# Patient Record
Sex: Male | Born: 1970 | Race: White | Hispanic: No | Marital: Married | Smoking: Former smoker
Health system: Southern US, Community
[De-identification: ages and names within clinical notes are randomized; demographics above are authoritative.]

## PROBLEM LIST (undated history)

## (undated) DIAGNOSIS — F1011 Alcohol abuse, in remission: Secondary | ICD-10-CM

## (undated) DIAGNOSIS — K219 Gastro-esophageal reflux disease without esophagitis: Secondary | ICD-10-CM

## (undated) DIAGNOSIS — Z765 Malingerer [conscious simulation]: Secondary | ICD-10-CM

## (undated) DIAGNOSIS — R569 Unspecified convulsions: Secondary | ICD-10-CM

## (undated) DIAGNOSIS — R7989 Other specified abnormal findings of blood chemistry: Secondary | ICD-10-CM

## (undated) DIAGNOSIS — G8929 Other chronic pain: Secondary | ICD-10-CM

## (undated) DIAGNOSIS — M549 Dorsalgia, unspecified: Secondary | ICD-10-CM

## (undated) DIAGNOSIS — F319 Bipolar disorder, unspecified: Secondary | ICD-10-CM

## (undated) DIAGNOSIS — F1110 Opioid abuse, uncomplicated: Secondary | ICD-10-CM

## (undated) DIAGNOSIS — F1120 Opioid dependence, uncomplicated: Secondary | ICD-10-CM

## (undated) DIAGNOSIS — F4310 Post-traumatic stress disorder, unspecified: Secondary | ICD-10-CM

## (undated) DIAGNOSIS — I509 Heart failure, unspecified: Secondary | ICD-10-CM

## (undated) DIAGNOSIS — I10 Essential (primary) hypertension: Secondary | ICD-10-CM

## (undated) DIAGNOSIS — T401X1A Poisoning by heroin, accidental (unintentional), initial encounter: Secondary | ICD-10-CM

## (undated) DIAGNOSIS — F419 Anxiety disorder, unspecified: Secondary | ICD-10-CM

## (undated) DIAGNOSIS — F329 Major depressive disorder, single episode, unspecified: Secondary | ICD-10-CM

## (undated) DIAGNOSIS — B1920 Unspecified viral hepatitis C without hepatic coma: Secondary | ICD-10-CM

## (undated) DIAGNOSIS — I219 Acute myocardial infarction, unspecified: Secondary | ICD-10-CM

## (undated) DIAGNOSIS — F32A Depression, unspecified: Secondary | ICD-10-CM

## (undated) HISTORY — PX: APPENDECTOMY: SHX54

## (undated) HISTORY — PX: BACK SURGERY: SHX140

## (undated) HISTORY — PX: ABDOMINAL SURGERY: SHX537

---

## 2005-03-09 ENCOUNTER — Emergency Department (HOSPITAL_COMMUNITY): Admission: EM | Admit: 2005-03-09 | Discharge: 2005-03-10 | Payer: Self-pay | Admitting: Emergency Medicine

## 2008-01-22 ENCOUNTER — Emergency Department (HOSPITAL_COMMUNITY): Admission: EM | Admit: 2008-01-22 | Discharge: 2008-01-22 | Payer: Self-pay | Admitting: Emergency Medicine

## 2008-01-26 ENCOUNTER — Emergency Department (HOSPITAL_COMMUNITY): Admission: EM | Admit: 2008-01-26 | Discharge: 2008-01-26 | Payer: Self-pay | Admitting: Emergency Medicine

## 2008-02-14 ENCOUNTER — Emergency Department (HOSPITAL_COMMUNITY): Admission: EM | Admit: 2008-02-14 | Discharge: 2008-02-14 | Payer: Self-pay | Admitting: Emergency Medicine

## 2008-03-19 ENCOUNTER — Emergency Department (HOSPITAL_COMMUNITY): Admission: EM | Admit: 2008-03-19 | Discharge: 2008-03-19 | Payer: Self-pay | Admitting: Emergency Medicine

## 2008-04-26 ENCOUNTER — Emergency Department (HOSPITAL_COMMUNITY): Admission: EM | Admit: 2008-04-26 | Discharge: 2008-04-27 | Payer: Self-pay | Admitting: Emergency Medicine

## 2008-08-28 ENCOUNTER — Emergency Department (HOSPITAL_COMMUNITY): Admission: EM | Admit: 2008-08-28 | Discharge: 2008-08-28 | Payer: Self-pay | Admitting: Emergency Medicine

## 2009-01-05 ENCOUNTER — Emergency Department (HOSPITAL_COMMUNITY): Admission: EM | Admit: 2009-01-05 | Discharge: 2009-01-05 | Payer: Self-pay | Admitting: Emergency Medicine

## 2009-11-04 ENCOUNTER — Emergency Department (HOSPITAL_COMMUNITY): Admission: EM | Admit: 2009-11-04 | Discharge: 2009-11-05 | Payer: Self-pay | Admitting: Emergency Medicine

## 2009-11-18 ENCOUNTER — Emergency Department (HOSPITAL_COMMUNITY): Admission: EM | Admit: 2009-11-18 | Discharge: 2009-11-19 | Payer: Self-pay | Admitting: Emergency Medicine

## 2009-12-24 ENCOUNTER — Other Ambulatory Visit: Payer: Self-pay | Admitting: Emergency Medicine

## 2009-12-24 ENCOUNTER — Ambulatory Visit: Payer: Self-pay | Admitting: Psychiatry

## 2009-12-25 ENCOUNTER — Inpatient Hospital Stay (HOSPITAL_COMMUNITY): Admission: AD | Admit: 2009-12-25 | Discharge: 2010-01-06 | Payer: Self-pay | Admitting: Psychiatry

## 2010-02-11 ENCOUNTER — Emergency Department (HOSPITAL_COMMUNITY): Admission: EM | Admit: 2010-02-11 | Discharge: 2010-02-11 | Payer: Self-pay | Admitting: Emergency Medicine

## 2010-02-12 ENCOUNTER — Ambulatory Visit: Payer: Self-pay | Admitting: Psychiatry

## 2010-02-12 ENCOUNTER — Inpatient Hospital Stay (HOSPITAL_COMMUNITY): Admission: RE | Admit: 2010-02-12 | Discharge: 2010-02-13 | Payer: Self-pay | Admitting: Psychiatry

## 2010-02-13 ENCOUNTER — Ambulatory Visit: Payer: Self-pay | Admitting: Psychiatry

## 2010-02-13 ENCOUNTER — Inpatient Hospital Stay (HOSPITAL_COMMUNITY): Admission: EM | Admit: 2010-02-13 | Discharge: 2010-02-13 | Payer: Self-pay | Admitting: Emergency Medicine

## 2010-02-23 ENCOUNTER — Emergency Department (HOSPITAL_COMMUNITY): Admission: EM | Admit: 2010-02-23 | Discharge: 2010-02-23 | Payer: Self-pay | Admitting: Emergency Medicine

## 2010-02-25 ENCOUNTER — Emergency Department (HOSPITAL_COMMUNITY): Admission: EM | Admit: 2010-02-25 | Discharge: 2010-02-25 | Payer: Self-pay | Admitting: Emergency Medicine

## 2010-03-28 ENCOUNTER — Emergency Department (HOSPITAL_COMMUNITY): Admission: EM | Admit: 2010-03-28 | Discharge: 2010-03-29 | Payer: Self-pay | Admitting: Emergency Medicine

## 2010-04-08 ENCOUNTER — Emergency Department (HOSPITAL_COMMUNITY): Admission: EM | Admit: 2010-04-08 | Discharge: 2010-04-08 | Payer: Self-pay | Admitting: Emergency Medicine

## 2010-04-26 ENCOUNTER — Emergency Department (HOSPITAL_COMMUNITY): Admission: EM | Admit: 2010-04-26 | Discharge: 2010-04-26 | Payer: Self-pay | Admitting: Emergency Medicine

## 2010-09-29 ENCOUNTER — Emergency Department (HOSPITAL_COMMUNITY)
Admission: EM | Admit: 2010-09-29 | Discharge: 2010-09-29 | Disposition: A | Discharge status: Home / Self Care | Payer: Medicare Other | Attending: Emergency Medicine | Admitting: Emergency Medicine

## 2010-09-29 ENCOUNTER — Emergency Department (HOSPITAL_COMMUNITY): Payer: Medicare Other

## 2010-09-29 DIAGNOSIS — Z9889 Other specified postprocedural states: Secondary | ICD-10-CM | POA: Insufficient documentation

## 2010-09-29 DIAGNOSIS — M545 Low back pain, unspecified: Secondary | ICD-10-CM | POA: Insufficient documentation

## 2010-09-29 DIAGNOSIS — S335XXA Sprain of ligaments of lumbar spine, initial encounter: Secondary | ICD-10-CM | POA: Insufficient documentation

## 2010-10-23 LAB — URINE MICROSCOPIC-ADD ON

## 2010-10-23 LAB — URINALYSIS, ROUTINE W REFLEX MICROSCOPIC
Bilirubin Urine: NEGATIVE
Glucose, UA: NEGATIVE mg/dL
Ketones, ur: NEGATIVE mg/dL
Leukocytes, UA: NEGATIVE
Nitrite: NEGATIVE
Protein, ur: 30 mg/dL — AB
Specific Gravity, Urine: 1.023 (ref 1.005–1.030)
Urobilinogen, UA: 0.2 mg/dL (ref 0.0–1.0)
pH: 5.5 (ref 5.0–8.0)

## 2010-10-25 LAB — DIFFERENTIAL
Basophils Absolute: 0 10*3/uL (ref 0.0–0.1)
Basophils Absolute: 0 10*3/uL (ref 0.0–0.1)
Basophils Absolute: 0 10*3/uL (ref 0.0–0.1)
Basophils Relative: 1 % (ref 0–1)
Basophils Relative: 1 % (ref 0–1)
Basophils Relative: 1 % (ref 0–1)
Eosinophils Absolute: 0.2 10*3/uL (ref 0.0–0.7)
Eosinophils Absolute: 0.2 10*3/uL (ref 0.0–0.7)
Eosinophils Absolute: 0.2 10*3/uL (ref 0.0–0.7)
Eosinophils Relative: 2 % (ref 0–5)
Eosinophils Relative: 2 % (ref 0–5)
Eosinophils Relative: 2 % (ref 0–5)
Lymphocytes Relative: 27 % (ref 12–46)
Lymphocytes Relative: 28 % (ref 12–46)
Lymphocytes Relative: 35 % (ref 12–46)
Lymphs Abs: 2 10*3/uL (ref 0.7–4.0)
Lymphs Abs: 2.2 10*3/uL (ref 0.7–4.0)
Lymphs Abs: 2.4 10*3/uL (ref 0.7–4.0)
Monocytes Absolute: 0.4 10*3/uL (ref 0.1–1.0)
Monocytes Absolute: 0.5 10*3/uL (ref 0.1–1.0)
Monocytes Absolute: 0.6 10*3/uL (ref 0.1–1.0)
Monocytes Relative: 6 % (ref 3–12)
Monocytes Relative: 7 % (ref 3–12)
Monocytes Relative: 7 % (ref 3–12)
Neutro Abs: 4 10*3/uL (ref 1.7–7.7)
Neutro Abs: 4.7 10*3/uL (ref 1.7–7.7)
Neutro Abs: 4.9 10*3/uL (ref 1.7–7.7)
Neutrophils Relative %: 57 % (ref 43–77)
Neutrophils Relative %: 62 % (ref 43–77)
Neutrophils Relative %: 64 % (ref 43–77)

## 2010-10-25 LAB — COMPREHENSIVE METABOLIC PANEL
ALT: 24 U/L (ref 0–53)
AST: 17 U/L (ref 0–37)
Albumin: 3.5 g/dL (ref 3.5–5.2)
Alkaline Phosphatase: 66 U/L (ref 39–117)
BUN: 15 mg/dL (ref 6–23)
CO2: 26 mEq/L (ref 19–32)
Calcium: 8.7 mg/dL (ref 8.4–10.5)
Chloride: 108 mEq/L (ref 96–112)
Creatinine, Ser: 0.94 mg/dL (ref 0.4–1.5)
GFR calc Af Amer: >60 mL/min (ref >60)
GFR calc non Af Amer: >60 mL/min (ref >60)
Glucose, Bld: 111 mg/dL — ABNORMAL HIGH (ref 70–99)
Potassium: 3.7 mEq/L (ref 3.5–5.1)
Sodium: 141 mEq/L (ref 135–145)
Total Bilirubin: 0.3 mg/dL (ref 0.3–1.2)
Total Protein: 6.3 g/dL (ref 6.0–8.3)

## 2010-10-25 LAB — BASIC METABOLIC PANEL
BUN: 15 mg/dL (ref 6–23)
CO2: 27 mEq/L (ref 19–32)
Calcium: 8.6 mg/dL (ref 8.4–10.5)
Chloride: 110 mEq/L (ref 96–112)
Creatinine, Ser: 0.97 mg/dL (ref 0.4–1.5)
GFR calc Af Amer: >60 mL/min (ref >60)
GFR calc non Af Amer: >60 mL/min (ref >60)
Glucose, Bld: 99 mg/dL (ref 70–99)
Potassium: 4 mEq/L (ref 3.5–5.1)
Sodium: 141 mEq/L (ref 135–145)

## 2010-10-25 LAB — URINALYSIS, ROUTINE W REFLEX MICROSCOPIC
Bilirubin Urine: NEGATIVE
Bilirubin Urine: NEGATIVE
Glucose, UA: NEGATIVE mg/dL
Glucose, UA: NEGATIVE mg/dL
Hgb urine dipstick: NEGATIVE
Hgb urine dipstick: NEGATIVE
Ketones, ur: NEGATIVE mg/dL
Ketones, ur: NEGATIVE mg/dL
Nitrite: NEGATIVE
Nitrite: NEGATIVE
Protein, ur: NEGATIVE mg/dL
Protein, ur: NEGATIVE mg/dL
Specific Gravity, Urine: 1.012 (ref 1.005–1.030)
Specific Gravity, Urine: 1.041 — ABNORMAL HIGH (ref 1.005–1.030)
Urobilinogen, UA: 0.2 mg/dL (ref 0.0–1.0)
Urobilinogen, UA: 0.2 mg/dL (ref 0.0–1.0)
pH: 5.5 (ref 5.0–8.0)
pH: 7 (ref 5.0–8.0)

## 2010-10-25 LAB — RAPID URINE DRUG SCREEN, HOSP PERFORMED
Amphetamines: NOT DETECTED
Amphetamines: NOT DETECTED
Barbiturates: POSITIVE — AB
Barbiturates: POSITIVE — AB
Benzodiazepines: POSITIVE — AB
Benzodiazepines: POSITIVE — AB
Cocaine: NOT DETECTED
Cocaine: NOT DETECTED
Opiates: POSITIVE — AB
Opiates: POSITIVE — AB
Tetrahydrocannabinol: NOT DETECTED
Tetrahydrocannabinol: NOT DETECTED

## 2010-10-25 LAB — CBC
HCT: 36.2 % — ABNORMAL LOW (ref 39.0–52.0)
HCT: 37.1 % — ABNORMAL LOW (ref 39.0–52.0)
HCT: 37.3 % — ABNORMAL LOW (ref 39.0–52.0)
Hemoglobin: 12.4 g/dL — ABNORMAL LOW (ref 13.0–17.0)
Hemoglobin: 12.9 g/dL — ABNORMAL LOW (ref 13.0–17.0)
Hemoglobin: 13 g/dL (ref 13.0–17.0)
MCH: 30.3 pg (ref 26.0–34.0)
MCH: 30.7 pg (ref 26.0–34.0)
MCH: 30.8 pg (ref 26.0–34.0)
MCHC: 34.1 g/dL (ref 30.0–36.0)
MCHC: 34.7 g/dL (ref 30.0–36.0)
MCHC: 34.8 g/dL (ref 30.0–36.0)
MCV: 88.4 fL (ref 78.0–100.0)
MCV: 88.4 fL (ref 78.0–100.0)
MCV: 88.8 fL (ref 78.0–100.0)
Platelets: 197 10*3/uL (ref 150–400)
Platelets: 204 10*3/uL (ref 150–400)
Platelets: 216 10*3/uL (ref 150–400)
RBC: 4.08 MIL/uL — ABNORMAL LOW (ref 4.22–5.81)
RBC: 4.19 MIL/uL — ABNORMAL LOW (ref 4.22–5.81)
RBC: 4.22 MIL/uL (ref 4.22–5.81)
RDW: 13 % (ref 11.5–15.5)
RDW: 13.3 % (ref 11.5–15.5)
RDW: 13.4 % (ref 11.5–15.5)
WBC: 7 10*3/uL (ref 4.0–10.5)
WBC: 7.4 10*3/uL (ref 4.0–10.5)
WBC: 7.9 10*3/uL (ref 4.0–10.5)

## 2010-10-25 LAB — TSH: TSH: 3.354 u[IU]/mL (ref 0.350–4.500)

## 2010-10-25 LAB — SALICYLATE LEVEL: Salicylate Lvl: <4 mg/dL (ref 2.8–20.0)

## 2010-10-25 LAB — POCT I-STAT, CHEM 8
BUN: 11 mg/dL (ref 6–23)
Calcium, Ion: 1.18 mmol/L (ref 1.12–1.32)
Chloride: 109 mEq/L (ref 96–112)
Creatinine, Ser: 1 mg/dL (ref 0.4–1.5)
Glucose, Bld: 103 mg/dL — ABNORMAL HIGH (ref 70–99)
HCT: 38 % — ABNORMAL LOW (ref 39.0–52.0)
Hemoglobin: 12.9 g/dL — ABNORMAL LOW (ref 13.0–17.0)
Potassium: 3.5 mEq/L (ref 3.5–5.1)
Sodium: 143 mEq/L (ref 135–145)
TCO2: 23 mmol/L (ref 0–100)

## 2010-10-25 LAB — PROTIME-INR
INR: 1.06 (ref 0.00–1.49)
Prothrombin Time: 13.7 seconds (ref 11.6–15.2)

## 2010-10-25 LAB — SAMPLE TO BLOOD BANK

## 2010-10-25 LAB — MAGNESIUM: Magnesium: 2 mg/dL (ref 1.5–2.5)

## 2010-10-25 LAB — PHOSPHORUS: Phosphorus: 4 mg/dL (ref 2.3–4.6)

## 2010-10-25 LAB — T3: T3, Total: 106.7 ng/dl (ref 80.0–204.0)

## 2010-10-25 LAB — T4, FREE: Free T4: 1.11 ng/dL (ref 0.80–1.80)

## 2010-10-25 LAB — APTT: aPTT: 33 seconds (ref 24–37)

## 2010-10-25 LAB — ETHANOL
Alcohol, Ethyl (B): <5 mg/dL (ref 0–10)
Alcohol, Ethyl (B): <5 mg/dL (ref 0–10)

## 2010-10-25 LAB — ACETAMINOPHEN LEVEL: Acetaminophen (Tylenol), Serum: <10 ug/mL — ABNORMAL LOW (ref 10–30)

## 2010-10-26 LAB — COMPREHENSIVE METABOLIC PANEL
ALT: 31 U/L (ref 0–53)
AST: 22 U/L (ref 0–37)
Albumin: 3.7 g/dL (ref 3.5–5.2)
Alkaline Phosphatase: 66 U/L (ref 39–117)
BUN: 12 mg/dL (ref 6–23)
CO2: 27 mEq/L (ref 19–32)
Calcium: 8.7 mg/dL (ref 8.4–10.5)
Chloride: 105 mEq/L (ref 96–112)
Creatinine, Ser: 0.78 mg/dL (ref 0.4–1.5)
GFR calc Af Amer: >60 mL/min (ref >60)
GFR calc non Af Amer: >60 mL/min (ref >60)
Glucose, Bld: 127 mg/dL — ABNORMAL HIGH (ref 70–99)
Potassium: 3.7 mEq/L (ref 3.5–5.1)
Sodium: 139 mEq/L (ref 135–145)
Total Bilirubin: 1.1 mg/dL (ref 0.3–1.2)
Total Protein: 6.4 g/dL (ref 6.0–8.3)

## 2010-10-26 LAB — DIFFERENTIAL
Basophils Absolute: 0 10*3/uL (ref 0.0–0.1)
Basophils Relative: 0 % (ref 0–1)
Eosinophils Absolute: 0.2 10*3/uL (ref 0.0–0.7)
Eosinophils Relative: 3 % (ref 0–5)
Lymphocytes Relative: 31 % (ref 12–46)
Lymphs Abs: 2.5 10*3/uL (ref 0.7–4.0)
Monocytes Absolute: 0.5 10*3/uL (ref 0.1–1.0)
Monocytes Relative: 6 % (ref 3–12)
Neutro Abs: 4.9 10*3/uL (ref 1.7–7.7)
Neutrophils Relative %: 60 % (ref 43–77)

## 2010-10-26 LAB — RAPID URINE DRUG SCREEN, HOSP PERFORMED
Amphetamines: NOT DETECTED
Barbiturates: NOT DETECTED
Benzodiazepines: NOT DETECTED
Cocaine: NOT DETECTED
Opiates: POSITIVE — AB
Tetrahydrocannabinol: POSITIVE — AB

## 2010-10-26 LAB — BASIC METABOLIC PANEL
BUN: 12 mg/dL (ref 6–23)
CO2: 29 mEq/L (ref 19–32)
Calcium: 8.7 mg/dL (ref 8.4–10.5)
Chloride: 103 mEq/L (ref 96–112)
Creatinine, Ser: 0.72 mg/dL (ref 0.4–1.5)
GFR calc Af Amer: >60 mL/min (ref >60)
GFR calc non Af Amer: >60 mL/min (ref >60)
Glucose, Bld: 198 mg/dL — ABNORMAL HIGH (ref 70–99)
Potassium: 4.1 mEq/L (ref 3.5–5.1)
Sodium: 139 mEq/L (ref 135–145)

## 2010-10-26 LAB — CBC
HCT: 40.8 % (ref 39.0–52.0)
Hemoglobin: 14.3 g/dL (ref 13.0–17.0)
MCHC: 35.2 g/dL (ref 30.0–36.0)
MCV: 89.7 fL (ref 78.0–100.0)
Platelets: 209 10*3/uL (ref 150–400)
RBC: 4.54 MIL/uL (ref 4.22–5.81)
RDW: 13.5 % (ref 11.5–15.5)
WBC: 8.1 10*3/uL (ref 4.0–10.5)

## 2010-10-26 LAB — ACETAMINOPHEN LEVEL: Acetaminophen (Tylenol), Serum: <10 ug/mL — ABNORMAL LOW (ref 10–30)

## 2010-10-26 LAB — VALPROIC ACID LEVEL: Valproic Acid Lvl: 67.8 ug/mL (ref 50.0–100.0)

## 2010-10-26 LAB — TSH: TSH: 1.779 u[IU]/mL (ref 0.350–4.500)

## 2010-10-26 LAB — ETHANOL: Alcohol, Ethyl (B): <5 mg/dL (ref 0–10)

## 2010-10-28 LAB — URINALYSIS, ROUTINE W REFLEX MICROSCOPIC
Bilirubin Urine: NEGATIVE
Glucose, UA: NEGATIVE mg/dL
Hgb urine dipstick: NEGATIVE
Ketones, ur: NEGATIVE mg/dL
Nitrite: NEGATIVE
Protein, ur: NEGATIVE mg/dL
Specific Gravity, Urine: 1.017 (ref 1.005–1.030)
Urobilinogen, UA: 0.2 mg/dL (ref 0.0–1.0)
pH: 7.5 (ref 5.0–8.0)

## 2010-10-28 LAB — POCT I-STAT, CHEM 8
BUN: 12 mg/dL (ref 6–23)
Calcium, Ion: 1.11 mmol/L — ABNORMAL LOW (ref 1.12–1.32)
Chloride: 105 mEq/L (ref 96–112)
Creatinine, Ser: 0.6 mg/dL (ref 0.4–1.5)
Glucose, Bld: 103 mg/dL — ABNORMAL HIGH (ref 70–99)
HCT: 44 % (ref 39.0–52.0)
Hemoglobin: 15 g/dL (ref 13.0–17.0)
Potassium: 3.6 mEq/L (ref 3.5–5.1)
Sodium: 140 mEq/L (ref 135–145)
TCO2: 26 mmol/L (ref 0–100)

## 2010-11-01 LAB — COMPREHENSIVE METABOLIC PANEL
ALT: 22 U/L (ref 0–53)
AST: 18 U/L (ref 0–37)
Albumin: 3.4 g/dL — ABNORMAL LOW (ref 3.5–5.2)
Alkaline Phosphatase: 62 U/L (ref 39–117)
BUN: 7 mg/dL (ref 6–23)
CO2: 27 mEq/L (ref 19–32)
Calcium: 8.4 mg/dL (ref 8.4–10.5)
Chloride: 100 mEq/L (ref 96–112)
Creatinine, Ser: 0.73 mg/dL (ref 0.4–1.5)
GFR calc Af Amer: >60 mL/min (ref >60)
GFR calc non Af Amer: >60 mL/min (ref >60)
Glucose, Bld: 134 mg/dL — ABNORMAL HIGH (ref 70–99)
Potassium: 3 mEq/L — ABNORMAL LOW (ref 3.5–5.1)
Sodium: 134 mEq/L — ABNORMAL LOW (ref 135–145)
Total Bilirubin: 2.1 mg/dL — ABNORMAL HIGH (ref 0.3–1.2)
Total Protein: 6.6 g/dL (ref 6.0–8.3)

## 2010-11-01 LAB — URINALYSIS, ROUTINE W REFLEX MICROSCOPIC
Bilirubin Urine: NEGATIVE
Glucose, UA: NEGATIVE mg/dL
Hgb urine dipstick: NEGATIVE
Ketones, ur: NEGATIVE mg/dL
Leukocytes, UA: NEGATIVE
Nitrite: NEGATIVE
Protein, ur: 30 mg/dL — AB
Specific Gravity, Urine: 1.02 (ref 1.005–1.030)
Urobilinogen, UA: 0.2 mg/dL (ref 0.0–1.0)
pH: 6 (ref 5.0–8.0)

## 2010-11-01 LAB — DIFFERENTIAL
Basophils Absolute: 0 10*3/uL (ref 0.0–0.1)
Basophils Relative: 0 % (ref 0–1)
Eosinophils Absolute: 0.1 10*3/uL (ref 0.0–0.7)
Eosinophils Relative: 1 % (ref 0–5)
Lymphocytes Relative: 16 % (ref 12–46)
Lymphs Abs: 2 10*3/uL (ref 0.7–4.0)
Monocytes Absolute: 0.7 10*3/uL (ref 0.1–1.0)
Monocytes Relative: 6 % (ref 3–12)
Neutro Abs: 9.7 10*3/uL — ABNORMAL HIGH (ref 1.7–7.7)
Neutrophils Relative %: 78 % — ABNORMAL HIGH (ref 43–77)

## 2010-11-01 LAB — URINE MICROSCOPIC-ADD ON

## 2010-11-01 LAB — CBC
HCT: 40.6 % (ref 39.0–52.0)
Hemoglobin: 14.1 g/dL (ref 13.0–17.0)
MCHC: 34.7 g/dL (ref 30.0–36.0)
MCV: 89.2 fL (ref 78.0–100.0)
Platelets: 161 10*3/uL (ref 150–400)
RBC: 4.55 MIL/uL (ref 4.22–5.81)
RDW: 13.4 % (ref 11.5–15.5)
WBC: 12.5 10*3/uL — ABNORMAL HIGH (ref 4.0–10.5)

## 2010-12-15 ENCOUNTER — Emergency Department (HOSPITAL_COMMUNITY)
Admission: EM | Admit: 2010-12-15 | Discharge: 2010-12-15 | Discharge status: Against Medical Advice | Payer: Medicare Other | Attending: Emergency Medicine | Admitting: Emergency Medicine

## 2010-12-15 DIAGNOSIS — R509 Fever, unspecified: Secondary | ICD-10-CM | POA: Insufficient documentation

## 2010-12-15 DIAGNOSIS — K089 Disorder of teeth and supporting structures, unspecified: Secondary | ICD-10-CM | POA: Insufficient documentation

## 2011-03-24 ENCOUNTER — Emergency Department (HOSPITAL_COMMUNITY): Payer: Medicare Other

## 2011-03-24 ENCOUNTER — Emergency Department (HOSPITAL_COMMUNITY)
Admission: EM | Admit: 2011-03-24 | Discharge: 2011-03-24 | Disposition: A | Discharge status: Home / Self Care | Payer: Medicare Other | Attending: Emergency Medicine | Admitting: Emergency Medicine

## 2011-03-24 DIAGNOSIS — M545 Low back pain, unspecified: Secondary | ICD-10-CM | POA: Insufficient documentation

## 2011-03-24 DIAGNOSIS — IMO0002 Reserved for concepts with insufficient information to code with codable children: Secondary | ICD-10-CM | POA: Insufficient documentation

## 2011-03-24 DIAGNOSIS — M546 Pain in thoracic spine: Secondary | ICD-10-CM | POA: Insufficient documentation

## 2011-03-24 DIAGNOSIS — M5380 Other specified dorsopathies, site unspecified: Secondary | ICD-10-CM | POA: Insufficient documentation

## 2011-03-24 DIAGNOSIS — M5126 Other intervertebral disc displacement, lumbar region: Secondary | ICD-10-CM | POA: Insufficient documentation

## 2011-03-24 DIAGNOSIS — Z79899 Other long term (current) drug therapy: Secondary | ICD-10-CM | POA: Insufficient documentation

## 2011-03-24 DIAGNOSIS — S335XXA Sprain of ligaments of lumbar spine, initial encounter: Secondary | ICD-10-CM | POA: Insufficient documentation

## 2011-03-24 DIAGNOSIS — Y92410 Unspecified street and highway as the place of occurrence of the external cause: Secondary | ICD-10-CM | POA: Insufficient documentation

## 2011-03-24 DIAGNOSIS — R209 Unspecified disturbances of skin sensation: Secondary | ICD-10-CM | POA: Insufficient documentation

## 2011-03-24 DIAGNOSIS — M542 Cervicalgia: Secondary | ICD-10-CM | POA: Insufficient documentation

## 2011-03-24 DIAGNOSIS — Z9889 Other specified postprocedural states: Secondary | ICD-10-CM | POA: Insufficient documentation

## 2011-03-24 DIAGNOSIS — X500XXA Overexertion from strenuous movement or load, initial encounter: Secondary | ICD-10-CM | POA: Insufficient documentation

## 2011-05-06 LAB — CBC
HCT: 44.3
Hemoglobin: 15.4
MCHC: 34.8
MCV: 90.3
Platelets: 258
RBC: 4.91
RDW: 13.5
WBC: 7.9

## 2011-05-06 LAB — BASIC METABOLIC PANEL
BUN: 11
CO2: 25
Calcium: 9.3
Chloride: 107
Creatinine, Ser: 0.83
GFR calc Af Amer: >60
GFR calc non Af Amer: >60
Glucose, Bld: 104 — ABNORMAL HIGH
Potassium: 4.1
Sodium: 140

## 2011-05-06 LAB — SAMPLE TO BLOOD BANK

## 2011-05-06 LAB — URINALYSIS, ROUTINE W REFLEX MICROSCOPIC
Bilirubin Urine: NEGATIVE
Glucose, UA: NEGATIVE
Hgb urine dipstick: NEGATIVE
Ketones, ur: NEGATIVE
Nitrite: NEGATIVE
Protein, ur: NEGATIVE
Specific Gravity, Urine: 1.019
Urobilinogen, UA: 0.2
pH: 7.5

## 2011-05-06 LAB — DIFFERENTIAL
Basophils Absolute: 0
Basophils Relative: 0
Eosinophils Absolute: 0.1
Eosinophils Relative: 2
Lymphocytes Relative: 23
Lymphs Abs: 1.8
Monocytes Absolute: 0.6
Monocytes Relative: 7
Neutro Abs: 5.4
Neutrophils Relative %: 69

## 2011-05-07 LAB — DIFFERENTIAL
Basophils Absolute: 0
Basophils Relative: 1
Eosinophils Absolute: 0.2
Eosinophils Relative: 2
Lymphocytes Relative: 33
Lymphs Abs: 2.4
Monocytes Absolute: 0.6
Monocytes Relative: 8
Neutro Abs: 4.1
Neutrophils Relative %: 56

## 2011-05-07 LAB — CBC
HCT: 43.5
Hemoglobin: 15.4
MCHC: 35.5
MCV: 90.6
Platelets: 218
RBC: 4.8
RDW: 13.8
WBC: 7.3

## 2011-05-07 LAB — POCT I-STAT, CHEM 8
BUN: 15
Calcium, Ion: 1.11 — ABNORMAL LOW
Chloride: 107
Creatinine, Ser: 0.9
Glucose, Bld: 118 — ABNORMAL HIGH
HCT: 44
Hemoglobin: 15
Potassium: 3.5
Sodium: 140
TCO2: 22

## 2011-05-07 LAB — URINE CULTURE
Colony Count: NO GROWTH
Culture: NO GROWTH

## 2011-05-07 LAB — URINALYSIS, ROUTINE W REFLEX MICROSCOPIC
Bilirubin Urine: NEGATIVE
Glucose, UA: NEGATIVE
Ketones, ur: NEGATIVE
Leukocytes, UA: NEGATIVE
Nitrite: NEGATIVE
Protein, ur: NEGATIVE
Specific Gravity, Urine: 1.027
Urobilinogen, UA: 0.2
pH: 5.5

## 2011-05-07 LAB — URINE MICROSCOPIC-ADD ON

## 2011-05-07 LAB — PROTIME-INR
INR: 1
Prothrombin Time: 13

## 2011-05-07 LAB — APTT: aPTT: 31

## 2011-05-10 LAB — URINE MICROSCOPIC-ADD ON

## 2011-05-10 LAB — URINALYSIS, ROUTINE W REFLEX MICROSCOPIC
Bilirubin Urine: NEGATIVE
Glucose, UA: NEGATIVE
Hgb urine dipstick: NEGATIVE
Ketones, ur: NEGATIVE
Leukocytes, UA: NEGATIVE
Nitrite: NEGATIVE
Protein, ur: 30 — AB
Specific Gravity, Urine: 1.025
Urobilinogen, UA: 1
pH: 8.5 — ABNORMAL HIGH

## 2012-04-14 ENCOUNTER — Emergency Department (HOSPITAL_BASED_OUTPATIENT_CLINIC_OR_DEPARTMENT_OTHER)
Admission: EM | Admit: 2012-04-14 | Discharge: 2012-04-14 | Disposition: A | Discharge status: Home / Self Care | Payer: Medicare Other | Attending: Emergency Medicine | Admitting: Emergency Medicine

## 2012-04-14 ENCOUNTER — Encounter (HOSPITAL_BASED_OUTPATIENT_CLINIC_OR_DEPARTMENT_OTHER): Payer: Self-pay | Admitting: *Deleted

## 2012-04-14 DIAGNOSIS — K044 Acute apical periodontitis of pulpal origin: Secondary | ICD-10-CM | POA: Insufficient documentation

## 2012-04-14 DIAGNOSIS — M549 Dorsalgia, unspecified: Secondary | ICD-10-CM | POA: Insufficient documentation

## 2012-04-14 DIAGNOSIS — K0889 Other specified disorders of teeth and supporting structures: Secondary | ICD-10-CM

## 2012-04-14 DIAGNOSIS — F17200 Nicotine dependence, unspecified, uncomplicated: Secondary | ICD-10-CM | POA: Insufficient documentation

## 2012-04-14 DIAGNOSIS — K047 Periapical abscess without sinus: Secondary | ICD-10-CM

## 2012-04-14 DIAGNOSIS — G8929 Other chronic pain: Secondary | ICD-10-CM

## 2012-04-14 HISTORY — DX: Unspecified convulsions: R56.9

## 2012-04-14 HISTORY — DX: Dorsalgia, unspecified: M54.9

## 2012-04-14 MED ORDER — PENICILLIN V POTASSIUM 500 MG PO TABS: 500.0000 mg | ORAL_TABLET | Freq: Four times a day (QID) | ORAL | Status: AC

## 2012-04-14 MED ORDER — TRAMADOL HCL 50 MG PO TABS: 100.0000 mg | ORAL_TABLET | Freq: Four times a day (QID) | ORAL | Status: AC | PRN

## 2012-04-14 NOTE — ED Notes (Signed)
Pt c/o tooth ache x 4 days and also c/o chronic back pain , pt is resident at [facility] rehab

## 2012-04-14 NOTE — ED Provider Notes (Signed)
History     CSN: [id number]  Arrival date & time 04/14/12  1334   First MD Initiated Contact with Patient 04/14/12 1424      Chief Complaint  Patient presents with  . Dental Pain  . Back Pain    (Consider location/radiation/quality/duration/timing/severity/associated sxs/prior treatment) Patient is a 41 y.o. male presenting with tooth pain and back pain. The history is provided by the patient and medical records.  Dental PainPrimary symptoms do not include headaches, fever or shortness of breath.  Additional symptoms do not include: ear pain and fatigue.   Back Pain  Pertinent negatives include no chest pain, no fever, no numbness, no headaches, no abdominal pain, no dysuria and no weakness.   Deryl Giroux is a 41 y.o. male presents to the emergency department complaining of dental pain and back pain.  The onset of the symptoms was  gradual starting 3 months ago.  The patient has associated numbness and tingling.  The symptoms have been  persistent, gradually worsened.  Nothing makes the symptoms worse and nothing makes symptoms better.  The patient denies fever, chills, headache, chest pain, shortness of breath.  Pt states broken teeth from grinding his teeth and an open and draining ulcer in his mouth.  Pt states chronic pain in his back previously treated with neurontin and tramadol.  Pt with hx of narcotic abuse.  States symptoms of his back are stable and are no different today.  Hx of laminectomy in the L-spine.  Pt denies difficulty ambulating (though he sometimes shuffles from the pain), or weakness in his legs.  Also denies loss of bowel/bladder function, saddle anesthesia.  Surface paresthesias in the R leg are constant and chronic.     Past Medical History  Diagnosis Date  . Back pain   . Seizures     Past Surgical History  Procedure Date  . Back surgery   . Appendectomy   . Abdominal surgery     History reviewed. No pertinent family history.  History  Substance  Use Topics  . Smoking status: Current Everyday Smoker -- 0.5 packs/day  . Smokeless tobacco: Not on file  . Alcohol Use: No      Review of Systems  Constitutional: Negative for fever and fatigue.  HENT: Positive for dental problem. Negative for ear pain, congestion, neck pain, neck stiffness and sinus pressure.   Respiratory: Negative for chest tightness and shortness of breath.   Cardiovascular: Negative for chest pain.  Gastrointestinal: Negative for nausea, vomiting, abdominal pain and diarrhea.  Genitourinary: Negative for dysuria, urgency, frequency and hematuria.  Musculoskeletal: Positive for back pain and gait problem (shuffling 2/2 pain). Negative for joint swelling.  Skin: Negative for rash.  Neurological: Negative for weakness, light-headedness, numbness and headaches.    Allergies  Benadryl; Prednisone; and Nsaids  Home Medications   Current Outpatient Rx  Name Route Sig Dispense Refill  . ASENAPINE MALEATE 5 MG SL SUBL Sublingual Place 5 mg under the tongue 2 (two) times daily.    Marland Kitchen CITALOPRAM HYDROBROMIDE 20 MG PO TABS Oral Take 20 mg by mouth daily.    Marland Kitchen PHENYTOIN SODIUM EXTENDED 100 MG PO CAPS Oral Take 100 mg by mouth 3 (three) times daily.     . TRAZODONE HCL 100 MG PO TABS Oral Take 100 mg by mouth at bedtime.    Marland Kitchen PENICILLIN V POTASSIUM 500 MG PO TABS Oral Take 1 tablet (500 mg total) by mouth 4 (four) times daily. 40 tablet 0  .  TRAMADOL HCL 50 MG PO TABS Oral Take 2 tablets (100 mg total) by mouth every 6 (six) hours as needed for pain. 15 tablet 0    BP 135/79  Pulse 83  Temp 99 F (37.2 C) (Oral)  Resp 18  Ht 6' (1.829 m)  Wt 265 lb (120.203 kg)  BMI 35.94 kg/m2  SpO2 96%  Physical Exam  Nursing note and vitals reviewed. Constitutional: He appears well-developed and well-nourished. No distress.  HENT:  Head: Normocephalic and atraumatic.  Right Ear: Tympanic membrane, external ear and ear canal normal.  Left Ear: Tympanic membrane, external  ear and ear canal normal.  Nose: Nose normal.  Mouth/Throat: Uvula is midline, oropharynx is clear and moist and mucous membranes are normal. No oropharyngeal exudate.         Many broken and missing teeth with caries in the upper and lower jaw.  Eyes: Conjunctivae are normal. No scleral icterus.  Neck: Normal range of motion. Neck supple.  Cardiovascular: Normal rate, regular rhythm, normal heart sounds and intact distal pulses.  Exam reveals no gallop and no friction rub.   No murmur heard. Pulmonary/Chest: Effort normal and breath sounds normal. No respiratory distress. He has no wheezes.  Abdominal: Soft. Bowel sounds are normal. He exhibits no mass. There is no tenderness. There is no rebound and no guarding.  Musculoskeletal: He exhibits no edema.       Lumbar back: He exhibits decreased range of motion, tenderness and pain. He exhibits no bony tenderness, no swelling, no edema, no deformity, no laceration and no spasm.       Decreased ROM when bending secondary to pain Full ROM in all other joints  Neurological: He is alert. He has normal strength and normal reflexes. No cranial nerve deficit or sensory deficit. He exhibits normal muscle tone. GCS eye subscore is 4. GCS verbal subscore is 5. GCS motor subscore is 6.  Reflex Scores:      Patellar reflexes are 2+ on the right side and 2+ on the left side.      Achilles reflexes are 2+ on the right side and 2+ on the left side.      Speech is clear and goal oriented, follows commands Normal strength in upper and lower extremities bilaterally, strong and equal grip strength Sensation normal to light and sharp touch Moves extremities without ataxia, coordination intact Normal balance Normal gait DTR's normal   Skin: Skin is warm and dry. No rash noted. He is not diaphoretic.  Psychiatric: He has a normal mood and affect.    ED Course  Procedures (including critical care time)  Labs Reviewed - No data to display No results  found.   1. Tooth pain   2. Tooth infection   3. Back pain, chronic       MDM  [name] presents with chronic back pain and teeth pain x4 days.  Concern for infection of right upper molar.  Will treat with penicillin.  No neurological deficits and normal neuro exam.  Patient can walk but states is painful.  No loss of bowel or bladder control.  No concern for cauda equina.  No fever, night sweats, weight loss, h/o cancer, IVDU.  No change in the pain recently.  RICE protocol and pain medicine indicated and discussed with patient. Patient states tramadol works for back pain in the past. Will give one week prescription. I discussed with him the importance of finding a dentist to repair teeth. Also discussed the need  for primary care physician as the ER is going to be unable to continue to write pain medication for him. I have also discussed reasons to return immediately to the ER.  Patient expresses understanding and agrees with plan.  1. Medications: Penicillin VK, tramadol 2. Treatment: Rest heat, swish with warm salt water, stretch back 3. Follow Up: With primary care physician and dentist.           Dierdre Forth, PA-C 04/14/12 1527

## 2012-04-14 NOTE — ED Notes (Signed)
Pt in narcotic rehab program at present time.

## 2012-04-14 NOTE — ED Provider Notes (Signed)
Medical screening examination/treatment/procedure(s) were performed by non-physician practitioner and as supervising physician I was immediately available for consultation/collaboration.  Jones Skene, M.D.     Jones Skene, MD 04/14/12 2153

## 2012-04-29 ENCOUNTER — Emergency Department (HOSPITAL_BASED_OUTPATIENT_CLINIC_OR_DEPARTMENT_OTHER)
Admission: EM | Admit: 2012-04-29 | Discharge: 2012-04-29 | Disposition: A | Discharge status: Home / Self Care | Payer: Medicare Other | Attending: Emergency Medicine | Admitting: Emergency Medicine

## 2012-04-29 ENCOUNTER — Encounter (HOSPITAL_BASED_OUTPATIENT_CLINIC_OR_DEPARTMENT_OTHER): Payer: Self-pay | Admitting: *Deleted

## 2012-04-29 DIAGNOSIS — M545 Low back pain, unspecified: Secondary | ICD-10-CM | POA: Insufficient documentation

## 2012-04-29 DIAGNOSIS — F17200 Nicotine dependence, unspecified, uncomplicated: Secondary | ICD-10-CM | POA: Insufficient documentation

## 2012-04-29 DIAGNOSIS — G8929 Other chronic pain: Secondary | ICD-10-CM | POA: Insufficient documentation

## 2012-04-29 MED ORDER — KETOROLAC TROMETHAMINE 60 MG/2ML IM SOLN
60.0000 mg | Freq: Once | INTRAMUSCULAR | Status: AC
  Administered 2012-04-29: 60 mg via INTRAMUSCULAR
  Filled 2012-04-29: qty 2

## 2012-04-29 MED ORDER — TRAMADOL HCL 50 MG PO TABS: ORAL_TABLET | ORAL | Status: DC

## 2012-04-29 NOTE — ED Provider Notes (Signed)
History     CSN: [id number]  Arrival date & time 04/29/12  1315   First MD Initiated Contact with Patient 04/29/12 1459      Chief Complaint  Patient presents with  . Headache    (Consider location/radiation/quality/duration/timing/severity/associated sxs/prior treatment) HPI Comments: Pt has chronic low back pain.  Is currently in rehab for narcotic abuse.  Taking tramadol.  Does not want narcotics but wants something more for pain.  Patient is a 41 y.o. male presenting with back pain. The history is provided by the patient. No language interpreter was used.  Back Pain  This is a chronic problem. The problem occurs constantly. The pain is present in the lumbar spine. The quality of the pain is described as aching. The pain does not radiate. The pain is severe. Pertinent negatives include no fever, no numbness and no weakness. Treatments tried: tramadol.    Past Medical History  Diagnosis Date  . Back pain   . Seizures     Past Surgical History  Procedure Date  . Back surgery   . Appendectomy   . Abdominal surgery     History reviewed. No pertinent family history.  History  Substance Use Topics  . Smoking status: Current Every Day Smoker -- 0.5 packs/day  . Smokeless tobacco: Not on file  . Alcohol Use: No      Review of Systems  Constitutional: Negative for fever and chills.  Musculoskeletal: Positive for back pain.  Neurological: Negative for weakness and numbness.  All other systems reviewed and are negative.    Allergies  Benadryl; Prednisone; and Nsaids  Home Medications   Current Outpatient Rx  Name Route Sig Dispense Refill  . METHOCARBAMOL 750 MG PO TABS Oral Take 750 mg by mouth 3 (three) times daily.    . ASENAPINE MALEATE 5 MG SL SUBL Sublingual Place 5 mg under the tongue 2 (two) times daily.    Marland Kitchen CITALOPRAM HYDROBROMIDE 20 MG PO TABS Oral Take 20 mg by mouth daily.    Marland Kitchen PHENYTOIN SODIUM EXTENDED 100 MG PO CAPS Oral Take 100 mg by mouth 3  (three) times daily.     . TRAMADOL HCL 50 MG PO TABS  1-2 tabs po q 4-6 hrs prn pain (max 400 mg/24 hrs) 20 tablet 0  . TRAZODONE HCL 100 MG PO TABS Oral Take 100 mg by mouth at bedtime.      BP 143/77  Pulse 80  Temp 98 F (36.7 C) (Oral)  Resp 20  Ht 6' (1.829 m)  Wt 256 lb (116.121 kg)  BMI 34.72 kg/m2  SpO2 96%  Physical Exam  Nursing note and vitals reviewed. Constitutional: He is oriented to person, place, and time. He appears well-developed and well-nourished.  HENT:  Head: Normocephalic and atraumatic.  Eyes: EOM are normal.  Neck: Normal range of motion.  Cardiovascular: Normal rate, regular rhythm, normal heart sounds and intact distal pulses.   Pulmonary/Chest: Effort normal and breath sounds normal. No respiratory distress.  Abdominal: Soft. He exhibits no distension. There is no tenderness.  Musculoskeletal: He exhibits tenderness.       Lumbar back: He exhibits decreased range of motion and tenderness.       Back:  Neurological: He is alert and oriented to person, place, and time.  Skin: Skin is warm and dry.  Psychiatric: He has a normal mood and affect. Judgment normal.    ED Course  Procedures (including critical care time)  Labs Reviewed - No data to  display No results found.   1. Acute exacerbation of chronic low back pain       MDM  rx-tramadol , 20 F/u with PCP        Evalina Field, PA 04/29/12 1612  Evalina Field, PA 04/29/12 [phone]

## 2012-04-29 NOTE — ED Notes (Signed)
Pt states he has been having problems with his BP for about 5 years. Does not take meds because they make him feel bad. Presents today from [facility] with concerns that his BP is up. C/O H/A and dizziness at times.

## 2012-04-30 NOTE — ED Provider Notes (Signed)
Medical screening examination/treatment/procedure(s) were performed by non-physician practitioner and as supervising physician I was immediately available for consultation/collaboration.   David H Yao, MD 04/30/12 2328 

## 2012-05-07 ENCOUNTER — Emergency Department (HOSPITAL_BASED_OUTPATIENT_CLINIC_OR_DEPARTMENT_OTHER)
Admission: EM | Admit: 2012-05-07 | Discharge: 2012-05-07 | Disposition: A | Discharge status: Home / Self Care | Payer: Medicare Other | Attending: Emergency Medicine | Admitting: Emergency Medicine

## 2012-05-07 ENCOUNTER — Encounter (HOSPITAL_BASED_OUTPATIENT_CLINIC_OR_DEPARTMENT_OTHER): Payer: Self-pay | Admitting: *Deleted

## 2012-05-07 DIAGNOSIS — Z87891 Personal history of nicotine dependence: Secondary | ICD-10-CM | POA: Insufficient documentation

## 2012-05-07 DIAGNOSIS — R569 Unspecified convulsions: Secondary | ICD-10-CM | POA: Insufficient documentation

## 2012-05-07 DIAGNOSIS — Z9089 Acquired absence of other organs: Secondary | ICD-10-CM | POA: Insufficient documentation

## 2012-05-07 DIAGNOSIS — M949 Disorder of cartilage, unspecified: Secondary | ICD-10-CM | POA: Insufficient documentation

## 2012-05-07 DIAGNOSIS — M549 Dorsalgia, unspecified: Secondary | ICD-10-CM

## 2012-05-07 DIAGNOSIS — M899 Disorder of bone, unspecified: Secondary | ICD-10-CM | POA: Insufficient documentation

## 2012-05-07 MED ORDER — HYDROMORPHONE HCL PF 2 MG/ML IJ SOLN
2.0000 mg | Freq: Once | INTRAMUSCULAR | Status: AC
  Administered 2012-05-07: 2 mg via INTRAMUSCULAR
  Filled 2012-05-07: qty 1

## 2012-05-07 NOTE — ED Notes (Signed)
Patient states that when he got up this morning he felt a shift in his lower back resulting in sharp pain shooting down R leg, hurt to stand afterwards, numbness&tingling down R leg

## 2012-05-07 NOTE — ED Notes (Signed)
Prior to giving pain medication, patient asked if it would cause any issues with his contract or treatment at [facility]. Patient stated that he could take the medication while here.

## 2012-05-07 NOTE — ED Provider Notes (Signed)
History     CSN: [id number]  Arrival date & time 05/07/12  1059   First MD Initiated Contact with Patient 05/07/12 1125      Chief Complaint  Patient presents with  . Back Pain    (Consider location/radiation/quality/duration/timing/severity/associated sxs/prior treatment) HPI Comments: 41 year old male with a history of lower back pain status post laminectomy twice in the past who presents with recurrent lower back pain and pain radiating down his right leg. He states that baseline for him is having some numbness and tingling and a burning sensation on the bottom of his right foot, this morning at breakfast he turned in an odd way and felt a pain in his lower back radiating down the right leg. This is similar in location but more intense than usual, not associated with difficulty urinating but has difficulty ambulating because of the pain. It is persistent, he is unable to take prednisone secondary to significant side effects. He does not have a followup with his spinal surgeon scheduled at this time  Patient is a 41 y.o. male presenting with back pain. The history is provided by the patient and medical records.  Back Pain  Associated symptoms include numbness and weakness.    Past Medical History  Diagnosis Date  . Back pain   . Seizures     Past Surgical History  Procedure Date  . Back surgery   . Appendectomy   . Abdominal surgery     No family history on file.  History  Substance Use Topics  . Smoking status: Former Smoker -- 0.5 packs/day  . Smokeless tobacco: Not on file  . Alcohol Use: No      Review of Systems  Musculoskeletal: Positive for back pain.  Skin: Negative for rash.  Neurological: Positive for weakness and numbness.       Paresthesias    Allergies  Benadryl; Prednisone; and Nsaids  Home Medications   Current Outpatient Rx  Name Route Sig Dispense Refill  . ACETAMINOPHEN 325 MG PO TABS Oral Take 650 mg by mouth every 4 (four) hours as  needed.    . ASENAPINE MALEATE 5 MG SL SUBL Sublingual Place 5 mg under the tongue 2 (two) times daily.    Marland Kitchen CITALOPRAM HYDROBROMIDE 20 MG PO TABS Oral Take 20 mg by mouth daily.    Marland Kitchen METHOCARBAMOL 750 MG PO TABS Oral Take 750 mg by mouth 3 (three) times daily.    Marland Kitchen PHENYTOIN SODIUM EXTENDED 100 MG PO CAPS Oral Take 100 mg by mouth 3 (three) times daily.     . TRAMADOL HCL 50 MG PO TABS  1-2 tabs po q 4-6 hrs prn pain (max 400 mg/24 hrs) 20 tablet 0  . TRAZODONE HCL 100 MG PO TABS Oral Take 100 mg by mouth at bedtime.      BP 133/74  Pulse 66  Temp 98.6 F (37 C)  Resp 18  SpO2 98%  Physical Exam  Nursing note and vitals reviewed. Constitutional: He appears well-developed and well-nourished. No distress.  HENT:  Head: Normocephalic and atraumatic.  Eyes: Conjunctivae normal are normal. No scleral icterus.  Cardiovascular: Normal rate, regular rhythm and intact distal pulses.   Pulmonary/Chest: Effort normal and breath sounds normal.  Abdominal: Soft.       No pulsating masses, no guarding, no tenderness  Musculoskeletal: He exhibits tenderness ( Local tenderness to the lumbar spine and right paraspinal muscles, no pain in the right buttock).       No spinal  tenderness of the cervical, thoracic or lumbar spines  Neurological: He is alert.       Pain limits range of motion however strength is normal 5 out of 5 in both bilateral extremities, sensation decreased to the right lower extremity the patient states that numbness is in similar location to prior numbness.  Skin: Skin is warm and dry. No erythema.    ED Course  Procedures (including critical care time)  Labs Reviewed - No data to display No results found.   1. 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 He is unable to take  anti-inflammatories or prednisone, we'll treat with intramuscular hydromorphone, reevaluate. Patient states he will call his spinal surgeon in the morning   Significant improvement after medications, patient is currently undergoing some kind of drug treatment program, he was not forthcoming with this  information prior to treatment, no prescription is given, patient aware of indications for return.     Vida Roller, MD 05/07/12 1247

## 2012-05-14 ENCOUNTER — Emergency Department (HOSPITAL_BASED_OUTPATIENT_CLINIC_OR_DEPARTMENT_OTHER)
Admission: EM | Admit: 2012-05-14 | Discharge: 2012-05-14 | Disposition: A | Discharge status: Home / Self Care | Payer: Medicare Other | Attending: Emergency Medicine | Admitting: Emergency Medicine

## 2012-05-14 ENCOUNTER — Encounter (HOSPITAL_BASED_OUTPATIENT_CLINIC_OR_DEPARTMENT_OTHER): Payer: Self-pay | Admitting: *Deleted

## 2012-05-14 DIAGNOSIS — M549 Dorsalgia, unspecified: Secondary | ICD-10-CM | POA: Insufficient documentation

## 2012-05-14 DIAGNOSIS — Z87891 Personal history of nicotine dependence: Secondary | ICD-10-CM | POA: Insufficient documentation

## 2012-05-14 MED ORDER — TRAMADOL HCL 50 MG PO TABS: 50.0000 mg | ORAL_TABLET | Freq: Four times a day (QID) | ORAL | Status: DC | PRN

## 2012-05-14 MED ORDER — HYDROMORPHONE HCL PF 2 MG/ML IJ SOLN
2.0000 mg | Freq: Once | INTRAMUSCULAR | Status: AC
  Administered 2012-05-14: 2 mg via INTRAMUSCULAR
  Filled 2012-05-14: qty 1

## 2012-05-14 MED ORDER — HYDROCODONE-ACETAMINOPHEN 5-325 MG PO TABS: 2.0000 | ORAL_TABLET | ORAL | Status: AC | PRN

## 2012-05-14 MED ORDER — ONDANSETRON HCL 4 MG/2ML IJ SOLN
4.0000 mg | Freq: Once | INTRAMUSCULAR | Status: AC
  Administered 2012-05-14: 4 mg via INTRAMUSCULAR
  Filled 2012-05-14: qty 2

## 2012-05-14 NOTE — ED Provider Notes (Deleted)
History     CSN: [id number]  Arrival date & time 05/14/12  1455   First MD Initiated Contact with Patient 05/14/12 1729      Chief Complaint  Patient presents with  . Back Pain    (Consider location/radiation/quality/duration/timing/severity/associated sxs/prior treatment) Patient is a 41 y.o. male presenting with back pain. The history is provided by the patient. No language interpreter was used.  Back Pain  This is a recurrent problem. The current episode started more than 1 week ago. The problem has not changed since onset.The pain is present in the lumbar spine. The pain is moderate. The symptoms are aggravated by twisting. The pain is the same all the time. Stiffness is present all day. He has tried nothing for the symptoms. The treatment provided mild relief.    Past Medical History  Diagnosis Date  . Back pain   . Seizures     Past Surgical History  Procedure Date  . Back surgery   . Appendectomy   . Abdominal surgery     History reviewed. No pertinent family history.  History  Substance Use Topics  . Smoking status: Former Smoker -- 0.5 packs/day  . Smokeless tobacco: Not on file  . Alcohol Use: No      Review of Systems  Musculoskeletal: Positive for back pain.  All other systems reviewed and are negative.    Allergies  Benadryl; Prednisone; and Nsaids  Home Medications   Current Outpatient Rx  Name Route Sig Dispense Refill  . ACETAMINOPHEN 325 MG PO TABS Oral Take 650 mg by mouth every 4 (four) hours as needed.    . ASENAPINE MALEATE 5 MG SL SUBL Sublingual Place 5 mg under the tongue 2 (two) times daily.    Marland Kitchen CITALOPRAM HYDROBROMIDE 20 MG PO TABS Oral Take 20 mg by mouth daily.    Marland Kitchen METHOCARBAMOL 750 MG PO TABS Oral Take 750 mg by mouth 3 (three) times daily.    Marland Kitchen PHENYTOIN SODIUM EXTENDED 100 MG PO CAPS Oral Take 100 mg by mouth 3 (three) times daily.     . TRAMADOL HCL 50 MG PO TABS  1-2 tabs po q 4-6 hrs prn pain (max 400 mg/24 hrs) 20  tablet 0  . TRAMADOL HCL 50 MG PO TABS Oral Take 1 tablet (50 mg total) by mouth every 6 (six) hours as needed for pain. 20 tablet 0  . TRAZODONE HCL 100 MG PO TABS Oral Take 100 mg by mouth at bedtime.      BP 159/96  Pulse 80  Temp 98.3 F (36.8 C) (Oral)  Resp 20  Ht 6' (1.829 m)  Wt 265 lb (120.203 kg)  BMI 35.94 kg/m2  SpO2 96%  Physical Exam  Constitutional: He is oriented to person, place, and time. He appears well-developed and well-nourished.  HENT:  Head: Normocephalic.  Eyes: EOM are normal.  Neck: Normal range of motion.  Pulmonary/Chest: Effort normal.  Abdominal: He exhibits no distension.  Musculoskeletal:       Decreased range of motion back  Neurological: He is alert and oriented to person, place, and time.  Psychiatric: He has a normal mood and affect.    ED Course  Procedures (including critical care time)  Labs Reviewed - No data to display No results found.   1. Back pain       MDM  Pt given pain shot here,   Rx for tramadol        Lonia Skinner [place], [place] 05/14/12  [address]  Lonia Skinner [place], [place] 06/15/12 1236  Lonia Skinner [place], [place] 06/15/12 [phone]

## 2012-05-14 NOTE — ED Notes (Signed)
Pt states he has a hx of back pain and was seen here for same. Referred to his surgeon who stated that he could not see him unless he had a referral. No relief with OTC meds.

## 2012-05-14 NOTE — ED Provider Notes (Signed)
Medical screening examination/treatment/procedure(s) were performed by non-physician practitioner and as supervising physician I was immediately available for consultation/collaboration.   Jericha Bryden, MD 05/14/12 2329 

## 2012-06-15 NOTE — ED Provider Notes (Signed)
History     CSN: [id number]  Arrival date & time 05/14/12  1455   First MD Initiated Contact with Patient 05/14/12 1729      Chief Complaint  Patient presents with  . Back Pain    (Consider location/radiation/quality/duration/timing/severity/associated sxs/prior treatment) Patient is a 41 y.o. male presenting with back pain. The history is provided by the patient. No language interpreter was used.  Back Pain  The current episode started more than 1 week ago. The problem occurs constantly. The problem has been gradually worsening. The pain is present in the lumbar spine. The pain is severe. He has tried analgesics for the symptoms. The treatment provided mild relief.    Past Medical History  Diagnosis Date  . Back pain   . Seizures     Past Surgical History  Procedure Date  . Back surgery   . Appendectomy   . Abdominal surgery     History reviewed. No pertinent family history.  History  Substance Use Topics  . Smoking status: Former Smoker -- 0.5 packs/day  . Smokeless tobacco: Not on file  . Alcohol Use: No      Review of Systems  Musculoskeletal: Positive for back pain.  All other systems reviewed and are negative.    Allergies  Benadryl; Prednisone; and Nsaids  Home Medications   Current Outpatient Rx  Name  Route  Sig  Dispense  Refill  . ACETAMINOPHEN 325 MG PO TABS   Oral   Take 650 mg by mouth every 4 (four) hours as needed.         . ASENAPINE MALEATE 5 MG SL SUBL   Sublingual   Place 5 mg under the tongue 2 (two) times daily.         Marland Kitchen CITALOPRAM HYDROBROMIDE 20 MG PO TABS   Oral   Take 20 mg by mouth daily.         Marland Kitchen METHOCARBAMOL 750 MG PO TABS   Oral   Take 750 mg by mouth 3 (three) times daily.         Marland Kitchen PHENYTOIN SODIUM EXTENDED 100 MG PO CAPS   Oral   Take 100 mg by mouth 3 (three) times daily.          . TRAMADOL HCL 50 MG PO TABS      1-2 tabs po q 4-6 hrs prn pain (max 400 mg/24 hrs)   20 tablet   0   .  TRAZODONE HCL 100 MG PO TABS   Oral   Take 100 mg by mouth at bedtime.           BP 159/96  Pulse 80  Temp 98.3 F (36.8 C) (Oral)  Resp 20  Ht 6' (1.829 m)  Wt 265 lb (120.203 kg)  BMI 35.94 kg/m2  SpO2 96%  Physical Exam  Nursing note and vitals reviewed. Constitutional: He is oriented to person, place, and time. He appears well-developed and well-nourished.  HENT:  Head: Normocephalic.  Eyes: EOM are normal. Pupils are equal, round, and reactive to light.  Neck: Normal range of motion.  Cardiovascular: Normal rate and regular rhythm.   Pulmonary/Chest: Effort normal.  Abdominal: Soft. He exhibits no distension.  Musculoskeletal: Normal range of motion. He exhibits tenderness.       Decreased range of motion ls spine  Neurological: He is alert and oriented to person, place, and time.  Skin: Skin is warm.  Psychiatric: He has a normal mood and affect.  ED Course  Procedures (including critical care time)  Labs Reviewed - No data to display No results found.   1. Back pain       MDM  Pt given injection for pain,  Rx for tramadol        Lonia Skinner [place], [place] 06/15/12 1254

## 2012-10-17 ENCOUNTER — Emergency Department (HOSPITAL_BASED_OUTPATIENT_CLINIC_OR_DEPARTMENT_OTHER)
Admission: EM | Admit: 2012-10-17 | Discharge: 2012-10-17 | Disposition: A | Discharge status: Home / Self Care | Payer: Medicare Other | Attending: Emergency Medicine | Admitting: Emergency Medicine

## 2012-10-17 ENCOUNTER — Encounter (HOSPITAL_BASED_OUTPATIENT_CLINIC_OR_DEPARTMENT_OTHER): Payer: Self-pay

## 2012-10-17 DIAGNOSIS — F17200 Nicotine dependence, unspecified, uncomplicated: Secondary | ICD-10-CM | POA: Insufficient documentation

## 2012-10-17 DIAGNOSIS — R109 Unspecified abdominal pain: Secondary | ICD-10-CM | POA: Insufficient documentation

## 2012-10-17 DIAGNOSIS — G40909 Epilepsy, unspecified, not intractable, without status epilepticus: Secondary | ICD-10-CM | POA: Insufficient documentation

## 2012-10-17 DIAGNOSIS — R300 Dysuria: Secondary | ICD-10-CM | POA: Insufficient documentation

## 2012-10-17 DIAGNOSIS — M545 Low back pain, unspecified: Secondary | ICD-10-CM | POA: Insufficient documentation

## 2012-10-17 DIAGNOSIS — Z8739 Personal history of other diseases of the musculoskeletal system and connective tissue: Secondary | ICD-10-CM | POA: Insufficient documentation

## 2012-10-17 LAB — URINE MICROSCOPIC-ADD ON

## 2012-10-17 LAB — URINALYSIS, ROUTINE W REFLEX MICROSCOPIC
Bilirubin Urine: NEGATIVE
Glucose, UA: NEGATIVE mg/dL
Ketones, ur: NEGATIVE mg/dL
Leukocytes, UA: NEGATIVE
Nitrite: NEGATIVE
Protein, ur: NEGATIVE mg/dL
Specific Gravity, Urine: 1.016 (ref 1.005–1.030)
Urobilinogen, UA: 0.2 mg/dL (ref 0.0–1.0)
pH: 7 (ref 5.0–8.0)

## 2012-10-17 MED ORDER — HYDROMORPHONE HCL PF 1 MG/ML IJ SOLN
1.0000 mg | Freq: Once | INTRAMUSCULAR | Status: AC
  Administered 2012-10-17: 1 mg via INTRAMUSCULAR
  Filled 2012-10-17: qty 1

## 2012-10-17 NOTE — ED Provider Notes (Signed)
History     CSN: [id number]  Arrival date & time 10/17/12  1726   First MD Initiated Contact with Patient 10/17/12 1741      Chief Complaint  Patient presents with  . Back Pain    (Consider location/radiation/quality/duration/timing/severity/associated sxs/prior treatment) Patient is a 42 y.o. male presenting with back pain. The history is provided by the patient. No language interpreter was used.  Back Pain Location:  Sacro-iliac joint (wraps around to left groin and abdomen.) Quality:  Aching, shooting and cramping Radiates to:  L thigh Pain severity:  Severe Pain is:  Same all the time Onset quality:  Gradual Timing:  Constant Progression:  Unchanged Chronicity:  Chronic (current episode is exacerbation of chronic LBP) Context: physical stress   Context comment:  Irritated back from helping move furniture for a friend. Relieved by:  Being still Worsened by:  Movement Ineffective treatments:  Ibuprofen and narcotics Associated symptoms: abdominal pain and dysuria   Associated symptoms: no abdominal swelling, no bladder incontinence, no bowel incontinence, no fever, no leg pain, no numbness, no paresthesias, no perianal numbness and no weakness     Past Medical History  Diagnosis Date  . Back pain   . Seizures     Past Surgical History  Procedure Laterality Date  . Back surgery    . Appendectomy    . Abdominal surgery      No family history on file.  History  Substance Use Topics  . Smoking status: Current Some Day Smoker -- 0.50 packs/day  . Smokeless tobacco: Not on file  . Alcohol Use: No      Review of Systems  Constitutional: Negative for fever.  Gastrointestinal: Positive for abdominal pain. Negative for bowel incontinence.  Genitourinary: Positive for dysuria. Negative for bladder incontinence.  Musculoskeletal: Positive for back pain.  Neurological: Negative for weakness, numbness and paresthesias.  All other systems reviewed and are  negative.    Allergies  Benadryl; Prednisone; and Nsaids  Home Medications   Current Outpatient Rx  Name  Route  Sig  Dispense  Refill  . acetaminophen (TYLENOL) 325 MG tablet   Oral   Take 650 mg by mouth every 4 (four) hours as needed.         Marland Kitchen asenapine (SAPHRIS) 5 MG SUBL   Sublingual   Place 5 mg under the tongue 2 (two) times daily.         . citalopram (CELEXA) 20 MG tablet   Oral   Take 20 mg by mouth daily.         . methocarbamol (ROBAXIN) 750 MG tablet   Oral   Take 750 mg by mouth 3 (three) times daily.         . phenytoin (DILANTIN) 100 MG ER capsule   Oral   Take 100 mg by mouth 3 (three) times daily.          . traMADol (ULTRAM) 50 MG tablet      1-2 tabs po q 4-6 hrs prn pain (max 400 mg/24 hrs)   20 tablet   0   . traZODone (DESYREL) 100 MG tablet   Oral   Take 100 mg by mouth at bedtime.           BP 148/76  Pulse 71  Temp(Src) 98.2 F (36.8 C) (Oral)  Resp 16  Ht 5\' 11"  (1.803 m)  Wt 284 lb (128.822 kg)  BMI 39.63 kg/m2  SpO2 96%  Physical Exam  Nursing note and  vitals reviewed. Constitutional: He is oriented to person, place, and time. He appears well-developed and well-nourished.  Eyes: EOM are normal. Pupils are equal, round, and reactive to light.  Neck: Normal range of motion. Neck supple.  Cardiovascular: Normal rate, regular rhythm and normal heart sounds.   Pulmonary/Chest: Effort normal and breath sounds normal.  Abdominal: Soft. Bowel sounds are normal. He exhibits no distension and no mass. There is tenderness. There is no rebound and no guarding.  Mild TTP in LLQ and testicular pain  Genitourinary: Penis normal.  Left testicular pain. No herniation appreciated.  Musculoskeletal: He exhibits tenderness.  Gait slow due to pain but able to stand erect.  Neg: straight leg raise    Neurological: He is alert and oriented to person, place, and time.  Sensation intact in left leg and foot.  Limited in right foot  (baseline for pt).  Skin: Skin is warm and dry. No rash noted. No erythema.  Psychiatric: He has a normal mood and affect. His behavior is normal. Judgment and thought content normal.    ED Course  Procedures (including critical care time)  Labs Reviewed  URINALYSIS, ROUTINE W REFLEX MICROSCOPIC - Abnormal; Notable for the following:    Hgb urine dipstick LARGE (*)    All other components within normal limits  URINE MICROSCOPIC-ADD ON   No results found.   1. LBP (low back pain)       MDM  Pleasant male presented to ED for left LBP and left groin pain.  States it started after helping a friend move furniture even though he knows he should not due to various back surgeries and herniated discs.  Do not believe imaging will be beneficial at this time.  No loss of bowel or bladder, no saddle paresthesia, able to ambulate without assistance.  UA: pos for RBC, compared to previous labs, this is normal for pt.  Consulted with Dr. Angela Burke.  Believes no imaging is necessary at this time.  F/u with PCP next week as scheduled.    Gave patient 1 dose of Dilaudid in ED.  Minimal relief.  Due to pt pain management hx, did not prescribe pain medication.  Advised to f/u with PC or return to ED if develop loss of bowel or bladder, unable to void, or develops a fever.  Pt understood and agreed with tx plan.  Discharged in stable condition.  Vitals: unremarkable.        Junius Finner, PA-C 10/17/12 [phone]

## 2012-10-17 NOTE — ED Provider Notes (Signed)
Medical screening examination/treatment/procedure(s) were performed by non-physician practitioner and as supervising physician I was immediately available for consultation/collaboration.   Charles B. Sheldon, MD 10/17/12 2026 

## 2012-10-17 NOTE — ED Notes (Signed)
C/o pain to left side of back, left leg, left abd, groin-pain started approx 1 hour after lifting furniture

## 2013-01-04 ENCOUNTER — Encounter (HOSPITAL_BASED_OUTPATIENT_CLINIC_OR_DEPARTMENT_OTHER): Payer: Self-pay | Admitting: Family Medicine

## 2013-01-04 ENCOUNTER — Emergency Department (HOSPITAL_BASED_OUTPATIENT_CLINIC_OR_DEPARTMENT_OTHER)
Admission: EM | Admit: 2013-01-04 | Discharge: 2013-01-04 | Disposition: A | Discharge status: Home / Self Care | Payer: Medicare Other | Attending: Emergency Medicine | Admitting: Emergency Medicine

## 2013-01-04 DIAGNOSIS — Z9889 Other specified postprocedural states: Secondary | ICD-10-CM | POA: Insufficient documentation

## 2013-01-04 DIAGNOSIS — F319 Bipolar disorder, unspecified: Secondary | ICD-10-CM | POA: Insufficient documentation

## 2013-01-04 DIAGNOSIS — G40909 Epilepsy, unspecified, not intractable, without status epilepticus: Secondary | ICD-10-CM | POA: Insufficient documentation

## 2013-01-04 DIAGNOSIS — Z79899 Other long term (current) drug therapy: Secondary | ICD-10-CM | POA: Insufficient documentation

## 2013-01-04 DIAGNOSIS — F17200 Nicotine dependence, unspecified, uncomplicated: Secondary | ICD-10-CM | POA: Insufficient documentation

## 2013-01-04 DIAGNOSIS — M545 Low back pain, unspecified: Secondary | ICD-10-CM | POA: Insufficient documentation

## 2013-01-04 DIAGNOSIS — G8929 Other chronic pain: Secondary | ICD-10-CM | POA: Insufficient documentation

## 2013-01-04 DIAGNOSIS — M549 Dorsalgia, unspecified: Secondary | ICD-10-CM

## 2013-01-04 HISTORY — DX: Bipolar disorder, unspecified: F31.9

## 2013-01-04 HISTORY — DX: Alcohol abuse, in remission: F10.11

## 2013-01-04 MED ORDER — HYDROMORPHONE HCL PF 1 MG/ML IJ SOLN
1.0000 mg | Freq: Once | INTRAMUSCULAR | Status: AC
  Administered 2013-01-04: 1 mg via INTRAMUSCULAR
  Filled 2013-01-04: qty 1

## 2013-01-04 MED ORDER — DIAZEPAM 5 MG PO TABS
5.0000 mg | ORAL_TABLET | Freq: Once | ORAL | Status: AC
  Administered 2013-01-04: 5 mg via ORAL
  Filled 2013-01-04: qty 1

## 2013-01-04 NOTE — ED Notes (Signed)
Pt c/o low back pain, chronic x 10 years but worse past 3 days. Pt takes tramadol and hydrocodone for same but sts no relief. Pt denies bowel or bladder incontinence. Pt ambulatory to room.

## 2013-01-10 NOTE — ED Provider Notes (Signed)
History    42 year old male with lower back pain. Chronic in nature. Patient having back pain for the past 10 years. He states that is acutely worse the past 2 days. Denies any acute injury. No bladder or bowel incontinence or retention. No acute numbness, tingling or loss of strength. Denies use of blood thinning medication. Denies history of IV drug use. Has been previously evaluated by neurosurgery for the same. CSN: [id number]  Arrival date & time 01/04/13  1022   First MD Initiated Contact with Patient 01/04/13 1108      Chief Complaint  Patient presents with  . Back Pain    (Consider location/radiation/quality/duration/timing/severity/associated sxs/prior treatment) HPI  Past Medical History  Diagnosis Date  . Back pain   . Seizures   . History of ETOH abuse   . Bipolar 1 disorder     Past Surgical History  Procedure Laterality Date  . Back surgery    . Appendectomy    . Abdominal surgery      No family history on file.  History  Substance Use Topics  . Smoking status: Current Some Day Smoker -- 0.50 packs/day  . Smokeless tobacco: Not on file  . Alcohol Use: No     Comment: completed [employer] program      Review of Systems  All systems reviewed and negative, other than as noted in HPI.   Allergies  Benadryl; Prednisone; and Nsaids  Home Medications   Current Outpatient Rx  Name  Route  Sig  Dispense  Refill  . HYDROcodone-acetaminophen (NORCO/VICODIN) 5-325 MG per tablet   Oral   Take 1 tablet by mouth every 6 (six) hours as needed for pain.         Marland Kitchen acetaminophen (TYLENOL) 325 MG tablet   Oral   Take 650 mg by mouth every 4 (four) hours as needed.         Marland Kitchen asenapine (SAPHRIS) 5 MG SUBL   Sublingual   Place 5 mg under the tongue 2 (two) times daily.         . citalopram (CELEXA) 20 MG tablet   Oral   Take 20 mg by mouth daily.         . methocarbamol (ROBAXIN) 750 MG tablet   Oral   Take 750 mg by mouth 3 (three) times daily.         . phenytoin (DILANTIN) 100 MG ER capsule   Oral   Take 100 mg by mouth 3 (three) times daily.          . traMADol (ULTRAM) 50 MG tablet      1-2 tabs po q 4-6 hrs prn pain (max 400 mg/24 hrs)   20 tablet   0   . traZODone (DESYREL) 100 MG tablet   Oral   Take 100 mg by mouth at bedtime.           BP 149/86  Pulse 73  Temp(Src) 98.4 F (36.9 C) (Oral)  Resp 16  Ht 5\' 11"  (1.803 m)  Wt 290 lb (131.543 kg)  BMI 40.46 kg/m2  SpO2 99%  Physical Exam  Nursing note and vitals reviewed. Constitutional: He appears well-developed and well-nourished. No distress.  HENT:  Head: Normocephalic and atraumatic.  Eyes: Conjunctivae are normal. Right eye exhibits no discharge. Left eye exhibits no discharge.  Neck: Neck supple.  Cardiovascular: Normal rate, regular rhythm and normal heart sounds.  Exam reveals no gallop and no friction rub.   No murmur heard. Pulmonary/Chest:  Effort normal and breath sounds normal. No respiratory distress.  Abdominal: Soft. He exhibits no distension. There is no tenderness.  Musculoskeletal: He exhibits no edema and no tenderness.       Arms: Neurological: He is alert.  Skin: Skin is warm and dry.  Psychiatric: He has a normal mood and affect. His behavior is normal. Thought content normal.    ED Course  Procedures (including critical care time)  Labs Reviewed - No data to display No results found.   1. Back pain       MDM  41yM with back pain.  Atraumatic. Non focal neuro exam. No blood thinning medications. No bladder/bowel incontinence or retention. Denies hx of IV drug use. Doubt cauda equina, spinal epidural abscess, spinal epidural hematoma, fracture, vertebral osteomyelitis/discitis or other potential emergent etiology. No indication for emergent imaging.  Plan symptomatic tx, but discussed with pt that would not be prescribing narcotics for chronic pain. Return precautions discussed. Outpt FU  otherwise.         Raeford Razor, MD 01/10/13 2231

## 2013-09-04 ENCOUNTER — Emergency Department (HOSPITAL_BASED_OUTPATIENT_CLINIC_OR_DEPARTMENT_OTHER)
Admission: EM | Admit: 2013-09-04 | Discharge: 2013-09-04 | Disposition: A | Discharge status: Home / Self Care | Payer: Medicare Other | Attending: Emergency Medicine | Admitting: Emergency Medicine

## 2013-09-04 ENCOUNTER — Encounter (HOSPITAL_BASED_OUTPATIENT_CLINIC_OR_DEPARTMENT_OTHER): Payer: Self-pay | Admitting: Emergency Medicine

## 2013-09-04 DIAGNOSIS — Z87891 Personal history of nicotine dependence: Secondary | ICD-10-CM | POA: Insufficient documentation

## 2013-09-04 DIAGNOSIS — M545 Low back pain, unspecified: Secondary | ICD-10-CM | POA: Insufficient documentation

## 2013-09-04 DIAGNOSIS — F319 Bipolar disorder, unspecified: Secondary | ICD-10-CM | POA: Insufficient documentation

## 2013-09-04 DIAGNOSIS — F1021 Alcohol dependence, in remission: Secondary | ICD-10-CM | POA: Insufficient documentation

## 2013-09-04 DIAGNOSIS — G40909 Epilepsy, unspecified, not intractable, without status epilepticus: Secondary | ICD-10-CM | POA: Insufficient documentation

## 2013-09-04 DIAGNOSIS — Z79899 Other long term (current) drug therapy: Secondary | ICD-10-CM | POA: Insufficient documentation

## 2013-09-04 DIAGNOSIS — M79609 Pain in unspecified limb: Secondary | ICD-10-CM | POA: Insufficient documentation

## 2013-09-04 DIAGNOSIS — M549 Dorsalgia, unspecified: Secondary | ICD-10-CM

## 2013-09-04 MED ORDER — HYDROCODONE-ACETAMINOPHEN 5-325 MG PO TABS: 1.0000 | ORAL_TABLET | Freq: Four times a day (QID) | ORAL | Status: DC | PRN

## 2013-09-04 MED ORDER — METHOCARBAMOL 750 MG PO TABS: 750.0000 mg | ORAL_TABLET | Freq: Three times a day (TID) | ORAL | Status: DC
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
## 2013-09-04 NOTE — ED Provider Notes (Signed)
History/physical exam/procedure(s) were performed by non-physician practitioner and as supervising physician I was immediately available for consultation/collaboration. I have reviewed all notes and am in agreement with care and plan.   Harden Bramer S Taisley Mordan, MD 09/04/13 1547 

## 2013-09-04 NOTE — Discharge Instructions (Signed)
Back Pain, Adult Low back pain is very common. About 1 in 5 people have back pain.The cause of low back pain is rarely dangerous. The pain often gets better over time.About half of people with a sudden onset of back pain feel better in just 2 weeks. About 8 in 10 people feel better by 6 weeks.  CAUSES Some common causes of back pain include:  Strain of the muscles or ligaments supporting the spine.  Wear and tear (degeneration) of the spinal discs.  Arthritis.  Direct injury to the back. DIAGNOSIS Most of the time, the direct cause of low back pain is not known.However, back pain can be treated effectively even when the exact cause of the pain is unknown.Answering your caregiver's questions about your overall health and symptoms is one of the most accurate ways to make sure the cause of your pain is not dangerous. If your caregiver needs more information, he or she may order lab work or imaging tests (X-rays or MRIs).However, even if imaging tests show changes in your back, this usually does not require surgery. HOME CARE INSTRUCTIONS For many people, back pain returns.Since low back pain is rarely dangerous, it is often a condition that people can learn to manageon their own.   Remain active. It is stressful on the back to sit or stand in one place. Do not sit, drive, or stand in one place for more than 30 minutes at a time. Take short walks on level surfaces as soon as pain allows.Try to increase the length of time you walk each day.  Do not stay in bed.Resting more than 1 or 2 days can delay your recovery.  Do not avoid exercise or work.Your body is made to move.It is not dangerous to be active, even though your back may hurt.Your back will likely heal faster if you return to being active before your pain is gone.  Pay attention to your body when you bend and lift. Many people have less discomfortwhen lifting if they bend their knees, keep the load close to their bodies,and  avoid twisting. Often, the most comfortable positions are those that put less stress on your recovering back.  Find a comfortable position to sleep. Use a firm mattress and lie on your side with your knees slightly bent. If you lie on your back, put a pillow under your knees.  Only take over-the-counter or prescription medicines as directed by your caregiver. Over-the-counter medicines to reduce pain and inflammation are often the most helpful.Your caregiver may prescribe muscle relaxant drugs.These medicines help dull your pain so you can more quickly return to your normal activities and healthy exercise.  Put ice on the injured area.  Put ice in a plastic bag.  Place a towel between your skin and the bag.  Leave the ice on for 15-20 minutes, 03-04 times a day for the first 2 to 3 days. After that, ice and heat may be alternated to reduce pain and spasms.  Ask your caregiver about trying back exercises and gentle massage. This may be of some benefit.  Avoid feeling anxious or stressed.Stress increases muscle tension and can worsen back pain.It is important to recognize when you are anxious or stressed and learn ways to manage it.Exercise is a great option. SEEK MEDICAL CARE IF:  You have pain that is not relieved with rest or medicine.  You have pain that does not improve in 1 week.  You have new symptoms.  You are generally not feeling well. SEEK   IMMEDIATE MEDICAL CARE IF:   You have pain that radiates from your back into your legs.  You develop new bowel or bladder control problems.  You have unusual weakness or numbness in your arms or legs.  You develop nausea or vomiting.  You develop abdominal pain.  You feel faint. Document Released: 07/26/2005 Document Revised: 01/25/2012 Document Reviewed: 12/14/2010 [employer] Patient Information 2014 [employer].  

## 2013-09-04 NOTE — ED Provider Notes (Signed)
CSN: [id number]     Arrival date & time 09/04/13  1248 History   First MD Initiated Contact with Patient 09/04/13 1443     Chief Complaint  Patient presents with  . Back Pain  . Leg Pain   (Consider location/radiation/quality/duration/timing/severity/associated sxs/prior Treatment) Patient is a 43 y.o. male presenting with back pain and leg pain. The history is provided by the patient. No language interpreter was used.  Back Pain Location:  Lumbar spine Quality:  Aching Radiates to:  R posterior upper leg Pain severity:  Severe Pain is:  Same all the time Onset quality:  Gradual Timing:  Constant Progression:  Worsening Chronicity:  Chronic Relieved by:  Nothing Worsened by:  Nothing tried Ineffective treatments:  None tried Associated symptoms: leg pain   Leg Pain Associated symptoms: back pain     Past Medical History  Diagnosis Date  . Back pain   . Seizures   . History of ETOH abuse   . Bipolar 1 disorder    Past Surgical History  Procedure Laterality Date  . Back surgery    . Appendectomy    . Abdominal surgery     No family history on file. History  Substance Use Topics  . Smoking status: Former Smoker -- 0.50 packs/day    Types: Cigarettes  . Smokeless tobacco: Not on file  . Alcohol Use: No     Comment: completed [employer] program    Review of Systems  Musculoskeletal: Positive for back pain and myalgias.  All other systems reviewed and are negative.    Allergies  Benadryl; Prednisone; and Nsaids  Home Medications   Current Outpatient Rx  Name  Route  Sig  Dispense  Refill  . gabapentin (NEURONTIN) 300 MG capsule   Oral   Take 300 mg by mouth 3 (three) times daily.         . OXcarbazepine (TRILEPTAL PO)   Oral   Take by mouth.         Marland Kitchen. acetaminophen (TYLENOL) 325 MG tablet   Oral   Take 650 mg by mouth every 4 (four) hours as needed.         Marland Kitchen. asenapine (SAPHRIS) 5 MG SUBL   Sublingual   Place 5 mg under the tongue 2 (two)  times daily.         . citalopram (CELEXA) 20 MG tablet   Oral   Take 20 mg by mouth daily.         Marland Kitchen. HYDROcodone-acetaminophen (NORCO/VICODIN) 5-325 MG per tablet   Oral   Take 1 tablet by mouth every 6 (six) hours as needed for pain.         . methocarbamol (ROBAXIN) 750 MG tablet   Oral   Take 750 mg by mouth 3 (three) times daily.         . phenytoin (DILANTIN) 100 MG ER capsule   Oral   Take 100 mg by mouth 3 (three) times daily.          . traMADol (ULTRAM) 50 MG tablet      1-2 tabs po q 4-6 hrs prn pain (max 400 mg/24 hrs)   20 tablet   0   . traZODone (DESYREL) 100 MG tablet   Oral   Take 100 mg by mouth at bedtime.          BP 126/73  Pulse 60  Temp(Src) 98.4 F (36.9 C) (Oral)  Resp 18  Ht 6' (1.829 m)  Wt 265  lb (120.203 kg)  BMI 35.93 kg/m2  SpO2 97% Physical Exam  Nursing note and vitals reviewed. Constitutional: He appears well-developed and well-nourished.  HENT:  Head: Normocephalic.  Right Ear: External ear normal.  Left Ear: External ear normal.  Nose: Nose normal.  Mouth/Throat: Oropharynx is clear and moist.  Eyes: Conjunctivae are normal. Pupils are equal, round, and reactive to light.  Neck: Normal range of motion. Neck supple.  Cardiovascular: Normal rate and normal heart sounds.   Pulmonary/Chest: Effort normal and breath sounds normal.  Abdominal: Soft. Bowel sounds are normal.  Musculoskeletal: Normal range of motion.  Neurological: He is alert.  Skin: Skin is warm.  Psychiatric: He has a normal mood and affect.    ED Course  Procedures (including critical care time) Labs Review Labs Reviewed - No data to display Imaging Review No results found.  EKG Interpretation   None       MDM   1. Back pain    Rx for robaxin and Hydrocodone.   Pt advised to get his MRI from [facility].   Pt wants neurosurgeon in [place].   Pt given number for [facility] who is on call today.  Pt also given primary care referral   Community wellness   Lonia Skinner [place], [place] 09/04/13 1514

## 2013-10-21 ENCOUNTER — Inpatient Hospital Stay (HOSPITAL_BASED_OUTPATIENT_CLINIC_OR_DEPARTMENT_OTHER)
Admission: EM | Admit: 2013-10-21 | Discharge: 2013-10-23 | DRG: 313 | Disposition: A | Discharge status: Home / Self Care | Payer: Medicare Other | Attending: Internal Medicine | Admitting: Internal Medicine

## 2013-10-21 ENCOUNTER — Emergency Department (HOSPITAL_BASED_OUTPATIENT_CLINIC_OR_DEPARTMENT_OTHER): Payer: Medicare Other

## 2013-10-21 ENCOUNTER — Encounter (HOSPITAL_COMMUNITY): Payer: Self-pay | Admitting: *Deleted

## 2013-10-21 DIAGNOSIS — R0789 Other chest pain: Principal | ICD-10-CM | POA: Diagnosis present

## 2013-10-21 DIAGNOSIS — G8929 Other chronic pain: Secondary | ICD-10-CM | POA: Diagnosis present

## 2013-10-21 DIAGNOSIS — F319 Bipolar disorder, unspecified: Secondary | ICD-10-CM | POA: Diagnosis present

## 2013-10-21 DIAGNOSIS — R609 Edema, unspecified: Secondary | ICD-10-CM | POA: Diagnosis present

## 2013-10-21 DIAGNOSIS — F19239 Other psychoactive substance dependence with withdrawal, unspecified: Secondary | ICD-10-CM | POA: Diagnosis present

## 2013-10-21 DIAGNOSIS — Z79899 Other long term (current) drug therapy: Secondary | ICD-10-CM

## 2013-10-21 DIAGNOSIS — F1021 Alcohol dependence, in remission: Secondary | ICD-10-CM

## 2013-10-21 DIAGNOSIS — Z87891 Personal history of nicotine dependence: Secondary | ICD-10-CM

## 2013-10-21 DIAGNOSIS — F19939 Other psychoactive substance use, unspecified with withdrawal, unspecified: Secondary | ICD-10-CM | POA: Diagnosis present

## 2013-10-21 DIAGNOSIS — R079 Chest pain, unspecified: Secondary | ICD-10-CM

## 2013-10-21 DIAGNOSIS — G894 Chronic pain syndrome: Secondary | ICD-10-CM

## 2013-10-21 DIAGNOSIS — I10 Essential (primary) hypertension: Secondary | ICD-10-CM

## 2013-10-21 DIAGNOSIS — I252 Old myocardial infarction: Secondary | ICD-10-CM

## 2013-10-21 DIAGNOSIS — Z8249 Family history of ischemic heart disease and other diseases of the circulatory system: Secondary | ICD-10-CM

## 2013-10-21 DIAGNOSIS — G40909 Epilepsy, unspecified, not intractable, without status epilepticus: Secondary | ICD-10-CM | POA: Diagnosis present

## 2013-10-21 DIAGNOSIS — M549 Dorsalgia, unspecified: Secondary | ICD-10-CM | POA: Diagnosis present

## 2013-10-21 DIAGNOSIS — M7989 Other specified soft tissue disorders: Secondary | ICD-10-CM

## 2013-10-21 DIAGNOSIS — F411 Generalized anxiety disorder: Secondary | ICD-10-CM | POA: Diagnosis present

## 2013-10-21 HISTORY — DX: Acute myocardial infarction, unspecified: I21.9

## 2013-10-21 HISTORY — DX: Major depressive disorder, single episode, unspecified: F32.9

## 2013-10-21 HISTORY — DX: Gastro-esophageal reflux disease without esophagitis: K21.9

## 2013-10-21 HISTORY — DX: Depression, unspecified: F32.A

## 2013-10-21 HISTORY — DX: Anxiety disorder, unspecified: F41.9

## 2013-10-21 LAB — PRO B NATRIURETIC PEPTIDE: Pro B Natriuretic peptide (BNP): 12.4 pg/mL (ref 0–125)

## 2013-10-21 LAB — CBC
HCT: 40.6 % (ref 39.0–52.0)
Hemoglobin: 14.1 g/dL (ref 13.0–17.0)
MCH: 30.9 pg (ref 26.0–34.0)
MCHC: 34.7 g/dL (ref 30.0–36.0)
MCV: 88.8 fL (ref 78.0–100.0)
Platelets: 237 10*3/uL (ref 150–400)
RBC: 4.57 MIL/uL (ref 4.22–5.81)
RDW: 13.5 % (ref 11.5–15.5)
WBC: 9.2 10*3/uL (ref 4.0–10.5)

## 2013-10-21 LAB — TROPONIN I: Troponin I: <0.3 ng/mL (ref <0.30)

## 2013-10-21 LAB — BASIC METABOLIC PANEL
BUN: 14 mg/dL (ref 6–23)
CO2: 25 mEq/L (ref 19–32)
Calcium: 9.1 mg/dL (ref 8.4–10.5)
Chloride: 103 mEq/L (ref 96–112)
Creatinine, Ser: 0.8 mg/dL (ref 0.50–1.35)
GFR calc Af Amer: >90 mL/min (ref >90)
GFR calc non Af Amer: >90 mL/min (ref >90)
Glucose, Bld: 126 mg/dL — ABNORMAL HIGH (ref 70–99)
Potassium: 3.8 mEq/L (ref 3.7–5.3)
Sodium: 142 mEq/L (ref 137–147)

## 2013-10-21 MED ORDER — NITROGLYCERIN 2 % TD OINT
1.0000 [in_us] | TOPICAL_OINTMENT | Freq: Four times a day (QID) | TRANSDERMAL | Status: DC
  Administered 2013-10-22: 1 [in_us] via TOPICAL
  Filled 2013-10-21: qty 30

## 2013-10-21 MED ORDER — MORPHINE SULFATE 2 MG/ML IJ SOLN
2.0000 mg | INTRAMUSCULAR | Status: DC | PRN
  Administered 2013-10-21: 4 mg via INTRAVENOUS
  Filled 2013-10-21: qty 2

## 2013-10-21 MED ORDER — NITROGLYCERIN 0.4 MG SL SUBL
0.4000 mg | SUBLINGUAL_TABLET | SUBLINGUAL | Status: AC | PRN
  Administered 2013-10-21 (×3): 0.4 mg via SUBLINGUAL
  Filled 2013-10-21: qty 25
  Filled 2013-10-21: qty 1

## 2013-10-21 MED ORDER — ASPIRIN 325 MG PO TABS
325.0000 mg | ORAL_TABLET | Freq: Once | ORAL | Status: AC
  Administered 2013-10-21: 325 mg via ORAL
  Filled 2013-10-21: qty 1

## 2013-10-21 MED ORDER — NITROGLYCERIN 2 % TD OINT
1.0000 [in_us] | TOPICAL_OINTMENT | Freq: Once | TRANSDERMAL | Status: AC
  Administered 2013-10-21: 1 [in_us] via TOPICAL
  Filled 2013-10-21: qty 1

## 2013-10-21 MED ORDER — MORPHINE SULFATE 4 MG/ML IJ SOLN
4.0000 mg | Freq: Once | INTRAMUSCULAR | Status: AC
  Administered 2013-10-21: 4 mg via INTRAVENOUS
  Filled 2013-10-21: qty 1

## 2013-10-21 NOTE — ED Notes (Signed)
Patient states that the nitro helped with his chest pain, but his legs are still an 8/10

## 2013-10-21 NOTE — Progress Notes (Signed)
Pt with chest pain. Needs to come in for ACS rule out. Telemetry bed requested.  Scott PrestoMAGICK-Leinaala Catanese, MD  [facility] Pager [phone]  If 7PM-7AM, please contact night-coverage [email or web address] Password [email or web address]

## 2013-10-21 NOTE — ED Provider Notes (Signed)
CSN: [id number]     Arrival date & time 10/21/13  1926 History   First MD Initiated Contact with Patient 10/21/13 1938     This chart was scribed for Dagmar Hait, MD by Arlan Organ, ED Scribe. This patient was seen in room MH12/MH12 and the patient's care was started 7:48 PM.   Chief Complaint  Patient presents with  . Chest Pain  . Leg Swelling   Patient is a 43 y.o. male presenting with chest pain. The history is provided by the patient. No language interpreter was used.  Chest Pain Pain location:  Substernal area Pain quality: aching and throbbing   Pain radiates to:  Does not radiate Pain radiates to the back: no   Pain severity:  Moderate Duration:  5 hours Timing:  Constant Progression:  Unchanged Relieved by:  Nothing Worsened by:  Nothing tried Associated symptoms: shortness of breath   Associated symptoms: no cough, no fever, no nausea and not vomiting     HPI Comments: Ari Bernabei is a 43 y.o. male who presents to the Emergency Department complaining of bilateral leg swelling onset this morning. He reports associated throbbing pain rated 8/10 to his lower extremities bilaterally. Pt has also noted constant central CP at rest onset 5 hours. He describes this pain as aching and throbbing. He has tried Tylenol with no improvement. Denies trying any Nitro or Aspirin for his CP. He reports a PMHx of an MI described as "an elephant sitting on my chest". States this current pain is different from the pain he experienced with his MI. He denies having a heart cath or stents placed after past MI. Pt was prescribed medications which he has stopped taking. At this time he denies any nausea or vomiting. Denies a history of CHF, but reports a strong family history heart problems. Denies a person history of blood clots. Pt has a PMHx of seizures, Bipolar 1 disorder, herniated discs, and history of ETOH abuse.  Past Medical History  Diagnosis Date  . Back pain   . Seizures   .  History of ETOH abuse   . Bipolar 1 disorder    Past Surgical History  Procedure Laterality Date  . Back surgery    . Appendectomy    . Abdominal surgery     No family history on file. History  Substance Use Topics  . Smoking status: Former Smoker -- 0.50 packs/day    Types: Cigarettes  . Smokeless tobacco: Not on file  . Alcohol Use: No     Comment: completed [facility]    Review of Systems  Constitutional: Negative for fever and chills.  HENT: Negative for congestion.   Eyes: Negative for redness.  Respiratory: Positive for shortness of breath. Negative for cough.   Cardiovascular: Positive for chest pain and leg swelling.  Gastrointestinal: Negative for nausea and vomiting.  All other systems reviewed and are negative.      Allergies  Benadryl; Prednisone; and Nsaids  Home Medications   Current Outpatient Rx  Name  Route  Sig  Dispense  Refill  . acetaminophen (TYLENOL) 325 MG tablet   Oral   Take 650 mg by mouth every 4 (four) hours as needed.         Marland Kitchen asenapine (SAPHRIS) 5 MG SUBL   Sublingual   Place 5 mg under the tongue 2 (two) times daily.         . citalopram (CELEXA) 20 MG tablet   Oral   Take  20 mg by mouth daily.         Marland Kitchen. gabapentin (NEURONTIN) 300 MG capsule   Oral   Take 300 mg by mouth 3 (three) times daily.         Marland Kitchen. HYDROcodone-acetaminophen (NORCO/VICODIN) 5-325 MG per tablet   Oral   Take 1 tablet by mouth every 6 (six) hours as needed.   20 tablet   0   . methocarbamol (ROBAXIN) 750 MG tablet   Oral   Take 1 tablet (750 mg total) by mouth 3 (three) times daily.   21 tablet   0   . OXcarbazepine (TRILEPTAL PO)   Oral   Take by mouth.         . phenytoin (DILANTIN) 100 MG ER capsule   Oral   Take 100 mg by mouth 3 (three) times daily.          . traMADol (ULTRAM) 50 MG tablet      1-2 tabs po q 4-6 hrs prn pain (max 400 mg/24 hrs)   20 tablet   0   . traZODone (DESYREL) 100 MG tablet   Oral   Take  100 mg by mouth at bedtime.          Triage Vitals: BP 150/81  Pulse 91  Temp(Src) 97.8 F (36.6 C) (Oral)  Resp 20  Ht 6' (1.829 m)  Wt 275 lb (124.739 kg)  BMI 37.29 kg/m2  SpO2 98%   Physical Exam  Nursing note and vitals reviewed. Constitutional: He is oriented to person, place, and time. He appears well-developed and well-nourished.  HENT:  Head: Normocephalic and atraumatic.  Eyes: EOM are normal.  Neck: Normal range of motion.  Cardiovascular: Normal rate, regular rhythm, normal heart sounds and intact distal pulses.   Pulmonary/Chest: Effort normal and breath sounds normal. No respiratory distress. He has no wheezes. He has no rales.  Abdominal: Soft. He exhibits no distension. There is no tenderness.  Musculoskeletal: Normal range of motion.  Neurological: He is alert and oriented to person, place, and time.  Skin: Skin is warm and dry.  Psychiatric: He has a normal mood and affect. Judgment normal.    ED Course  Procedures (including critical care time)  DIAGNOSTIC STUDIES: Oxygen Saturation is 98% on RA, Normal by my interpretation.    COORDINATION OF CARE: 8:00 PM-Discussed treatment plan with pt at bedside and pt agreed to plan.     Labs Review Labs Reviewed  BASIC METABOLIC PANEL - Abnormal; Notable for the following:    Glucose, Bld 126 (*)    All other components within normal limits  CBC  TROPONIN I  PRO B NATRIURETIC PEPTIDE   Imaging Review Dg Chest Port 1 View  10/21/2013   CLINICAL DATA:  Chest pain.  Leg swelling.  EXAM: PORTABLE CHEST - 1 VIEW  COMPARISON:  09/08/2013  FINDINGS: The heart size and mediastinal contours are within normal limits. Both lungs are clear. The visualized skeletal structures are unremarkable.  IMPRESSION: Normal exam.   Electronically Signed   By: Geanie CooleyJim  Maxwell M.D.   On: 10/21/2013 20:16     EKG Interpretation   Date/Time:  Sunday October 21 2013 19:32:33 EDT Ventricular Rate:  72 PR Interval:  140 QRS Duration:  106 QT Interval:  414 QTC Calculation: 453 R Axis:   0 Text Interpretation:  Normal sinus rhythm Low voltage QRS Cannot rule out  Anterior infarct , age undetermined T wave inversion, T wave flattening in  V2/V3,  changed from prior Confirmed by [facility]  MD, Sheana Bir [id number]) on  10/21/2013 7:39:53 PM      MDM   Final diagnoses:  Chest pain    32M with hx of MI in [place], multiple Psych conditions presents with CP. Began at rest, described as throbbing, radiating to neck. No alleviating/exacerbating factors. Strong family hx of cardiac disease. Here vitals stable. Nitroglycerin helping with pain, lowered down to 3/10, given morphine and nitroglycerin paste applied. NTG induced severe headache.  EKG with V2 T wave inversion and nonspecific flattening of V3. Normal troponin. Hx of degenerative spinal disease and back pain, doubt leg pain with his CP is c/w aortic dissection. Dr. Donnie Aho with Cards states this patient is appropriate for medicine. Medicine, Dr. Izola Price, admitting.  I personally performed the services described in this documentation, which was scribed in my presence. The recorded information has been reviewed and is accurate.     Dagmar Hait, MD 10/22/13 0005

## 2013-10-21 NOTE — ED Notes (Signed)
Patient states his chest pain has subsided, but not his legs.

## 2013-10-21 NOTE — ED Notes (Signed)
Pt reports 3 hour history of "pounding" chest pain and bilateral leg swelling- [facility]- past hx of heart attack- states this pain is different

## 2013-10-22 ENCOUNTER — Encounter (HOSPITAL_COMMUNITY): Payer: Self-pay | Admitting: Internal Medicine

## 2013-10-22 ENCOUNTER — Inpatient Hospital Stay (HOSPITAL_COMMUNITY): Payer: Medicare Other

## 2013-10-22 DIAGNOSIS — F19939 Other psychoactive substance use, unspecified with withdrawal, unspecified: Secondary | ICD-10-CM

## 2013-10-22 DIAGNOSIS — I517 Cardiomegaly: Secondary | ICD-10-CM

## 2013-10-22 DIAGNOSIS — F1021 Alcohol dependence, in remission: Secondary | ICD-10-CM | POA: Diagnosis present

## 2013-10-22 DIAGNOSIS — I10 Essential (primary) hypertension: Secondary | ICD-10-CM

## 2013-10-22 DIAGNOSIS — G894 Chronic pain syndrome: Secondary | ICD-10-CM | POA: Diagnosis present

## 2013-10-22 DIAGNOSIS — G40909 Epilepsy, unspecified, not intractable, without status epilepticus: Secondary | ICD-10-CM | POA: Diagnosis present

## 2013-10-22 DIAGNOSIS — R079 Chest pain, unspecified: Secondary | ICD-10-CM

## 2013-10-22 DIAGNOSIS — F19239 Other psychoactive substance dependence with withdrawal, unspecified: Secondary | ICD-10-CM | POA: Diagnosis present

## 2013-10-22 DIAGNOSIS — M7989 Other specified soft tissue disorders: Secondary | ICD-10-CM

## 2013-10-22 DIAGNOSIS — F319 Bipolar disorder, unspecified: Secondary | ICD-10-CM | POA: Diagnosis present

## 2013-10-22 LAB — URINALYSIS, ROUTINE W REFLEX MICROSCOPIC
Bilirubin Urine: NEGATIVE
Glucose, UA: NEGATIVE mg/dL
Hgb urine dipstick: NEGATIVE
Ketones, ur: NEGATIVE mg/dL
Leukocytes, UA: NEGATIVE
Nitrite: NEGATIVE
Protein, ur: NEGATIVE mg/dL
Specific Gravity, Urine: 1.031 — ABNORMAL HIGH (ref 1.005–1.030)
Urobilinogen, UA: 0.2 mg/dL (ref 0.0–1.0)
pH: 5 (ref 5.0–8.0)

## 2013-10-22 LAB — COMPREHENSIVE METABOLIC PANEL
ALT: 28 U/L (ref 0–53)
AST: 17 U/L (ref 0–37)
Albumin: 3.4 g/dL — ABNORMAL LOW (ref 3.5–5.2)
Alkaline Phosphatase: 67 U/L (ref 39–117)
BUN: 13 mg/dL (ref 6–23)
CO2: 23 mEq/L (ref 19–32)
Calcium: 8.6 mg/dL (ref 8.4–10.5)
Chloride: 103 mEq/L (ref 96–112)
Creatinine, Ser: 0.79 mg/dL (ref 0.50–1.35)
GFR calc Af Amer: >90 mL/min (ref >90)
GFR calc non Af Amer: >90 mL/min (ref >90)
Glucose, Bld: 85 mg/dL (ref 70–99)
Potassium: 4.3 mEq/L (ref 3.7–5.3)
Sodium: 139 mEq/L (ref 137–147)
Total Bilirubin: 0.7 mg/dL (ref 0.3–1.2)
Total Protein: 6.4 g/dL (ref 6.0–8.3)

## 2013-10-22 LAB — CBC WITH DIFFERENTIAL/PLATELET
Basophils Absolute: 0 10*3/uL (ref 0.0–0.1)
Basophils Relative: 0 % (ref 0–1)
Eosinophils Absolute: 0.2 10*3/uL (ref 0.0–0.7)
Eosinophils Relative: 2 % (ref 0–5)
HCT: 40.1 % (ref 39.0–52.0)
Hemoglobin: 13.9 g/dL (ref 13.0–17.0)
Lymphocytes Relative: 40 % (ref 12–46)
Lymphs Abs: 2.8 10*3/uL (ref 0.7–4.0)
MCH: 30.8 pg (ref 26.0–34.0)
MCHC: 34.7 g/dL (ref 30.0–36.0)
MCV: 88.7 fL (ref 78.0–100.0)
Monocytes Absolute: 0.6 10*3/uL (ref 0.1–1.0)
Monocytes Relative: 8 % (ref 3–12)
Neutro Abs: 3.4 10*3/uL (ref 1.7–7.7)
Neutrophils Relative %: 49 % (ref 43–77)
Platelets: 230 10*3/uL (ref 150–400)
RBC: 4.52 MIL/uL (ref 4.22–5.81)
RDW: 13.5 % (ref 11.5–15.5)
WBC: 7 10*3/uL (ref 4.0–10.5)

## 2013-10-22 LAB — TROPONIN I
Troponin I: <0.3 ng/mL (ref <0.30)
Troponin I: <0.3 ng/mL (ref <0.30)
Troponin I: <0.3 ng/mL (ref <0.30)

## 2013-10-22 LAB — TSH: TSH: 1.193 u[IU]/mL (ref 0.350–4.500)

## 2013-10-22 MED ORDER — REGADENOSON 0.4 MG/5ML IV SOLN
0.4000 mg | Freq: Once | INTRAVENOUS | Status: AC
  Filled 2013-10-22: qty 5

## 2013-10-22 MED ORDER — SODIUM CHLORIDE 0.9 % IV SOLN: 250.0000 mL | INTRAVENOUS | Status: DC | PRN

## 2013-10-22 MED ORDER — FUROSEMIDE 40 MG PO TABS
40.0000 mg | ORAL_TABLET | Freq: Two times a day (BID) | ORAL | Status: DC
  Administered 2013-10-22 – 2013-10-23 (×3): 40 mg via ORAL
  Filled 2013-10-22 (×5): qty 1

## 2013-10-22 MED ORDER — SODIUM CHLORIDE 0.9 % IJ SOLN
3.0000 mL | Freq: Two times a day (BID) | INTRAMUSCULAR | Status: DC
  Administered 2013-10-22 – 2013-10-23 (×2): 3 mL via INTRAVENOUS

## 2013-10-22 MED ORDER — OXCARBAZEPINE 150 MG PO TABS
150.0000 mg | ORAL_TABLET | Freq: Two times a day (BID) | ORAL | Status: DC
  Administered 2013-10-22 – 2013-10-23 (×4): 150 mg via ORAL
  Filled 2013-10-22 (×5): qty 1

## 2013-10-22 MED ORDER — PANTOPRAZOLE SODIUM 40 MG PO TBEC
40.0000 mg | DELAYED_RELEASE_TABLET | Freq: Every day | ORAL | Status: DC
  Administered 2013-10-22 – 2013-10-23 (×2): 40 mg via ORAL
  Filled 2013-10-22 (×2): qty 1

## 2013-10-22 MED ORDER — REGADENOSON 0.4 MG/5ML IV SOLN
0.4000 mg | Freq: Once | INTRAVENOUS | Status: AC
  Administered 2013-10-22: 0.4 mg via INTRAVENOUS

## 2013-10-22 MED ORDER — VITAMIN B-1 100 MG PO TABS
100.0000 mg | ORAL_TABLET | Freq: Every day | ORAL | Status: DC
  Administered 2013-10-22 – 2013-10-23 (×2): 100 mg via ORAL
  Filled 2013-10-22 (×2): qty 1

## 2013-10-22 MED ORDER — SODIUM CHLORIDE 0.9 % IJ SOLN: 3.0000 mL | INTRAMUSCULAR | Status: DC | PRN

## 2013-10-22 MED ORDER — IOHEXOL 350 MG/ML SOLN
100.0000 mL | Freq: Once | INTRAVENOUS | Status: AC | PRN
  Administered 2013-10-22: 100 mL via INTRAVENOUS

## 2013-10-22 MED ORDER — GABAPENTIN 400 MG PO CAPS
800.0000 mg | ORAL_CAPSULE | Freq: Three times a day (TID) | ORAL | Status: DC
  Administered 2013-10-22 – 2013-10-23 (×5): 800 mg via ORAL
  Filled 2013-10-22 (×7): qty 2

## 2013-10-22 MED ORDER — LISINOPRIL 2.5 MG PO TABS
2.5000 mg | ORAL_TABLET | Freq: Every day | ORAL | Status: DC
  Administered 2013-10-22 – 2013-10-23 (×2): 2.5 mg via ORAL
  Filled 2013-10-22 (×2): qty 1

## 2013-10-22 MED ORDER — REGADENOSON 0.4 MG/5ML IV SOLN
INTRAVENOUS | Status: AC
  Filled 2013-10-22: qty 5

## 2013-10-22 MED ORDER — OXYCODONE HCL 5 MG PO TABS
20.0000 mg | ORAL_TABLET | Freq: Four times a day (QID) | ORAL | Status: DC | PRN
  Administered 2013-10-22 – 2013-10-23 (×7): 20 mg via ORAL
  Filled 2013-10-22 (×7): qty 4

## 2013-10-22 MED ORDER — CITALOPRAM HYDROBROMIDE 20 MG PO TABS
20.0000 mg | ORAL_TABLET | Freq: Every day | ORAL | Status: DC
  Administered 2013-10-22 – 2013-10-23 (×2): 20 mg via ORAL
  Filled 2013-10-22 (×2): qty 1

## 2013-10-22 MED ORDER — ASPIRIN EC 81 MG PO TBEC
81.0000 mg | DELAYED_RELEASE_TABLET | Freq: Every day | ORAL | Status: DC
  Administered 2013-10-22 – 2013-10-23 (×2): 81 mg via ORAL
  Filled 2013-10-22 (×2): qty 1

## 2013-10-22 MED ORDER — TECHNETIUM TC 99M SESTAMIBI GENERIC - CARDIOLITE
30.0000 | Freq: Once | INTRAVENOUS | Status: AC | PRN
  Administered 2013-10-22: 30 via INTRAVENOUS

## 2013-10-22 MED ORDER — BUSPIRONE HCL 15 MG PO TABS
15.0000 mg | ORAL_TABLET | Freq: Three times a day (TID) | ORAL | Status: DC
  Administered 2013-10-22 – 2013-10-23 (×5): 15 mg via ORAL
  Filled 2013-10-22 (×7): qty 1

## 2013-10-22 MED ORDER — TRAZODONE HCL 100 MG PO TABS
200.0000 mg | ORAL_TABLET | Freq: Every day | ORAL | Status: DC
  Filled 2013-10-22 (×3): qty 2

## 2013-10-22 NOTE — Progress Notes (Signed)
Patient lying in bed asleep.  NAD noted.  Scott [name], Scott [name]

## 2013-10-22 NOTE — Consult Note (Signed)
CONSULT NOTE  Date: 10/22/2013               Patient Name:  Scott [name] MRN: [id number]  DOB: Dec 22, 1970 Age / Sex: 43 y.o., male        PCP: No primary provider on file. Primary Cardiologist: New/Nahser            Referring Physician: Hongalgi              Reason for Consult: CP           History of Present Illness: Patient is a 43 y.o. male with a PMHx of history of alcohol abuse, bipolar 1 disease, history of myocardial infarction, anxiety, depression, gastroesophageal reflux disease, seizures, who was admitted to [facility] on 10/21/2013 for evaluation of chest discomfort.  . The patient has been in a rehabilitation facility for the past 4 days because of alcohol abuse. He has a history of chronic back pain and has been on chronic narcotics. The facility has not allowed him to take his narcotics.  The patient the patient was brought to the [facility] [place] with sharp, aching, throbbing chest pain. The pain was not pleuritic. It was worse with sitting up.   Medications: Outpatient medications: Prescriptions prior to admission  Medication Sig Dispense Refill  . acetaminophen (TYLENOL) 325 MG tablet Take 650 mg by mouth every 4 (four) hours as needed.      . busPIRone (BUSPAR) 15 MG tablet Take 15 mg by mouth 3 (three) times daily.      . citalopram (CELEXA) 20 MG tablet Take 20 mg by mouth daily.      Marland Kitchen gabapentin (NEURONTIN) 300 MG capsule Take 800 mg by mouth 3 (three) times daily.       Marland Kitchen HYDROcodone-acetaminophen (NORCO/VICODIN) 5-325 MG per tablet Take 1 tablet by mouth every 6 (six) hours as needed.  20 tablet  0  . methocarbamol (ROBAXIN) 750 MG tablet Take 1 tablet (750 mg total) by mouth 3 (three) times daily.  21 tablet  0  . OXcarbazepine (TRILEPTAL PO) Take 150 mg by mouth 2 (two) times daily.       . traZODone (DESYREL) 100 MG tablet Take 200 mg by mouth at bedtime.         Current medications: Current Facility-Administered Medications  Medication Dose  Route Frequency Provider Last Rate Last Dose  . 0.9 %  sodium chloride infusion  250 mL Intravenous PRN Therisa Doyne, MD      . aspirin EC tablet 81 mg  81 mg Oral Daily Therisa Doyne, MD      . busPIRone (BUSPAR) tablet 15 mg  15 mg Oral TID Therisa Doyne, MD   15 mg at 10/22/13 0131  . citalopram (CELEXA) tablet 20 mg  20 mg Oral Daily Therisa Doyne, MD      . furosemide (LASIX) tablet 40 mg  40 mg Oral BID Therisa Doyne, MD      . gabapentin (NEURONTIN) capsule 800 mg  800 mg Oral TID Therisa Doyne, MD   800 mg at 10/22/13 0132  . lisinopril (PRINIVIL,ZESTRIL) tablet 2.5 mg  2.5 mg Oral Daily Therisa Doyne, MD      . nitroGLYCERIN (NITROGLYN) 2 % ointment 1 inch  1 inch Topical 4 times per day Therisa Doyne, MD   1 inch at 10/22/13 0618  . OXcarbazepine (TRILEPTAL) tablet 150 mg  150 mg Oral BID Therisa Doyne, MD   150 mg at 10/22/13 0132  .  oxyCODONE (Oxy IR/ROXICODONE) immediate release tablet 20 mg  20 mg Oral Q6H PRN Therisa Doyne, MD   20 mg at 10/22/13 0618  . pantoprazole (PROTONIX) EC tablet 40 mg  40 mg Oral Q1200 Anastassia Doutova, MD      . sodium chloride 0.9 % injection 3 mL  3 mL Intravenous Q12H Anastassia Doutova, MD      . sodium chloride 0.9 % injection 3 mL  3 mL Intravenous PRN Therisa Doyne, MD      . traZODone (DESYREL) tablet 200 mg  200 mg Oral QHS Therisa Doyne, MD         Allergies  Allergen Reactions  . Benadryl [Diphenhydramine Hcl] Anaphylaxis and Other (See Comments)    Muscles lock up.  . Prednisone Other (See Comments)    Severe Anger  . Nsaids Rash     Past Medical History  Diagnosis Date  . Back pain   . Seizures   . History of ETOH abuse   . Bipolar 1 disorder   . Myocardial infarction   . Anxiety   . Depression   . GERD (gastroesophageal reflux disease)     Past Surgical History  Procedure Laterality Date  . Back surgery    . Appendectomy    . Abdominal surgery      Family  History  Problem Relation Age of Onset  . Hypertension Mother   . CAD Father   . COPD Father   . Stroke Father   . Testicular cancer Brother     Social History:  reports that he has quit smoking. His smoking use included Cigarettes. He smoked 0.50 packs per day. He does not have any smokeless tobacco history on file. He reports that he drinks alcohol. He reports that he does not use illicit drugs.   Review of Systems: Constitutional:  denies fever, chills, diaphoresis, appetite change and fatigue.  HEENT: denies photophobia, eye pain, redness, hearing loss, ear pain, congestion, sore throat, rhinorrhea, sneezing, neck pain, neck stiffness and tinnitus.  Respiratory: admits to SOB,   Cardiovascular: admits to chest pain,   Gastrointestinal: admits to nausea, vomiting, abdominal pain, diarrhea,  Genitourinary: denies dysuria, urgency, frequency, hematuria, flank pain and difficulty urinating.  Musculoskeletal: admits to  myalgias, back pain, joint swelling, arthralgias and gait problem.   Skin: denies pallor, rash and wound.  Neurological: denies dizziness, seizures, syncope, weakness, light-headedness, numbness and headaches.   Hematological: denies adenopathy, easy bruising, personal or family bleeding history.  Psychiatric/ Behavioral: denies suicidal ideation, mood changes, confusion, nervousness, sleep disturbance and agitation.    Physical Exam: BP 114/68  Pulse 60  Temp(Src) 97.7 F (36.5 C) (Oral)  Resp 16  Ht 6' (1.829 m)  Wt 287 lb 4.8 oz (130.318 kg)  BMI 38.96 kg/m2  SpO2 94%  Wt Readings from Last 3 Encounters:  10/21/13 287 lb 4.8 oz (130.318 kg)  09/04/13 265 lb (120.203 kg)  01/04/13 290 lb (131.543 kg)    General: Vital signs reviewed and noted. Well-developed, well-nourished, in no acute distress; alert,   Head: Normocephalic, atraumatic, sclera anicteric,   Neck: Supple. Negative for carotid bruits. No JVD   Lungs:  Clear bilaterally, no  wheezes,  rales, or rhonchi. Breathing is normal   Heart: RRR with S1 S2. No murmurs, rubs, or gallops   Abdomen:  Soft, non-tender, non-distended with normoactive bowel sounds. No hepatomegaly. No rebound/guarding. No obvious abdominal masses   MSK: Strength and the appear normal for age.   Extremities: No  clubbing or cyanosis. No edema.  Distal pedal pulses are 2+ and equal   Neurologic: Alert and oriented X 3. Moves all extremities spontaneously.  Psych: Responds to questions appropriately with a normal affect.     Lab results: Basic Metabolic Panel:  Recent Labs Lab 10/21/13 1950 10/22/13 0500  NA 142 139  K 3.8 4.3  CL 103 103  CO2 25 23  GLUCOSE 126* 85  BUN 14 13  CREATININE 0.80 0.79  CALCIUM 9.1 8.6    Liver Function Tests:  Recent Labs Lab 10/22/13 0500  AST 17  ALT 28  ALKPHOS 67  BILITOT 0.7  PROT 6.4  ALBUMIN 3.4*   No results found for this basename: LIPASE, AMYLASE,  in the last 168 hours No results found for this basename: AMMONIA,  in the last 168 hours  CBC:  Recent Labs Lab 10/21/13 1950 10/22/13 0500  WBC 9.2 7.0  NEUTROABS  --  3.4  HGB 14.1 13.9  HCT 40.6 40.1  MCV 88.8 88.7  PLT 237 230    Cardiac Enzymes:  Recent Labs Lab 10/21/13 0133 10/21/13 1950 10/22/13 0500  TROPONINI <0.30 <0.30 <0.30    BNP: No components found with this basename: POCBNP,   CBG: No results found for this basename: GLUCAP,  in the last 168 hours  Coagulation Studies: No results found for this basename: LABPROT, INR,  in the last 72 hours   Other results: EKG:  NSR, no ST or T wave changes.  Imaging: Dg Chest Port 1 View  10/21/2013   CLINICAL DATA:  Chest pain.  Leg swelling.  EXAM: PORTABLE CHEST - 1 VIEW  COMPARISON:  09/08/2013  FINDINGS: The heart size and mediastinal contours are within normal limits. Both lungs are clear. The visualized skeletal structures are unremarkable.  IMPRESSION: Normal exam.   Electronically Signed   By: Geanie Cooley  M.D.   On: 10/21/2013 20:16   Ct Angio Chest Aortic Dissect W &/or W/o  10/22/2013   CLINICAL DATA:  Pulsating chest pain.  EXAM: CT ANGIOGRAPHY CHEST WITH CONTRAST  TECHNIQUE: Multidetector CT imaging of the chest was performed using the standard protocol during bolus administration of intravenous contrast. Multiplanar CT image reconstructions and MIPs were obtained to evaluate the vascular anatomy.  CONTRAST:  100 mL OMNIPAQUE IOHEXOL 350 MG/ML SOLN  COMPARISON:  CT chest 02/13/2010.  FINDINGS: There is no aortic dissection or aneurysm. No pleural or pericardial effusion. No pulmonary embolus is identified. Enlarged, heterogeneous left lobe of the thyroid is unchanged. There is no axillary, hilar or mediastinal lymphadenopathy. The lungs demonstrate only mild dependent atelectasis. Visualized upper abdomen shows fatty infiltration of the liver. No focal bony abnormality is identified.  Review of the MIP images confirms the above findings.  IMPRESSION: Negative for aortic dissection or aneurysm.  No acute finding.  Fatty infiltration of the liver.   Electronically Signed   By: Drusilla Kanner M.D.   On: 10/22/2013 01:26      Assessment & Plan:  1. Chest pain:  The patient presents with rather atypical episodes of chest discomfort. His cardiac enzymes are negative. His EKG is unremarkable.  He stated that he's been having some exertional chest pain when he walks. He is unable to exercise vigorously because of chronic back pain but he has noticed some chest tightness for the past year. He made an appointment to see a cardiologist with [facility] cardiology in the next week or so.   CT angio was negative.  While his pains are somewhat atypical, he's been having exertional chest pain off and on for the past year. At this point, our best option is to proceed with a Lexiscan myoview study.  It  will need to be a 2 day study because of his size.    Vesta MixerPhilip J. Nahser, Montez HagemanJr., MD, [facility] ServicesFACC 10/22/2013, 8:20  AM Office - [phone] Pager [phone]- [phone]

## 2013-10-22 NOTE — H&P (Signed)
PCP: [facility]   Chief Complaint:  Chest pain  HPI: Scott [name] is a 43 y.o. male   has a past medical history of Back pain; Seizures; History of ETOH abuse; Bipolar 1 disorder; Myocardial infarction; Anxiety; Depression; and GERD (gastroesophageal reflux disease).   Presented with  patient states he had a slight heart attack 3 years ago while at [place] for the past 2 years he have had episodes of shortness of breath and bilateral leg swelling which is episodic. Patient has intentionally lost 60 lbs and when his weight comes back the swelling increases. He feels more short of breath when he is laying down flat. He has occasional symptoms of getting up in the middle of the night short of breath. He has occasional sensation of abdominal swelling and distention. He has hx of chronic back pain and has hard time exercising.   Patient reports having episodes of binge drinking try to put himself to sleep. He felt that this was a problem and got himself admitted to inpatient rehabilitation facility where he has been for past 4 days. Of note patient has history of chronic back pain and is on chronic narcotics which equivalent of 30 oxycodone twice daily. While at that facility he was not allowed to take his pain medications. While at the facility she started to notice diarrhea generalized malaise aches, cough, hot and cold flashes, palpitations.  This AM patient noted generalized fatigue and his legs throbbing bilaterally with pitting edema. He noted that his chest was hurting at that time as well. By noon his pain got to be really bad. Describes sharp aching throbbing/pulsating chest pain not worse with breathing but worse with sitting up. Patient was [facility] to [facility].  His symptoms have improved after receiving nitroglycerin and morphine. [facility]. Cardiac enzymes have been unremarkable EKG showing T wave  inversions in lead V2 and V3 Review of Systems:    Pertinent positives include:  Constitutional:  No weight loss, night sweats, Fevers, chills, fatigue, weight loss  HEENT:  No headaches, Difficulty swallowing,Tooth/dental problems,Sore throat,  No sneezing, itching, ear ache, nasal congestion, post nasal drip,  Cardio-vascular:  No chest pain, Orthopnea, PND, anasarca, dizziness, palpitations.no Bilateral lower extremity swelling  GI:  No heartburn, indigestion, abdominal pain, nausea, vomiting, diarrhea, change in bowel habits, loss of appetite, melena, blood in stool, hematemesis Resp:  no shortness of breath at rest. No dyspnea on exertion, No excess mucus, no productive cough, No non-productive cough, No coughing up of blood.No change in color of mucus.No wheezing. Skin:  no rash or lesions. No jaundice GU:  no dysuria, change in color of urine, no urgency or frequency. No straining to urinate.  No flank pain.  Musculoskeletal:  No joint pain or no joint swelling. No decreased range of motion. No back pain.  Psych:  No change in mood or affect. No depression or anxiety. No memory loss.  Neuro: no localizing neurological complaints, no tingling, no weakness, no double vision, no gait abnormality, no slurred speech, no confusion  Otherwise ROS are negative except for above, 10 systems were reviewed  Past Medical History: Past Medical History  Diagnosis Date  . Back pain   . Seizures   . History of ETOH abuse   . Bipolar 1 disorder   . Myocardial infarction   . Anxiety   . Depression   . GERD (gastroesophageal reflux disease)    Past  Surgical History  Procedure Laterality Date  . Back surgery    . Appendectomy    . Abdominal surgery       Medications: Prior to Admission medications   Medication Sig Start Date End Date Taking? Authorizing Provider  busPIRone (BUSPAR) 15 MG tablet Take 15 mg by mouth 3 (three) times daily.   Yes Historical Provider, MD   acetaminophen (TYLENOL) 325 MG tablet Take 650 mg by mouth every 4 (four) hours as needed.    Historical Provider, MD  citalopram (CELEXA) 20 MG tablet Take 20 mg by mouth daily.    Historical Provider, MD  gabapentin (NEURONTIN) 300 MG capsule Take 800 mg by mouth 3 (three) times daily.     Historical Provider, MD  HYDROcodone-acetaminophen (NORCO/VICODIN) 5-325 MG per tablet Take 1 tablet by mouth every 6 (six) hours as needed. 09/04/13   Elson Areas, PA-C  methocarbamol (ROBAXIN) 750 MG tablet Take 1 tablet (750 mg total) by mouth 3 (three) times daily. 09/04/13   Elson Areas, PA-C  OXcarbazepine (TRILEPTAL PO) Take 150 mg by mouth 2 (two) times daily.     Historical Provider, MD  traZODone (DESYREL) 100 MG tablet Take 200 mg by mouth at bedtime.     Historical Provider, MD    Allergies:   Allergies  Allergen Reactions  . Benadryl [Diphenhydramine Hcl] Anaphylaxis and Other (See Comments)    Muscles lock up.  . Prednisone Other (See Comments)    Severe Anger  . Nsaids Rash    Social History:  Ambulatory   independently   Lives at  home   reports that he has quit smoking. His smoking use included Cigarettes. He smoked 0.50 packs per day. He does not have any smokeless tobacco history on file. He reports that he drinks alcohol. He reports that he does not use illicit drugs.   Family History: family history includes CAD in his father; COPD in his father; Hypertension in his mother; Stroke in his father; Testicular cancer in his brother.    Physical Exam: Patient Vitals for the past 24 hrs:  BP Temp Temp src Pulse Resp SpO2 Height Weight  10/21/13 2239 133/74 mmHg 98.1 F (36.7 C) Oral 62 18 99 % 6' (1.829 m) 130.318 kg (287 lb 4.8 oz)  10/21/13 2029 135/68 mmHg - - 77 18 96 % - -  10/21/13 2017 135/68 mmHg - - 75 20 97 % - -  10/21/13 2012 142/71 mmHg - - 71 - 97 % - -  10/21/13 1945 - - - - - - 6' (1.829 m) 124.739 kg (275 lb)  10/21/13 1943 150/81 mmHg 97.8 F (36.6  C) Oral 91 20 98 % - -    1. General:  in No Acute distress 2. Psychological: Alert and  Oriented 3. Head/ENT:   Moist  Mucous Membranes                          Head Non traumatic, neck supple                          Normal   Dentition 4. SKIN:  Normal Skin turgor,  Skin clean Dry and intact no rash 5. Heart: Regular rate and rhythm no Murmur, Rub or gallop 6. Lungs: Clear to auscultation bilaterally, no wheezes or crackles   7. Abdomen: Soft, non-tender, Non distended obese 8. Lower extremities: no clubbing, cyanosis, trace edema 9.  Neurologically Grossly intact, moving all 4 extremities equally 10. MSK: Normal range of motion  body mass index is 38.96 kg/(m^2).   Labs on Admission:   Recent Labs  10/21/13 1950  NA 142  K 3.8  CL 103  CO2 25  GLUCOSE 126*  BUN 14  CREATININE 0.80  CALCIUM 9.1   No results found for this basename: AST, ALT, ALKPHOS, BILITOT, PROT, ALBUMIN,  in the last 72 hours No results found for this basename: LIPASE, AMYLASE,  in the last 72 hours  Recent Labs  10/21/13 1950  WBC 9.2  HGB 14.1  HCT 40.6  MCV 88.8  PLT 237    Recent Labs  10/21/13 1950  TROPONINI <0.30   No results found for this basename: TSH, T4TOTAL, FREET3, T3FREE, THYROIDAB,  in the last 72 hours No results found for this basename: VITAMINB12, FOLATE, FERRITIN, TIBC, IRON, RETICCTPCT,  in the last 72 hours No results found for this basename: HGBA1C    Estimated Creatinine Clearance: 169.6 ml/min (by C-G formula based on Cr of 0.8). ABG    Component Value Date/Time   TCO2 23 02/11/2010 1512     No results found for this basename: DDIMER     Other results:  I have pearsonaly reviewed this: ECG REPORT  Rate: 72   Rhythm: Low-voltage but normal sinus rhythm ST&T Change: T wave inversions in lead V3 and V2  BNP 12.4  Cultures:    Component Value Date/Time   SDES URINE, RANDOM 03/19/2008 1930   SPECREQUEST NONE 03/19/2008 1930   CULT NO GROWTH  03/19/2008 1930   REPTSTATUS 03/21/2008 FINAL 03/19/2008 1930       Radiological Exams on Admission: Dg Chest Port 1 View  10/21/2013   CLINICAL DATA:  Chest pain.  Leg swelling.  EXAM: PORTABLE CHEST - 1 VIEW  COMPARISON:  09/08/2013  FINDINGS: The heart size and mediastinal contours are within normal limits. Both lungs are clear. The visualized skeletal structures are unremarkable.  IMPRESSION: Normal exam.   Electronically Signed   By: Geanie Cooley M.D.   On: 10/21/2013 20:16    Chart has been reviewed  Assessment/Plan  43 year old male with the history of self-report remote MI, with symptoms worrisome for heart failure presents with chest pain in the setting of acute narcotic withdrawal.   Present on Admission:  . Chest pain - just been persistent given pulsatile in nature will obtain CT angiogram to evaluate for dissection cycle cardiac enzymes obtain serial EKG, echo gram, patient will likely benefit from further cardiac workup given his symptoms and questionable MI in the past with no history of cardiac catheterization. Make n.p.o. in case of need to procedure  . Leg swelling - will obtain echogram of the heart as well as obtain albumin given history of alcohol use  . Recovering alcoholic in remission - patient states last alcoholic drink was month and half ago. He has been at the facility for the past 4 days.  . Chronic pain syndrome - avoid IV pain medication if possible patient is tolerating by mouth well  . Seizure disorder - continue Trileptal  . Bipolar disorder, unspecified - continue home medications  . Withdrawal symptoms, drug or narcotic - restart oxycodone. The patient states that at home he takes equivalent of 30 mg of oxycodone twice daily. The does not have any prescription bottles with him. This needs to be confirmed by pharmacy  Prophylaxis:  SCD for now, Protonix  CODE STATUS: FULL CODE  Other  plan as per orders.  I have spent a total of 55 min on this  admission  Shaqueena Mauceri 10/22/2013, 12:09 AM

## 2013-10-22 NOTE — Progress Notes (Signed)
Echocardiogram 2D Echocardiogram has been performed.  Scott [name] 10/22/2013, 4:48 PM

## 2013-10-22 NOTE — Progress Notes (Signed)
UR Completed Elvert Cumpton Graves-Bigelow, RN,BSN [phone]  

## 2013-10-22 NOTE — Care Management Note (Unsigned)
    Page 1 of 1   10/22/2013     11:45:14 AM   CARE MANAGEMENT NOTE 10/22/2013  Patient:  Scott [name],Scott [name]   Account Number:  [id number]  Date Initiated:  10/22/2013  Documentation initiated by:  GRAVES-BIGELOW,Lillyonna Armstead  Subjective/Objective Assessment:   Pt admitted for chest pain. Plan for 2 day stress test. Pt was at a Substance abuse treatment center prior to admission.     Action/Plan:   CM will continue to monitor for disposition needs.   Anticipated DC Date:  10/23/2013   Anticipated DC Plan:  [facility]      DC Planning Services  CM consult      Choice offered to / List presented to:             Status of service:  In process, will continue to follow Medicare Important Message given?   (If response is "NO", the following Medicare IM given date fields will be blank) Date Medicare IM given:   Date Additional Medicare IM given:    Discharge Disposition:    Per UR Regulation:  Reviewed for med. necessity/level of care/duration of stay  If discussed at Long Length of Stay Meetings, dates discussed:    Comments:

## 2013-10-22 NOTE — Progress Notes (Addendum)
PROGRESS NOTE    Scott [name] WJX:[id number] DOB: [birth year] DOA: 10/21/2013 PCP: No primary provider on file.  HPI/Brief narrative 43 year old male with history of chronic back pain on chronic narcotics, alcohol abuse/binge drinking, bipolar disorder, GERD, seizures, MI 3 years ago while in [place] was in a rehabilitation facility for the past 4 days for alcohol abuse, was admitted on 10/21/13 with complaints of chest pain.   Assessment/Plan:  1. Chest pain, atypical: Cardiac enzymes negative. EKG without acute findings. Patient has an appointment to see a cardiologist on Tuesday of next week. CTA chest negative for aortic dissection, aneurysm or pulmonary embolism. Cardiology consulted and [facility]- which will be a 2 day study. Continue aspirin. 2. Leg edema: Albumin minimally low at 3.4. Follow 2-D echo results. On Lasix here. Monitor. 3. History of alcohol abuse: No features of withdrawal at this time. Check CIWA. Thiamine. 4. Chronic back pain: Controlled on current regimen in the hospital. Patient apparently had a withdrawal from not receiving his opioids at the rehabilitation facility PTA-restarted on oxycodone. No withdrawal features at this time. 5. History of seizure disorder: Continue Trileptal 6. History of bipolar disorder: Continue home medications.   Code Status: Full Family Communication: None at bedside Disposition Plan: Home when medically stable   Consultants:  Cardiology  Procedures:  None  Antibiotics:  None   Subjective: Denied chest pain this morning.  Objective: Filed Vitals:   10/22/13 1248 10/22/13 1249 10/22/13 1251 10/22/13 1252  BP: 117/72 119/68 128/74 125/65  Pulse: 60 75 76 74  Temp:      TempSrc:      Resp:      Height:      Weight:      SpO2:       No intake or output data in the 24 hours ending 10/22/13 1721 Filed Weights   10/21/13 1945 10/21/13 2239  Weight: 124.739 kg (275 lb) 130.318 kg  (287 lb 4.8 oz)   respiratory rate: 20, oxygen saturation 99% and temperature 98.8F  Exam:  General exam: Pleasant elderly male lying comfortably in bed Respiratory system: Clear. No increased work of breathing. Cardiovascular system: S1 & S2 heard, RRR. No JVD, murmurs, gallops, clicks. 1+pedal edema. Telemetry: Sinus bradycardia in the 50s-sinus rhythm Gastrointestinal system: Abdomen is nondistended, soft and nontender. Normal bowel sounds heard. Central nervous system: Alert and oriented. No focal neurological deficits. Extremities: Symmetric 5 x 5 power.   Data Reviewed: Basic Metabolic Panel:  Recent Labs Lab 10/21/13 1950 10/22/13 0500  NA 142 139  K 3.8 4.3  CL 103 103  CO2 25 23  GLUCOSE 126* 85  BUN 14 13  CREATININE 0.80 0.79  CALCIUM 9.1 8.6   Liver Function Tests:  Recent Labs Lab 10/22/13 0500  AST 17  ALT 28  ALKPHOS 67  BILITOT 0.7  PROT 6.4  ALBUMIN 3.4*   No results found for this basename: LIPASE, AMYLASE,  in the last 168 hours No results found for this basename: AMMONIA,  in the last 168 hours CBC:  Recent Labs Lab 10/21/13 1950 10/22/13 0500  WBC 9.2 7.0  NEUTROABS  --  3.4  HGB 14.1 13.9  HCT 40.6 40.1  MCV 88.8 88.7  PLT 237 230   Cardiac Enzymes:  Recent Labs Lab 10/21/13 0133 10/21/13 1950 10/22/13 0500 10/22/13 1150  TROPONINI <0.30 <0.30 <0.30 <0.30   BNP (last 3 results)  Recent Labs  10/21/13 1950  PROBNP 12.4   CBG:  No results found for this basename: GLUCAP,  in the last 168 hours  No results found for this or any previous visit (from the past 240 hour(s)).    Studies: Dg Chest Port 1 View  10/21/2013   CLINICAL DATA:  Chest pain.  Leg swelling.  EXAM: PORTABLE CHEST - 1 VIEW  COMPARISON:  09/08/2013  FINDINGS: The heart size and mediastinal contours are within normal limits. Both lungs are clear. The visualized skeletal structures are unremarkable.  IMPRESSION: Normal exam.   Electronically Signed   By:  Geanie Cooley M.D.   On: 10/21/2013 20:16   Ct Angio Chest Aortic Dissect W &/or W/o  10/22/2013   CLINICAL DATA:  Pulsating chest pain.  EXAM: CT ANGIOGRAPHY CHEST WITH CONTRAST  TECHNIQUE: Multidetector CT imaging of the chest was performed using the standard protocol during bolus administration of intravenous contrast. Multiplanar CT image reconstructions and MIPs were obtained to evaluate the vascular anatomy.  CONTRAST:  100 mL OMNIPAQUE IOHEXOL 350 MG/ML SOLN  COMPARISON:  CT chest 02/13/2010.  FINDINGS: There is no aortic dissection or aneurysm. No pleural or pericardial effusion. No pulmonary embolus is identified. Enlarged, heterogeneous left lobe of the thyroid is unchanged. There is no axillary, hilar or mediastinal lymphadenopathy. The lungs demonstrate only mild dependent atelectasis. Visualized upper abdomen shows fatty infiltration of the liver. No focal bony abnormality is identified.  Review of the MIP images confirms the above findings.  IMPRESSION: Negative for aortic dissection or aneurysm.  No acute finding.  Fatty infiltration of the liver.   Electronically Signed   By: Drusilla Kanner M.D.   On: 10/22/2013 01:26        Scheduled Meds: . aspirin EC  81 mg Oral Daily  . busPIRone  15 mg Oral TID  . citalopram  20 mg Oral Daily  . furosemide  40 mg Oral BID  . gabapentin  800 mg Oral TID  . lisinopril  2.5 mg Oral Daily  . nitroGLYCERIN  1 inch Topical 4 times per day  . OXcarbazepine  150 mg Oral BID  . pantoprazole  40 mg Oral Q1200  . sodium chloride  3 mL Intravenous Q12H  . traZODone  200 mg Oral QHS   Continuous Infusions:   Active Problems:   Chest pain   Leg swelling   Chest pain at rest   Recovering alcoholic in remission   Chronic pain syndrome   Seizure disorder   Bipolar disorder, unspecified   Withdrawal symptoms, drug or narcotic    Time spent: 30 minutes    HONGALGI,ANAND, MD, FACP, FHM. [facility] Pager [phone]  If 7PM-7AM,  please contact night-coverage [email or web address] Password [email or web address] 10/22/2013, 5:21 PM    LOS: 1 day

## 2013-10-22 NOTE — Plan of Care (Signed)
Problem: Phase II Progression Outcomes Goal: Stress Test if indicated Outcome: Progressing Completing day 1 of 2-day stress test

## 2013-10-22 NOTE — Progress Notes (Signed)
Patient at nurses station c/o mild left shoulder pain radiating to the left side of his neck and down his left arm.  States the pain started while he was ambulating in the hall and was associated with becoming "hot and sweaty."  Patient does not appear diaphoretic at this time.  Since ambulating in hall ~10 minutes ago, patient has been in his room talking on the phone.  Denies SOB or dizziness during episode of pain.  States the pain worsens with deep breaths and improves with rest and is unlike the chest pain he experienced upon admission to the hospital.  Patient advised to stay in bed and call RN if pain recurs.  EKG shows NSR with no changes from previous EKG.  Upon completing EKG and assessing patient, he states the pain has resolved.  Encouraged patient to notify RN if pain recurs.  Will continue to monitor.  Alonza Bogusuvall, Emilly Lavey Gray

## 2013-10-23 ENCOUNTER — Encounter (HOSPITAL_COMMUNITY): Payer: Medicare Other

## 2013-10-23 DIAGNOSIS — G894 Chronic pain syndrome: Secondary | ICD-10-CM

## 2013-10-23 DIAGNOSIS — G40909 Epilepsy, unspecified, not intractable, without status epilepticus: Secondary | ICD-10-CM

## 2013-10-23 LAB — BASIC METABOLIC PANEL
BUN: 16 mg/dL (ref 6–23)
CO2: 27 mEq/L (ref 19–32)
Calcium: 8.3 mg/dL — ABNORMAL LOW (ref 8.4–10.5)
Chloride: 100 mEq/L (ref 96–112)
Creatinine, Ser: 0.86 mg/dL (ref 0.50–1.35)
GFR calc Af Amer: >90 mL/min (ref >90)
GFR calc non Af Amer: >90 mL/min (ref >90)
Glucose, Bld: 94 mg/dL (ref 70–99)
Potassium: 4.2 mEq/L (ref 3.7–5.3)
Sodium: 141 mEq/L (ref 137–147)

## 2013-10-23 LAB — DRUGS OF ABUSE SCREEN W/O ALC, ROUTINE URINE
Amphetamine Screen, Ur: NEGATIVE
Barbiturate Quant, Ur: POSITIVE — AB
Benzodiazepines.: NEGATIVE
Cocaine Metabolites: NEGATIVE
Creatinine,U: 98.1 mg/dL
Marijuana Metabolite: NEGATIVE
Methadone: NEGATIVE
Opiate Screen, Urine: POSITIVE — AB
Phencyclidine (PCP): NEGATIVE
Propoxyphene: NEGATIVE

## 2013-10-23 MED ORDER — FUROSEMIDE 40 MG PO TABS: 40.0000 mg | ORAL_TABLET | Freq: Two times a day (BID) | ORAL | Status: DC

## 2013-10-23 MED ORDER — LISINOPRIL 2.5 MG PO TABS: 2.5000 mg | ORAL_TABLET | Freq: Every day | ORAL | Status: DC

## 2013-10-23 MED ORDER — TECHNETIUM TC 99M SESTAMIBI GENERIC - CARDIOLITE
30.0000 | Freq: Once | INTRAVENOUS | Status: AC | PRN
  Administered 2013-10-23: 30 via INTRAVENOUS

## 2013-10-23 NOTE — Progress Notes (Signed)
Reviewed discharge instructions with patient and he stated his understanding.  Discharged home with wife.  Colman Caterarpley, Elley Harp Danielle

## 2013-10-23 NOTE — Progress Notes (Signed)
Clinical Social Work Department BRIEF PSYCHOSOCIAL ASSESSMENT 10/23/2013  Patient:  Scott [name],Scott [name]     Account Number:  [id number]     Admit date:  10/21/2013  Clinical Social Worker:  Harless NakayamaAMBELAL,Linsey Hirota, LCSWA  Date/Time:  10/23/2013 11:00 AM  Referred by:  Physician  Date Referred:  10/23/2013 Referred for  Psychosocial assessment   Other Referral:   Interview type:  Patient Other interview type:    PSYCHOSOCIAL DATA Living Status:  WIFE Admitted from facility:   Level of care:   Primary support name:  Scott [name] [phone] Primary support relationship to patient:  SPOUSE Degree of support available:   Pt reports having a very good support system    CURRENT CONCERNS Current Concerns  Other - See comment   Other Concerns:   [facility]    SOCIAL WORK ASSESSMENT / PLAN CSW made aware that [facility] and facility is calling to obtain information so pt can dc back. CSW spoke with pt in room and was informed that [facility] but does not want to return. Pt voluntarily admitted himself for alcohol abuse. Pt reports he was having trouble not turning to alcohol when he was feeling depressed. Pt stated that he was "drinking himself to sleep to forget". Pt informed CSW that he does not feel as though [facility] was a good fit for him and was not allowing him to remain positive and receive the support necessary to recover. Pt has an extensive list of [place] programs and is reviewing them to decide where to go next. CSW asked what pt plan was in the mean time. Pt stated he has an excellent support system with family and friends and he has already spoken with his wife about returning home at dc while he looks for a new treatmet program. Pt intends to continue to utilize outpatient resouces during this time.    CSW updated pt nurse and informed that no [facility] be [facility] to [facility] as [facility]. If facility calls, they should be asked  to please speak with pt directly.   Assessment/plan status:  No Further Intervention Required Other assessment/ plan:   Information/referral to community resources:   None needed (pt already in possession of substance abuse treatment list)    PATIENT'S/FAMILY'S RESPONSE TO PLAN OF CARE: Pt is agreeable and positive about dc plan to return home       Scott [name], LCSWA [phone]

## 2013-10-23 NOTE — Discharge Instructions (Signed)
Finding Treatment for Alcohol and Drug Addiction It can be hard to find the right place to get professional treatment. Here are some important things to consider:  There are different types of treatment to choose from.  Some programs are live-in (residential) while others are not (outpatient). Sometimes a combination is offered.  No single type of program is right for everyone.  Most treatment programs involve a combination of education, counseling, and a 12-step, spiritually-based approach.  There are non-spiritually based programs (not 12-step).  Some treatment programs are government sponsored. They are geared for patients without private insurance.  Treatment programs can vary in many respects such as:  Cost and types of insurance accepted.  Types of on-site medical services offered.  Length of stay, setting, and size.  Overall philosophy of treatment. A person may need specialized treatment or have needs not addressed by all programs. For example, adolescents need treatment appropriate for their age. Other people have secondary disorders that must be managed as well. Secondary conditions can include mental illness, such as depression or diabetes. Often, a period of detoxification from alcohol or drugs is needed. This requires medical supervision and not all programs offer this. THINGS TO CONSIDER WHEN SELECTING A TREATMENT PROGRAM   Is the program certified by the appropriate government agency? Even private programs must be certified and employ certified professionals.  Does the program accept your insurance? If not, can a payment plan be set up?  Is the facility clean, organized, and well run? Do they allow you to speak with graduates who can share their treatment experience with you? Can you tour the facility? Can you meet with staff?  Does the program meet the full range of individual needs?  Does the treatment program address sexual orientation and physical disabilities?  Do they provide age, gender, and culturally appropriate treatment services?  Is treatment available in languages other than English?  Is long-term aftercare support or guidance encouraged and provided?  Is assessment of an individual's treatment plan ongoing to ensure it meets changing needs?  Does the program use strategies to encourage reluctant patients to remain in treatment long enough to increase the likelihood of success?  Does the program offer counseling (individual or group) and other behavioral therapies?  Does the program offer medicine as part of the treatment regimen, if needed?  Is there ongoing monitoring of possible relapse? Is there a defined relapse prevention program? Are services or referrals offered to family members to ensure they understand addiction and the recovery process? This would help them support the recovering individual.  Are 12-step meetings held at the center or is transport available for patients to attend outside meetings? In countries outside of the [place]. and [place], Magazine features editor for contact information for services in your area. Document Released: 06/24/2005 Document Revised: 10/18/2011 Document Reviewed: 01/04/2008 [facility] Patient Information 2014 [place], [place].  Chest Wall Pain Chest wall pain is pain felt in or around the chest bones and muscles. It may take up to 6 weeks to get better. It may take longer if you are active. Chest wall pain can happen on its own. Other times, things like germs, injury, coughing, or exercise can cause the pain. HOME CARE   Avoid activities that make you tired or cause pain. Try not to use your chest, belly (abdominal), or side muscles. Do not use heavy weights.  Put ice on the sore area.  Put ice in a plastic bag.  Place a towel between your skin and the  bag.  Leave the ice on for 15-20 minutes for the first 2 days.  Only take medicine as told by your doctor. GET HELP RIGHT AWAY IF:   You have  more pain or are very uncomfortable.  You have a fever.  Your chest pain gets worse.  You have new problems.  You feel sick to your stomach (nauseous) or throw up (vomit).  You start to sweat or feel lightheaded.  You have a cough with mucus (phlegm).  You cough up blood. MAKE SURE YOU:   Understand these instructions.  Will watch your condition.  Will get help right away if you are not doing well or get worse. Document Released: 01/12/2008 Document Revised: 10/18/2011 Document Reviewed: 03/22/2011 [facility] Patient Information 2014 [place]  Chronic Back Pain  When back pain lasts longer than 3 months, it is called chronic back pain.People with chronic back pain often go through certain periods that are more intense (flare-ups).  CAUSES Chronic back pain can be caused by wear and tear (degeneration) on different structures in your back. These structures include:  The bones of your spine (vertebrae) and the joints surrounding your spinal cord and nerve roots (facets).  The strong, fibrous tissues that connect your vertebrae (ligaments). Degeneration of these structures may result in pressure on your nerves. This can lead to constant pain. HOME CARE INSTRUCTIONS  Avoid bending, heavy lifting, prolonged sitting, and activities which make the problem worse.  Take brief periods of rest throughout the day to reduce your pain. Lying down or standing usually is better than sitting while you are resting.  Take over-the-counter or prescription medicines only as directed by your caregiver. SEEK IMMEDIATE MEDICAL CARE IF:   You have weakness or numbness in one of your legs or feet.  You have trouble controlling your bladder or bowels.  You have nausea, vomiting, abdominal pain, shortness of breath, or fainting. Document Released: 09/02/2004 Document Revised: 10/18/2011 Document Reviewed: 07/10/2011 [facility] Patient Information 2014 [place]

## 2013-10-23 NOTE — Discharge Summary (Signed)
Physician Discharge Summary  Scott [name] UJW:[id number] DOB: 26-Jan-1971 DOA: 10/21/2013  PCP: No primary provider on file.  Admit date: 10/21/2013 Discharge date: 10/23/2013  Time spent: 35 minutes  Recommendations for Outpatient Follow-up:  1. Follow up with PCP in 1 week. titrate Antihypertensive as tolerate it. 2. Follow up on lower extremity edema  Discharge Diagnoses:  Active Problems:   Chest pain   Leg swelling   Chest pain at rest   Recovering alcoholic in remission   Chronic pain syndrome   Seizure disorder   Bipolar disorder, unspecified   Withdrawal symptoms, drug or narcotic   Discharge Condition: stable  Diet recommendation: heart healthy  Filed Weights   10/21/13 1945 10/21/13 2239 10/23/13 0735  Weight: 124.739 kg (275 lb) 130.318 kg (287 lb 4.8 oz) 126.554 kg (279 lb)    History of present illness:  43 y/o patient states he had a slight heart attack 3 years ago while at [place] for the past 2 years he have had episodes of shortness of breath and bilateral leg swelling which is episodic. Patient has intentionally lost 60 lbs and when his weight comes back the swelling increases. He feels more short of breath when he is laying down flat. He has occasional symptoms of getting up in the middle of the night short of breath. He has occasional sensation of abdominal swelling and distention.  He has hx of chronic back pain and has hard time exercising.  Patient reports having episodes of binge drinking try to put himself to sleep. He felt that this was a problem and got himself admitted to inpatient rehabilitation facility where he has been for past 4 days. Of note patient has history of chronic back pain and is on chronic narcotics which equivalent of 30 oxycodone twice daily. While at that facility he was not allowed to take his pain medications. While at the facility she started to notice diarrhea generalized malaise aches, cough, hot and cold flashes, palpitations.   This AM patient noted generalized fatigue and his legs throbbing bilaterally with pitting edema. He noted that his chest was hurting at that time as well. By noon his pain got to be really bad. Describes sharp aching throbbing/pulsating chest pain not worse with breathing but worse with sitting up. Patient was brought to [facility]. His symptoms have improved after receiving nitroglycerin and morphine.   Hospital Course:  Chest pain, atypical:  - Cardiac enzymes negative. EKG without acute findings. P - CTA chest negative for aortic dissection, aneurysm or pulmonary embolism.  - Cardiology consulted and have planned for Lexiscan Myoview study- which was negative.  Leg edema:  - Albumin minimally low at 3.4. 2-D echo EF 50% - treated with Lasix and develop contraction alkalosis.   History of alcohol abuse:  - No features of withdrawal at this time.   Chronic back pain: - Controlled on current regimen in the hospital. Patient apparently had a withdrawal from not receiving his opioids at the rehabilitation facility PTA-restarted on oxycodone.  - No withdrawal features at this time.   History of seizure disorder:  - Continue Trileptal.   History of bipolar disorder:  - Continue home medications   Procedures:  myoview negative  Consultations:  cardiology  Discharge Exam: Filed Vitals:   10/23/13 0626  BP: 124/80  Pulse: 63  Temp: 98.2 F (36.8 C)  Resp: 18    General: A&O x3 Cardiovascular: RRR Respiratory: good air movement CTA B/L  Discharge Instructions  Discharge Orders   Future Appointments Provider Department Dept Phone   10/23/2013 1:40 PM [facility] [phone]   Future Orders Complete By Expires   Diet - low sodium heart healthy  As directed    Increase activity slowly  As directed        Medication List         acetaminophen 325 MG tablet  Commonly known as:  TYLENOL  Take  650 mg by mouth every 4 (four) hours as needed for mild pain or headache.     busPIRone 15 MG tablet  Commonly known as:  BUSPAR  Take 15 mg by mouth 3 (three) times daily.     citalopram 20 MG tablet  Commonly known as:  CELEXA  Take 20 mg by mouth daily.     CLEAR EYES FOR DRY EYES PLUS 0.8-0.25-0.012 % Soln  Generic drug:  Hypromell-Glycerin-Naphazoline  Apply 1 drop to eye 4 (four) times daily as needed.     gabapentin 800 MG tablet  Commonly known as:  NEURONTIN  Take 800 mg by mouth 3 (three) times daily.     HYDROcodone-acetaminophen 5-325 MG per tablet  Commonly known as:  NORCO/VICODIN  Take 1 tablet by mouth every 6 (six) hours as needed for moderate pain.     lisinopril 2.5 MG tablet  Commonly known as:  PRINIVIL,ZESTRIL  Take 1 tablet (2.5 mg total) by mouth daily.     methocarbamol 750 MG tablet  Commonly known as:  ROBAXIN  Take 750 mg by mouth 3 (three) times daily.     OXcarbazepine 150 MG tablet  Commonly known as:  TRILEPTAL  Take 150 mg by mouth 2 (two) times daily.     oxycodone 30 MG immediate release tablet  Commonly known as:  ROXICODONE  Take 30 mg by mouth 3 (three) times daily.     traZODone 100 MG tablet  Commonly known as:  DESYREL  Take 100 mg by mouth at bedtime as needed for sleep.       Allergies  Allergen Reactions  . Benadryl [Diphenhydramine Hcl] Anaphylaxis and Other (See Comments)    Muscles lock up.  . Prednisone Other (See Comments)    Severe Anger  . Nsaids Rash    Nose bleeds      The results of significant diagnostics from this hospitalization (including imaging, microbiology, ancillary and laboratory) are listed below for reference.    Significant Diagnostic Studies: Nm Myocar Multi W/spect W/wall Motion / Ef  10/23/2013   CLINICAL DATA:  43 year old with history of chest pain.  EXAM: MYOCARDIAL IMAGING WITH SPECT (REST AND PHARMACOLOGIC-STRESS - 2 DAY PROTOCOL)  GATED LEFT VENTRICULAR WALL MOTION STUDY  LEFT  VENTRICULAR EJECTION FRACTION  TECHNIQUE: Standard myocardial SPECT imaging was performed after resting intravenous injection of 30 mCi Tc-6378m sestamibi. Subsequently, on a second day, intravenous infusion of Lexiscan was performed under the supervision of the Cardiology staff. At peak effect of the drug, 30 mCi Tc-3878m sestamibi was injected intravenously and standard myocardial SPECT imaging was performed. Quantitative gated imaging was also performed to evaluate left ventricular wall motion, and estimate left ventricular ejection fraction.  COMPARISON:  Chest CT 10/22/2013  FINDINGS: The myocardial perfusion is normal on the stress images. There is no evidence for a fixed defect or reversibility.  There is normal wall motion. The end-diastolic volume is 123 ml and end systolic volume is 47 ml. The calculated ejection fraction is 62%.  IMPRESSION:  Normal examination. No evidence for pharmacological induced ischemia.  Normal wall motion with calculated ejection fraction of 62%.   Electronically Signed   By: Richarda Overlie M.D.   On: 10/23/2013 09:14   Dg Chest Port 1 View  10/21/2013   CLINICAL DATA:  Chest pain.  Leg swelling.  EXAM: PORTABLE CHEST - 1 VIEW  COMPARISON:  09/08/2013  FINDINGS: The heart size and mediastinal contours are within normal limits. Both lungs are clear. The visualized skeletal structures are unremarkable.  IMPRESSION: Normal exam.   Electronically Signed   By: Geanie Cooley M.D.   On: 10/21/2013 20:16   Ct Angio Chest Aortic Dissect W &/or W/o  10/22/2013   CLINICAL DATA:  Pulsating chest pain.  EXAM: CT ANGIOGRAPHY CHEST WITH CONTRAST  TECHNIQUE: Multidetector CT imaging of the chest was performed using the standard protocol during bolus administration of intravenous contrast. Multiplanar CT image reconstructions and MIPs were obtained to evaluate the vascular anatomy.  CONTRAST:  100 mL OMNIPAQUE IOHEXOL 350 MG/ML SOLN  COMPARISON:  CT chest 02/13/2010.  FINDINGS: There is no aortic  dissection or aneurysm. No pleural or pericardial effusion. No pulmonary embolus is identified. Enlarged, heterogeneous left lobe of the thyroid is unchanged. There is no axillary, hilar or mediastinal lymphadenopathy. The lungs demonstrate only mild dependent atelectasis. Visualized upper abdomen shows fatty infiltration of the liver. No focal bony abnormality is identified.  Review of the MIP images confirms the above findings.  IMPRESSION: Negative for aortic dissection or aneurysm.  No acute finding.  Fatty infiltration of the liver.   Electronically Signed   By: Drusilla Kanner M.D.   On: 10/22/2013 01:26    Microbiology: No results found for this or any previous visit (from the past 240 hour(s)).   Labs: Basic Metabolic Panel:  Recent Labs Lab 10/21/13 1950 10/22/13 0500 10/23/13 0440  NA 142 139 141  K 3.8 4.3 4.2  CL 103 103 100  CO2 25 23 27   GLUCOSE 126* 85 94  BUN 14 13 16   CREATININE 0.80 0.79 0.86  CALCIUM 9.1 8.6 8.3*   Liver Function Tests:  Recent Labs Lab 10/22/13 0500  AST 17  ALT 28  ALKPHOS 67  BILITOT 0.7  PROT 6.4  ALBUMIN 3.4*   No results found for this basename: LIPASE, AMYLASE,  in the last 168 hours No results found for this basename: AMMONIA,  in the last 168 hours CBC:  Recent Labs Lab 10/21/13 1950 10/22/13 0500  WBC 9.2 7.0  NEUTROABS  --  3.4  HGB 14.1 13.9  HCT 40.6 40.1  MCV 88.8 88.7  PLT 237 230   Cardiac Enzymes:  Recent Labs Lab 10/21/13 0133 10/21/13 1950 10/22/13 0500 10/22/13 1150  TROPONINI <0.30 <0.30 <0.30 <0.30   BNP: BNP (last 3 results)  Recent Labs  10/21/13 1950  PROBNP 12.4   CBG: No results found for this basename: GLUCAP,  in the last 168 hours     Signed:  Marinda Elk  [facility] 10/23/2013, 1:27 PM

## 2013-10-23 NOTE — Progress Notes (Signed)
Patient Name: Scott [name] Date of Encounter: 10/23/2013     Active Problems:   Chest pain   Leg swelling   Chest pain at rest   Recovering alcoholic in remission   Chronic pain syndrome   Seizure disorder   Bipolar disorder, unspecified   Withdrawal symptoms, drug or narcotic    SUBJECTIVE No further chest pain.  Rhythm regular  CURRENT MEDS . aspirin EC  81 mg Oral Daily  . busPIRone  15 mg Oral TID  . citalopram  20 mg Oral Daily  . furosemide  40 mg Oral BID  . gabapentin  800 mg Oral TID  . lisinopril  2.5 mg Oral Daily  . OXcarbazepine  150 mg Oral BID  . pantoprazole  40 mg Oral Q1200  . sodium chloride  3 mL Intravenous Q12H  . thiamine  100 mg Oral Daily  . traZODone  200 mg Oral QHS    OBJECTIVE  Filed Vitals:   10/22/13 1252 10/22/13 2130 10/23/13 0626 10/23/13 0735  BP: 125/65 166/73 124/80   Pulse: 74 80 63   Temp:  97.7 F (36.5 C) 98.2 F (36.8 C)   TempSrc:  Oral Oral   Resp:  18 18   Height:      Weight:    279 lb (126.554 kg)  SpO2:  100% 93%     Intake/Output Summary (Last 24 hours) at 10/23/13 0954 Last data filed at 10/23/13 0700  Gross per 24 hour  Intake      0 ml  Output   1700 ml  Net  -1700 ml   Filed Weights   10/21/13 1945 10/21/13 2239 10/23/13 0735  Weight: 275 lb (124.739 kg) 287 lb 4.8 oz (130.318 kg) 279 lb (126.554 kg)    PHYSICAL EXAM  General: Pleasant, NAD. Neuro: Alert and oriented X 3. Moves all extremities spontaneously. Psych: Normal affect. HEENT:  Normal  Neck: Supple without bruits or JVD. Lungs:  Resp regular and unlabored, CTA. Heart: RRR no s3, s4, or murmurs. Abdomen: Soft, non-tender, non-distended, BS + x 4.  Extremities: No clubbing, cyanosis or edema. DP/PT/Radials 2+ and equal bilaterally.  Accessory Clinical Findings  CBC  Recent Labs  10/21/13 1950 10/22/13 0500  WBC 9.2 7.0  NEUTROABS  --  3.4  HGB 14.1 13.9  HCT 40.6 40.1  MCV 88.8 88.7  PLT 237 230   Basic Metabolic  Panel  Recent Labs  96/04/54 0500 10/23/13 0440  NA 139 141  K 4.3 4.2  CL 103 100  CO2 23 27  GLUCOSE 85 94  BUN 13 16  CREATININE 0.79 0.86  CALCIUM 8.6 8.3*   Liver Function Tests  Recent Labs  10/22/13 0500  AST 17  ALT 28  ALKPHOS 67  BILITOT 0.7  PROT 6.4  ALBUMIN 3.4*   No results found for this basename: LIPASE, AMYLASE,  in the last 72 hours Cardiac Enzymes  Recent Labs  10/21/13 1950 10/22/13 0500 10/22/13 1150  TROPONINI <0.30 <0.30 <0.30   BNP No components found with this basename: POCBNP,  D-Dimer No results found for this basename: DDIMER,  in the last 72 hours Hemoglobin A1C No results found for this basename: HGBA1C,  in the last 72 hours Fasting Lipid Panel No results found for this basename: CHOL, HDL, LDLCALC, TRIG, CHOLHDL, LDLDIRECT,  in the last 72 hours Thyroid Function Tests  Recent Labs  10/22/13 0500  TSH 1.193    TELE  Normal sinus rhythm  ECG  Radiology/Studies  Nm Myocar Multi W/spect W/wall Motion / Ef  10/23/2013   CLINICAL DATA:  43 year old with history of chest pain.  EXAM: MYOCARDIAL IMAGING WITH SPECT (REST AND PHARMACOLOGIC-STRESS - 2 DAY PROTOCOL)  GATED LEFT VENTRICULAR WALL MOTION STUDY  LEFT VENTRICULAR EJECTION FRACTION  TECHNIQUE: Standard myocardial SPECT imaging was performed after resting intravenous injection of 30 mCi Tc-6220m sestamibi. Subsequently, on a second day, intravenous infusion of Lexiscan was performed under the supervision of the Cardiology staff. At peak effect of the drug, 30 mCi Tc-5420m sestamibi was injected intravenously and standard myocardial SPECT imaging was performed. Quantitative gated imaging was also performed to evaluate left ventricular wall motion, and estimate left ventricular ejection fraction.  COMPARISON:  Chest CT 10/22/2013  FINDINGS: The myocardial perfusion is normal on the stress images. There is no evidence for a fixed defect or reversibility.  There is normal wall  motion. The end-diastolic volume is 123 ml and end systolic volume is 47 ml. The calculated ejection fraction is 62%.  IMPRESSION: Normal examination. No evidence for pharmacological induced ischemia.  Normal wall motion with calculated ejection fraction of 62%.   Electronically Signed   By: Richarda OverlieAdam  Henn M.D.   On: 10/23/2013 09:14   Dg Chest Port 1 View  10/21/2013   CLINICAL DATA:  Chest pain.  Leg swelling.  EXAM: PORTABLE CHEST - 1 VIEW  COMPARISON:  09/08/2013  FINDINGS: The heart size and mediastinal contours are within normal limits. Both lungs are clear. The visualized skeletal structures are unremarkable.  IMPRESSION: Normal exam.   Electronically Signed   By: Geanie CooleyJim  Maxwell M.D.   On: 10/21/2013 20:16   Ct Angio Chest Aortic Dissect W &/or W/o  10/22/2013   CLINICAL DATA:  Pulsating chest pain.  EXAM: CT ANGIOGRAPHY CHEST WITH CONTRAST  TECHNIQUE: Multidetector CT imaging of the chest was performed using the standard protocol during bolus administration of intravenous contrast. Multiplanar CT image reconstructions and MIPs were obtained to evaluate the vascular anatomy.  CONTRAST:  100 mL OMNIPAQUE IOHEXOL 350 MG/ML SOLN  COMPARISON:  CT chest 02/13/2010.  FINDINGS: There is no aortic dissection or aneurysm. No pleural or pericardial effusion. No pulmonary embolus is identified. Enlarged, heterogeneous left lobe of the thyroid is unchanged. There is no axillary, hilar or mediastinal lymphadenopathy. The lungs demonstrate only mild dependent atelectasis. Visualized upper abdomen shows fatty infiltration of the liver. No focal bony abnormality is identified.  Review of the MIP images confirms the above findings.  IMPRESSION: Negative for aortic dissection or aneurysm.  No acute finding.  Fatty infiltration of the liver.   Electronically Signed   By: Drusilla Kannerhomas  Dalessio M.D.   On: 10/22/2013 01:26    ASSESSMENT AND PLAN  Nuclear stress test is normal.  Ejection fraction is normal. Okay for discharge  today from cardiac standpoint.  Signed, Cassell Clementhomas Toddrick Sanna MD

## 2013-10-24 LAB — BARBITURATE, URINE, CONFIRMATION
Amobarbital UR Quant: NEGATIVE ng/mL
Butalbital UR Quant: NEGATIVE ng/mL
Pentobarbital GC/MS Conf: NEGATIVE ng/mL
Phenobarbital GC/MS Conf: 1339 ng/mL
Secobarbital GC/MS Conf: NEGATIVE ng/mL

## 2013-10-25 LAB — OPIATE, QUANTITATIVE, URINE
6 Monoacetylmorphine, Ur-Confirm: NEGATIVE ng/mL
Codeine Urine: NEGATIVE ng/mL
Hydrocodone: NEGATIVE ng/mL
Hydromorphone GC/MS Conf: NEGATIVE ng/mL
Morphine, Confirm: 790 ng/mL
Norhydrocodone, Ur: NEGATIVE ng/mL
Noroxycodone, Ur: 3317 ng/mL
Oxycodone, ur: 6917 ng/mL
Oxymorphone: 1666 ng/mL

## 2013-11-13 ENCOUNTER — Encounter (HOSPITAL_COMMUNITY): Payer: Self-pay | Admitting: Emergency Medicine

## 2013-11-13 ENCOUNTER — Emergency Department (HOSPITAL_COMMUNITY)
Admission: EM | Admit: 2013-11-13 | Discharge: 2013-11-13 | Discharge status: Against Medical Advice | Payer: Medicare Other | Attending: Emergency Medicine | Admitting: Emergency Medicine

## 2013-11-13 DIAGNOSIS — M549 Dorsalgia, unspecified: Secondary | ICD-10-CM | POA: Insufficient documentation

## 2013-11-13 DIAGNOSIS — Z87891 Personal history of nicotine dependence: Secondary | ICD-10-CM | POA: Insufficient documentation

## 2013-11-13 NOTE — ED Notes (Signed)
Per pt sts he has been having back pain. sts he has herniated disc. sts he has been incontinent of urine and his legs want give out on him. sts numbness in private area.

## 2013-11-13 NOTE — ED Notes (Signed)
Pt up to nurses station, states he wants to leave the pain is too bad, pt encouraged to stay, pt [facility]

## 2013-11-15 ENCOUNTER — Emergency Department (HOSPITAL_COMMUNITY)
Admission: EM | Admit: 2013-11-15 | Discharge: 2013-11-15 | Discharge status: Against Medical Advice | Payer: Medicare Other | Source: Home / Self Care

## 2013-11-15 ENCOUNTER — Encounter (HOSPITAL_COMMUNITY): Payer: Self-pay | Admitting: Emergency Medicine

## 2013-11-15 ENCOUNTER — Emergency Department (HOSPITAL_COMMUNITY): Payer: Medicare Other

## 2013-11-15 ENCOUNTER — Encounter (HOSPITAL_BASED_OUTPATIENT_CLINIC_OR_DEPARTMENT_OTHER): Payer: Self-pay | Admitting: Emergency Medicine

## 2013-11-15 ENCOUNTER — Emergency Department (HOSPITAL_BASED_OUTPATIENT_CLINIC_OR_DEPARTMENT_OTHER)
Admission: EM | Admit: 2013-11-15 | Discharge: 2013-11-15 | Disposition: A | Discharge status: Home / Self Care | Payer: Medicare Other | Attending: Emergency Medicine | Admitting: Emergency Medicine

## 2013-11-15 DIAGNOSIS — G40909 Epilepsy, unspecified, not intractable, without status epilepticus: Secondary | ICD-10-CM | POA: Insufficient documentation

## 2013-11-15 DIAGNOSIS — I252 Old myocardial infarction: Secondary | ICD-10-CM | POA: Insufficient documentation

## 2013-11-15 DIAGNOSIS — Z79899 Other long term (current) drug therapy: Secondary | ICD-10-CM | POA: Insufficient documentation

## 2013-11-15 DIAGNOSIS — G8929 Other chronic pain: Secondary | ICD-10-CM | POA: Insufficient documentation

## 2013-11-15 DIAGNOSIS — M549 Dorsalgia, unspecified: Secondary | ICD-10-CM

## 2013-11-15 DIAGNOSIS — M545 Low back pain, unspecified: Secondary | ICD-10-CM | POA: Insufficient documentation

## 2013-11-15 DIAGNOSIS — F411 Generalized anxiety disorder: Secondary | ICD-10-CM | POA: Insufficient documentation

## 2013-11-15 DIAGNOSIS — F3130 Bipolar disorder, current episode depressed, mild or moderate severity, unspecified: Secondary | ICD-10-CM | POA: Insufficient documentation

## 2013-11-15 DIAGNOSIS — Z87891 Personal history of nicotine dependence: Secondary | ICD-10-CM | POA: Insufficient documentation

## 2013-11-15 DIAGNOSIS — R209 Unspecified disturbances of skin sensation: Secondary | ICD-10-CM | POA: Insufficient documentation

## 2013-11-15 MED ORDER — HYDROMORPHONE HCL PF 1 MG/ML IJ SOLN
1.0000 mg | Freq: Once | INTRAMUSCULAR | Status: AC
  Administered 2013-11-15: 1 mg via INTRAVENOUS
  Filled 2013-11-15: qty 1

## 2013-11-15 MED ORDER — METHYLPREDNISOLONE 4 MG PO KIT: PACK | ORAL | Status: DC

## 2013-11-15 MED ORDER — ONDANSETRON HCL 4 MG/2ML IJ SOLN
4.0000 mg | Freq: Once | INTRAMUSCULAR | Status: AC
  Administered 2013-11-15: 4 mg via INTRAVENOUS
  Filled 2013-11-15: qty 2

## 2013-11-15 MED ORDER — HYDROMORPHONE HCL PF 2 MG/ML IJ SOLN
2.0000 mg | Freq: Once | INTRAMUSCULAR | Status: AC
  Administered 2013-11-15: 2 mg via INTRAVENOUS
  Filled 2013-11-15: qty 1

## 2013-11-15 MED ORDER — HYDROMORPHONE HCL PF 2 MG/ML IJ SOLN: 2.0000 mg | Freq: Once | INTRAMUSCULAR | Status: DC

## 2013-11-15 MED ORDER — OXYCODONE HCL 5 MG PO TABS: 5.0000 mg | ORAL_TABLET | ORAL | Status: DC | PRN

## 2013-11-15 NOTE — ED Provider Notes (Signed)
CSN: [id number]     Arrival date & time 11/15/13  0210 History   First MD Initiated Contact with Patient 11/15/13 0315     Chief Complaint  Patient presents with  . Back Pain     (Consider location/radiation/quality/duration/timing/severity/associated sxs/prior Treatment) HPI This is a 43 year old male with a history of chronic back pain. He has been on oxycodone 5 mg twice daily. He is experienced an exacerbation over the past 3 days. He was seen at another ED 2 days ago and was given IM Dilaudid and Phenergan. This provided transient relief but his primary care physician has been reticent to increase his scheduled narcotics. He is here with severe low back pain radiating down the right leg. Over the past 24 hours he has developed new numbness down the lateral aspect of the right leg and in his genitalia. He was unable to get an erection which has never been a problem for him in the past. He has also had one episode of urinary incontinence yesterday evening while sitting. He is still able to move his lower extremities.  Past Medical History  Diagnosis Date  . Back pain   . Seizures   . History of ETOH abuse   . Bipolar 1 disorder   . Myocardial infarction   . Anxiety   . Depression   . GERD (gastroesophageal reflux disease)    Past Surgical History  Procedure Laterality Date  . Back surgery    . Appendectomy    . Abdominal surgery     Family History  Problem Relation Age of Onset  . Hypertension Mother   . CAD Father   . COPD Father   . Stroke Father   . Testicular cancer Brother    History  Substance Use Topics  . Smoking status: Former Smoker -- 0.50 packs/day    Types: Cigarettes  . Smokeless tobacco: Never Used  . Alcohol Use: Yes     Comment: last drink 1.5 months ago    Review of Systems  All other systems reviewed and are negative.  Allergies  Benadryl; Celecoxib; Prednisone; Rofecoxib; and Nsaids  Home Medications   Current Outpatient Rx  Name  Route   Sig  Dispense  Refill  . busPIRone (BUSPAR) 15 MG tablet   Oral   Take 15 mg by mouth 3 (three) times daily.         . citalopram (CELEXA) 20 MG tablet   Oral   Take 20 mg by mouth daily.         Marland Kitchen gabapentin (NEURONTIN) 800 MG tablet   Oral   Take 800 mg by mouth 3 (three) times daily.         . OXcarbazepine (TRILEPTAL) 150 MG tablet   Oral   Take 150 mg by mouth 2 (two) times daily.         Marland Kitchen oxyCODONE (ROXICODONE) 5 MG immediate release tablet   Oral   Take 5 mg by mouth every 4 (four) hours as needed for moderate pain or severe pain.         . traZODone (DESYREL) 100 MG tablet   Oral   Take 100 mg by mouth at bedtime as needed for sleep.           BP 145/88  Pulse 67  Temp(Src) 98.2 F (36.8 C) (Oral)  Resp 18  Ht 6' (1.829 m)  Wt 280 lb (127.007 kg)  BMI 37.97 kg/m2  SpO2 96%  Physical Exam General: Well-developed, well-nourished  male in no acute distress; appearance consistent with age of record HENT: normocephalic; atraumatic Eyes: pupils equal, round and reactive to light; extraocular muscles intact Neck: supple Heart: regular rate and rhythm Lungs: clear to auscultation bilaterally Abdomen: soft; nondistended Back: Pain on movement of lower back; pain on straight leg raise bilaterally at about 15 Extremities: No deformity; decreased range of motion in hips due to pain; pulses normal Neurologic: Awake, alert and oriented; motor function intact in all extremities and symmetric lower extremity exam limited due to pain in lower back; decreased sensation lateral right lower extremity and genitalia; no facial droop Skin: Warm and dry Psychiatric: Normal mood and affect    ED Course  Procedures (including critical care time)   MDM  [facility] MRI to evaluate for cauda equina syndrome.    Hanley SeamenJohn L Tina Gruner, MD 11/15/13 [phone]

## 2013-11-15 NOTE — ED Notes (Signed)
Pt informed of wait time and pt stated that [facility] and walked out.

## 2013-11-15 NOTE — ED Notes (Signed)
Dr. Anitra LauthPlunkett to bedside with ultrasound. Reports urine in patient's bladder. States he hasn't urinated since last night at 6 pm. Pt standing attempting to use urinal.

## 2013-11-15 NOTE — ED Notes (Signed)
Pt c/o back pain, this is a chronic issue. Has been seen multiple times at multiple locations for same.

## 2013-11-15 NOTE — ED Notes (Signed)
Pt ambulatory to discharge independently. Pt's wife waiting on him in the lobby.
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
## 2013-11-15 NOTE — ED Notes (Signed)
Pt to tx to [facility] for further evaluation, emtala completed, charge RN aware of patients pending arrival

## 2013-11-15 NOTE — ED Provider Notes (Signed)
Patient transferred from [facility] [place] or MRI to rule out cauda equina do to 3 day exacerbation of worsening back pain with several episodes of urinary incontinence due to having trouble sensing that he needs to go and having erections that he was unaware of intermittently for the last 3 days.  MRI pending.  8:32 AM On outside ultrasound patient has a large distended bladder but states he does not feel like he needs to urinate and last had incontinence about 6:30 PM yesterday. Patient attempt to urinate and recheck ultrasound to see residual.  He has decreased sensation over his penis and scrotum as well as decreased sensation on the right lateral thigh. A normal rectal exam and tone and perianal sensation.   MRI showing L5/S1 2 millimeter retrolisthesis and broad-based central protrusion  With possible left greater than right nerve root impingement in the canal right greater than left foraminal narrowing which could be affecting exiting nerve roots  9:19 AM After urinating 300mL recheck and bladder is decompressed.  Will discuss with [facility].  10:11 AM Discused with Dr. Gerlene FeeKritzer and no surgical issues at this time and no cauda equina.  Will start pt on steroids and he take robaxin and increased pain meds.  Scott SproutWhitney Zahra Peffley, MD 11/15/13 1011

## 2013-11-15 NOTE — ED Notes (Signed)
Pt's wife is coming to get him. 

## 2013-11-15 NOTE — ED Notes (Signed)
Pt requesting more pain medication. ED MD reporting not at this time.

## 2013-11-15 NOTE — ED Notes (Signed)
Pt states he was just at [facility] where he was told it would be a 6 hour wait and that he should go to [facility] to be seen quicker. Pt walks with a steady gait, NAD noted.

## 2014-01-08 ENCOUNTER — Emergency Department (HOSPITAL_BASED_OUTPATIENT_CLINIC_OR_DEPARTMENT_OTHER)
Admission: EM | Admit: 2014-01-08 | Discharge: 2014-01-08 | Disposition: A | Discharge status: Home / Self Care | Payer: Medicare Other | Attending: Emergency Medicine | Admitting: Emergency Medicine

## 2014-01-08 ENCOUNTER — Emergency Department (HOSPITAL_BASED_OUTPATIENT_CLINIC_OR_DEPARTMENT_OTHER): Payer: Medicare Other

## 2014-01-08 ENCOUNTER — Encounter (HOSPITAL_BASED_OUTPATIENT_CLINIC_OR_DEPARTMENT_OTHER): Payer: Self-pay | Admitting: Emergency Medicine

## 2014-01-08 DIAGNOSIS — I252 Old myocardial infarction: Secondary | ICD-10-CM | POA: Insufficient documentation

## 2014-01-08 DIAGNOSIS — K047 Periapical abscess without sinus: Secondary | ICD-10-CM

## 2014-01-08 DIAGNOSIS — Z87891 Personal history of nicotine dependence: Secondary | ICD-10-CM | POA: Insufficient documentation

## 2014-01-08 DIAGNOSIS — Z8719 Personal history of other diseases of the digestive system: Secondary | ICD-10-CM | POA: Insufficient documentation

## 2014-01-08 DIAGNOSIS — Z79899 Other long term (current) drug therapy: Secondary | ICD-10-CM | POA: Insufficient documentation

## 2014-01-08 DIAGNOSIS — IMO0002 Reserved for concepts with insufficient information to code with codable children: Secondary | ICD-10-CM | POA: Insufficient documentation

## 2014-01-08 DIAGNOSIS — F411 Generalized anxiety disorder: Secondary | ICD-10-CM | POA: Insufficient documentation

## 2014-01-08 DIAGNOSIS — F319 Bipolar disorder, unspecified: Secondary | ICD-10-CM | POA: Insufficient documentation

## 2014-01-08 LAB — CBC WITH DIFFERENTIAL/PLATELET
Basophils Absolute: 0 10*3/uL (ref 0.0–0.1)
Basophils Relative: 0 % (ref 0–1)
Eosinophils Absolute: 0.1 10*3/uL (ref 0.0–0.7)
Eosinophils Relative: 2 % (ref 0–5)
HCT: 44.1 % (ref 39.0–52.0)
Hemoglobin: 15.6 g/dL (ref 13.0–17.0)
Lymphocytes Relative: 32 % (ref 12–46)
Lymphs Abs: 2.2 10*3/uL (ref 0.7–4.0)
MCH: 31.1 pg (ref 26.0–34.0)
MCHC: 35.4 g/dL (ref 30.0–36.0)
MCV: 87.8 fL (ref 78.0–100.0)
Monocytes Absolute: 0.8 10*3/uL (ref 0.1–1.0)
Monocytes Relative: 12 % (ref 3–12)
Neutro Abs: 3.8 10*3/uL (ref 1.7–7.7)
Neutrophils Relative %: 54 % (ref 43–77)
Platelets: 174 10*3/uL (ref 150–400)
RBC: 5.02 MIL/uL (ref 4.22–5.81)
RDW: 13.7 % (ref 11.5–15.5)
WBC: 7 10*3/uL (ref 4.0–10.5)

## 2014-01-08 LAB — COMPREHENSIVE METABOLIC PANEL
ALT: 26 U/L (ref 0–53)
AST: 18 U/L (ref 0–37)
Albumin: 4.2 g/dL (ref 3.5–5.2)
Alkaline Phosphatase: 78 U/L (ref 39–117)
BUN: 15 mg/dL (ref 6–23)
CO2: 27 mEq/L (ref 19–32)
Calcium: 9.7 mg/dL (ref 8.4–10.5)
Chloride: 102 mEq/L (ref 96–112)
Creatinine, Ser: 0.7 mg/dL (ref 0.50–1.35)
GFR calc Af Amer: >90 mL/min (ref >90)
GFR calc non Af Amer: >90 mL/min (ref >90)
Glucose, Bld: 113 mg/dL — ABNORMAL HIGH (ref 70–99)
Potassium: 4 mEq/L (ref 3.7–5.3)
Sodium: 142 mEq/L (ref 137–147)
Total Bilirubin: 0.6 mg/dL (ref 0.3–1.2)
Total Protein: 7.3 g/dL (ref 6.0–8.3)

## 2014-01-08 MED ORDER — IOHEXOL 300 MG/ML  SOLN
75.0000 mL | Freq: Once | INTRAMUSCULAR | Status: AC | PRN
  Administered 2014-01-08: 75 mL via INTRAVENOUS

## 2014-01-08 MED ORDER — HYDROMORPHONE HCL PF 1 MG/ML IJ SOLN
1.0000 mg | Freq: Once | INTRAMUSCULAR | Status: AC
  Administered 2014-01-08: 1 mg via INTRAVENOUS
  Filled 2014-01-08: qty 1

## 2014-01-08 MED ORDER — CLINDAMYCIN PHOSPHATE 600 MG/50ML IV SOLN
600.0000 mg | Freq: Once | INTRAVENOUS | Status: AC
  Administered 2014-01-08: 600 mg via INTRAVENOUS
  Filled 2014-01-08: qty 50

## 2014-01-08 MED ORDER — SODIUM CHLORIDE 0.9 % IV SOLN
Freq: Once | INTRAVENOUS | Status: AC
  Administered 2014-01-08: 13:00:00 via INTRAVENOUS

## 2014-01-08 MED ORDER — ONDANSETRON HCL 4 MG/2ML IJ SOLN
4.0000 mg | Freq: Once | INTRAMUSCULAR | Status: AC
  Administered 2014-01-08: 4 mg via INTRAMUSCULAR
  Filled 2014-01-08: qty 2

## 2014-01-08 MED ORDER — CLINDAMYCIN HCL 300 MG PO CAPS: ORAL_CAPSULE | ORAL | Status: DC

## 2014-01-08 MED ORDER — HYDROCODONE-ACETAMINOPHEN 5-325 MG PO TABS: 2.0000 | ORAL_TABLET | ORAL | Status: DC | PRN

## 2014-01-08 NOTE — ED Provider Notes (Signed)
CSN: [id number]     Arrival date & time 01/08/14  1153 History   First MD Initiated Contact with Patient 01/08/14 1218     Chief Complaint  Patient presents with  . Jaw Pain     (Consider location/radiation/quality/duration/timing/severity/associated sxs/prior Treatment) Patient is a 43 y.o. male presenting with tooth pain. The history is provided by the patient. No language interpreter was used.  Dental Pain Location:  Lower Lower teeth location:  21/LL 1st bicuspid Quality:  Aching Severity:  Moderate Onset quality:  Gradual Timing:  Constant Progression:  Worsening Chronicity:  New Context: abscess   Previous work-up:  Dental exam Relieved by:  Nothing Worsened by:  Nothing tried Ineffective treatments:  None tried Associated symptoms: no congestion   Risk factors: no alcohol problem   Pt complains of swelling to lower chin and face.   Pt reports no dental pain.   Pt thinks he has an abscess to his face.   Pt reports feels like swelling goes to his neck  Past Medical History  Diagnosis Date  . Back pain   . Seizures   . History of ETOH abuse   . Bipolar 1 disorder   . Myocardial infarction   . Anxiety   . Depression   . GERD (gastroesophageal reflux disease)    Past Surgical History  Procedure Laterality Date  . Back surgery    . Appendectomy    . Abdominal surgery     Family History  Problem Relation Age of Onset  . Hypertension Mother   . CAD Father   . COPD Father   . Stroke Father   . Testicular cancer Brother    History  Substance Use Topics  . Smoking status: Former Smoker -- 0.50 packs/day    Types: Cigarettes  . Smokeless tobacco: Never Used  . Alcohol Use: No     Comment: last drink over 1 year ago    Review of Systems  HENT: Negative for congestion.   All other systems reviewed and are negative.     Allergies  Benadryl; Celecoxib; Prednisone; Rofecoxib; and Nsaids  Home Medications   Prior to Admission medications   Medication  Sig Start Date End Date Taking? Authorizing Provider  busPIRone (BUSPAR) 15 MG tablet Take 15 mg by mouth 3 (three) times daily.    Historical Provider, MD  citalopram (CELEXA) 20 MG tablet Take 20 mg by mouth daily.    Historical Provider, MD  gabapentin (NEURONTIN) 800 MG tablet Take 800 mg by mouth 3 (three) times daily.    Historical Provider, MD  methylPREDNISolone (MEDROL DOSEPAK) 4 MG tablet Take according to package 11/15/13   Gwyneth SproutWhitney Plunkett, MD  OXcarbazepine (TRILEPTAL) 150 MG tablet Take 150 mg by mouth 2 (two) times daily.    Historical Provider, MD  oxyCODONE (ROXICODONE) 5 MG immediate release tablet Take 5 mg by mouth every 4 (four) hours as needed for moderate pain or severe pain.    Historical Provider, MD  oxyCODONE (ROXICODONE) 5 MG immediate release tablet Take 1-2 tablets (5-10 mg total) by mouth every 4 (four) hours as needed for severe pain. 11/15/13   Gwyneth SproutWhitney Plunkett, MD  traZODone (DESYREL) 100 MG tablet Take 100 mg by mouth at bedtime as needed for sleep.     Historical Provider, MD   BP 166/95  Pulse 64  Temp(Src) 98.4 F (36.9 C) (Oral)  Resp 16  Ht 6' (1.829 m)  Wt 272 lb (123.378 kg)  BMI 36.88 kg/m2  SpO2 97%  Physical Exam  Nursing note and vitals reviewed. Constitutional: He is oriented to person, place, and time. He appears well-developed and well-nourished.  HENT:  Head: Normocephalic and atraumatic.  Eyes: EOM are normal. Pupils are equal, round, and reactive to light.  Neck: Normal range of motion.  Cardiovascular: Normal rate.   Pulmonary/Chest: Effort normal.  Abdominal: Soft. He exhibits no distension.  Musculoskeletal: Normal range of motion.  Neurological: He is alert and oriented to person, place, and time.  Skin: Skin is warm.  Psychiatric: He has a normal mood and affect.    ED Course  Procedures (including critical care time) Labs Review Labs Reviewed  COMPREHENSIVE METABOLIC PANEL - Abnormal; Notable for the following:    Glucose,  Bld 113 (*)    All other components within normal limits  CBC WITH DIFFERENTIAL    Imaging Review Ct Maxillofacial W/cm  01/08/2014   CLINICAL DATA:  Chin swelling.  Jaw pain.  Evaluate for abscess.  EXAM: CT MAXILLOFACIAL WITH CONTRAST  TECHNIQUE: Multidetector CT imaging of the maxillofacial structures was performed with intravenous contrast. Multiplanar CT image reconstructions were also generated. A small metallic BB was placed on the right temple in order to reliably differentiate right from left.  CONTRAST:  35mL OMNIPAQUE IOHEXOL 300 MG/ML  SOLN  COMPARISON:  None.  FINDINGS: There is slight lucency surrounding the root of the remaining left lower bicuspid. See image 27 of series 2. The second bicuspid and molars have been removed. Caries involving several teeth are noted and are marked on sagittal reconstruction imaging. No associated abscess. Specifically, there is no abscess involving the soft tissues over the left side of the mandible. No evidence of edema in the floor of the mouth or in the base of the tongue. Airway is patent. No evidence of abnormal adenopathy. Tiny sub cm hypodensity in the right thyroid gland is noted. Only a portion of the thyroid gland was imaged. Carotids and jugular veins are grossly patent. Larynx is unremarkable.  IMPRESSION: There is lucency surrounding a left lower bicuspid worrisome for periodontal abscess. No evidence of soft tissue abscess. Several caries are associated.  Sub cm hypodensity in the partially imaged right thyroid gland. Ultrasound is recommended.   Electronically Signed   By: Maryclare Bean M.D.   On: 01/08/2014 13:52     EKG Interpretation None      MDM   Final diagnoses:  Dental abscess     Ct shows abscess at left lower bicuspid.   Pt given Iv clindamycin.  Pt referred to Dr.Farless Dentist on call. Pt given rx for clindamycin and hydrocodone.   Pt advised to see Md for thyroid evaluation  Elson Areas, PA-C 01/08/14 1508

## 2014-01-08 NOTE — ED Provider Notes (Signed)
History/physical exam/procedure(s) were performed by non-physician practitioner and as supervising physician I was immediately available for consultation/collaboration. I have reviewed all notes and am in agreement with care and plan.   Hilario Quarry, MD 01/08/14 [phone]

## 2014-01-08 NOTE — ED Notes (Signed)
Pt sts pain in left lower jaw with tenderness over swollen area. Pain radiates up to left side of face and head. No teeth present on left lower side, pt reports they were all pulled 1 year ago. Denis any injury to area. Small area of discoloration inside mouth between gum and cheek on left side.

## 2014-01-08 NOTE — Discharge Instructions (Signed)
Abscessed Tooth An abscessed tooth is an infection around your tooth. It may be caused by holes or damage to the tooth (cavity) or a dental disease. An abscessed tooth causes mild to very bad pain in and around the tooth. See your dentist right away if you have tooth or gum pain. HOME CARE  Take your medicine as told. Finish it even if you start to feel better.  Do not drive after taking pain medicine.  Rinse your mouth (gargle) often with salt water ( teaspoon salt in 8 ounces of warm water).  Do not apply heat to the outside of your face. GET HELP RIGHT AWAY IF:   You have a temperature by mouth above 102 F (38.9 C), not controlled by medicine.  You have chills and a very bad headache.  You have problems breathing or swallowing.  Your mouth will not open.  You develop puffiness (swelling) on the neck or around the eye.  Your pain is not helped by medicine.  Your pain is getting worse instead of better. MAKE SURE YOU:   Understand these instructions.  Will watch your condition.  Will get help right away if you are not doing well or get worse. Document Released: 01/12/2008 Document Revised: 10/18/2011 Document Reviewed: 11/03/2010 [facility] Patient Information 2014 [place], [place].  Emergency Department Resource Guide 1) Find a Doctor and Pay Out of Pocket Although you won't have to find out who is covered by your insurance plan, it is a good idea to ask around and get recommendations. You will then need to call the office and see if the doctor you have chosen will accept you as a new patient and what types of options they offer for patients who are self-pay. Some doctors offer discounts or will set up payment plans for their patients who do not have insurance, but you will need to ask so you aren't surprised when you get to your appointment.  2) Contact Your Local Health Department Not all health departments have doctors that can see patients for sick visits, but many do,  so it is worth a call to see if yours does. If you don't know where your local health department is, you can check in your phone book. The CDC also has a tool to help you locate your state's health department, and many state websites also have listings of all of their local health departments.  3) Find a Walk-in Clinic If your illness is not likely to be very severe or complicated, you may want to try a walk in clinic. These are popping up all over the country in pharmacies, drugstores, and shopping centers. They're usually staffed by nurse practitioners or physician assistants that have been trained to treat common illnesses and complaints. They're usually fairly quick and inexpensive. However, if you have serious medical issues or chronic medical problems, these are probably not your best option.  No Primary Care Doctor: - Call Health Connect at  [phone] - they can help you locate a primary care doctor that  accepts your insurance, provides certain services, etc. - Physician Referral Service- [phone]  Chronic Pain Problems: Organization         Address  Phone   Notes  [facility]  [phone] Patients need to be referred by their primary care doctor.   Medication Assistance: Organization         Address  Phone   Notes  [facility] Medication Assistance Program [phone]  [place], [place] [address] [phone] --Must be a resident of [facility] -- Must have NO insurance coverage whatsoever (no Medicaid/ Medicare, etc.) -- The pt. MUST have a primary care doctor that directs their care regularly and follows them in the community   [facility]ist  [phone]   [employer]  [phone]    Agencies that provide inexpensive medical care: Organization         Address  Phone   Notes  [place]  [phone]   [facility]    [phone]   [facility] [place], [place] [address] [phone]   [place] [address] [place] [phone]   Planned Parenthood    [phone]   [facility]    [phone]   [place] [address], [place] Phone:  [phone], Fax:  [phone] Hours of Operation:  9 am - 6 pm, M-F.  Also accepts Medicaid/Medicare and self-pay.  [facility] for [place] [address], [address], [place] Phone: [phone], Fax: [phone]. Hours of Operation:  8:30 am - 5:30 pm, M-F.  Also accepts Medicaid and self-pay.  [facility] [place] [address], [place] Phone: [phone]   [place], [place], [place] [phone], Ext. [phone] Mondays & Thursdays: 7-9 AM.  First 15 patients are seen on a first come, first serve basis.    [place] Providers:  Organization         Address  Phone   Notes  [facility] [address], [place] [phone] Also accepts self-pay patients.  [facility] [address], [place]  [phone]   [place], [address], [place] [phone]   [facility] [address] [place] [phone]   Lucianne Lei [address], [place]   [phone] Only accepts [place] Access [place] patients after they have their name applied to their card.   Self-Pay (no insurance) in [facility]:  Organization         Address  Phone   Notes  Sickle Cell Patients, [facility] [place] [phone]   [facility] [place] [phone]   [facility] [place]  [place], [place], [place] [phone]   [facility]/Dr. Osei-Bonsu  [address] [place] or  [address], [place] [phone] Phone number for both [place] and [place] locations is the same.  [facility] [address], [place] [phone]   [facility] [address] [place] or [address] [phone]   [place] [address] [place] [phone], phone; [phone], fax Sees patients 1st and 3rd Saturday of every month.  Must not qualify for public or private insurance (i.e. Medicaid, Medicare, [place] Health Choice, Veterans' Benefits)  Household income should be no more than 200% of the poverty level The clinic cannot treat you if you are pregnant or think you are pregnant  Sexually transmitted diseases are not treated at the clinic.    Dental Care: Organization  Address  Phone  Notes  [facility] [place], [place] [phone] Accepts children up to age 43 who are enrolled in [place] or [place]; pregnant women with a Medicaid card; and children who have applied for Medicaid or [place] Health Choice, but were declined, whose parents can pay a reduced fee at time of service.  [facility] Department of [facility]  [address], [place] [phone] Accepts children up to age 26 who are enrolled in [place] or [place]; pregnant women with a Medicaid card; and children who have applied for Medicaid or [place] Health Choice, but were declined, whose parents can pay a reduced fee at time of service.  [place] Adult Dental Access PROGRAM  [place] [phone] Patients are seen by appointment only. Walk-ins are not accepted. [place] will see patients 78 years of age and older. Monday - Tuesday (8am-5pm) Most Wednesdays (8:30-5pm) $30 per visit, cash only  [facility] Adult Dental Access PROGRAM  [address], [facility] [phone] Patients are seen by appointment only. Walk-ins are not accepted. [place] will see patients 2 years of age and older. One Wednesday Evening (Monthly: Volunteer Based).  $30 per visit, cash only  [place]  [phone] for adults; Children under age 62, call Graduate Pediatric Dentistry at [phone]. Children aged 14-14, please call [phone] to request a pediatric application.  Dental services are provided in all areas of dental care including fillings, crowns and [place], complete and partial dentures, implants, gum treatment, root canals, and extractions. Preventive care is also provided. Treatment is provided to both adults and children. Patients are selected via a lottery and there is often a waiting list.   [facility] [address] [place]  [phone] [email or web address]   [facility] [address] [place], [place] [phone], Ext. [phone] Second and Fourth Thursday of each month, opens at 6:30 AM; Clinic ends at 9 AM.  Patients are seen on a first-come first-served basis, and a limited number are seen during each clinic.   [facility]  [address] Hillard Danker [place], [place] [phone]   Eligibility Requirements You must have lived in [place], [place], or  counties for at least the last three months.   You cannot be eligible for state or federal sponsored [employer], including [employer], [place], or [employer].   You generally cannot be eligible for healthcare insurance through your employer.    How to apply: Eligibility screenings are held every Tuesday and Wednesday afternoon from 1:00 pm until 4:00 pm. You do not need an appointment for the interview!  [facility] [address], [place], [place]   [place]  [place] Department  [place]  [phone]    Behavioral Health Resources in the Community: Intensive Outpatient Programs Organization         Address  Phone  Notes  [place]. [address], [place], [place] [phone]   [facility] Outpatient [address] [place], [place]   ADS: Alcohol & Drug Svcs [address], [place], [place]   [place] [address],  [place], [place] or [phone]   Substance Abuse Resources  Organization         Address  Phone  Notes  [facility] Drug Services  [phone]   [facility]  [phone]   [facility]  [phone]   [facility]  [phone]   Residential & Outpatient Substance Abuse Program  [phone]   Psychological Services Organization         Address  Phone  Notes  [facility]  [phone]- [phone]   [facility]  [phone]- [phone]   [facility] [address], [place] [phone] or [phone]    Mobile Crisis Teams Organization         Address  Phone  Notes  [facility], Mobile Crisis Care Unit  [phone]   [facility]  [address] [place], [place] [phone]   Doristine LocksSharon DeEsch [address] [place] [phone]    Self-Help/Support Groups Organization         Address  Phone             Notes  [facility] [place] - variety of support groups  336- [phone] Call for more information  Narcotics Anonymous (NA), [facility] [address], [place]  2 meetings at this location   Statisticianesidential Treatment Programs Organization         Address  Phone  Notes  [facility] [address],    [place]  [phone]   [facility]  [address], [place] [phone], [place], [place] [phone]   [facility] [address], [place] [phone]  Admissions: 8am-3pm M-F  [facility] [address]    [place], [place] [phone]   [facility] [address], [place], [place] [phone]   [facility] [address]  [place], [place] [phone]   [facility] - Intensive Outpatient [address], [place], [place] [phone]   [facility] ([facility] [address]  [place], [place] [phone] or [phone]   [facility] (RTS) [address] [place], [place] [phone] Accepts Medicaid  [facility] [address]  [place] [phone] Substance Abuse/Addiction Treatment   [facility]al Health Resources Organization         Address  Phone  Notes  [facility]  [phone]) [phone]   Angie FavaJulie Brannon, PhD [address], [address] [place], [place]   [phone] or [phone]   [facility]   [address] [place], [place] [phone]   [facility] [address], [place], [place] [phone](336) [phone] Insurance/Medicaid/sponsorship through [facility]  [facility] Families [address]                                    [place], [place] [phone](336) [phone] Therapy/tele-psych/case  [facility] [address]   [place], [place] [phone]) [phone]    Dr. Lolly MustacheArfeen  [phone]   [facility] [place]  [employer] [facility] 1) [address], [place] 2) [address], [place] 3)  [address], [place] [phone]) [phone](336) [phone]  [phone]   [facility] Child Abuse Hotline [phone] or [phone] (After Hours)     See dentist for evaluation.   See your primary care Md for thyroid evaluation

## 2014-01-26 ENCOUNTER — Encounter (HOSPITAL_BASED_OUTPATIENT_CLINIC_OR_DEPARTMENT_OTHER): Payer: Self-pay | Admitting: Emergency Medicine

## 2014-01-26 ENCOUNTER — Emergency Department (HOSPITAL_BASED_OUTPATIENT_CLINIC_OR_DEPARTMENT_OTHER)
Admission: EM | Admit: 2014-01-26 | Discharge: 2014-01-26 | Disposition: A | Discharge status: Home / Self Care | Payer: Medicare Other | Attending: Emergency Medicine | Admitting: Emergency Medicine

## 2014-01-26 ENCOUNTER — Emergency Department (HOSPITAL_BASED_OUTPATIENT_CLINIC_OR_DEPARTMENT_OTHER): Payer: Medicare Other

## 2014-01-26 DIAGNOSIS — I252 Old myocardial infarction: Secondary | ICD-10-CM | POA: Insufficient documentation

## 2014-01-26 DIAGNOSIS — G4485 Primary stabbing headache: Secondary | ICD-10-CM

## 2014-01-26 DIAGNOSIS — Z8719 Personal history of other diseases of the digestive system: Secondary | ICD-10-CM | POA: Insufficient documentation

## 2014-01-26 DIAGNOSIS — F411 Generalized anxiety disorder: Secondary | ICD-10-CM | POA: Insufficient documentation

## 2014-01-26 DIAGNOSIS — M549 Dorsalgia, unspecified: Secondary | ICD-10-CM | POA: Insufficient documentation

## 2014-01-26 DIAGNOSIS — Z87891 Personal history of nicotine dependence: Secondary | ICD-10-CM | POA: Insufficient documentation

## 2014-01-26 DIAGNOSIS — Z79899 Other long term (current) drug therapy: Secondary | ICD-10-CM | POA: Insufficient documentation

## 2014-01-26 DIAGNOSIS — G40909 Epilepsy, unspecified, not intractable, without status epilepticus: Secondary | ICD-10-CM | POA: Insufficient documentation

## 2014-01-26 DIAGNOSIS — Z792 Long term (current) use of antibiotics: Secondary | ICD-10-CM | POA: Insufficient documentation

## 2014-01-26 DIAGNOSIS — F319 Bipolar disorder, unspecified: Secondary | ICD-10-CM | POA: Insufficient documentation

## 2014-01-26 DIAGNOSIS — R51 Headache: Secondary | ICD-10-CM | POA: Insufficient documentation

## 2014-01-26 DIAGNOSIS — M542 Cervicalgia: Secondary | ICD-10-CM | POA: Insufficient documentation

## 2014-01-26 MED ORDER — HYDROMORPHONE HCL PF 1 MG/ML IJ SOLN
1.0000 mg | Freq: Once | INTRAMUSCULAR | Status: AC
  Administered 2014-01-26: 1 mg via INTRAVENOUS
  Filled 2014-01-26: qty 1

## 2014-01-26 MED ORDER — SUMATRIPTAN SUCCINATE 100 MG PO TABS: 100.0000 mg | ORAL_TABLET | ORAL | Status: DC | PRN

## 2014-01-26 MED ORDER — HYDROCODONE-ACETAMINOPHEN 5-325 MG PO TABS: 2.0000 | ORAL_TABLET | ORAL | Status: DC | PRN

## 2014-01-26 MED ORDER — ONDANSETRON HCL 4 MG/2ML IJ SOLN
4.0000 mg | Freq: Once | INTRAMUSCULAR | Status: AC
  Administered 2014-01-26: 4 mg via INTRAMUSCULAR
  Filled 2014-01-26: qty 2

## 2014-01-26 MED ORDER — SODIUM CHLORIDE 0.9 % IV SOLN
Freq: Once | INTRAVENOUS | Status: AC
  Administered 2014-01-26: 19:00:00 via INTRAVENOUS

## 2014-01-26 NOTE — Discharge Instructions (Signed)

## 2014-01-26 NOTE — ED Provider Notes (Signed)
History/physical exam/procedure(s) were performed by non-physician practitioner and as supervising physician I was immediately available for consultation/collaboration. I have reviewed all notes and am in agreement with care and plan.   Hilario Quarryanielle S Hawa Henly, MD 01/26/14 2308

## 2014-01-26 NOTE — ED Notes (Signed)
Pt with previous facial pain reports neck and head pain for one week that "I honestly don't know what it is, but I wanted to get it checked out." Pt reports he still has Vicodin from PCP, and is being set up with a pain clinic for his lower back.
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
## 2014-09-02 ENCOUNTER — Emergency Department (HOSPITAL_BASED_OUTPATIENT_CLINIC_OR_DEPARTMENT_OTHER): Payer: Medicare Other

## 2014-09-02 ENCOUNTER — Encounter (HOSPITAL_BASED_OUTPATIENT_CLINIC_OR_DEPARTMENT_OTHER): Payer: Self-pay | Admitting: *Deleted

## 2014-09-02 ENCOUNTER — Emergency Department (HOSPITAL_BASED_OUTPATIENT_CLINIC_OR_DEPARTMENT_OTHER)
Admission: EM | Admit: 2014-09-02 | Discharge: 2014-09-02 | Disposition: A | Discharge status: Home / Self Care | Payer: Medicare Other | Attending: Emergency Medicine | Admitting: Emergency Medicine

## 2014-09-02 DIAGNOSIS — Y9389 Activity, other specified: Secondary | ICD-10-CM | POA: Insufficient documentation

## 2014-09-02 DIAGNOSIS — G8929 Other chronic pain: Secondary | ICD-10-CM | POA: Diagnosis not present

## 2014-09-02 DIAGNOSIS — Z8719 Personal history of other diseases of the digestive system: Secondary | ICD-10-CM | POA: Insufficient documentation

## 2014-09-02 DIAGNOSIS — Y998 Other external cause status: Secondary | ICD-10-CM | POA: Diagnosis not present

## 2014-09-02 DIAGNOSIS — F419 Anxiety disorder, unspecified: Secondary | ICD-10-CM | POA: Insufficient documentation

## 2014-09-02 DIAGNOSIS — I252 Old myocardial infarction: Secondary | ICD-10-CM | POA: Insufficient documentation

## 2014-09-02 DIAGNOSIS — Z79899 Other long term (current) drug therapy: Secondary | ICD-10-CM | POA: Insufficient documentation

## 2014-09-02 DIAGNOSIS — S3992XA Unspecified injury of lower back, initial encounter: Secondary | ICD-10-CM | POA: Insufficient documentation

## 2014-09-02 DIAGNOSIS — W1839XA Other fall on same level, initial encounter: Secondary | ICD-10-CM | POA: Diagnosis not present

## 2014-09-02 DIAGNOSIS — Z87891 Personal history of nicotine dependence: Secondary | ICD-10-CM | POA: Diagnosis not present

## 2014-09-02 DIAGNOSIS — F319 Bipolar disorder, unspecified: Secondary | ICD-10-CM | POA: Diagnosis not present

## 2014-09-02 DIAGNOSIS — Y9289 Other specified places as the place of occurrence of the external cause: Secondary | ICD-10-CM | POA: Insufficient documentation

## 2014-09-02 DIAGNOSIS — M545 Low back pain: Secondary | ICD-10-CM

## 2014-09-02 DIAGNOSIS — W19XXXA Unspecified fall, initial encounter: Secondary | ICD-10-CM

## 2014-09-02 HISTORY — DX: Dorsalgia, unspecified: M54.9

## 2014-09-02 HISTORY — DX: Other chronic pain: G89.29

## 2014-09-02 MED ORDER — HYDROCODONE-ACETAMINOPHEN 5-325 MG PO TABS
2.0000 | ORAL_TABLET | Freq: Once | ORAL | Status: AC
  Administered 2014-09-02: 2 via ORAL
  Filled 2014-09-02: qty 2

## 2014-09-02 MED ORDER — HYDROCODONE-ACETAMINOPHEN 5-325 MG PO TABS: 1.0000 | ORAL_TABLET | ORAL | Status: DC | PRN

## 2014-09-02 MED ORDER — KETOROLAC TROMETHAMINE 60 MG/2ML IM SOLN
60.0000 mg | Freq: Once | INTRAMUSCULAR | Status: AC
  Administered 2014-09-02: 60 mg via INTRAMUSCULAR
  Filled 2014-09-02: qty 2

## 2014-09-02 NOTE — Discharge Instructions (Signed)
Return to the emergency room with worsening of symptoms, new symptoms or with symptoms that are concerning , especially fevers, loss of control of bladder or bowels, numbness or tingling around genital region or anus, weakness. RICE: Rest, Ice (three cycles of 20 mins on, 48mns off at least twice a day), compression/brace, elevation. Heating pad works well for back pain. Ibuprofen 4070m(2 tablets 20068mevery 5-6 hours for 3-5 days. Norco for severe Pain. Do not operate machinery, drive or drink alcohol while taking narcotics or muscle relaxers. Follow up with PCP/orthopedist if symptoms worsen or are persistent.   Back Injury Prevention Back injuries can be extremely painful and difficult to heal. After having one back injury, you are much more likely to experience another later on. It is important to learn how to avoid injuring or re-injuring your back. The following tips can help you to prevent a back injury. PHYSICAL FITNESS  Exercise regularly and try to develop good tone in your abdominal muscles. Your abdominal muscles provide a lot of the support needed by your back.  Do aerobic exercises (walking, jogging, biking, swimming) regularly.  Do exercises that increase balance and strength (tai chi, yoga) regularly. This can decrease your risk of falling and injuring your back.  Stretch before and after exercising.  Maintain a healthy weight. The more you weigh, the more stress is placed on your back. For every pound of weight, 10 times that amount of pressure is placed on the back. DIET  Talk to your caregiver about how much calcium and vitamin D you need per day. These nutrients help to prevent weakening of the bones (osteoporosis). Osteoporosis can cause broken (fractured) bones that lead to back pain.  Include good sources of calcium in your diet, such as dairy products, green, leafy vegetables, and products with calcium added (fortified).  Include good sources of vitamin D in your  diet, such as milk and foods that are fortified with vitamin D.  Consider taking a nutritional supplement or a multivitamin if needed.  Stop smoking if you smoke. POSTURE  Sit and stand up straight. Avoid leaning forward when you sit or hunching over when you stand.  Choose chairs with good low back (lumbar) support.  If you work at a desk, sit close to your work so you do not need to lean over. Keep your chin tucked in. Keep your neck drawn back and elbows bent at a right angle. Your arms should look like the letter "L."  Sit high and close to the steering wheel when you drive. Add a lumbar support to your car seat if needed.  Avoid sitting or standing in one position for too long. Take breaks to get up, stretch, and walk around at least once every hour. Take breaks if you are driving for long periods of time.  Sleep on your side with your knees slightly bent, or sleep on your back with a pillow under your knees. Do not sleep on your stomach. LIFTING, TWISTING, AND REACHING  Avoid heavy lifting, especially repetitive lifting. If you must do heavy lifting:  Stretch before lifting.  Work slowly.  Rest between lifts.  Use carts and dollies to move objects when possible.  Make several small trips instead of carrying 1 heavy load.  Ask for help when you need it.  Ask for help when moving big, awkward objects.  Follow these steps when lifting:  Stand with your feet shoulder-width apart.  Get as close to the object as you can. Do  not try to pick up heavy objects that are far from your body.  Use handles or lifting straps if they are available.  Bend at your knees. Squat down, but keep your heels off the floor.  Keep your shoulders pulled back, your chin tucked in, and your back straight.  Lift the object slowly, tightening the muscles in your legs, abdomen, and buttocks. Keep the object as close to the center of your body as possible.  When you put a load down, use these  same guidelines in reverse.  Do not:  Lift the object above your waist.  Twist at the waist while lifting or carrying a load. Move your feet if you need to turn, not your waist.  Bend over without bending at your knees.  Avoid reaching over your head, across a table, or for an object on a high surface. OTHER TIPS  Avoid wet floors and keep sidewalks clear of ice to prevent falls.  Do not sleep on a mattress that is too soft or too hard.  Keep items that are used frequently within easy reach.  Put heavier objects on shelves at waist level and lighter objects on lower or higher shelves.  Find ways to decrease your stress, such as exercise, massage, or relaxation techniques. Stress can build up in your muscles. Tense muscles are more vulnerable to injury.  Seek treatment for depression or anxiety if needed. These conditions can increase your risk of developing back pain. SEEK MEDICAL CARE IF:  You injure your back.  You have questions about diet, exercise, or other ways to prevent back injuries. MAKE SURE YOU:  Understand these instructions.  Will watch your condition.  Will get help right away if you are not doing well or get worse. Document Released: 09/02/2004 Document Revised: 10/18/2011 Document Reviewed: 09/06/2011 [employer] Patient Information 2015 [place], [place]. This information is not intended to replace advice given to you by your health care provider. Make sure you discuss any questions you have with your health care provider.

## 2014-09-02 NOTE — ED Provider Notes (Signed)
CSN: [id number]     Arrival date & time 09/02/14  1831 History   First MD Initiated Contact with Patient 09/02/14 1845     Chief Complaint  Patient presents with  . Back Pain     (Consider location/radiation/quality/duration/timing/severity/associated sxs/prior Treatment) HPI  Scott [name] is a 44 y.o. male with PMH of back pain status post 2 surgeries per patient presenting after fall on ice one day ago with complaint of lower back pain worse on right. Patient denies head injury or loss of consciousness. Patient states the pain is like a by this and has gradually gotten worse. Patient denies new numbness, tingling, weakness. Patient states the pain does radiate down both legs. He has not taken anything for his symptoms. No fevers, chills, night sweats, weight loss. No loss of control of bladder or bowel. No numbness/tingling, weakness or saddle anesthesia. No abdominal pain.   Past Medical History  Diagnosis Date  . Back pain   . Seizures   . History of ETOH abuse   . Bipolar 1 disorder   . Myocardial infarction   . Anxiety   . Depression   . GERD (gastroesophageal reflux disease)   . Chronic back pain    Past Surgical History  Procedure Laterality Date  . Back surgery    . Appendectomy    . Abdominal surgery     Family History  Problem Relation Age of Onset  . Hypertension Mother   . CAD Father   . COPD Father   . Stroke Father   . Testicular cancer Brother    History  Substance Use Topics  . Smoking status: Former Smoker -- 0.50 packs/day    Types: Cigarettes  . Smokeless tobacco: Never Used  . Alcohol Use: No     Comment: last drink over 1 year ago    Review of Systems  Constitutional: Negative for fever and chills.  Respiratory: Negative for cough and shortness of breath.   Cardiovascular: Negative for chest pain and palpitations.  Gastrointestinal: Negative for nausea and vomiting.  Musculoskeletal: Positive for back pain. Negative for gait problem.  Skin:  Negative for wound.  Neurological: Negative for weakness and headaches.      Allergies  Benadryl; Celecoxib; Prednisone; Rofecoxib; and Nsaids  Home Medications   Prior to Admission medications   Medication Sig Start Date End Date Taking? Authorizing Provider  busPIRone (BUSPAR) 15 MG tablet Take 15 mg by mouth 3 (three) times daily.    Historical Provider, MD  citalopram (CELEXA) 20 MG tablet Take 20 mg by mouth daily.    Historical Provider, MD  HYDROcodone-acetaminophen (NORCO/VICODIN) 5-325 MG per tablet Take 1-2 tablets by mouth every 4 (four) hours as needed for moderate pain or severe pain. 09/02/14   Louann Sjogren, PA-C  OXcarbazepine (TRILEPTAL) 150 MG tablet Take 150 mg by mouth 2 (two) times daily.    Historical Provider, MD  oxyCODONE (ROXICODONE) 5 MG immediate release tablet Take 5 mg by mouth every 4 (four) hours as needed for moderate pain or severe pain.    Historical Provider, MD  SUMAtriptan (IMITREX) 100 MG tablet Take 1 tablet (100 mg total) by mouth every 2 (two) hours as needed for migraine or headache. May repeat in 2 hours if headache persists or recurs. 01/26/14   Elson Areas, PA-C  traZODone (DESYREL) 100 MG tablet Take 100 mg by mouth at bedtime as needed for sleep.     Historical Provider, MD   BP 146/79 mmHg  Pulse 63  Temp(Src) 97.7 F (36.5 C) (Oral)  Resp 18  Ht 6' (1.829 m)  Wt 318 lb (144.244 kg)  BMI 43.12 kg/m2  SpO2 95% Physical Exam  Constitutional: He appears well-developed and well-nourished. No distress.  HENT:  Head: Normocephalic and atraumatic.  Eyes: Conjunctivae are normal. Right eye exhibits no discharge. Left eye exhibits no discharge.  Cardiovascular: Normal rate, regular rhythm and normal heart sounds.   Pulmonary/Chest: Effort normal and breath sounds normal. No respiratory distress. He has no wheezes.  Abdominal: Soft. Bowel sounds are normal. He exhibits no distension. There is no tenderness.  Musculoskeletal:  No  midline back tenderness, step off or crepitus. Right sided lower back tenderness. No CVA tenderness.   Neurological: He is alert. Coordination normal.  Equal muscle tone. 5/5 strength in lower extremities. DTR equal and intact. Positive R and L straight leg test. Antalgic gait.   Skin: Skin is warm and dry. He is not diaphoretic.  Nursing note and vitals reviewed.   ED Course  Procedures (including critical care time) Labs Review Labs Reviewed - No data to display  Imaging Review Dg Lumbar Spine Complete  09/02/2014   CLINICAL DATA:  Back pain after a fall.  EXAM: LUMBAR SPINE - COMPLETE 4+ VIEW  COMPARISON:  MRI of 11/15/2013  FINDINGS: Transitional anatomy as was detailed on the 11/15/2013 MRI. Sacroiliac joints are symmetric. Maintenance of vertebral body height. Trace L5-S1 retrolisthesis is similar to on the prior exam. Degenerative disc disease at this level is moderate. Other intervertebral disc height is maintained.  IMPRESSION: Spondylosis and trace L5-S1 retrolisthesis, as before. No acute superimposed process.   Electronically Signed   By: Jeronimo GreavesKyle  Talbot M.D.   On: 09/02/2014 20:24     EKG Interpretation None      MDM   Final diagnoses:  Fall, initial encounter  Right low back pain, with sciatica presence unspecified   Patient with back pain. No loss of bowel or bladder control. No saddle anesthesia. No fever, night sweats, weight loss, h/o cancer, IVDU. VSS. No neurological deficits and normal neuro exam. Patient can walk but states is painful. No concern for cauda equina.  RICE protocol and pain medicine indicated and discussed with patient. Driving and sedation precautions provided. Patient is afebrile, nontoxic, and in no acute distress. Patient is appropriate for outpatient management and is stable for discharge. Patient is a follow-up with PCP/orthopedist for persistent symptoms.  Discussed return precautions with patient. Discussed all results and patient verbalizes  understanding and agrees with plan.      Louann SjogrenVictoria L Ellagrace Yoshida, PA-C 09/02/14 2125  Richardean Canalavid H Yao, MD 09/02/14 [id number]

## 2014-09-02 NOTE — ED Notes (Signed)
Pt c/o fall x 1 day ago c/o lower back pain

## 2014-10-28 ENCOUNTER — Emergency Department (HOSPITAL_BASED_OUTPATIENT_CLINIC_OR_DEPARTMENT_OTHER): Payer: Medicare Other

## 2014-10-28 ENCOUNTER — Emergency Department (HOSPITAL_BASED_OUTPATIENT_CLINIC_OR_DEPARTMENT_OTHER)
Admission: EM | Admit: 2014-10-28 | Discharge: 2014-10-28 | Disposition: A | Discharge status: Home / Self Care | Payer: Medicare Other | Attending: Emergency Medicine | Admitting: Emergency Medicine

## 2014-10-28 ENCOUNTER — Encounter (HOSPITAL_BASED_OUTPATIENT_CLINIC_OR_DEPARTMENT_OTHER): Payer: Self-pay | Admitting: Emergency Medicine

## 2014-10-28 DIAGNOSIS — G8929 Other chronic pain: Secondary | ICD-10-CM | POA: Diagnosis not present

## 2014-10-28 DIAGNOSIS — Y93E5 Activity, floor mopping and cleaning: Secondary | ICD-10-CM | POA: Diagnosis not present

## 2014-10-28 DIAGNOSIS — F419 Anxiety disorder, unspecified: Secondary | ICD-10-CM | POA: Insufficient documentation

## 2014-10-28 DIAGNOSIS — I252 Old myocardial infarction: Secondary | ICD-10-CM | POA: Insufficient documentation

## 2014-10-28 DIAGNOSIS — F319 Bipolar disorder, unspecified: Secondary | ICD-10-CM | POA: Insufficient documentation

## 2014-10-28 DIAGNOSIS — Y92009 Unspecified place in unspecified non-institutional (private) residence as the place of occurrence of the external cause: Secondary | ICD-10-CM | POA: Diagnosis not present

## 2014-10-28 DIAGNOSIS — Y998 Other external cause status: Secondary | ICD-10-CM | POA: Diagnosis not present

## 2014-10-28 DIAGNOSIS — S0191XA Laceration without foreign body of unspecified part of head, initial encounter: Secondary | ICD-10-CM

## 2014-10-28 DIAGNOSIS — Z87891 Personal history of nicotine dependence: Secondary | ICD-10-CM | POA: Diagnosis not present

## 2014-10-28 DIAGNOSIS — S0101XA Laceration without foreign body of scalp, initial encounter: Secondary | ICD-10-CM | POA: Diagnosis not present

## 2014-10-28 DIAGNOSIS — Z8719 Personal history of other diseases of the digestive system: Secondary | ICD-10-CM | POA: Diagnosis not present

## 2014-10-28 DIAGNOSIS — W19XXXA Unspecified fall, initial encounter: Secondary | ICD-10-CM

## 2014-10-28 DIAGNOSIS — W1789XA Other fall from one level to another, initial encounter: Secondary | ICD-10-CM | POA: Diagnosis not present

## 2014-10-28 DIAGNOSIS — Z79899 Other long term (current) drug therapy: Secondary | ICD-10-CM | POA: Diagnosis not present

## 2014-10-28 DIAGNOSIS — G40909 Epilepsy, unspecified, not intractable, without status epilepticus: Secondary | ICD-10-CM | POA: Diagnosis not present

## 2014-10-28 DIAGNOSIS — S0990XA Unspecified injury of head, initial encounter: Secondary | ICD-10-CM | POA: Diagnosis present

## 2014-10-28 MED ORDER — OXYCODONE-ACETAMINOPHEN 5-325 MG PO TABS: 1.0000 | ORAL_TABLET | ORAL | Status: DC | PRN

## 2014-10-28 MED ORDER — HYDROMORPHONE HCL 1 MG/ML IJ SOLN
1.0000 mg | Freq: Once | INTRAMUSCULAR | Status: AC
  Administered 2014-10-28: 1 mg via INTRAMUSCULAR
  Filled 2014-10-28: qty 1

## 2014-10-28 MED ORDER — LIDOCAINE-EPINEPHRINE 2 %-1:100000 IJ SOLN
20.0000 mL | Freq: Once | INTRAMUSCULAR | Status: AC
  Administered 2014-10-28: 20 mL
  Filled 2014-10-28: qty 1

## 2014-10-28 MED ORDER — TETANUS-DIPHTH-ACELL PERTUSSIS 5-2.5-18.5 LF-MCG/0.5 IM SUSP: 0.5000 mL | Freq: Once | INTRAMUSCULAR | Status: DC

## 2014-10-28 NOTE — ED Provider Notes (Signed)
CSN: [id number]     Arrival date & time 10/28/14  1931 History   This chart was scribed for Mirian Mo, MD by Abel Presto, ED Scribe. This patient was seen in room MH02/MH02 and the patient's care was started at 7:40 PM.    Chief Complaint  Patient presents with  . Head Injury    Patient is a 44 y.o. male presenting with head injury. The history is provided by the patient. No language interpreter was used.  Head Injury Associated symptoms: no numbness    HPI Comments: Scott [name] is a 44 y.o. male with PMHx of chronic back pain who presents to the Emergency Department complaining of head injury PTA. Pt was on top of house cleaning his gutters. Pt states he fell from roof 10-15 feet onto sidewalk. Pt has laceration to left scalp with active bleeding and abrasion to left forearm with associated pain and back pain. Pt denies abdominal pain, LOC, and numbness, tingling or weakness different than baseline.   Past Medical History  Diagnosis Date  . Back pain   . Seizures   . History of ETOH abuse   . Bipolar 1 disorder   . Myocardial infarction   . Anxiety   . Depression   . GERD (gastroesophageal reflux disease)   . Chronic back pain    Past Surgical History  Procedure Laterality Date  . Back surgery    . Appendectomy    . Abdominal surgery     Family History  Problem Relation Age of Onset  . Hypertension Mother   . CAD Father   . COPD Father   . Stroke Father   . Testicular cancer Brother    History  Substance Use Topics  . Smoking status: Former Smoker -- 0.50 packs/day    Types: Cigarettes  . Smokeless tobacco: Never Used  . Alcohol Use: No     Comment: last drink over 1 year ago    Review of Systems  Gastrointestinal: Negative for abdominal pain.  Musculoskeletal: Positive for myalgias and back pain.  Skin: Positive for wound.  Neurological: Negative for syncope, weakness and numbness.  All other systems reviewed and are negative.     Allergies   Benadryl; Celecoxib; Prednisone; Rofecoxib; and Nsaids  Home Medications   Prior to Admission medications   Medication Sig Start Date End Date Taking? Authorizing Provider  busPIRone (BUSPAR) 15 MG tablet Take 15 mg by mouth 3 (three) times daily.    Historical Provider, MD  citalopram (CELEXA) 20 MG tablet Take 20 mg by mouth daily.    Historical Provider, MD  HYDROcodone-acetaminophen (NORCO/VICODIN) 5-325 MG per tablet Take 1-2 tablets by mouth every 4 (four) hours as needed for moderate pain or severe pain. 09/02/14   Oswaldo Conroy, PA-C  OXcarbazepine (TRILEPTAL) 150 MG tablet Take 150 mg by mouth 2 (two) times daily.    Historical Provider, MD  oxyCODONE (ROXICODONE) 5 MG immediate release tablet Take 5 mg by mouth every 4 (four) hours as needed for moderate pain or severe pain.    Historical Provider, MD  oxyCODONE-acetaminophen (PERCOCET/ROXICET) 5-325 MG per tablet Take 1-2 tablets by mouth every 4 (four) hours as needed. 10/28/14   Mirian Mo, MD  SUMAtriptan (IMITREX) 100 MG tablet Take 1 tablet (100 mg total) by mouth every 2 (two) hours as needed for migraine or headache. May repeat in 2 hours if headache persists or recurs. 01/26/14   Elson Areas, PA-C  traZODone (DESYREL) 100 MG tablet Take 100  mg by mouth at bedtime as needed for sleep.     Historical Provider, MD   BP 138/64 mmHg  Pulse 64  Temp(Src) 98.1 F (36.7 C) (Oral)  Resp 20  Ht 6' (1.829 m)  Wt 308 lb (139.708 kg)  BMI 41.76 kg/m2  SpO2 98% Physical Exam  Constitutional: He is oriented to person, place, and time. He appears well-developed and well-nourished.  HENT:  Head: Normocephalic.  7 cm laceration to L parietal scalp, 1 cm superficial lac to occiput.  Large hematoma over both locations  Eyes: Conjunctivae and EOM are normal.  Neck: Normal range of motion. Neck supple.  Cardiovascular: Normal rate, regular rhythm and normal heart sounds.   Pulmonary/Chest: Effort normal and breath sounds normal.  No respiratory distress.  Abdominal: He exhibits no distension. There is no tenderness. There is no rebound and no guarding.  Musculoskeletal: Normal range of motion.       Cervical back: He exhibits normal range of motion, no tenderness and no bony tenderness.       Thoracic back: Normal.       Lumbar back: He exhibits tenderness. He exhibits no bony tenderness.  Neurological: He is alert and oriented to person, place, and time.  Skin: Skin is warm and dry.  Vitals reviewed.   ED Course  LACERATION REPAIR Date/Time: 10/28/2014 10:05 PM Performed by: Mirian Mo Authorized by: Mirian Mo Consent: Verbal consent obtained. Body area: head/neck Location details: scalp Laceration length: 7 cm Vascular damage: no Anesthesia: local infiltration Local anesthetic: lidocaine 1% with epinephrine Preparation: Patient was prepped and draped in the usual sterile fashion. Irrigation solution: saline Irrigation method: syringe Amount of cleaning: standard Debridement: none Degree of undermining: none Skin closure: staples Number of sutures: 9 Approximation: close Approximation difficulty: simple Dressing: gauze roll Patient tolerance: Patient tolerated the procedure well with no immediate complications   (including critical care time) DIAGNOSTIC STUDIES: Oxygen Saturation is 95% on room air, normal by my interpretation.    COORDINATION OF CARE: 7:43 PM Discussed treatment plan with patient at beside, the patient agrees with the plan and has no further questions at this time.   Labs Review Labs Reviewed - No data to display  Imaging Review Dg Thoracic Spine 2 View  10/28/2014   CLINICAL DATA:  Status post fall off the roof of the his house tonight. Complains of back pain.  EXAM: THORACIC SPINE - 2 VIEW  COMPARISON:  None.  FINDINGS: There is no evidence of thoracic spine fracture. Alignment is normal. No other significant bone abnormalities are identified. There is scoliosis  of spine.  IMPRESSION: No acute fracture or dislocation.   Electronically Signed   By: Sherian Rein M.D.   On: 10/28/2014 20:53   Dg Lumbar Spine Complete  10/28/2014   CLINICAL DATA:  Status post fall off the group of his house tonight. Complains of back pain.  EXAM: LUMBAR SPINE - COMPLETE 4+ VIEW  COMPARISON:  September 02, 2014  FINDINGS: There is no evidence of lumbar spine fracture. Alignment is normal. There are degenerative joint changes of the mid to lower lumbar spine with narrowed joint space and osteophyte formation.  IMPRESSION: No acute fracture or dislocation. Degenerative joint changes of lumbar spine.   Electronically Signed   By: Sherian Rein M.D.   On: 10/28/2014 20:54   Dg Forearm Left  10/28/2014   CLINICAL DATA:  Status post fall off a roof with left arm pain  EXAM: LEFT FOREARM - 2 VIEW  COMPARISON:  None.  FINDINGS: There is no evidence of fracture or dislocation. Soft tissues are unremarkable.  IMPRESSION: No acute fracture or dislocation.   Electronically Signed   By: Sherian Rein M.D.   On: 10/28/2014 21:01   Ct Head Wo Contrast  10/28/2014   CLINICAL DATA:  Status post fall off of roof tonight. Hit the back of the head.  EXAM: CT HEAD WITHOUT CONTRAST  CT CERVICAL SPINE WITHOUT CONTRAST  TECHNIQUE: Multidetector CT imaging of the head and cervical spine was performed following the standard protocol without intravenous contrast. Multiplanar CT image reconstructions of the cervical spine were also generated.  COMPARISON:  January 26, 2014  FINDINGS: CT HEAD FINDINGS  There is no midline shift, hydrocephalus, or mass. No acute hemorrhage or acute transcortical infarct is identified. The bony calvarium is intact. The visualized sinuses are clear. There is left posterior frontal/ anterior parietal scalp hematoma.  CT CERVICAL SPINE FINDINGS  There is no acute fracture or dislocation. The prevertebral soft tissues are normal. There is straightening of the cervical spine either due to  positioning or muscle spasm. There is a mass in the left thyroid gland unchanged compared to prior CT.  IMPRESSION: No focal acute intracranial abnormality identified.  Left posterior frontal/anterior parietal scalp hematoma.  No acute fracture or dislocation of cervical spine.   Electronically Signed   By: Sherian Rein M.D.   On: 10/28/2014 21:23   Ct Cervical Spine Wo Contrast  10/28/2014   CLINICAL DATA:  Status post fall off of roof tonight. Hit the back of the head.  EXAM: CT HEAD WITHOUT CONTRAST  CT CERVICAL SPINE WITHOUT CONTRAST  TECHNIQUE: Multidetector CT imaging of the head and cervical spine was performed following the standard protocol without intravenous contrast. Multiplanar CT image reconstructions of the cervical spine were also generated.  COMPARISON:  January 26, 2014  FINDINGS: CT HEAD FINDINGS  There is no midline shift, hydrocephalus, or mass. No acute hemorrhage or acute transcortical infarct is identified. The bony calvarium is intact. The visualized sinuses are clear. There is left posterior frontal/ anterior parietal scalp hematoma.  CT CERVICAL SPINE FINDINGS  There is no acute fracture or dislocation. The prevertebral soft tissues are normal. There is straightening of the cervical spine either due to positioning or muscle spasm. There is a mass in the left thyroid gland unchanged compared to prior CT.  IMPRESSION: No focal acute intracranial abnormality identified.  Left posterior frontal/anterior parietal scalp hematoma.  No acute fracture or dislocation of cervical spine.   Electronically Signed   By: Sherian Rein M.D.   On: 10/28/2014 21:23   Dg Hand Complete Left  10/28/2014   CLINICAL DATA:  Status post fall off the roof tonight. Complains of fifth digit pain.  EXAM: LEFT HAND - COMPLETE 3+ VIEW  COMPARISON:  None.  FINDINGS: There is no evidence of fracture or dislocation. There is no evidence of arthropathy or other focal bone abnormality. Soft tissues are unremarkable.   IMPRESSION: No acute fracture or dislocation.   Electronically Signed   By: Sherian Rein M.D.   On: 10/28/2014 20:57     EKG Interpretation None      MDM   Final diagnoses:  Fall  Laceration of head, initial encounter     44 y.o. male with pertinent PMH of chronic back pain with R sided weakness presents with laceration to scalp and L arm pain after fall from roof (~13 feet).  No new neuro complaints.  On arrival the patient had vital signs and physical exam as above. No focal neurodeficits.Tdap updated.  Workup unremarkable for acute pathology necessitating consultation or further therapy. Laceration repaired as above. Discharged home in stable condition with standard return precautions for closed head injury laceration care..    I have reviewed all laboratory and imaging studies if ordered as above  1. Laceration of head, initial encounter   2. Nicholaus BloomFall           Matthew Gentry, MD 10/28/14 2208

## 2014-10-28 NOTE — ED Notes (Signed)
Dressings applied wound on arm cleaned

## 2014-10-28 NOTE — Discharge Instructions (Signed)
Staple Care and Removal °Your caregiver has used staples today to repair your wound. Staples are used to help a wound heal faster by holding the edges of the wound together. The staples can be removed when the wound has healed well enough to stay together after the staples are removed. A dressing (wound covering), depending on the location of the wound, may have been applied. This may be changed once per day or as instructed. If the dressing sticks, it may be soaked off with soapy water or hydrogen peroxide. °Only take over-the-counter or prescription medicines for pain, discomfort, or fever as directed by your caregiver.  °If you did not receive a tetanus shot today because you did not recall when your last one was given, check with your caregiver when you have your staples removed to determine if one is needed. °Return to your caregiver's office in 1 week or as suggested to have your staples removed. °SEEK IMMEDIATE MEDICAL CARE IF:  °· You have redness, swelling, or increasing pain in the wound. °· You have pus coming from the wound. °· You have a fever. °· You notice a bad smell coming from the wound or dressing. °· Your wound edges break open after staples have been removed. °Document Released: 04/20/2001 Document Revised: 10/18/2011 Document Reviewed: 05/05/2005 °ExitCare® Patient Information ©2015 [employer]. This information is not intended to replace advice given to you by your health care provider. Make sure you discuss any questions you have with your health care provider. °Head Injury °You have received a head injury. It does not appear serious at this time. Headaches and vomiting are common following head injury. It should be easy to awaken from sleeping. Sometimes it is necessary for you to stay in the emergency department for a while for observation. Sometimes admission to the hospital may be needed. After injuries such as yours, most problems occur within the first 24 hours, but side effects may  occur up to 7-10 days after the injury. It is important for you to carefully monitor your condition and contact your health care provider or seek immediate medical care if there is a change in your condition. °WHAT ARE THE TYPES OF HEAD INJURIES? °Head injuries can be as minor as a bump. Some head injuries can be more severe. More severe head injuries include: °· A jarring injury to the brain (concussion). °· A bruise of the brain (contusion). This mean there is bleeding in the brain that can cause swelling. °· A cracked skull (skull fracture). °· Bleeding in the brain that collects, clots, and forms a bump (hematoma). °WHAT CAUSES A HEAD INJURY? °A serious head injury is most likely to happen to someone who is in a car wreck and is not wearing a seat belt. Other causes of major head injuries include bicycle or motorcycle accidents, sports injuries, and falls. °HOW ARE HEAD INJURIES DIAGNOSED? °A complete history of the event leading to the injury and your current symptoms will be helpful in diagnosing head injuries. Many times, pictures of the brain, such as CT or MRI are needed to see the extent of the injury. Often, an overnight hospital stay is necessary for observation.  °WHEN SHOULD I SEEK IMMEDIATE MEDICAL CARE?  °You should get help right away if: °· You have confusion or drowsiness. °· You feel sick to your stomach (nauseous) or have continued, forceful vomiting. °· You have dizziness or unsteadiness that is getting worse. °· You have severe, continued headaches not relieved by medicine. Only take over-the-counter   or prescription medicines for pain, fever, or discomfort as directed by your health care provider. °· You do not have normal function of the arms or legs or are unable to walk. °· You notice changes in the black spots in the center of the colored part of your eye (pupil). °· You have a clear or bloody fluid coming from your nose or ears. °· You have a loss of vision. °During the next 24 hours  after the injury, you must stay with someone who can watch you for the warning signs. This person should contact local emergency services (911 in the [place] if you have seizures, you become unconscious, or you are unable to wake up. °HOW CAN I PREVENT A HEAD INJURY IN THE FUTURE? °The most important factor for preventing major head injuries is avoiding motor vehicle accidents.  To minimize the potential for damage to your head, it is crucial to wear seat belts while riding in motor vehicles. Wearing helmets while bike riding and playing collision sports (like football) is also helpful. Also, avoiding dangerous activities around the house will further help reduce your risk of head injury.  °WHEN CAN I RETURN TO NORMAL ACTIVITIES AND ATHLETICS? °You should be reevaluated by your health care provider before returning to these activities. If you have any of the following symptoms, you should not return to activities or contact sports until 1 week after the symptoms have stopped: °· Persistent headache. °· Dizziness or vertigo. °· Poor attention and concentration. °· Confusion. °· Memory problems. °· Nausea or vomiting. °· Fatigue or tire easily. °· Irritability. °· Intolerant of bright lights or loud noises. °· Anxiety or depression. °· Disturbed sleep. °MAKE SURE YOU:  °· Understand these instructions. °· Will watch your condition. °· Will get help right away if you are not doing well or get worse. °Document Released: 07/26/2005 Document Revised: 07/31/2013 Document Reviewed: 04/02/2013 °ExitCare® Patient Information ©2015 [employer]. This information is not intended to replace advice given to you by your health care provider. Make sure you discuss any questions you have with your health care provider. ° °

## 2014-10-28 NOTE — ED Notes (Signed)
Pt states he fell off the house 10-15 feet hitting the sidewalk, denies any LOC

## 2014-11-09 ENCOUNTER — Encounter (HOSPITAL_BASED_OUTPATIENT_CLINIC_OR_DEPARTMENT_OTHER): Payer: Self-pay

## 2014-11-09 ENCOUNTER — Emergency Department (HOSPITAL_BASED_OUTPATIENT_CLINIC_OR_DEPARTMENT_OTHER)
Admission: EM | Admit: 2014-11-09 | Discharge: 2014-11-09 | Disposition: A | Discharge status: Home / Self Care | Payer: Medicare Other | Attending: Emergency Medicine | Admitting: Emergency Medicine

## 2014-11-09 DIAGNOSIS — F319 Bipolar disorder, unspecified: Secondary | ICD-10-CM | POA: Diagnosis not present

## 2014-11-09 DIAGNOSIS — Z79899 Other long term (current) drug therapy: Secondary | ICD-10-CM | POA: Insufficient documentation

## 2014-11-09 DIAGNOSIS — Z4802 Encounter for removal of sutures: Secondary | ICD-10-CM | POA: Insufficient documentation

## 2014-11-09 DIAGNOSIS — F419 Anxiety disorder, unspecified: Secondary | ICD-10-CM | POA: Diagnosis not present

## 2014-11-09 DIAGNOSIS — Z8719 Personal history of other diseases of the digestive system: Secondary | ICD-10-CM | POA: Insufficient documentation

## 2014-11-09 DIAGNOSIS — G8929 Other chronic pain: Secondary | ICD-10-CM | POA: Insufficient documentation

## 2014-11-09 DIAGNOSIS — I252 Old myocardial infarction: Secondary | ICD-10-CM | POA: Insufficient documentation

## 2014-11-09 DIAGNOSIS — Z87891 Personal history of nicotine dependence: Secondary | ICD-10-CM | POA: Diagnosis not present

## 2014-11-09 DIAGNOSIS — G40909 Epilepsy, unspecified, not intractable, without status epilepticus: Secondary | ICD-10-CM | POA: Diagnosis not present

## 2014-11-09 MED ORDER — OXYCODONE-ACETAMINOPHEN 5-325 MG PO TABS: 1.0000 | ORAL_TABLET | Freq: Four times a day (QID) | ORAL | Status: DC | PRN

## 2014-11-09 NOTE — Discharge Instructions (Signed)

## 2014-11-09 NOTE — ED Provider Notes (Signed)
CSN: [id number]     Arrival date & time 11/09/14  [id number] History   First MD Initiated Contact with Patient 11/09/14 [phone]     Chief Complaint  Patient presents with  . Suture / Staple Removal     (Consider location/radiation/quality/duration/timing/severity/associated sxs/prior Treatment) Patient is a 44 y.o. male presenting with suture removal. The history is provided by the patient.  Suture / Staple Removal This is a new problem. The current episode started more than 1 week ago. The problem occurs constantly. The problem has not changed since onset.Pertinent negatives include no chest pain, no abdominal pain and no shortness of breath. Nothing aggravates the symptoms. Nothing relieves the symptoms. He has tried nothing for the symptoms.    Past Medical History  Diagnosis Date  . Back pain   . Seizures   . History of ETOH abuse   . Bipolar 1 disorder   . Myocardial infarction   . Anxiety   . Depression   . GERD (gastroesophageal reflux disease)   . Chronic back pain    Past Surgical History  Procedure Laterality Date  . Back surgery    . Appendectomy    . Abdominal surgery     Family History  Problem Relation Age of Onset  . Hypertension Mother   . CAD Father   . COPD Father   . Stroke Father   . Testicular cancer Brother    History  Substance Use Topics  . Smoking status: Former Smoker -- 0.50 packs/day    Types: Cigarettes  . Smokeless tobacco: Never Used  . Alcohol Use: No     Comment: last drink over 1 year ago    Review of Systems  Constitutional: Negative for fever.  Respiratory: Negative for cough and shortness of breath.   Cardiovascular: Negative for chest pain.  Gastrointestinal: Negative for abdominal pain.  All other systems reviewed and are negative.     Allergies  Benadryl; Celecoxib; Prednisone; Rofecoxib; and Nsaids  Home Medications   Prior to Admission medications   Medication Sig Start Date End Date Taking? Authorizing Provider   busPIRone (BUSPAR) 15 MG tablet Take 15 mg by mouth 3 (three) times daily.    Historical Provider, MD  citalopram (CELEXA) 20 MG tablet Take 20 mg by mouth daily.    Historical Provider, MD  HYDROcodone-acetaminophen (NORCO/VICODIN) 5-325 MG per tablet Take 1-2 tablets by mouth every 4 (four) hours as needed for moderate pain or severe pain. 09/02/14   Oswaldo ConroyVictoria Creech, PA-C  OXcarbazepine (TRILEPTAL) 150 MG tablet Take 150 mg by mouth 2 (two) times daily.    Historical Provider, MD  oxyCODONE (ROXICODONE) 5 MG immediate release tablet Take 5 mg by mouth every 4 (four) hours as needed for moderate pain or severe pain.    Historical Provider, MD  oxyCODONE-acetaminophen (PERCOCET/ROXICET) 5-325 MG per tablet Take 1 tablet by mouth every 6 (six) hours as needed for moderate pain. 11/09/14   Elwin MochaBlair Jakera Beaupre, MD  SUMAtriptan (IMITREX) 100 MG tablet Take 1 tablet (100 mg total) by mouth every 2 (two) hours as needed for migraine or headache. May repeat in 2 hours if headache persists or recurs. 01/26/14   Elson AreasLeslie K Sofia, PA-C  traZODone (DESYREL) 100 MG tablet Take 100 mg by mouth at bedtime as needed for sleep.     Historical Provider, MD   BP 150/89 mmHg  Pulse 72  Temp(Src) 98.2 F (36.8 C) (Oral)  Resp 16  Wt 310 lb (140.615 kg)  SpO2 100% Physical  Exam  Constitutional: He is oriented to person, place, and time. He appears well-developed and well-nourished. No distress.  HENT:  Head: Normocephalic.  Mouth/Throat: No oropharyngeal exudate.  Well healing L parietal wound. 9 staples present in stellate formation.  Eyes: EOM are normal. Pupils are equal, round, and reactive to light.  Neck: Normal range of motion. Neck supple.  Cardiovascular: Normal rate and regular rhythm.  Exam reveals no friction rub.   No murmur heard. Pulmonary/Chest: Effort normal and breath sounds normal. No respiratory distress. He has no wheezes. He has no rales.  Abdominal: He exhibits no distension. There is no  tenderness. There is no rebound.  Musculoskeletal: Normal range of motion. He exhibits no edema.  Neurological: He is alert and oriented to person, place, and time.  Skin: He is not diaphoretic.  Nursing note and vitals reviewed.   ED Course  SUTURE REMOVAL Date/Time: 11/09/2014 9:34 AM Performed by: Elwin Mocha Authorized by: Elwin Mocha Consent: Verbal consent obtained. Body area: head/neck Location details: scalp Wound Appearance: clean Staples Removed: 9 Facility: sutures placed in this facility Patient tolerance: Patient tolerated the procedure well with no immediate complications   (including critical care time) Labs Review Labs Reviewed - No data to display  Imaging Review No results found.   EKG Interpretation None      MDM   Final diagnoses:  Encounter for staple removal    79M here for staple removal. Well healed wound, no signs of infection. Still having headaches, occasional nausea/vomiting. A few more pain meds given. Stable for discharge.    Elwin Mocha, MD 11/09/14 [phone]

## 2014-11-09 NOTE — ED Notes (Signed)
Patient here for staple removal from scalp. Complains of ongoing headaches since injury. Had negative CT when incident happened. Wound clean and dry

## 2014-11-09 NOTE — ED Notes (Signed)
Surgical staples removed by MD, site wnl

## 2014-12-12 ENCOUNTER — Encounter (HOSPITAL_COMMUNITY): Payer: Self-pay | Admitting: Emergency Medicine

## 2014-12-12 ENCOUNTER — Emergency Department (HOSPITAL_COMMUNITY)
Admission: EM | Admit: 2014-12-12 | Discharge: 2014-12-13 | Disposition: A | Discharge status: Home / Self Care | Payer: Medicare Other | Attending: Emergency Medicine | Admitting: Emergency Medicine

## 2014-12-12 DIAGNOSIS — I252 Old myocardial infarction: Secondary | ICD-10-CM | POA: Diagnosis not present

## 2014-12-12 DIAGNOSIS — Z008 Encounter for other general examination: Secondary | ICD-10-CM | POA: Diagnosis present

## 2014-12-12 DIAGNOSIS — G8929 Other chronic pain: Secondary | ICD-10-CM | POA: Diagnosis not present

## 2014-12-12 DIAGNOSIS — Z79899 Other long term (current) drug therapy: Secondary | ICD-10-CM | POA: Insufficient documentation

## 2014-12-12 DIAGNOSIS — Z8719 Personal history of other diseases of the digestive system: Secondary | ICD-10-CM | POA: Diagnosis not present

## 2014-12-12 DIAGNOSIS — R45851 Suicidal ideations: Secondary | ICD-10-CM | POA: Insufficient documentation

## 2014-12-12 DIAGNOSIS — Z87891 Personal history of nicotine dependence: Secondary | ICD-10-CM | POA: Insufficient documentation

## 2014-12-12 DIAGNOSIS — F1410 Cocaine abuse, uncomplicated: Secondary | ICD-10-CM

## 2014-12-12 LAB — CBC
HCT: 39.9 % (ref 39.0–52.0)
Hemoglobin: 13.4 g/dL (ref 13.0–17.0)
MCH: 29.5 pg (ref 26.0–34.0)
MCHC: 33.6 g/dL (ref 30.0–36.0)
MCV: 87.7 fL (ref 78.0–100.0)
Platelets: 198 10*3/uL (ref 150–400)
RBC: 4.55 MIL/uL (ref 4.22–5.81)
RDW: 14.1 % (ref 11.5–15.5)
WBC: 8.9 10*3/uL (ref 4.0–10.5)

## 2014-12-12 LAB — COMPREHENSIVE METABOLIC PANEL
ALT: 65 U/L — ABNORMAL HIGH (ref 17–63)
AST: 47 U/L — ABNORMAL HIGH (ref 15–41)
Albumin: 3.4 g/dL — ABNORMAL LOW (ref 3.5–5.0)
Alkaline Phosphatase: 71 U/L (ref 38–126)
Anion gap: 12 (ref 5–15)
BUN: 11 mg/dL (ref 6–20)
CO2: 27 mmol/L (ref 22–32)
Calcium: 8.9 mg/dL (ref 8.9–10.3)
Chloride: 102 mmol/L (ref 101–111)
Creatinine, Ser: 0.84 mg/dL (ref 0.61–1.24)
GFR calc Af Amer: >60 mL/min (ref >60)
GFR calc non Af Amer: >60 mL/min (ref >60)
Glucose, Bld: 154 mg/dL — ABNORMAL HIGH (ref 70–99)
Potassium: 3.6 mmol/L (ref 3.5–5.1)
Sodium: 141 mmol/L (ref 135–145)
Total Bilirubin: 0.7 mg/dL (ref 0.3–1.2)
Total Protein: 6.9 g/dL (ref 6.5–8.1)

## 2014-12-12 LAB — ACETAMINOPHEN LEVEL: Acetaminophen (Tylenol), Serum: <10 ug/mL — ABNORMAL LOW (ref 10–30)

## 2014-12-12 LAB — ETHANOL: Alcohol, Ethyl (B): <5 mg/dL (ref <5)

## 2014-12-12 LAB — SALICYLATE LEVEL: Salicylate Lvl: <4 mg/dL (ref 2.8–30.0)

## 2014-12-12 NOTE — ED Notes (Signed)
Pt st's he was sent [facility] for SI and HI    Pt denies being SI or HI now st's it was this am when he was talking to his counselor.

## 2014-12-12 NOTE — ED Notes (Signed)
This nurse went into room to assess pt, pt not in room.  Tis nurse not notified when pt roomed.  Charge RN notified, EDP made aware.

## 2014-12-13 LAB — RAPID URINE DRUG SCREEN, HOSP PERFORMED
Amphetamines: NOT DETECTED
Barbiturates: NOT DETECTED
Benzodiazepines: POSITIVE — AB
Cocaine: POSITIVE — AB
Opiates: NOT DETECTED
Tetrahydrocannabinol: NOT DETECTED

## 2014-12-13 MED ORDER — POTASSIUM CHLORIDE 20 MEQ/15ML (10%) PO SOLN: 2.5000 meq | Freq: Every day | ORAL | Status: DC

## 2014-12-13 MED ORDER — CITALOPRAM HYDROBROMIDE 10 MG PO TABS: 20.0000 mg | ORAL_TABLET | Freq: Every day | ORAL | Status: DC

## 2014-12-13 MED ORDER — BUSPIRONE HCL 10 MG PO TABS: 15.0000 mg | ORAL_TABLET | Freq: Three times a day (TID) | ORAL | Status: DC

## 2014-12-13 MED ORDER — OXCARBAZEPINE 150 MG PO TABS
150.0000 mg | ORAL_TABLET | Freq: Three times a day (TID) | ORAL | Status: DC
  Filled 2014-12-13: qty 1

## 2014-12-13 MED ORDER — DOCUSATE SODIUM 100 MG PO CAPS: 100.0000 mg | ORAL_CAPSULE | Freq: Two times a day (BID) | ORAL | Status: DC

## 2014-12-13 MED ORDER — RISPERIDONE 0.5 MG PO TABS
2.0000 mg | ORAL_TABLET | Freq: Every day | ORAL | Status: DC
  Administered 2014-12-13: 2 mg via ORAL
  Filled 2014-12-13: qty 4

## 2014-12-13 MED ORDER — GABAPENTIN 400 MG PO CAPS: 800.0000 mg | ORAL_CAPSULE | Freq: Three times a day (TID) | ORAL | Status: DC

## 2014-12-13 NOTE — ED Notes (Signed)
Spoke with TTS on the phone.  Asked to notify pt of spending the night to be further evaluated tomorrow.  Pt asked to speak with Dr Effie ShyWentz about decision.  Dr Effie ShyWentz went in to talk with pt.  Pt then moved to POD C by EMT.

## 2014-12-13 NOTE — ED Provider Notes (Signed)
CSN: [id number]     Arrival date & time 12/12/14  2215 History   First MD Initiated Contact with Patient 12/12/14 2301     Chief Complaint  Patient presents with  . Medical Clearance     (Consider location/radiation/quality/duration/timing/severity/associated sxs/prior Treatment) HPI Comments: He presents to the emergency room after seeing his counselor earlier today. 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 He underwent a new therapy with his psychologist and feels it made him unstable. This was 2 months ago. He was seen by his counselor today and she felt he was exhibiting signs of suicidal and homicidal ideation. The patient denies SI or HI but is voluntarily here "to do whatever is necessary to get better".   The history is provided by the patient and the spouse. No language interpreter was used.    Past Medical History  Diagnosis Date  . Back pain   . Seizures   . History of ETOH abuse   . Bipolar 1 disorder   . Myocardial infarction   . Anxiety   . Depression   . GERD (gastroesophageal reflux disease)   . Chronic back pain    Past Surgical History  Procedure Laterality Date  . Back surgery    . Appendectomy    . Abdominal surgery     Family History  Problem Relation Age of Onset  . Hypertension Mother   . CAD Father   . COPD Father   . Stroke Father   . Testicular cancer Brother    History  Substance Use Topics  . Smoking status: Former Smoker -- 0.50 packs/day    Types: Cigarettes  . Smokeless tobacco: Never Used  . Alcohol Use: No     Comment: last drink over 1 year ago    Review of Systems  Constitutional: Negative for fever and chills.  HENT: Negative.   Respiratory: Negative.   Cardiovascular: Negative.   Gastrointestinal: Negative.   Musculoskeletal: Positive for back pain.       Complains of chronic back pain.  Skin: Negative.   Neurological: Negative.   Psychiatric/Behavioral: Negative.   Negative for suicidal ideas.      Allergies  Benadryl; Celecoxib; Prednisone; Rofecoxib; and Nsaids  Home Medications   Prior to Admission medications   Medication Sig Start Date End Date Taking? Authorizing Provider  busPIRone (BUSPAR) 15 MG tablet Take 15 mg by mouth 3 (three) times daily.   Yes Historical Provider, MD  citalopram (CELEXA) 20 MG tablet Take 20 mg by mouth daily.   Yes Historical Provider, MD  docusate sodium (COLACE) 100 MG capsule Take 100 mg by mouth 2 (two) times daily.   Yes Historical Provider, MD  gabapentin (NEURONTIN) 400 MG capsule Take 800 mg by mouth 3 (three) times daily.   Yes Historical Provider, MD  methadone (DOLOPHINE) 10 MG/ML solution Take 99 mg by mouth daily.   Yes Historical Provider, MD  OXcarbazepine (TRILEPTAL) 150 MG tablet Take 150 mg by mouth 3 (three) times daily.    Yes Historical Provider, MD  Potassium Gluconate 595 MG CAPS Take 1 capsule by mouth daily.   Yes Historical Provider, MD  risperiDONE (RISPERDAL) 2 MG tablet Take 2 mg by mouth at bedtime.   Yes Historical Provider, MD  HYDROcodone-acetaminophen (NORCO/VICODIN) 5-325 MG per tablet Take 1-2 tablets by mouth every 4 (four) hours as needed for moderate pain or severe pain. Patient not taking: Reported on 12/12/2014 09/02/14  Oswaldo ConroyVictoria Creech, PA-C  oxyCODONE-acetaminophen (PERCOCET/ROXICET) 5-325 MG per tablet Take 1 tablet by mouth every 6 (six) hours as needed for moderate pain. Patient not taking: Reported on 12/12/2014 11/09/14   Elwin MochaBlair Walden, MD  SUMAtriptan (IMITREX) 100 MG tablet Take 1 tablet (100 mg total) by mouth every 2 (two) hours as needed for migraine or headache. May repeat in 2 hours if headache persists or recurs. Patient not taking: Reported on 12/12/2014 01/26/14   Elson AreasLeslie K Sofia, PA-C   BP 123/69 mmHg  Pulse 76  Temp(Src) 98.3 F (36.8 C) (Oral)  Resp 18  Ht 6' (1.829 m)  Wt 314 lb (142.429 kg)  BMI 42.58 kg/m2  SpO2 91% Physical Exam  Constitutional: He is  oriented to person, place, and time. He appears well-developed and well-nourished.  Cooperative, calm.   HENT:  Head: Normocephalic.  Neck: Normal range of motion. Neck supple.  Cardiovascular: Normal rate and regular rhythm.   Pulmonary/Chest: Effort normal and breath sounds normal.  Abdominal: Soft. Bowel sounds are normal. There is no tenderness. There is no rebound and no guarding.  Musculoskeletal: Normal range of motion.  Neurological: He is alert and oriented to person, place, and time.  Skin: Skin is warm and dry. No rash noted.  Psychiatric: He has a normal mood and affect.    ED Course  Procedures (including critical care time) Labs Review Labs Reviewed  ACETAMINOPHEN LEVEL - Abnormal; Notable for the following:    Acetaminophen (Tylenol), Serum <10 (*)    All other components within normal limits  COMPREHENSIVE METABOLIC PANEL - Abnormal; Notable for the following:    Glucose, Bld 154 (*)    Albumin 3.4 (*)    AST 47 (*)    ALT 65 (*)    All other components within normal limits  ETHANOL  SALICYLATE LEVEL  CBC  URINE RAPID DRUG SCREEN (HOSP PERFORMED)    Imaging Review No results found.   EKG Interpretation None      MDM   Final diagnoses:  None    1. History of SI  He currently denies any thoughts of suicide or of hurting anyone else. Because of complicated psychiatric history, will have TTS consultation to determine disposition.     Elpidio AnisShari Audree Schrecengost, PA-C 12/13/14 0045  Mancel BaleElliott Wentz, MD 12/13/14 [phone]

## 2014-12-13 NOTE — ED Notes (Signed)
Pt. Left with all belongings and refused wheelchair 

## 2014-12-13 NOTE — Discharge Instructions (Signed)
Avoid using illegal drugs. Follow-up with your counselor at [facility] as usual. Return here, if needed, for problems.    No-harm Safety Contract  A no-harm safety contract is a written or verbal agreement between you and a mental health professional to promote safety. It contains specific actions and promises you agree to. The agreement also includes instructions from the therapist or doctor. The instructions will help prevent you from harming yourself or harming others. Harm can be as mild as pinching yourself, but can increase in intensity to actions like burning or cutting yourself. The extreme level of self-harm would be committing suicide. No-harm safety contracts are also sometimes referred to as a Charity fundraiserno-suicide contract, suicide Financial controllerprevention contract, no-harm agreements or decisions, or a Engineer, manufacturing systemssafety contract.  REASONS FOR NO-HARM SAFETY CONTRACTS Safety contracts are just one part of an overall treatment plan to help keep you safe and free of harm. A safety contract may help to relieve anxiety, restore a sense of control, state clearly the alternatives to harm or suicide, and give you and your therapist or doctor a gauge for how you are doing in between visits. Many factors impact the decision to use a no-harm safety contract and its effectiveness. A proper overall treatment plan and evaluation and good patient understanding are the keys to good outcomes. CONTRACT ELEMENTS  A contract can range from simple to complex. They include all or some of the following:  Action statements. These are statements you agree to do or not do. Example: If I feel my life is becoming too difficult, I agree to do the following so there is no harm to myself or others:  Talk with family or friends.  Rid myself of all things that I could use to harm myself.  Do an activity I enjoy or have enjoyed in the recent past. Coping strategies. These are ways to think and feel that decrease stress, such as:  Use of affirmations or  positive statements about self.  Good self-care, including improved grooming, and healthy eating, and healthy sleeping patterns.  Increase physical exercise.  Increase social involvement.  Focus on positive aspects of life. Crisis management. This would include what to do if there was trouble following the contract or an urge to harm. This might include notifying family or your therapist of suicidal thoughts. Be open and honest about suicidal urges. To prevent a crisis, do the following:  List reasons to reach out for support.  Keep contact numbers and available hours handy. Treatment goals. These are goals would include no suicidal thoughts, improved mood, and feelings of hopefulness. Listed responsibilities of different people involved in care. This could include family members. A family member may agree to remove firearms or other lethal weapons/substances from your ease of access. A timeline. A timeline can be in place from one therapy session to the next session. HOME CARE INSTRUCTIONS   Follow your no-harm safety contract.  Contact your therapist and/or doctor if you have any questions or concerns. MAKE SURE YOU:   Understand these instructions.  Will watch your condition. Noticing any mood changes or suicidal urges.  Will get help right away if you are not doing well or get worse. Document Released: 01/13/2010 Document Revised: 10/18/2011 Document Reviewed: 01/13/2010 [facility] Patient Information 2015 [place], [place]. This information is not intended to replace advice given to you by your health care provider. Make sure you discuss any questions you have with your health care provider.  Suicidal Feelings, How to Help Yourself Everyone feels sad  or unhappy at times, but depressing thoughts and feelings of hopelessness can lead to thoughts of suicide. It can seem as if life is too tough to handle. If you feel as though you have reached the point where suicide is the only answer,  it is time to let someone know immediately.  HOW TO COPE AND PREVENT SUICIDE  Let family, friends, teachers, or counselors know. Get help. Try not to isolate yourself from those who care about you. Even though you may not feel sociable, talk with someone every day. It is best if it is face-to-face. Remember, they will want to help you.  Eat a regularly spaced and well-balanced diet.  Get plenty of rest.  Avoid alcohol and drugs because they will only make you feel worse and may also lower your inhibitions. Remove them from the home. If you are thinking of taking an overdose of your prescribed medicines, give your medicines to someone who can give them to you one day at a time. If you are on antidepressants, let your caregiver know of your feelings so he or she can provide a safer medicine, if that is a concern.  Remove weapons or poisons from your home.  Try to stick to routines. Follow a schedule and remind yourself that you have to keep that schedule every day.  Set some realistic goals and achieve them. Make a list and cross things off as you go. Accomplishments give a sense of worth. Wait until you are feeling better before doing things you find difficult or unpleasant to do.  If you are able, try to start exercising. Even half-hour periods of exercise each day will make you feel better. Getting out in the sun or into nature helps you recover from depression faster. If you have a favorite place to walk, take advantage of that.  Increase safe activities that have always given you pleasure. This may include playing your favorite music, reading a good book, painting a picture, or playing your favorite instrument. Do whatever takes your mind off your depression.  Keep your living space well-lighted. GET HELP Contact a suicide hotline, crisis center, or local suicide prevention center for help right away. Local centers may include a hospital, clinic, community service organization, social  service provider, or health department.  Call your local emergency services (911 in the [place]).  Call a suicide hotline:  1-800-273-TALK ((401)861-19101-(838)301-3987) in the [place].  1-800-SUICIDE 305 128 8918(1-(319) 125-5799) in the [place].  612-679-44701-561-462-8306 in the [place] for Spanish-speaking counselors.  2-725-366-4QIH1-800-799-4TTY (438)173-8360(1-410-867-0863) in the [place] for TTY users.  Visit the following websites for information and help:  National Suicide Prevention Lifeline: www.suicidepreventionlifeline.org  Hopeline: www.hopeline.com  [employer] Foundation for Suicide Prevention: https://www.ayers.com/www.afsp.org  For lesbian, gay, bisexual, transgender, or questioning youth, contact The [employer]or Project:  5-643-3-I-RJJOAC1-866-4-U-TREVOR 403-026-9192(1-9194199501) in the [place].  www.thetrevorproject.org  In [place], treatment resources are listed in each province with listings available under [employer] for [employer]Health Services or similar titles. Another source for Crisis Centres by [place] is located at http://www.suicideprevention.ca/in-crisis-now/find-a-crisis-centre-now/crisis-centres Document Released: 01/30/2003 Document Revised: 10/18/2011 Document Reviewed: 11/20/2013 [facility] Patient Information 2015 [place], [place]. This information is not intended to replace advice given to you by your health care provider. Make sure you discuss any questions you have with your health care provider.

## 2014-12-13 NOTE — BH Assessment (Addendum)
Per Hulan FessIjeoma Nwaeze, NP she would like pt to have an AM psychiatric evaluation for final disposition due to the complicated nature of his case, his counselor's concerns regarding SI and HI and recent relapse with substances.   Spoke with Dr. Effie ShyWentz regarding recommendations and he is in agreement.   Spoke with RN who will inform pt.  Dr. Jerelyn CharlesWenz called stating pt is denying SI, HI and feels identity "crisis" is under control. Per Hulan FessIjeoma Nwaeze, NP she would prefer pt remain and be seen by psychiatry but does not feel he meets criteria for IVC. If pt refuses to remain in ED he can be discharged once he signs no harm contract.   Clista BernhardtNancy Borden Thune, [facility] Triage Specialist 12/13/2014 1:08 AM

## 2014-12-13 NOTE — BH Assessment (Signed)
Reviewed ED notes prior to initiating assessment. Pt was sent from methadone clinic due to SI and HI. Pt reports this was earlier in the day and denies in ED. Per ED hx [facility] for Major Depressive Disorder.   Requested cart be placed with pt for assessment.    Assessment to commence shortly.   Clista BernhardtNancy Derrius Furtick, [facility] Triage Specialist 12/13/2014 12:05 AM
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
## 2014-12-25 ENCOUNTER — Emergency Department (HOSPITAL_COMMUNITY)
Admission: EM | Admit: 2014-12-25 | Discharge: 2014-12-26 | Disposition: A | Discharge status: Other Acute Inpatient Hospital | Payer: Medicare Other | Attending: Emergency Medicine | Admitting: Emergency Medicine

## 2014-12-25 ENCOUNTER — Encounter (HOSPITAL_COMMUNITY): Payer: Self-pay | Admitting: Emergency Medicine

## 2014-12-25 DIAGNOSIS — F319 Bipolar disorder, unspecified: Secondary | ICD-10-CM | POA: Diagnosis not present

## 2014-12-25 DIAGNOSIS — Z8739 Personal history of other diseases of the musculoskeletal system and connective tissue: Secondary | ICD-10-CM | POA: Insufficient documentation

## 2014-12-25 DIAGNOSIS — F419 Anxiety disorder, unspecified: Secondary | ICD-10-CM | POA: Diagnosis not present

## 2014-12-25 DIAGNOSIS — F1410 Cocaine abuse, uncomplicated: Secondary | ICD-10-CM | POA: Insufficient documentation

## 2014-12-25 DIAGNOSIS — G8929 Other chronic pain: Secondary | ICD-10-CM | POA: Insufficient documentation

## 2014-12-25 DIAGNOSIS — T1491XA Suicide attempt, initial encounter: Secondary | ICD-10-CM

## 2014-12-25 DIAGNOSIS — R45851 Suicidal ideations: Secondary | ICD-10-CM | POA: Diagnosis present

## 2014-12-25 DIAGNOSIS — I252 Old myocardial infarction: Secondary | ICD-10-CM | POA: Insufficient documentation

## 2014-12-25 DIAGNOSIS — Z8719 Personal history of other diseases of the digestive system: Secondary | ICD-10-CM | POA: Insufficient documentation

## 2014-12-25 DIAGNOSIS — Z79899 Other long term (current) drug therapy: Secondary | ICD-10-CM | POA: Insufficient documentation

## 2014-12-25 DIAGNOSIS — F1310 Sedative, hypnotic or anxiolytic abuse, uncomplicated: Secondary | ICD-10-CM | POA: Diagnosis not present

## 2014-12-25 DIAGNOSIS — Z87891 Personal history of nicotine dependence: Secondary | ICD-10-CM | POA: Diagnosis not present

## 2014-12-25 DIAGNOSIS — G40909 Epilepsy, unspecified, not intractable, without status epilepticus: Secondary | ICD-10-CM | POA: Diagnosis not present

## 2014-12-25 LAB — CBC WITH DIFFERENTIAL/PLATELET
Basophils Absolute: 0 10*3/uL (ref 0.0–0.1)
Basophils Relative: 0 % (ref 0–1)
Eosinophils Absolute: 0.1 10*3/uL (ref 0.0–0.7)
Eosinophils Relative: 1 % (ref 0–5)
HCT: 42.3 % (ref 39.0–52.0)
Hemoglobin: 14.3 g/dL (ref 13.0–17.0)
Lymphocytes Relative: 25 % (ref 12–46)
Lymphs Abs: 1.9 10*3/uL (ref 0.7–4.0)
MCH: 29.5 pg (ref 26.0–34.0)
MCHC: 33.8 g/dL (ref 30.0–36.0)
MCV: 87.2 fL (ref 78.0–100.0)
Monocytes Absolute: 0.6 10*3/uL (ref 0.1–1.0)
Monocytes Relative: 8 % (ref 3–12)
Neutro Abs: 4.8 10*3/uL (ref 1.7–7.7)
Neutrophils Relative %: 66 % (ref 43–77)
Platelets: 254 10*3/uL (ref 150–400)
RBC: 4.85 MIL/uL (ref 4.22–5.81)
RDW: 14.2 % (ref 11.5–15.5)
WBC: 7.4 10*3/uL (ref 4.0–10.5)

## 2014-12-25 NOTE — ED Provider Notes (Signed)
CSN: [id number]     Arrival date & time 12/25/14  2314 History   First MD Initiated Contact with Patient 12/25/14 2330     Chief Complaint  Patient presents with  . Suicidal     (Consider location/radiation/quality/duration/timing/severity/associated sxs/prior Treatment) The history is provided by the patient. No language interpreter was used.  Mr. Scott [name] is a 44 y.o male with a history of alcohol abuse, bipolar disorder, MI, anxiety, depression, GERD, and seizures who presents for attempted suicide earlier today when he was sitting in a running vehicle in the garage with a pipe from the exhaust into the car.  He says his wife caught him when he was about to pass out. 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  He is currently on methadone and sees a psychologist. He recently relapsed after 8 months of no drug use.  He states he has been battling suicidal thoughts for a long time now and came to the hospital 1 week ago but did not want to be admitted at that time.  His only complaint is chronic back pain which he states is what started his addiction. He denies any fever, chills, headache, chest pain, shortness of breath, abdominal pain, nausea, vomiting, or urinary symptoms.  Past Medical History  Diagnosis Date  . Back pain   . Seizures   . History of ETOH abuse   . Bipolar 1 disorder   . Myocardial infarction   . Anxiety   . Depression   . GERD (gastroesophageal reflux disease)   . Chronic back pain    Past Surgical History  Procedure Laterality Date  . Back surgery    . Appendectomy    . Abdominal surgery     Family History  Problem Relation Age of Onset  . Hypertension Mother   . CAD Father   . COPD Father   . Stroke Father   . Testicular cancer Brother    History  Substance Use Topics  . Smoking status: Former Smoker -- 0.50  packs/day    Types: Cigarettes  . Smokeless tobacco: Never Used  . Alcohol Use: No     Comment: last drink over 1 year ago    Review of Systems  All other systems reviewed and are negative.     Allergies  Benadryl; Celecoxib; Prednisone; Risperidone and related; Rofecoxib; and Nsaids  Home Medications   Prior to Admission medications   Medication Sig Start Date End Date Taking? Authorizing Provider  busPIRone (BUSPAR) 15 MG tablet Take 15 mg by mouth 3 (three) times daily.   Yes Historical Provider, MD  citalopram (CELEXA) 20 MG tablet Take 20 mg by mouth daily.   Yes Historical Provider, MD  gabapentin (NEURONTIN) 400 MG capsule Take 800 mg by mouth 3 (three) times daily.   Yes Historical Provider, MD  methadone (DOLOPHINE) 10 MG/ML solution Take 99 mg by mouth daily.   Yes Historical Provider, MD  OXcarbazepine (TRILEPTAL) 150 MG tablet Take 150 mg by mouth 3 (three) times daily.    Yes Historical Provider, MD  Potassium Gluconate 595 MG CAPS Take 1 capsule by mouth daily.   Yes Historical Provider, MD  docusate sodium (COLACE) 100 MG capsule Take 100 mg by mouth 2 (two) times daily as needed for mild constipation.     Historical Provider, MD  HYDROcodone-acetaminophen (NORCO/VICODIN) 5-325 MG  per tablet Take 1-2 tablets by mouth every 4 (four) hours as needed for moderate pain or severe pain. Patient not taking: Reported on 12/12/2014 09/02/14   Oswaldo ConroyVictoria Creech, PA-C  oxyCODONE-acetaminophen (PERCOCET/ROXICET) 5-325 MG per tablet Take 1 tablet by mouth every 6 (six) hours as needed for moderate pain. Patient not taking: Reported on 12/12/2014 11/09/14   Elwin MochaBlair Walden, MD  risperiDONE (RISPERDAL) 2 MG tablet Take 2 mg by mouth at bedtime.    Historical Provider, MD  SUMAtriptan (IMITREX) 100 MG tablet Take 1 tablet (100 mg total) by mouth every 2 (two) hours as needed for migraine or headache. May repeat in 2 hours if headache persists or recurs. Patient not taking: Reported on 12/12/2014  01/26/14   Elson AreasLeslie K Sofia, PA-C   BP 140/83 mmHg  Pulse 70  Temp(Src) 98.4 F (36.9 C) (Oral)  Resp 18  Ht 6' (1.829 m)  Wt 299 lb (135.626 kg)  BMI 40.54 kg/m2  SpO2 96% Physical Exam  Constitutional: He is oriented to person, place, and time. He appears well-developed and well-nourished.  HENT:  Head: Normocephalic and atraumatic.  Eyes: Conjunctivae are normal.  Neck: Normal range of motion. Neck supple.  Cardiovascular: Normal rate, regular rhythm and normal heart sounds.   Pulmonary/Chest: Effort normal and breath sounds normal. No respiratory distress. He has no wheezes. He has no rales.  Abdominal: Soft. There is no tenderness.  Musculoskeletal: Normal range of motion. He exhibits no edema.  Neurological: He is alert and oriented to person, place, and time.  Skin: Skin is warm and dry.  Psychiatric: He has a normal mood and affect. His speech is normal.  Nursing note and vitals reviewed.   ED Course  Procedures (including critical care time) Labs Review Labs Reviewed  COMPREHENSIVE METABOLIC PANEL - Abnormal; Notable for the following:    Glucose, Bld 116 (*)    All other components within normal limits  ETHANOL  CBC WITH DIFFERENTIAL/PLATELET  URINE RAPID DRUG SCREEN (HOSP PERFORMED)  CARBOXYHEMOGLOBIN    Imaging Review No results found.   EKG Interpretation None      MDM   Final diagnoses:  Suicide attempt  Patient presents for suicide attempt by carbon monoxide poisoning earlier today.  He does not appear hypoxic or short of breath.  His vitals are stable and he is mentating appropriately. 96% on room air.   Shift ended. Care signed out to ED physician over night.  The plan is that he will need admission with psych consult. He has a Comptrollersitter who is at bedside.   His CBC, CMP, and ethanol are normal.  His carboxyhemoglobin is still pending.  He has no complains except his chronic back pain.       Catha GosselinHanna Patel-Mills, PA-C 12/26/14 [id number]  Eber HongBrian  Miller, MD 12/26/14 [id number]

## 2014-12-25 NOTE — ED Notes (Signed)
Pt. reports feeling depressed with suicidal ideation - plans to inhale auto fumes inside a garage , denies hallucinations or delusions .

## 2014-12-25 NOTE — ED Notes (Signed)
Staffing office notified for sitter , purple scrubs given to pt. , security notified to wand pt.

## 2014-12-26 ENCOUNTER — Inpatient Hospital Stay (HOSPITAL_COMMUNITY)
Admission: AD | Admit: 2014-12-26 | Discharge: 2015-01-02 | DRG: 882 | Disposition: A | Discharge status: Home / Self Care | Payer: Medicare Other | Source: Intra-hospital | Attending: Psychiatry | Admitting: Psychiatry

## 2014-12-26 ENCOUNTER — Encounter (HOSPITAL_COMMUNITY): Payer: Self-pay | Admitting: *Deleted

## 2014-12-26 DIAGNOSIS — I252 Old myocardial infarction: Secondary | ICD-10-CM

## 2014-12-26 DIAGNOSIS — F1410 Cocaine abuse, uncomplicated: Secondary | ICD-10-CM | POA: Diagnosis not present

## 2014-12-26 DIAGNOSIS — F4310 Post-traumatic stress disorder, unspecified: Secondary | ICD-10-CM | POA: Diagnosis present

## 2014-12-26 DIAGNOSIS — F329 Major depressive disorder, single episode, unspecified: Secondary | ICD-10-CM | POA: Diagnosis not present

## 2014-12-26 DIAGNOSIS — F1414 Cocaine abuse with cocaine-induced mood disorder: Secondary | ICD-10-CM | POA: Diagnosis present

## 2014-12-26 DIAGNOSIS — F1120 Opioid dependence, uncomplicated: Secondary | ICD-10-CM | POA: Diagnosis present

## 2014-12-26 DIAGNOSIS — F17210 Nicotine dependence, cigarettes, uncomplicated: Secondary | ICD-10-CM | POA: Diagnosis present

## 2014-12-26 DIAGNOSIS — R45851 Suicidal ideations: Secondary | ICD-10-CM | POA: Diagnosis present

## 2014-12-26 DIAGNOSIS — K5900 Constipation, unspecified: Secondary | ICD-10-CM | POA: Diagnosis present

## 2014-12-26 DIAGNOSIS — M7989 Other specified soft tissue disorders: Secondary | ICD-10-CM | POA: Diagnosis present

## 2014-12-26 DIAGNOSIS — Z91411 Personal history of adult psychological abuse: Secondary | ICD-10-CM | POA: Diagnosis not present

## 2014-12-26 DIAGNOSIS — F322 Major depressive disorder, single episode, severe without psychotic features: Secondary | ICD-10-CM | POA: Diagnosis present

## 2014-12-26 DIAGNOSIS — Z91410 Personal history of adult physical and sexual abuse: Secondary | ICD-10-CM | POA: Diagnosis not present

## 2014-12-26 DIAGNOSIS — F331 Major depressive disorder, recurrent, moderate: Secondary | ICD-10-CM | POA: Diagnosis not present

## 2014-12-26 DIAGNOSIS — F319 Bipolar disorder, unspecified: Secondary | ICD-10-CM | POA: Diagnosis present

## 2014-12-26 LAB — COMPREHENSIVE METABOLIC PANEL
ALT: 51 U/L (ref 17–63)
AST: 35 U/L (ref 15–41)
Albumin: 3.8 g/dL (ref 3.5–5.0)
Alkaline Phosphatase: 67 U/L (ref 38–126)
Anion gap: 10 (ref 5–15)
BUN: 12 mg/dL (ref 6–20)
CO2: 27 mmol/L (ref 22–32)
Calcium: 9.2 mg/dL (ref 8.9–10.3)
Chloride: 103 mmol/L (ref 101–111)
Creatinine, Ser: 0.97 mg/dL (ref 0.61–1.24)
GFR calc Af Amer: >60 mL/min (ref >60)
GFR calc non Af Amer: >60 mL/min (ref >60)
Glucose, Bld: 116 mg/dL — ABNORMAL HIGH (ref 65–99)
Potassium: 3.5 mmol/L (ref 3.5–5.1)
Sodium: 140 mmol/L (ref 135–145)
Total Bilirubin: 0.5 mg/dL (ref 0.3–1.2)
Total Protein: 7 g/dL (ref 6.5–8.1)

## 2014-12-26 LAB — CARBOXYHEMOGLOBIN
Carboxyhemoglobin: 5 % (ref 0.5–1.5)
Carboxyhemoglobin: 2.6 % — ABNORMAL HIGH (ref 0.5–1.5)
Methemoglobin: 1.1 % (ref 0.0–1.5)
Methemoglobin: 0.7 % (ref 0.0–1.5)
O2 Saturation: 98.2 %
O2 Saturation: 99.4 %
Total hemoglobin: 13.8 g/dL (ref 13.5–18.0)
Total hemoglobin: 14 g/dL (ref 13.5–18.0)

## 2014-12-26 LAB — RAPID URINE DRUG SCREEN, HOSP PERFORMED
Amphetamines: NOT DETECTED
Barbiturates: NOT DETECTED
Benzodiazepines: POSITIVE — AB
Cocaine: POSITIVE — AB
Opiates: NOT DETECTED
Tetrahydrocannabinol: NOT DETECTED

## 2014-12-26 LAB — ACETAMINOPHEN LEVEL: Acetaminophen (Tylenol), Serum: <10 ug/mL — ABNORMAL LOW (ref 10–30)

## 2014-12-26 LAB — SALICYLATE LEVEL: Salicylate Lvl: <4 mg/dL (ref 2.8–30.0)

## 2014-12-26 LAB — ETHANOL: Alcohol, Ethyl (B): <5 mg/dL (ref <5)

## 2014-12-26 MED ORDER — CITALOPRAM HYDROBROMIDE 20 MG PO TABS
20.0000 mg | ORAL_TABLET | Freq: Every day | ORAL | Status: DC
  Filled 2014-12-26 (×2): qty 1

## 2014-12-26 MED ORDER — OXCARBAZEPINE 150 MG PO TABS
150.0000 mg | ORAL_TABLET | Freq: Three times a day (TID) | ORAL | Status: DC
  Administered 2014-12-26 – 2015-01-02 (×20): 150 mg via ORAL
  Filled 2014-12-26 (×25): qty 1

## 2014-12-26 MED ORDER — TRAZODONE HCL 100 MG PO TABS
100.0000 mg | ORAL_TABLET | Freq: Every day | ORAL | Status: DC
  Administered 2014-12-26 – 2014-12-29 (×4): 100 mg via ORAL
  Filled 2014-12-26 (×7): qty 1

## 2014-12-26 MED ORDER — ALUM & MAG HYDROXIDE-SIMETH 200-200-20 MG/5ML PO SUSP
30.0000 mL | ORAL | Status: DC | PRN
  Administered 2014-12-29 – 2014-12-31 (×3): 30 mL via ORAL
  Filled 2014-12-26 (×3): qty 30

## 2014-12-26 MED ORDER — GABAPENTIN 400 MG PO CAPS
800.0000 mg | ORAL_CAPSULE | Freq: Three times a day (TID) | ORAL | Status: DC
  Administered 2014-12-26 – 2015-01-02 (×21): 800 mg via ORAL
  Filled 2014-12-26 (×26): qty 2

## 2014-12-26 MED ORDER — ACETAMINOPHEN 325 MG PO TABS
650.0000 mg | ORAL_TABLET | Freq: Four times a day (QID) | ORAL | Status: DC | PRN
  Administered 2014-12-30: 650 mg via ORAL
  Filled 2014-12-26: qty 2

## 2014-12-26 MED ORDER — NICOTINE 21 MG/24HR TD PT24
21.0000 mg | MEDICATED_PATCH | Freq: Every day | TRANSDERMAL | Status: DC
  Administered 2014-12-27 – 2015-01-02 (×7): 21 mg via TRANSDERMAL
  Filled 2014-12-26 (×9): qty 1

## 2014-12-26 MED ORDER — CITALOPRAM HYDROBROMIDE 20 MG PO TABS
30.0000 mg | ORAL_TABLET | Freq: Every day | ORAL | Status: DC
  Administered 2014-12-26 – 2014-12-27 (×2): 30 mg via ORAL
  Filled 2014-12-26 (×5): qty 1

## 2014-12-26 MED ORDER — METHADONE HCL 10 MG PO TABS
100.0000 mg | ORAL_TABLET | Freq: Every day | ORAL | Status: DC
  Administered 2014-12-26 – 2015-01-02 (×8): 100 mg via ORAL
  Filled 2014-12-26 (×8): qty 10

## 2014-12-26 MED ORDER — HYDROCODONE-ACETAMINOPHEN 5-325 MG PO TABS
2.0000 | ORAL_TABLET | Freq: Once | ORAL | Status: AC
  Administered 2014-12-26: 2 via ORAL
  Filled 2014-12-26: qty 2

## 2014-12-26 MED ORDER — MAGNESIUM HYDROXIDE 400 MG/5ML PO SUSP: 30.0000 mL | Freq: Every day | ORAL | Status: DC | PRN

## 2014-12-26 MED ORDER — DOCUSATE SODIUM 100 MG PO CAPS
100.0000 mg | ORAL_CAPSULE | Freq: Two times a day (BID) | ORAL | Status: DC | PRN
  Administered 2014-12-28 (×2): 100 mg via ORAL
  Filled 2014-12-26 (×2): qty 1

## 2014-12-26 NOTE — ED Provider Notes (Signed)
Informed pt had elevated CO level He is awake/alert He has mild HA but no other complaints He denies CP/SOB He was placed on oxygen EKG performed   EKG Interpretation  Date/Time:  Thursday Dec 26 2014 06:31:47 EDT Ventricular Rate:  50 PR Interval:  158 QRS Duration: 114 QT Interval:  524 QTC Calculation: 477 R Axis:   31 Text Interpretation:  Sinus bradycardia Low voltage QRS Cannot rule out Anterior infarct , age undetermined Abnormal ECG No significant change since last tracing Confirmed by Bebe ShaggyWICKLINE  MD, Victoria Henshaw ([id number]) on 12/26/2014 6:37:05 AM      Per poison center will need monitoring for 4 hrs and recheck CO level I also added on ASA/APAP levels Pt currently stable Signed out to dr Littie Deedsgentry to f/u on labs   Zadie Rhineonald Dalaya Suppa, MD 12/26/14 [phone]

## 2014-12-26 NOTE — ED Notes (Signed)
[place], Pharm Tech left a message at [facility] [place] to verify Methadone dose. Will follow up.
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
## 2014-12-26 NOTE — ED Provider Notes (Signed)
Repeat COOHb 2.6. I believe pt medically clear, spoke w/ poison control.  [facility], Dr. Dub MikesLugo.   1. Suicide attempt      Toy CookeyMegan Docherty, MD 12/26/14 2046

## 2014-12-26 NOTE — ED Notes (Signed)
Pt moved from room C20 to C 23 to be placed on the monitor, and NRB O2 mask---

## 2014-12-26 NOTE — Progress Notes (Signed)
Patient ID: Scott [name], male   DOB: May 22, 1971, 44 y.o.   MRN: [id number] PER STATE REGULATIONS 482.30  THIS CHART WAS REVIEWED FOR MEDICAL NECESSITY WITH RESPECT TO THE PATIENT'S ADMISSION/DURATION OF STAY.  NEXT REVIEW DATE: 12/30/14  Loura HaltBARBARA Ernesto Lashway, RN, BSN CASE MANAGER

## 2014-12-26 NOTE — BH Assessment (Signed)
Reviewed ED notes prior to assessment. Pt was assessed by this writer on 12-12-14 and reported depressive d/o, SA, and and PTSD.   Pt reports to ED today reporting SI.   Requested cart be placed with pt for assessment. Irving Burtonmily requests about 10 minutes to find and place cart. Will attempt assessment at 0035.  Clista BernhardtNancy Paolina Karwowski, [facility] Triage Specialist 12/26/2014 12:27 AM

## 2014-12-26 NOTE — ED Notes (Addendum)
Poison Control called order to have pt on high flow O2, for 4 hours, get a EKG and resent VSS.
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
## 2014-12-26 NOTE — ED Notes (Signed)
Pharm Tech will verify Methadone dose and inform ED staff-- other meds can be restarted once verified, per [facility]erv[facility] Are[facility]

## 2014-12-26 NOTE — ED Notes (Signed)
Carollee HerterShannon, RN returned call from [facility]-- pt is on Methadone 99mg , last dosed yesterday.

## 2014-12-26 NOTE — [facility] Suicide Risk Assessment (Signed)
[facility] INPATIENT:  Family/Significant Other Suicide Prevention Education  Suicide Prevention Education:  Education Completed; wife Rayetta HumphreyKatherine Offield [phone],  (name of family member/significant other) has been identified by the patient as the family member/significant other with whom the patient will be residing, and identified as the person(s) who will aid the patient in the event of a mental health crisis (suicidal ideations/suicide attempt).  With written consent from the patient, the family member/significant other has been provided the following suicide prevention education, prior to the and/or following the discharge of the patient.  The suicide prevention education provided includes the following:  Suicide risk factors  Suicide prevention and interventions  National Suicide Hotline telephone number  [facility] assessment telephone number  [facility] Emergency Assistance 911  [facility] and/or Residential Mobile Crisis Unit telephone number  Request made of family/significant other to:  Remove weapons (e.g., guns, rifles, knives), all items previously/currently identified as safety concern.    Remove drugs/medications (over-the-counter, prescriptions, illicit drugs), all items previously/currently identified as a safety concern.  The family member/significant other verbalizes understanding of the suicide prevention education information provided.  The family member/significant other agrees to remove the items of safety concern listed above.  Cletis Clack, [place] CarboKristin L 12/26/2014, 5:02 PM

## 2014-12-26 NOTE — ED Provider Notes (Signed)
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 He was found by his wife, she had to wake him up, he tried to talk her out of bringing him to the hospital but now is here on his own volition requesting assistance. He denies any physical complaints and on exam other than having a slightly depressed affect he does not see a doctor, clear heart and lung sounds and a normal neurologic exam.  Pt seen and assessed by TTS - they will recommend admission in the AM - will d/w Psych in the AM due to methadone use - but expect to transfer to psych in AM after notification from TTS.  They also recommended IVC as pt has left in past and has alternate identities which he even admits are manipulative.  IVC completed.  Medical screening examination/treatment/procedure(s) were conducted as a shared visit with non-physician practitioner(s) and myself.  I personally evaluated the patient during the encounter.  Clinical Impression:   Final diagnoses:  Suicide attempt         Eber HongBrian Leo Fray, MD 12/26/14 [phone]

## 2014-12-26 NOTE — Progress Notes (Signed)
Per Tanna SavoyEric, AC, [facility] Dr. Dub MikesLugo.   Minerva Areolaric spoke with [facility] re: pt's placement.   Ilean SkillMeghan Yvonne Stopher, MSW, LCSWA Clinical Social Work, Disposition  12/26/2014 [phone]

## 2014-12-26 NOTE — ED Notes (Signed)
[facility] with Minerva AreolaEric, [facility]-- will call results of carboxyhemoglobin to him, if medically clear, [facility].

## 2014-12-26 NOTE — Progress Notes (Addendum)
Patient ID: Scott [name] Nilsen, male   DOB: 02/23/1971, 44 y.o.   MRN: [id number] D: Client visible in day room sitting up sleep, reports "tired, didn't get much sleep last night" "I was in hospital for few days, I tried to take my life, hooked a pipe to the tail pipe of my car, tired to commit with carbon monoxide poisoning." Client reports depression as "10" of 10 and anxiety as "4" of 10. A: Writer provided emotional support, encouraged client to report any concerns, medications reviewed, administered as prescribed. Staff will monitor q215min for safety. R: Client is safe on the unit, attended karaoke.

## 2014-12-26 NOTE — Progress Notes (Signed)
Patient ID: Scott [name], male   DOB: November 22, 1970, 44 y.o.   MRN: [id number] Patient admitted to adult unit for depression, substance abuse and suicide attempt.  His wife brought him to the ED after 4 hours of negotiation following a significant suicide attempt. Pt reports he ran a hose from his tailpipe to his car window and attempted to kill himself via carbon monoxide poisoning. Pt reports he had been considering this plan for about two days. Pt reports he has hx of SA, depression, and DID and feels like a burden to his family. Patient has a depressed mood and constricted affect. Pt has SI with serious attempt tonight. He denies HI, AVH, or self harm. He reports hx of SA and reports he has been dissociating, and that his alter uses cocaine. Patients alters name is Scott [name]. Patient remained focused during interview and no shifting into the alter was noted. Pt reports he feels hopeless, and overwhelmed with his depression. Patient has been on methadone through [facility]. Patients vitals are stable. No symptoms of withdrawal COWS 1. Patient denies ETOH use. Patient also has some OCD symptoms. Patient reported he started using Cocaine again after having EMDR treatment. Patient contracts for safety. 15 minute checks for safety. Patient oriented to unit.
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
## 2014-12-26 NOTE — ED Notes (Signed)
Pt ate lunch provided, escorted out by [facility] officers.  Belongings given to officer.

## 2014-12-26 NOTE — ED Notes (Addendum)
[facility]. Report called to Selena BattenKim, RN

## 2014-12-26 NOTE — Progress Notes (Signed)
RN notified of panic carboxyhemoglobin results 5.0.

## 2014-12-27 MED ORDER — METHOCARBAMOL 500 MG PO TABS
500.0000 mg | ORAL_TABLET | Freq: Two times a day (BID) | ORAL | Status: DC
  Administered 2014-12-27 – 2015-01-02 (×12): 500 mg via ORAL
  Filled 2014-12-27 (×15): qty 1

## 2014-12-27 MED ORDER — DULOXETINE HCL 20 MG PO CPEP
20.0000 mg | ORAL_CAPSULE | Freq: Every morning | ORAL | Status: DC
  Administered 2014-12-27 – 2014-12-28 (×2): 20 mg via ORAL
  Filled 2014-12-27 (×5): qty 1
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
## 2014-12-27 NOTE — [facility] Group Notes (Signed)
[facility] LCSW Group Therapy 12/27/2014 1:15 PM Type of Therapy: Group Therapy Participation Level: Minimal  Participation Quality: Minimal  Affect: Depressed and Flat  Cognitive: Alert and Oriented  Insight: Developing/Improving and Engaged  Engagement in Therapy: Developing/Improving and Engaged  Modes of Intervention: Clarification, Confrontation, Discussion, Education, Exploration, Limit-setting, Orientation, Problem-solving, Rapport Building, Dance movement psychotherapisteality Testing, Socialization and Support  Summary of Progress/Problems: The topic for today was feelings about relapse. Pt discussed what relapse prevention is to them and identified triggers that they are on the path to relapse. Pt processed their feeling towards relapse and was able to relate to peers. Pt discussed coping skills that can be used for relapse prevention. Patient was observed sleeping through group and participated minimally in discussion.   Scott [name], MSW, [employer]CSWA Clinical Social Worker [facility] [phone]

## 2014-12-27 NOTE — Tx Team (Signed)
Interdisciplinary Treatment Plan Update (Adult) Date: 12/27/2014   Time Reviewed: 9:30 AM  Progress in Treatment: Attending groups: Continuing to assess, patient new to milieu Participating in groups: Yes Taking medication as prescribed: Yes Tolerating medication: Yes Family/Significant other contact made: Yes, CSW has spoken with patient's wife Patient understands diagnosis: Yes Discussing patient identified problems/goals with staff: Yes Medical problems stabilized or resolved: Yes Denies suicidal/homicidal ideation: Treatment team continuing to assess Issues/concerns per patient self-inventory: Yes Other:  New problem(s) identified: N/A  Discharge Plan or Barriers:  5/20: Patient plans to return home to follow up with [facility] for outpatient treatment. His goal is to taper off of methadone and switch to another provider eventually.   Reason for Continuation of Hospitalization:  Depression Anxiety Medication Stabilization   Comments: N/A  Estimated length of stay: 3-5 days  For review of initial/current patient goals, please see plan of care.  Patient is a 44 year old Caucasian Male admitted for suicide attempt by carbon monoxide poisoning. Patient reports multiple previous attempts. He reports a decline in his functioning during the past 8 months after exploring EMDR therapy with his therapist at [facility]. 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 He is currently on a methadone maintenance program at [facility]. [place] and 3 children. He plans to return at discharge and continue services at [facility]. Patient will benefit from crisis stabilization, medication evaluation, group therapy, and psycho education in addition to case management for discharge planning. Patient and CSW reviewed pt's identified goals and treatment plan. Pt verbalized understanding and agreed to treatment plan.   Attendees: Patient:  12/27/2014 8:30 AM   Family:   12/27/2014 8:30 AM   Physician: Dr. Geoffery LyonsIrving Lugo, MD 12/27/2014 8:30 AM   Nursing: Rodman KeyJanet Webb, Quintella ReichertBeverly Knight RN 12/27/2014 8:30 AM   Clinical Serena ColonelAggie Nwoko, NP 12/27/2014 8:30 AM   Clinical Social Worker: Belenda CruiseKristin Coltyn Hanning LCSWA 12/27/2014 8:30 AM   Other: Onnie BoerJennifer Clark, Nurse Case Manager 12/27/2014 8:30 AM   Other: Leisa LenzValerie Enoch; [facility]  12/27/2014 8:30 AM   Other:  12/27/2014 8:30 AM   Other:  12/27/2014 8:30 AM         Scribe for Treatment Team:  Samuella BruinKristin Roselina Burgueno, MSW, LCSWA [phone]

## 2014-12-27 NOTE — Tx Team (Signed)
Initial Interdisciplinary Treatment Plan   PATIENT STRESSORS: Medication change or noncompliance Substance abuse   PATIENT STRENGTHS: Communication skills General fund of knowledge Supportive family/friends   PROBLEM LIST: Problem List/Patient Goals Date to be addressed Date deferred Reason deferred Estimated date of resolution  "I tried to kill myself" 12-27-14     Depression 12-27-14       Anxiety 12-27-14       Substance Abuse 12-27-14       Alter ego 12-27-14                                DISCHARGE CRITERIA:  Improved stabilization in mood, thinking, and/or behavior Need for constant or close observation no longer present Reduction of life-threatening or endangering symptoms to within safe limits Verbal commitment to aftercare and medication compliance Withdrawal symptoms are absent or subacute and managed without 24-hour nursing intervention  PRELIMINARY DISCHARGE PLAN: Attend aftercare/continuing care group Attend PHP/IOP Attend 12-step recovery group Outpatient therapy Participate in family therapy Return to previous living arrangement  PATIENT/FAMIILY INVOLVEMENT: This treatment plan has been presented to and reviewed with the patient, Scott [name], and/or family member.  The patient and family have been given the opportunity to ask questions and make suggestions.  Mickeal NeedyJohnson, Scott [name] 12/27/2014, 5:03 AM

## 2014-12-27 NOTE — Progress Notes (Signed)
[facility] MD Progress Note  12/27/2014 4:34 PM Scott [name]  MRN:  [id number] Subjective:  Scott [name] states he wants to be in a place he can stay long term. 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 States he suffers from a heart condition and he knows cocaine can hurt him and yet he "wakes up" to find he has used cocaine. States he did not have any of such issues but after he started the EMDR Principal Problem: PTSD (post-traumatic stress disorder) Diagnosis:   Patient Active Problem List   Diagnosis Date Noted  . MDD (major depressive disorder) [F32.2] 12/26/2014  . PTSD (post-traumatic stress disorder) [F43.10] 12/26/2014  . Opioid type dependence, continuous [F11.20] 12/26/2014  . Cocaine abuse with cocaine-induced mood disorder [F14.14] 12/26/2014  . Major depressive disorder, recurrent episode, moderate [F33.1]   . Chest pain at rest [R07.9] 10/22/2013  . 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 Chronic pain syndrome [G89.4] 10/22/2013  . Seizure disorder [G40.909] 10/22/2013  . Bipolar disorder, unspecified [F31.9] 10/22/2013  . Withdrawal symptoms, drug or narcotic [F19.939] 10/22/2013  . Chest pain [R07.9] 10/21/2013  . Leg swelling [M79.89] 10/21/2013   Total Time spent with patient: 30 minutes   Past Medical History:  Past Medical History  Diagnosis Date  . Back pain   . Seizures   . History of ETOH abuse   . Bipolar 1 disorder   . Myocardial infarction   . Anxiety   . Depression   . GERD (gastroesophageal reflux disease)   . Chronic back pain     Past Surgical History  Procedure Laterality Date  . Back surgery    . Appendectomy    . Abdominal surgery     Family History:  Family History  Problem Relation Age of Onset  . Hypertension Mother   . CAD Father   . COPD Father   . Stroke Father   . Testicular cancer  Brother    Social History:  History  Alcohol Use No    Comment: last drink over 1 year ago     History  Drug Use  . Yes  . Special: Cocaine    History   Social History  . Marital Status: Married    Spouse Name: N/A  . Number of Children: N/A  . Years of Education: N/A   Social History Main Topics  . Smoking status: Heavy Tobacco Smoker -- 1.50 packs/day    Types: Cigarettes  . Smokeless tobacco: Never Used  . Alcohol Use: No     Comment: last drink over 1 year ago  . Drug Use: Yes    Special: Cocaine  . Sexual Activity: Yes   Other Topics Concern  . None   Social History Narrative   Additional History:    Sleep: Fair  Appetite:  Fair   Assessment:   Musculoskeletal: Strength & Muscle Tone: within normal limits Gait & Station: normal Patient leans: N/A   Psychiatric Specialty Exam: Physical Exam  Review of Systems  Constitutional: Negative.   HENT: Negative.   Eyes: Negative.   Respiratory: Negative.   Cardiovascular: Negative.   Gastrointestinal: Negative.   Genitourinary: Negative.   Musculoskeletal: Negative.   Skin: Negative.   Neurological: Negative.   Endo/Heme/Allergies: Negative.   Psychiatric/Behavioral: Positive for depression and substance abuse. The patient is nervous/anxious.  Blood pressure 129/68, pulse 63, temperature 97.8 F (36.6 C), temperature source Oral, resp. rate 18, height 6' (1.829 m), weight 135.626 kg (299 lb), SpO2 100 %.Body mass index is 40.54 kg/(m^2).  General Appearance: Fairly Groomed  Engineer, water::  Fair  Speech:  Clear and Coherent  Volume:  Normal  Mood:  Anxious and worried  Affect:  anxious worried  Thought Process:  Coherent and Goal Directed  Orientation:  Full (Time, Place, and Person)  Thought Content:  symptoms events worries concerns  Suicidal Thoughts:  No  Homicidal Thoughts:  No  Memory:  Immediate;   Fair Recent;   Fair Remote;   Fair  Judgement:  Fair  Insight:  Present and Shallow   Psychomotor Activity:  Restlessness  Concentration:  Fair  Recall:  [employer] of Knowledge:Fair  Language: Fair  Akathisia:  No  Handed:  Right  AIMS (if indicated):     Assets:  Desire for Improvement  ADL's:  Intact  Cognition: WNL  Sleep:  Number of Hours: 6.5     Current Medications: Current Facility-Administered Medications  Medication Dose Route Frequency Provider Last Rate Last Dose  . acetaminophen (TYLENOL) tablet 650 mg  650 mg Oral Q6H PRN Shuvon B Rankin, NP      . alum & mag hydroxide-simeth (MAALOX/MYLANTA) 200-200-20 MG/5ML suspension 30 mL  30 mL Oral Q4H PRN Shuvon B Rankin, NP      . docusate sodium (COLACE) capsule 100 mg  100 mg Oral BID PRN Shuvon B Rankin, NP      . DULoxetine (CYMBALTA) DR capsule 20 mg  20 mg Oral q morning - 10a Nicholaus Bloom, MD   20 mg at 12/27/14 1221  . gabapentin (NEURONTIN) capsule 800 mg  800 mg Oral TID Nicholaus Bloom, MD   800 mg at 12/27/14 0827  . magnesium hydroxide (MILK OF MAGNESIA) suspension 30 mL  30 mL Oral Daily PRN Shuvon B Rankin, NP      . methadone (DOLOPHINE) tablet 100 mg  100 mg Oral Daily Nicholaus Bloom, MD   100 mg at 12/27/14 0830  . methocarbamol (ROBAXIN) tablet 500 mg  500 mg Oral BID Nicholaus Bloom, MD      . nicotine (NICODERM CQ - dosed in mg/24 hours) patch 21 mg  21 mg Transdermal Daily Nicholaus Bloom, MD   21 mg at 12/27/14 7371  . OXcarbazepine (TRILEPTAL) tablet 150 mg  150 mg Oral TID Shuvon B Rankin, NP   150 mg at 12/27/14 1221  . traZODone (DESYREL) tablet 100 mg  100 mg Oral QHS Nicholaus Bloom, MD   100 mg at 12/26/14 2147    Lab Results:  Results for orders placed or performed during the hospital encounter of 12/25/14 (from the past 48 hour(s))  Ethanol     Status: None   Collection Time: 12/25/14 11:27 PM  Result Value Ref Range   Alcohol, Ethyl (B) <5 <5 mg/dL    Comment:        LOWEST DETECTABLE LIMIT FOR SERUM ALCOHOL IS 11 mg/dL FOR MEDICAL PURPOSES ONLY   CBC with Differential      Status: None   Collection Time: 12/25/14 11:27 PM  Result Value Ref Range   WBC 7.4 4.0 - 10.5 K/uL   RBC 4.85 4.22 - 5.81 MIL/uL   Hemoglobin 14.3 13.0 - 17.0 g/dL   HCT 42.3 39.0 - 52.0 %   MCV 87.2 78.0 - 100.0 fL  MCH 29.5 26.0 - 34.0 pg   MCHC 33.8 30.0 - 36.0 g/dL   RDW 14.2 11.5 - 15.5 %   Platelets 254 150 - 400 K/uL   Neutrophils Relative % 66 43 - 77 %   Neutro Abs 4.8 1.7 - 7.7 K/uL   Lymphocytes Relative 25 12 - 46 %   Lymphs Abs 1.9 0.7 - 4.0 K/uL   Monocytes Relative 8 3 - 12 %   Monocytes Absolute 0.6 0.1 - 1.0 K/uL   Eosinophils Relative 1 0 - 5 %   Eosinophils Absolute 0.1 0.0 - 0.7 K/uL   Basophils Relative 0 0 - 1 %   Basophils Absolute 0.0 0.0 - 0.1 K/uL  Comprehensive metabolic panel     Status: Abnormal   Collection Time: 12/25/14 11:27 PM  Result Value Ref Range   Sodium 140 135 - 145 mmol/L   Potassium 3.5 3.5 - 5.1 mmol/L   Chloride 103 101 - 111 mmol/L   CO2 27 22 - 32 mmol/L   Glucose, Bld 116 (H) 65 - 99 mg/dL   BUN 12 6 - 20 mg/dL   Creatinine, Ser 0.97 0.61 - 1.24 mg/dL   Calcium 9.2 8.9 - 10.3 mg/dL   Total Protein 7.0 6.5 - 8.1 g/dL   Albumin 3.8 3.5 - 5.0 g/dL   AST 35 15 - 41 U/L   ALT 51 17 - 63 U/L   Alkaline Phosphatase 67 38 - 126 U/L   Total Bilirubin 0.5 0.3 - 1.2 mg/dL   GFR calc non Af Amer >60 >60 mL/min   GFR calc Af Amer >60 >60 mL/min    Comment: (NOTE) The eGFR has been calculated using the CKD EPI equation. This calculation has not been validated in all clinical situations. eGFR's persistently <60 mL/min signify possible Chronic Kidney Disease.    Anion gap 10 5 - 15  Drug screen panel, emergency     Status: Abnormal   Collection Time: 12/26/14  1:10 AM  Result Value Ref Range   Opiates NONE DETECTED NONE DETECTED   Cocaine POSITIVE (A) NONE DETECTED   Benzodiazepines POSITIVE (A) NONE DETECTED   Amphetamines NONE DETECTED NONE DETECTED   Tetrahydrocannabinol NONE DETECTED NONE DETECTED   Barbiturates NONE  DETECTED NONE DETECTED    Comment:        DRUG SCREEN FOR MEDICAL PURPOSES ONLY.  IF CONFIRMATION IS NEEDED FOR ANY PURPOSE, NOTIFY LAB WITHIN 5 DAYS.        LOWEST DETECTABLE LIMITS FOR URINE DRUG SCREEN Drug Class       Cutoff (ng/mL) Amphetamine      1000 Barbiturate      200 Benzodiazepine   315 Tricyclics       400 Opiates          300 Cocaine          300 THC              50   Carboxyhemoglobin     Status: Abnormal   Collection Time: 12/26/14  5:49 AM  Result Value Ref Range   Total hemoglobin 13.8 13.5 - 18.0 g/dL   O2 Saturation 98.2 %   Carboxyhemoglobin 5.0 (HH) 0.5 - 1.5 %    Comment: CRITICAL RESULT CALLED TO, READ BACK BY AND VERIFIED WITH:  Berneice Gandy RRT AT [place] RRT,RCP ON 12/26/14    Methemoglobin 1.1 0.0 - 1.5 %  Acetaminophen level     Status: Abnormal   Collection  Time: 12/26/14  6:38 AM  Result Value Ref Range   Acetaminophen (Tylenol), Serum <10 (L) 10 - 30 ug/mL    Comment:        THERAPEUTIC CONCENTRATIONS VARY SIGNIFICANTLY. A RANGE OF 10-30 ug/mL MAY BE AN EFFECTIVE CONCENTRATION FOR MANY PATIENTS. HOWEVER, SOME ARE BEST TREATED AT CONCENTRATIONS OUTSIDE THIS RANGE. ACETAMINOPHEN CONCENTRATIONS >150 ug/mL AT 4 HOURS AFTER INGESTION AND >50 ug/mL AT 12 HOURS AFTER INGESTION ARE OFTEN ASSOCIATED WITH TOXIC REACTIONS.   Salicylate level     Status: None   Collection Time: 12/26/14  6:38 AM  Result Value Ref Range   Salicylate Lvl <5.9 2.8 - 30.0 mg/dL  Carboxyhemoglobin     Status: Abnormal   Collection Time: 12/26/14 10:18 AM  Result Value Ref Range   Total hemoglobin 14.0 13.5 - 18.0 g/dL   O2 Saturation 99.4 %   Carboxyhemoglobin 2.6 (H) 0.5 - 1.5 %   Methemoglobin 0.7 0.0 - 1.5 %    Physical Findings: AIMS: Facial and Oral Movements Muscles of Facial Expression: None, normal Lips and Perioral Area: None, normal Jaw: None, normal Tongue: None, normal,Extremity Movements Upper (arms, wrists, hands,  fingers): None, normal Lower (legs, knees, ankles, toes): None, normal, Trunk Movements Neck, shoulders, hips: None, normal, Overall Severity Severity of abnormal movements (highest score from questions above): None, normal Incapacitation due to abnormal movements: None, normal Patient's awareness of abnormal movements (rate only patient's report): No Awareness, Dental Status Current problems with teeth and/or dentures?: Yes Does patient usually wear dentures?: No  CIWA:    COWS:  COWS Total Score: 1  Treatment Plan Summary: Daily contact with patient to assess and evaluate symptoms and progress in treatment and Medication management Supportive approach/coping skills Opioid dependence; continue the Methadone maintenance Cocaine abuse; work a relapse prevention plan Depression; will try Cymbalta as also has chronic pain; Cymbalta 20 mg daily with plans to increase to 30 mg Will start Robaxin 500 mg BID for the muscular component of his back pain PTSD; continue to help to process the trauma and get to terms with it Mood instability; continue Trileptal consider increasing the dose Medical Decision Making:  Review of Psycho-Social Stressors (1), Review of Medication Regimen & Side Effects (2) and Review of New Medication or Change in Dosage (2)     Draydon Clairmont A 12/27/2014, 4:34 PM

## 2014-12-27 NOTE — [facility] Group Notes (Signed)
[facility] LCSW Aftercare Discharge Planning Group Note  12/27/2014  8:45 AM  Participation Quality: Did Not Attend. Patient invited to participate but declined.  Caleen Taaffe, MSW, LCSWA Clinical Social Worker [facility] [phone]   

## 2014-12-27 NOTE — Clinical Social Work Note (Signed)
At patient's request, CSW left voicemail for patient's counselor Les at [facility].  Samuella BruinKristin Joetta Delprado, MSW, [employer]CS[employer] Clinical Social Worker [facility] [phone]

## 2014-12-27 NOTE — Progress Notes (Signed)
Recreation Therapy Notes  Date: 12/27/14 Time: 9:30am Location: [address] Dayroom  Group Topic: Stress Management  Goal Area(s) Addresses:  Patient will verbalize importance of using healthy stress management.  Patient will identify positive emotions associated with healthy stress management.   Behavioral Response: Attentive, engaged  Intervention: Guided Imagery Script  Activity :  Patients will listen to LRT read the Guided Imagery Script and follow the prompts to become more relaxed.  Education:  Stress Management, Discharge Planning.   Education Outcome: Acknowledges edcuation/In group clarification offered  Clinical Observations/Feedback: Patient gave full participation.  Patient stated that he enjoyed the activity.  Patient also expressed that his heart was racing and that doing the activity allowed his heart rate to come down and he felt calmer.  Caroll RancherMarjette Tadhg Eskew, LRT/CTRS        Caroll RancherLindsay, Kendon Sedeno A 12/27/2014 4:30 PM
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
## 2014-12-27 NOTE — Clinical Social Work Note (Signed)
At patient's request, referral faxed to [facility] though he is not eligible for program at this time due to methadone.  Samuella BruinKristin Jamilla Galli, MSW, [employer]CSWA Clinical Social Worker [facility] [phone]

## 2014-12-27 NOTE — Plan of Care (Signed)
Problem: Alteration in mood Goal: STG-Patient is able to discuss feelings and issues (Patient is able to discuss feelings and issues leading to depression)  Outcome: Progressing Pt came to nursing staff stating he felt nervous because another pt keeps asking him to cheek his medications and to give them to the other. Pt was commended on telling the nursing staff and was reassured he would remain safe from this other pt.

## 2014-12-28 DIAGNOSIS — F1410 Cocaine abuse, uncomplicated: Secondary | ICD-10-CM

## 2014-12-28 DIAGNOSIS — F1120 Opioid dependence, uncomplicated: Secondary | ICD-10-CM

## 2014-12-28 DIAGNOSIS — F329 Major depressive disorder, single episode, unspecified: Secondary | ICD-10-CM

## 2014-12-28 MED ORDER — BUSPIRONE HCL 15 MG PO TABS
15.0000 mg | ORAL_TABLET | Freq: Three times a day (TID) | ORAL | Status: DC
  Administered 2014-12-28 – 2014-12-30 (×6): 15 mg via ORAL
  Filled 2014-12-28 (×7): qty 1
  Filled 2014-12-28: qty 3
  Filled 2014-12-28 (×4): qty 1

## 2014-12-28 MED ORDER — DULOXETINE HCL 30 MG PO CPEP
30.0000 mg | ORAL_CAPSULE | Freq: Every morning | ORAL | Status: DC
  Administered 2014-12-29 – 2014-12-31 (×3): 30 mg via ORAL
  Filled 2014-12-28 (×5): qty 1

## 2014-12-28 MED ORDER — POLYETHYLENE GLYCOL 3350 17 G PO PACK
17.0000 g | PACK | Freq: Every day | ORAL | Status: DC
  Filled 2014-12-28 (×8): qty 1

## 2014-12-28 NOTE — Plan of Care (Signed)
Problem: Alteration in mood Goal: LTG-Pt's behavior demonstrates decreased signs of depression 5/2: Goal not met: Pt presents with flat affect and depressed mood. Pt with depression rating of 8 today. Pt to show decreased sign of depression and a rating of 3 or less before d/c.   Tilden Fossa, MSW, [place] Worker [facility] [phone]     (Patient's behavior demonstrates decreased signs of depression to the point the patient is safe to return home and continue treatment in an outpatient setting)  Outcome: Not Met (add Reason) Patient continues to report passive SI.  States he don't know what would happen when he is discharged.  Reports depressive symptoms as an 8 today.

## 2014-12-28 NOTE — Plan of Care (Signed)
Problem: Diagnosis: Increased Risk For Suicide Attempt Goal: STG-Patient Will Report Suicidal Feelings to Staff Outcome: Progressing Pt endorses passive suicide ideation stating he does not want to hurt himself, and  has a lot of support system at home. Pt contracted to come to staff if feeling unsafe.

## 2014-12-28 NOTE — [facility] Group Notes (Signed)
[facility] Group Notes:  (Nursing/MHT/Case Management/Adjunct)  Date:  12/28/2014  Time:  0900 am  Type of Therapy:  Psychoeducational Skills  Participation Level:  None  Participation Quality:  Drowsy and Inattentive  Affect:  Flat  Cognitive:  Lacking  Insight:  Lacking  Engagement in Group:  None  Modes of Intervention:  Support  Summary of Progress/Problems: Patient slept during group.  Came up after group and apologized.  States he did not sleep well last night.  Cranford MonBeaudry, Lilienne Weins Evans 12/28/2014, 11:55 AM
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
## 2014-12-28 NOTE — [facility] Group Notes (Signed)
[facility] Group Notes:  (Clinical Social Work)  12/28/2014     10-11AM  Summary of Progress/Problems:   The main focus of today's process group was to learn how to use a decisional balance exercise to move forward in the Stages of Change, which were described and discussed.  Motivational Interviewing and a worksheet were utilized to help patients explore in depth the perceived benefits and costs of unhealthy coping techniques, as well as the  benefits and costs of replacing that with a healthy coping skills.   The patient expressed that their unhealthy coping involves lapsing into other identities that apparently use cocaine without him knowing it.  He participated minimally in group, slept mostly.  Type of Therapy:  Group Therapy - Process   Participation Level:  Minimal  Participation Quality:  Drowsy  Affect:  Blunted  Cognitive:  Oriented  Insight:  Limited  Engagement in Therapy:  Limited  Modes of Intervention:  Education, Motivational Interviewing  Ambrose MantleMareida Grossman-Orr, LCSW 12/28/2014, 12:15 PM

## 2014-12-28 NOTE — Progress Notes (Signed)
The patient attended the A.A. Meeting and was appropriate. He spoke up near the end of group and mentioned his history of mental illness and experiences with the [employer] and Companymilitary. He admits to having attended A.A. Meetings in the past.

## 2014-12-28 NOTE — Progress Notes (Signed)
Patient ID: Scott [name], male   DOB: [birth year], 44 y.o.   MRN: [id number] D: Patient alert and cooperative. Pt mood/affect is depressed and sad. Pt denies SI/HI/AVH. No acute distressed noted at this time.   A: Medications administered as prescribed. Writer encouraged pt to discuss feelings. Emotional support given and will continue to monitor pt's progress for stabilization.  R: Patient remains safe and complaint with medications. Pt attended evening AA group and engaged in discussion.

## 2014-12-28 NOTE — Progress Notes (Signed)
Patient ID: Scott [name], male   DOB: 22-Sep-1970, 44 y.o.   MRN: [id number] [facility] MD Progress Note  12/28/2014 4:05 PM Scott [name]  MRN:  [id number]  Subjective:  Scott [name] reports, "I really don't believe I'm in control when I used cocaine that I had cocaine in my system. That is unlike me. I blame it on Scott [name], my other personality. He is bad & mischievous.".  Objective: Scott [name] is seen, chart reviewed. Scott [name] is visible on Scott unit. He says he is still shock knowing that he has cocaine in his system. Says he does not remember using cocaine. He says he is no longer in control of his life or behavior knowing there are 3 personalities inside of him. Scott [name] assured me (Scott Clinical research associate) that he is Scott real Scott [name] talking to me. He described one of his personalities as Scott naughty & mischievous one "Scott [name]' who likely would use cocaine. Then, there is Scott [name], Scott male personality who is very loving & touchy and Scott child who plays well with his children. Scott [name] went further to say how he has attempted suicide 12 times, most Scott of attempts almost lethal if he had not been found in time. He is still complaining of excruciating pain to Scott right side of his body. He requested an increase on his Methadone dose, however, request declined. He is getting other adjunct treatment for pain. He requested another UDS.  Principal Problem: PTSD (post-traumatic stress disorder)  Diagnosis:   Patient Active Problem List   Diagnosis Date Noted  . MDD (major depressive disorder) [F32.2] 12/26/2014  . PTSD (post-traumatic stress disorder) [F43.10] 12/26/2014  . Opioid type dependence, continuous [F11.20] 12/26/2014  . Cocaine abuse with cocaine-induced mood disorder [F14.14] 12/26/2014  . Major depressive disorder, recurrent episode, moderate [F33.1]   . Chest pain at rest [R07.9] 10/22/2013  . 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 Chronic pain syndrome [G89.4] 10/22/2013  . Seizure disorder [G40.909]  10/22/2013  . Bipolar disorder, unspecified [F31.9] 10/22/2013  . Withdrawal symptoms, drug or narcotic [F19.939] 10/22/2013  . Chest pain [R07.9] 10/21/2013  . Leg swelling [M79.89] 10/21/2013   Total Time spent with patient: 25 minutes  Past Medical History:  Past Medical History  Diagnosis Date  . Back pain   . Seizures   . History of ETOH abuse   . Bipolar 1 disorder   . Myocardial infarction   . Anxiety   . Depression   . GERD (gastroesophageal reflux disease)   . Chronic back pain     Past Surgical History  Procedure Laterality Date  . Back surgery    . Appendectomy    . Abdominal surgery     Family History:  Family History  Problem Relation Age of Onset  . Hypertension Mother   . CAD Father   . COPD Father   . Stroke Father   . Testicular cancer Brother    Social History:  History  Alcohol Use No    Comment: last drink over 1 year ago     History  Drug Use  . Yes  . Special: Cocaine    History   Social History  . Marital Status: Married    Spouse Name: N/A  . Number of Children: N/A  . Years of Education: N/A   Social History Main Topics  . Smoking status: Heavy Tobacco Smoker -- 1.50 packs/day    Types: Cigarettes  . Smokeless tobacco: Never Used  . Alcohol Use: No     Comment:  last drink over 1 year ago  . Drug Use: Yes    Special: Cocaine  . Sexual Activity: Yes   Other Topics Concern  . None   Social History Narrative   Additional History:    Sleep: Fair  Appetite:  Fair  Assessment: Major depressive disorder, PTSD, Opioid type dependence, continuous.  Musculoskeletal: Strength & Muscle Tone: within normal limits Gait & Station: normal Patient leans: N/A  Psychiatric Specialty Exam: Physical Exam  ROS  Blood pressure 107/68, pulse 71, temperature 98.2 F (36.8 C), temperature source Oral, resp. rate 16, height 6' (1.829 m), weight 135.626 kg (299 lb), SpO2 100 %.Body mass index is 40.54 kg/(m^2).  General Appearance:  Fairly Groomed  Patent attorneyye Contact::  Fair  Speech:  Clear and Coherent  Volume:  Normal  Mood:  Anxious and worried  Affect:  anxious worried  Thought Process:  Coherent and Goal Directed  Orientation:  Full (Time, Place, and Person)  Thought Content:  symptoms events worries concerns  Suicidal Thoughts:  No  Homicidal Thoughts:  No  Memory:  Immediate;   Fair Recent;   Fair Remote;   Fair  Judgement:  Fair  Insight:  Present and Shallow  Psychomotor Activity:  Restlessness  Concentration:  Fair  Recall:  [employer]Fair  Fund of Knowledge:Fair  Language: Fair  Akathisia:  No  Handed:  Right  AIMS (if indicated):     Assets:  Desire for Improvement  ADL's:  Intact  Cognition: WNL  Sleep:  Number of Hours: 6.5   Current Medications: Current Facility-Administered Medications  Medication Dose Route Frequency Provider Last Rate Last Dose  . acetaminophen (TYLENOL) tablet 650 mg  650 mg Oral Q6H PRN Scott B Rankin, NP      . alum & mag hydroxide-simeth (MAALOX/MYLANTA) 200-200-20 MG/5ML suspension 30 mL  30 mL Oral Q4H PRN Scott B Rankin, NP      . docusate sodium (COLACE) capsule 100 mg  100 mg Oral BID PRN Scott B Rankin, NP   100 mg at 12/28/14 0913  . DULoxetine (CYMBALTA) DR capsule 20 mg  20 mg Oral q morning - 10a Scott FeeIrving A Lugo, MD   20 mg at 12/28/14 0757  . gabapentin (NEURONTIN) capsule 800 mg  800 mg Oral TID Scott FeeIrving A Lugo, MD   800 mg at 12/28/14 0756  . magnesium hydroxide (MILK OF MAGNESIA) suspension 30 mL  30 mL Oral Daily PRN Scott B Rankin, NP      . methadone (DOLOPHINE) tablet 100 mg  100 mg Oral Daily Scott FeeIrving A Lugo, MD   100 mg at 12/28/14 0754  . methocarbamol (ROBAXIN) tablet 500 mg  500 mg Oral BID Scott FeeIrving A Lugo, MD   500 mg at 12/28/14 0756  . nicotine (NICODERM CQ - dosed in mg/24 hours) patch 21 mg  21 mg Transdermal Daily Scott FeeIrving A Lugo, MD   21 mg at 12/28/14 0757  . OXcarbazepine (TRILEPTAL) tablet 150 mg  150 mg Oral TID Scott B Rankin, NP   150 mg at 12/28/14  1203  . traZODone (DESYREL) tablet 100 mg  100 mg Oral QHS Scott FeeIrving A Lugo, MD   100 mg at 12/27/14 2251   Lab Results:  No results found for this or any previous visit (from Scott past 48 hour(s)).  Physical Findings: AIMS: Facial and Oral Movements Muscles of Facial Expression: None, normal Lips and Perioral Area: None, normal Jaw: None, normal Tongue: None, normal,Extremity Movements Upper (arms, wrists, hands, fingers): None, normal Lower (  legs, knees, ankles, toes): None, normal, Trunk Movements Neck, shoulders, hips: None, normal, Overall Severity Severity of abnormal movements (highest score from questions above): None, normal Incapacitation due to abnormal movements: None, normal Patient's awareness of abnormal movements (rate only patient's report): No Awareness, Dental Status Current problems with teeth and/or dentures?: Yes Does patient usually wear dentures?: No  CIWA:    COWS:  COWS Total Score: 1  Treatment Plan Summary: Daily contact with patient to assess and evaluate symptoms and progress in treatment and Medication management Supportive approach/coping skills. Opioid dependence; continue Scott Methadone maintenance Cocaine abuse; work a relapse prevention plan Depression; continue Cymbalta as also has chronic pain; increased Cymbalta to 30 mg daily Continue Robaxin 500 mg BID for Scott muscular component of back pain PTSD; continue to help to process Scott trauma and get to terms with it Mood instability; continue Trileptal consider increasing Scott dose. Obtain urine for drug test per patient request.  Medical Decision Making:  Review of Psycho-Social Stressors (1), Review of Medication Regimen & Side Effects (2) and Review of New Medication or Change in Dosage (2)  Scott [name], PMHNP-BC 12/28/2014, 4:05 PM I reviewed chart and agreed with Scott findings and treatment Plan.  Scott Sharper, MD

## 2014-12-28 NOTE — Progress Notes (Addendum)
Pt stated he passed some blood in his stool earlier. Bruce, tech. Confirmed. NP. Aggie made aware and pt was instructed to let nursing know if he passes blood again rectally. Pt denies Si and HI and contracts for safety.Pts urine collected and sent for a urine drug screen.

## 2014-12-28 NOTE — Progress Notes (Signed)
Patient did attend the evening speaker AA meeting.  

## 2014-12-29 NOTE — Progress Notes (Signed)
Patient ID: Scott [name], male   DOB: February 17, 1971, 44 y.o.   MRN: [id number] Patient ID: Scott [name], male   DOB: Nov 17, 1970, 44 y.o.   MRN: [id number] [facility] MD Progress Note  12/29/2014 1:32 PM Karem Tomaso  MRN:  [id number]  Subjective:  Tracer reports, "It is not a bad day today, I feel a lot better"  Objective: Gorman is seen, chart reviewed. Valerian is visible on the unit. He says he is feeling much better today. He says he plans on following-up care at the [facility] clinic after discharge from this hospital. However, he is interested in going for further treatment for his substance abuse. [facility] [place] [facility].  treatment for pain. He requested another UDS.  Principal Problem: PTSD (post-traumatic stress disorder)  Diagnosis:   Patient Active Problem List   Diagnosis Date Noted  . MDD (major depressive disorder) [F32.2] 12/26/2014  . PTSD (post-traumatic stress disorder) [F43.10] 12/26/2014  . Opioid type dependence, continuous [F11.20] 12/26/2014  . Cocaine abuse with cocaine-induced mood disorder [F14.14] 12/26/2014  . Major depressive disorder, recurrent episode, moderate [F33.1]   . Chest pain at rest [R07.9] 10/22/2013  . 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 Chronic pain syndrome [G89.4] 10/22/2013  . Seizure disorder [G40.909] 10/22/2013  . Bipolar disorder, unspecified [F31.9] 10/22/2013  . Withdrawal symptoms, drug or narcotic [F19.939] 10/22/2013  . Chest pain [R07.9] 10/21/2013  . Leg swelling [M79.89] 10/21/2013   Total Time spent with patient: 25 minutes  Past Medical History:  Past Medical History  Diagnosis Date  . Back pain   . Seizures   . History of ETOH abuse   . Bipolar 1 disorder   . Myocardial infarction   . Anxiety   . Depression   . GERD (gastroesophageal reflux disease)   . Chronic back pain     Past Surgical History  Procedure Laterality Date  . Back surgery    . Appendectomy     . Abdominal surgery     Family History:  Family History  Problem Relation Age of Onset  . Hypertension Mother   . CAD Father   . COPD Father   . Stroke Father   . Testicular cancer Brother    Social History:  History  Alcohol Use No    Comment: last drink over 1 year ago     History  Drug Use  . Yes  . Special: Cocaine    History   Social History  . Marital Status: Married    Spouse Name: N/A  . Number of Children: N/A  . Years of Education: N/A   Social History Main Topics  . Smoking status: Heavy Tobacco Smoker -- 1.50 packs/day    Types: Cigarettes  . Smokeless tobacco: Never Used  . Alcohol Use: No     Comment: last drink over 1 year ago  . Drug Use: Yes    Special: Cocaine  . Sexual Activity: Yes   Other Topics Concern  . None   Social History Narrative   Additional History:    Sleep: Fair  Appetite:  Fair  Assessment: Major depressive disorder, PTSD, Opioid type dependence, continuous.  Musculoskeletal: Strength & Muscle Tone: within normal limits Gait & Station: normal Patient leans: N/A  Psychiatric Specialty Exam: Physical Exam  ROS  Blood pressure 131/91, pulse 80, temperature 97.9 F (36.6 C), temperature source Oral, resp. rate 20, height 6' (1.829 m), weight 135.626 kg (299 lb), SpO2 100 %.  Body mass index is 40.54 kg/(m^2).  General Appearance: Fairly Groomed  Patent attorney::  Fair  Speech:  Clear and Coherent  Volume:  Normal  Mood:  Anxious and worried  Affect:  anxious worried  Thought Process:  Coherent and Goal Directed  Orientation:  Full (Time, Place, and Person)  Thought Content:  symptoms events worries concerns  Suicidal Thoughts:  No  Homicidal Thoughts:  No  Memory:  Immediate;   Fair Recent;   Fair Remote;   Fair  Judgement:  Fair  Insight:  Present and Shallow  Psychomotor Activity:  Normal  Concentration:  Fair  Recall:  [employer] of Knowledge:Fair  Language: Fair  Akathisia:  No  Handed:  Right  AIMS  (if indicated):     Assets:  Desire for Improvement  ADL's:  Intact  Cognition: WNL  Sleep:  Number of Hours: 6.5   Current Medications: Current Facility-Administered Medications  Medication Dose Route Frequency Provider Last Rate Last Dose  . acetaminophen (TYLENOL) tablet 650 mg  650 mg Oral Q6H PRN Shuvon B Rankin, NP      . alum & mag hydroxide-simeth (MAALOX/MYLANTA) 200-200-20 MG/5ML suspension 30 mL  30 mL Oral Q4H PRN Shuvon B Rankin, NP   30 mL at 12/29/14 0614  . busPIRone (BUSPAR) tablet 15 mg  15 mg Oral TID Sanjuana Kava, NP   15 mg at 12/29/14 1157  . docusate sodium (COLACE) capsule 100 mg  100 mg Oral BID PRN Shuvon B Rankin, NP   100 mg at 12/28/14 0913  . DULoxetine (CYMBALTA) DR capsule 30 mg  30 mg Oral q morning - 10a Sanjuana Kava, NP   30 mg at 12/29/14 1610  . gabapentin (NEURONTIN) capsule 800 mg  800 mg Oral TID Rachael Fee, MD   800 mg at 12/29/14 0800  . magnesium hydroxide (MILK OF MAGNESIA) suspension 30 mL  30 mL Oral Daily PRN Shuvon B Rankin, NP      . methadone (DOLOPHINE) tablet 100 mg  100 mg Oral Daily Rachael Fee, MD   100 mg at 12/29/14 0759  . methocarbamol (ROBAXIN) tablet 500 mg  500 mg Oral BID Rachael Fee, MD   500 mg at 12/29/14 0800  . nicotine (NICODERM CQ - dosed in mg/24 hours) patch 21 mg  21 mg Transdermal Daily Rachael Fee, MD   21 mg at 12/29/14 0839  . OXcarbazepine (TRILEPTAL) tablet 150 mg  150 mg Oral TID Shuvon B Rankin, NP   150 mg at 12/29/14 1159  . polyethylene glycol (MIRALAX / GLYCOLAX) packet 17 g  17 g Oral Daily Sanjuana Kava, NP   Stopped at 12/29/14 [phone]  . traZODone (DESYREL) tablet 100 mg  100 mg Oral QHS Rachael Fee, MD   100 mg at 12/28/14 2116   Lab Results:  No results found for this or any previous visit (from the past 48 hour(s)).  Physical Findings: AIMS: Facial and Oral Movements Muscles of Facial Expression: None, normal Lips and Perioral Area: None, normal Jaw: None, normal Tongue: None,  normal,Extremity Movements Upper (arms, wrists, hands, fingers): None, normal Lower (legs, knees, ankles, toes): None, normal, Trunk Movements Neck, shoulders, hips: None, normal, Overall Severity Severity of abnormal movements (highest score from questions above): None, normal Incapacitation due to abnormal movements: None, normal Patient's awareness of abnormal movements (rate only patient's report): No Awareness, Dental Status Current problems with teeth and/or dentures?: Yes Does patient usually wear dentures?:  No  CIWA:    COWS:  COWS Total Score: 1  Treatment Plan Summary: Daily contact with patient to assess and evaluate symptoms and progress in treatment and Medication management Supportive approach/coping skills. Opioid dependence; continue the Methadone maintenance Cocaine abuse; work a relapse prevention plan Depression; continue Cymbalta as also has chronic pain; continue Cymbalta 30 mg daily Continue Robaxin 500 mg BID for the muscular component of back pain PTSD; continue to help to process the trauma and get to terms with it Mood instability; continue Trileptal consider increasing the dose.  Medical Decision Making:  Review of Psycho-Social Stressors (1), Review of Medication Regimen & Side Effects (2) and Review of New Medication or Change in Dosage (2)  Sanjuana Kavawoko, Agnes I, PMHNP-BC 12/29/2014, 1:32 PM I reviewed chart and agreed with the findings and treatment Plan.  Kathryne SharperSyed Chaske Paskett, MD

## 2014-12-29 NOTE — Progress Notes (Addendum)
D: Pt presents depressed in affect and mood. Pt is currently denying any SI/HI/AVH. Pt present within the milieu but with minimal interactions with others. Pt requested for writer to review his given and scheduled meds. Writer fulfilled pt's request.   A: Writer administered scheduled and prn medications to pt, per MD orders. Continued support and availability as needed was extended to this pt. Staff continue to monitor pt with q4415min checks.  R: No adverse drug reactions noted. Pt receptive to treatment. Pt remains safe at this time.

## 2014-12-29 NOTE — [facility] Group Notes (Signed)
[facility] Group Notes:  (Clinical Social Work)  12/29/2014  10:00-11:00AM  Summary of Progress/Problems:   The main focus of today's process group was to   1)  discuss the importance of adding supports  2)  define health supports versus unhealthy supports  3)  identify the patient's current unhealthy supports and plan how to handle them  4)  Identify the patient's current healthy supports and plan what to add.  An emphasis was placed on using counselor, doctor, therapy groups, 12-step groups, and problem-specific support groups to expand supports.    The patient expressed full comprehension of the concepts presented, and agreed that there is a need to add more supports.  The patient told about two different dreams he has had and what they told him about creating new bridges to other people/new supports.  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  He dropped off to sleep quickly several times, did contribute to discussion while awake.  Type of Therapy:  Process Group with Motivational Interviewing  Participation Level:  Active  Participation Quality:  Attentive, Drowsy and Sharing  Affect:  Appropriate  Cognitive:  Appropriate and Oriented  Insight:  Developing/Improving  Engagement in Therapy:  Engaged  Modes of Intervention:   Education, Support and Processing, Activity  Ambrose MantleMareida Grossman-Orr, LCSW 12/29/2014

## 2014-12-29 NOTE — Progress Notes (Signed)
Patient did attend the evening speaker AA meeting.  

## 2014-12-29 NOTE — Progress Notes (Addendum)
D: Patient complaining of severe diarrhea today.  Advised patient that he was given a lot of medicine for constipation yesterday.  He continues to endorse depressive symptoms.  He rates his depression as an 8; hopelessness as a 7; anxiety as a 6.  He denies any SI/HI/AVH.  He denies any withdrawal symptoms.  He attended group this morning and slept.  Patient plans to follow up with [facility] and [facility].  He states that he plans on tapering off the methodone.  Patient states his wife is supportive and she wants him to stop taking it. A: continue to monitor medication management and MD orders.  Safety checks completed every 15 minutes per protocol.  Meet 1:1 with patient to discuss concerns and offer encouragement. R: Patient's behavior is appropriate for situation.

## 2014-12-30 LAB — RAPID URINE DRUG SCREEN, HOSP PERFORMED
Amphetamines: NOT DETECTED
Barbiturates: NOT DETECTED
Benzodiazepines: POSITIVE — AB
Cocaine: POSITIVE — AB
Opiates: NOT DETECTED
Tetrahydrocannabinol: NOT DETECTED

## 2014-12-30 MED ORDER — BUSPIRONE HCL 15 MG PO TABS
7.5000 mg | ORAL_TABLET | Freq: Three times a day (TID) | ORAL | Status: DC
  Administered 2014-12-30 – 2015-01-02 (×8): 7.5 mg via ORAL
  Filled 2014-12-30 (×2): qty 1
  Filled 2014-12-30: qty 2
  Filled 2014-12-30 (×9): qty 1

## 2014-12-30 MED ORDER — BUSPIRONE HCL 15 MG PO TABS
7.5000 mg | ORAL_TABLET | Freq: Three times a day (TID) | ORAL | Status: DC
  Filled 2014-12-30 (×2): qty 1

## 2014-12-30 MED ORDER — DICYCLOMINE HCL 10 MG PO CAPS
10.0000 mg | ORAL_CAPSULE | Freq: Two times a day (BID) | ORAL | Status: DC | PRN
  Administered 2014-12-30 – 2015-01-01 (×4): 10 mg via ORAL
  Filled 2014-12-30 (×5): qty 1

## 2014-12-30 MED ORDER — BUSPIRONE HCL 15 MG PO TABS: 15.0000 mg | ORAL_TABLET | Freq: Three times a day (TID) | ORAL | Status: DC

## 2014-12-30 MED ORDER — TRAZODONE HCL 50 MG PO TABS
50.0000 mg | ORAL_TABLET | Freq: Every day | ORAL | Status: DC
  Administered 2014-12-30 – 2015-01-01 (×3): 50 mg via ORAL
  Filled 2014-12-30 (×4): qty 1
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
## 2014-12-30 NOTE — [facility] Group Notes (Signed)
Pt attended group and was appropriate and attentative

## 2014-12-30 NOTE — Progress Notes (Signed)
Patient ID: Scott [name], male   DOB: [birth year], 44 y.o.   MRN: [id number] PER STATE REGULATIONS 482.30  THIS CHART WAS REVIEWED FOR MEDICAL NECESSITY WITH RESPECT TO THE PATIENT'S ADMISSION/ DURATION OF STAY.  NEXT REVIEW DATE: 01/03/2015  Willa RoughJENNIFER JONES Makylah Bossard, RN, BSN CASE MANAGER

## 2014-12-30 NOTE — Progress Notes (Signed)
Recreation Therapy Notes  Date: 12/30/14 Time: 9:30am Location: [address] Dayroom  Group Topic: Stress Management  Goal Area(s) Addresses:  Patient will verbalize importance of using healthy stress management.  Patient will identify positive emotions associated with healthy stress management.   Intervention: Stress Management  Activity :  Guided Imagery.  LRT introduced and educated patients on stress management technique of guided imagery.  Scripts were used to deliver the technique to patients, patients were asked to follow script read allowed by LRT to engage in practicing the stress management technique.  Education:  Stress Management, Discharge Planning.   Education Outcome: Needs additional education  Clinical Observations/Feedback:  Patient did not attend.   Caroll RancherMarjette Latajah Thuman, LRT/CTRS         Caroll RancherLindsay, Kandas Oliveto A 12/30/2014 3:39 PM

## 2014-12-30 NOTE — [facility] Group Notes (Signed)
[facility] LCSW Group Therapy 12/30/2014  1:15 pm  Type of Therapy: Group Therapy Participation Level: Active  Participation Quality: Attentive, Sharing and Supportive  Affect: Lethargic  Cognitive: Alert and Oriented  Insight: Developing/Improving and Engaged  Engagement in Therapy: Developing/Improving and Engaged  Modes of Intervention: Clarification, Confrontation, Discussion, Education, Exploration,  Limit-setting, Orientation, Problem-solving, Rapport Building, Dance movement psychotherapisteality Testing, Socialization and Support  Summary of Progress/Problems: Pt identified obstacles faced currently and processed barriers involved in overcoming these obstacles. Pt identified steps necessary for overcoming these obstacles and explored motivation (internal and external) for facing these difficulties head on. Pt further identified one area of concern in their lives and chose a goal to focus on for today. Patient identified his goal as to relieve his depression. He plans to do this by focusing on the positive things in his life such as his family and stability, and trying to think positively. He suspects that he chooses the depression over happiness for attention or because he does not feel worthy of happiness. Patient expressed that he feels strongly that he can get into recovery again. CSW and other group members provided patient with emotional support and encouragement.  Samuella BruinKristin Duel Conrad, MSW, [employer]LCSWA Clinical Social Worker [facility] [phone]

## 2014-12-30 NOTE — Progress Notes (Signed)
D. Individual c/o generalized body pains. He also c/o stomach upset with diarrhea. His behavior is calm and cooperative. Depressed affect. Denies SI/HI ; A/V hallucinations. Per pt self inventory pt reports his depression today 8/10, hopelessness 6/10, and anxiety 6/10 on 1-10 scale, scale being the worse. He also expresses a strong desire of "wanting to get off methadone." Reported that individual has been drowsy; falling asleep in group.    A. Medication administered as prescribed. He remains on special checks q 15 mins for safety.   R: Compliant with medication, safety maintained

## 2014-12-30 NOTE — [facility] Group Notes (Signed)
   [facility]  12/30/2014  8:45 AM   Participation Quality: Alert, Appropriate and Oriented  Mood/Affect: Depressed and Flat; lethargic as witnessed by difficulty staying awake during group  Depression Rating: 8  Anxiety Rating: 4  Thoughts of Suicide: Pt denies SI/HI  Will you contract for safety? Yes  Current AVH: Pt denies  Plan for Discharge/Comments: Pt attended discharge planning group and actively participated in group. CSW provided pt with today's workbook. Patient reports feeling "worse" today. He plans to return home to follow up with [facility] outpatient.  Transportation Means: Pt reports access to transportation  Supports: Patient identifies his family as supportive.  Samuella BruinKristin Evellyn Tuff, MSW, [employer]LCSWA Clinical Social Worker [facility] [phone]

## 2014-12-30 NOTE — Progress Notes (Signed)
D: Pt presents flat in affect but brightens upon interaction. Pt endorses a depressed affect. Pt is currently negative for any SI/HI/AVH. Pt verbalizes a decrease in abdominal cramping. Pt is visible and active within the milieu.  A: Writer administered scheduled and prn medications to pt, per MD orders. Continued support and availability as needed was extended to this pt. Staff continue to monitor pt with q5815min checks.  R: No adverse drug reactions noted. Pt receptive to treatment. Pt remains safe at this time.

## 2014-12-31 DIAGNOSIS — F1414 Cocaine abuse with cocaine-induced mood disorder: Secondary | ICD-10-CM

## 2014-12-31 DIAGNOSIS — M7989 Other specified soft tissue disorders: Secondary | ICD-10-CM

## 2014-12-31 DIAGNOSIS — F331 Major depressive disorder, recurrent, moderate: Secondary | ICD-10-CM

## 2014-12-31 DIAGNOSIS — F4310 Post-traumatic stress disorder, unspecified: Principal | ICD-10-CM

## 2014-12-31 MED ORDER — KETOROLAC TROMETHAMINE 30 MG/ML IJ SOLN
30.0000 mg | Freq: Four times a day (QID) | INTRAMUSCULAR | Status: DC | PRN
Start: 2014-12-31 — End: 2015-01-02
  Administered 2014-12-31: 30 mg via INTRAMUSCULAR
  Filled 2014-12-31: qty 1

## 2014-12-31 MED ORDER — POTASSIUM CHLORIDE CRYS ER 10 MEQ PO TBCR
10.0000 meq | EXTENDED_RELEASE_TABLET | Freq: Every day | ORAL | Status: DC
  Administered 2014-12-31 – 2015-01-02 (×3): 10 meq via ORAL
  Filled 2014-12-31 (×6): qty 1

## 2014-12-31 MED ORDER — FUROSEMIDE 20 MG PO TABS
20.0000 mg | ORAL_TABLET | Freq: Every day | ORAL | Status: DC
  Administered 2014-12-31 – 2015-01-02 (×3): 20 mg via ORAL
  Filled 2014-12-31 (×6): qty 1

## 2014-12-31 NOTE — Tx Team (Signed)
Interdisciplinary Treatment Plan Update (Adult) Date: 12/31/2014   Time Reviewed: 9:30 AM  Progress in Treatment: Attending groups: Yes Participating in groups: Yes Taking medication as prescribed: Yes Tolerating medication: Yes Family/Significant other contact made: Yes, CSW has spoken with patient's wife Patient understands diagnosis: Yes Discussing patient identified problems/goals with staff: Yes Medical problems stabilized or resolved: Yes Denies suicidal/homicidal ideation: Yes Issues/concerns per patient self-inventory: Yes Other:  New problem(s) identified: N/A  Discharge Plan or Barriers:  5/20: Patient plans to return home to follow up with [facility] for outpatient treatment. His goal is to taper off of methadone and switch to another provider eventually.   5/24: Pt continues to progress in treatment. Plan is still to return home and follow-up with [facility]. MD continuing to assess for medication changes and monitoring.   Reason for Continuation of Hospitalization:  Depression Anxiety Medication Stabilization   Comments: N/A  Estimated length of stay: 2-3 days  For review of initial/current patient goals, please see plan of care.  Patient is a 44 year old Caucasian Male admitted for suicide attempt by carbon monoxide poisoning. Patient reports multiple previous attempts. He reports a decline in his functioning during the past 8 months after exploring EMDR therapy with his therapist at [facility]. 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 He is currently on a methadone maintenance program at [facility]. [place] wife and 3 children. He plans to return at discharge and continue services at [facility]. Patient will benefit from crisis stabilization, medication evaluation, group therapy, and psycho education in addition to case management for discharge planning. Patient and CSW reviewed pt's identified goals and treatment plan. Pt verbalized understanding  and agreed to treatment plan.   Attendees: Patient:  12/31/2014 8:30 AM   Family:  12/31/2014 8:30 AM   Physician: Dr. Geoffery LyonsIrving Lugo, MD 12/31/2014 8:30 AM   Nursing: Mikki HarborAdam M., Andrea RN 12/31/2014 8:30 AM   Clinical Serena ColonelAggie Nwoko, NP 12/31/2014 8:30 AM   Clinical Social Worker: Belenda CruiseKristin Drinkard LCSWA; Chad CordialLauren Carter, LCSWA 12/31/2014 8:30 AM   Other: Onnie BoerJennifer Clark, Nurse Case Manager 12/31/2014 8:30 AM   Other: Leisa LenzValerie Enoch; [facility]  [id number] 8:30 AM   Other:  12/31/2014 8:30 AM   Other:  [id number] 8:30 AM         Scribe for Treatment Team:  Chad CordialLauren Carter, Theresia MajorsLCSWA [phone]

## 2014-12-31 NOTE — Progress Notes (Signed)
D: Pt complained of shortness of breath and leg swelling A:  BP taken at 1633 125/67 pulse was 57 R: Nurse notified

## 2014-12-31 NOTE — [facility] Group Notes (Signed)
[facility] 12/31/2014 1:15pm  Type of Therapy: [facility] Presentation  Participation Level: Minimal  Participation Quality: Lethargic  Affect: Appropriate  Cognitive: Oriented  Insight: Unable to assess; Pt sleeping  Engagement in Therapy: Disengaged  Modes of Intervention: Discussion, Education and Socialization  Summary of Progress/Problems: Mental Health Association (MHA) Speaker came to talk about his personal journey with substance abuse and addiction. MHA speaker provided handouts and educational information pertaining to groups and services offered by the [facility]. Pt lethargic throughout speaker session, sleeping through majority of session.   Chad CordialLauren Carter, LCSWA 12/31/2014 1:31 PM

## 2014-12-31 NOTE — Progress Notes (Signed)
D:Pt rates depression as a 7 and hopelessness as a 4 on 1-10 scale with 10 being the most. Pt has been out in the dayroom interacting with peers. His goal for today is to try to think more positively. Pt c/o pain in his lower back that is chronic and he says never goes below a 4.  A:Supported pt to discuss feelings. Offered encouragement and 15 minute checks. Gave medications as ordered. R:Pt denies si and hi. Safety maintained on the unit.

## 2014-12-31 NOTE — Consult Note (Signed)
Patient Demographics  Scott [name], is a 44 y.o. male   MRN: [id number]   DOB - 1971-07-11  Admit Date - 12/26/2014    Outpatient Primary MD for the patient is Pcp Not In System  Consult requested in the Hospital by Rachael Fee, MD, On 12/31/2014    Reason for consult swelling in his legs   With History of -  Past Medical History  Diagnosis Date  . Back pain   . Seizures   . History of ETOH abuse   . Bipolar 1 disorder   . Myocardial infarction   . Anxiety   . Depression   . GERD (gastroesophageal reflux disease)   . Chronic back pain       Past Surgical History  Procedure Laterality Date  . Back surgery    . Appendectomy    . Abdominal surgery      in for   Admitted to behavioral Hospital for attempted suicide  HPI  Scott [name]  is a 44 y.o. male, history of bipolar disorder, chronic back pain, chronic neuropathy in both lower extremities, history of alcohol abuse, cocaine abuse, smoking, counseled to quit all, chronic methadone user with chronic pain, history of bilateral lower extremity edema with chronic diastolic dysfunction EF of 60% on echogram in 2015.  With above history was admitted to behavioral health Hospital after attempted suicide when he chose to inhale carbon monoxide coming out of his car, he was found by his wife and was brought to [facility] ER where he was medically stabilized and then transferred to behavioral Hospital 5 days ago. Patient was seen today by mid-level PA and psych hospital and she noticed that he had some swelling and I was called to assist for the same.  He shouldn't confirms that he has chronic swelling for at least last couple of years, he denies any headache fever chills, denies any cough shortness of breath, no orthopnea or PND, he does have an  demented and with chest pains for the last 2-3 years none at this time, no abdominal pain or discomfort, no diarrhea, he has no blood in stool or urine, no focal weakness. He is currently not suicidal.    Review of Systems    In addition to the HPI above,   No Fever-chills, No Headache, No changes with Vision or hearing, No problems swallowing food or Liquids, No Chest pain, Cough or Shortness of Breath, No Abdominal pain, No Nausea or Vommitting, Bowel movements are regular, No Blood in stool or Urine, No dysuria, No new skin rashes or bruises, No new joints pains-aches, does have chronic back pain and burning sensation in his legs, No new weakness, tingling, numbness in any extremity, No recent weight gain or loss, No polyuria, polydypsia or polyphagia, No significant Mental Stressors.  A full 10 point Review of Systems was done, except as stated above, all other Review of Systems were negative.   Social History History  Substance Use Topics  . Smoking status: Heavy  Tobacco Smoker -- 1.50 packs/day    Types: Cigarettes  . Smokeless tobacco: Never Used  . Alcohol Use: No     Comment: last drink over 1 year ago      Family History Family History  Problem Relation Age of Onset  . Hypertension Mother   . CAD Father   . COPD Father   . Stroke Father   . Testicular cancer Brother       Prior to Admission medications   Medication Sig Start Date End Date Taking? Authorizing Provider  omeprazole (PRILOSEC) 20 MG capsule Take 20 mg by mouth daily.   Yes Historical Provider, MD  busPIRone (BUSPAR) 15 MG tablet Take 15 mg by mouth 3 (three) times daily.    Historical Provider, MD  citalopram (CELEXA) 20 MG tablet Take 20 mg by mouth daily.    Historical Provider, MD  docusate sodium (COLACE) 100 MG capsule Take 100 mg by mouth 2 (two) times daily as needed for mild constipation.     Historical Provider, MD  gabapentin (NEURONTIN) 400 MG capsule Take 800 mg by mouth 3 (three)  times daily.    Historical Provider, MD  methadone (DOLOPHINE) 10 MG/ML solution Take 99 mg by mouth daily.    Historical Provider, MD  OXcarbazepine (TRILEPTAL) 150 MG tablet Take 150 mg by mouth 3 (three) times daily.     Historical Provider, MD  Potassium Gluconate 595 MG CAPS Take 1 capsule by mouth daily.    Historical Provider, MD  risperiDONE (RISPERDAL) 2 MG tablet Take 2 mg by mouth at bedtime.    Historical Provider, MD    Anti-infectives    None      Scheduled Meds: . busPIRone  7.5 mg Oral TID  . DULoxetine  30 mg Oral q morning - 10a  . furosemide  20 mg Oral Daily  . gabapentin  800 mg Oral TID  . methadone  100 mg Oral Daily  . methocarbamol  500 mg Oral BID  . nicotine  21 mg Transdermal Daily  . OXcarbazepine  150 mg Oral TID  . polyethylene glycol  17 g Oral Daily  . traZODone  50 mg Oral QHS   Continuous Infusions:  PRN Meds:.acetaminophen, alum & mag hydroxide-simeth, dicyclomine, docusate sodium, ketorolac, magnesium hydroxide  Allergies  Allergen Reactions  . Benadryl [Diphenhydramine Hcl] Anaphylaxis and Other (See Comments)    Muscles lock up.  . Celecoxib Other (See Comments)    Nose bleeds, Headache, All over Discomfort  . Prednisone Other (See Comments)    Severe Anger  . Risperidone And Related Other (See Comments)    Overly sedates  . Rofecoxib Swelling and Other (See Comments)    Nose Bleeds  . Nsaids Rash and Other (See Comments)    Nose bleeds    Physical Exam  Vitals  Blood pressure 119/56, pulse 63, temperature 98.5 F (36.9 C), temperature source Oral, resp. rate 16, height 6' (1.829 m), weight 135.626 kg (299 lb), SpO2 100 %.   1. General middle aged Obese white male in NAD,    2. Flat affect and insight, Awake Alert, Oriented X 3.  3. No F.N deficits, ALL C.Nerves Intact, Strength 5/5 all 4 extremities, Sensation intact all 4 extremities, Plantars down going.  4. Ears and Eyes appear Normal, Conjunctivae clear, PERRLA.  Moist Oral Mucosa.  5. Supple Neck, No JVD, No cervical lymphadenopathy appriciated, No Carotid Bruits.  6. Symmetrical Chest wall movement, Good air movement bilaterally, CTAB.  7. RRR,  No Gallops, Rubs or Murmurs, No Parasternal Heave.  8. Positive Bowel Sounds, Abdomen Soft, No tenderness, No organomegaly appriciated,No rebound -guarding or rigidity.  9.  No Cyanosis, Normal Skin Turgor, No Skin Rash or Bruise. 1+ Leg edema  10. Good muscle tone,  joints appear normal , no effusions, Normal ROM.  11. No Palpable Lymph Nodes in Neck or Axillae     Data Review  CBC  Recent Labs Lab 12/25/14 2327  WBC 7.4  HGB 14.3  HCT 42.3  PLT 254  MCV 87.2  MCH 29.5  MCHC 33.8  RDW 14.2  LYMPHSABS 1.9  MONOABS 0.6  EOSABS 0.1  BASOSABS 0.0   ------------------------------------------------------------------------------------------------------------------  Chemistries   Recent Labs Lab 12/25/14 2327  NA 140  K 3.5  CL 103  CO2 27  GLUCOSE 116*  BUN 12  CREATININE 0.97  CALCIUM 9.2  AST 35  ALT 51  ALKPHOS 67  BILITOT 0.5   ------------------------------------------------------------------------------------------------------------------ estimated creatinine clearance is 141.4 mL/min (by C-G formula based on Cr of 0.97). ------------------------------------------------------------------------------------------------------------------ No results for input(s): TSH, T4TOTAL, T3FREE, THYROIDAB in the last 72 hours.  Invalid input(s): FREET3   Coagulation profile No results for input(s): INR, PROTIME in the last 168 hours. ------------------------------------------------------------------------------------------------------------------- No results for input(s): DDIMER in the last 72 hours. -------------------------------------------------------------------------------------------------------------------  Cardiac Enzymes No results for input(s): CKMB, TROPONINI,  MYOGLOBIN in the last 168 hours.  Invalid input(s): CK ------------------------------------------------------------------------------------------------------------------ Invalid input(s): POCBNP   ---------------------------------------------------------------------------------------------------------------  Urinalysis    Component Value Date/Time   COLORURINE STRAW* 10/22/2013 1004   APPEARANCEUR CLEAR 10/22/2013 1004   LABSPEC 1.031* 10/22/2013 1004   PHURINE 5.0 10/22/2013 1004   GLUCOSEU NEGATIVE 10/22/2013 1004   HGBUR NEGATIVE 10/22/2013 1004   BILIRUBINUR NEGATIVE 10/22/2013 1004   KETONESUR NEGATIVE 10/22/2013 1004   PROTEINUR NEGATIVE 10/22/2013 1004   UROBILINOGEN 0.2 10/22/2013 1004   NITRITE NEGATIVE 10/22/2013 1004   LEUKOCYTESUR NEGATIVE 10/22/2013 1004     Imaging results:   No results found.  My personal review of EKG: Rhythm S Brady, Rate  50/min,  no Acute ST changes    Assessment & Plan   1. Chronic bilateral lower extremity swelling related to obesity and mild chronic diastolic dysfunction with EF 60% on echogram one year ago.  We will place the patient on 20 mg of daily Lasix along with 10 mg of daily potassium, have instructed to be on a heart healthy low-salt diet with 1.5 L fluid restriction per day, have counseled him on fluid restriction, will repeat BMP in 3 days. Will request psych team to monitor BMP and call us with any questions. He should continue the Lasix and potassium supplementation with monitoring of BMP once a week while he is at the psych facility, please call hospitalist team for any questions if electrolytes are abnormal. Key will be appropriate diet and fluid restriction.     2. Acute on chronic diastolic dysfunction. Treatment as in above. We'll refrain from beta blocker due to ongoing cocaine abuse.   3. Atypical intermittent chest pain. Had a unrevealing thorough workup one year ago which included echogram, CT angiogram of  the chest and a stress test. Counseled to abstain from smoking, cocaine and other forms of occasional drug use.   4. Bipolar disorder with attempted suicide. Chronic pain. Polysubstance abuse. Defer to primary team which is psych.     Family Communication: Plan discussed with patient and Psych PA Agee   Thank you for the consult, please call hospitalist team  for any questions for any abnormal electrolytes is noted. Monitor BMP once a week.   Leroy SeaSINGH,Kori Goins K M.D on 12/31/2014 at 5:09 PM  Between 7am to 7pm - Pager - [phone]  After 7pm go to [email or web address] - password [email or web address]   Thank you for the consult, we will follow the patient with you in the Hospital.   [facility]   Office  [phone]

## 2014-12-31 NOTE — Progress Notes (Signed)
Recreation Therapy Notes  Animal-Assisted Activity (AAA) Program Checklist/Progress Notes Patient Eligibility Criteria Checklist & Daily Group note for Rec Tx Intervention  Date: 12/31/14 Time: 2:30pm Location: [address] Dayroom   AAA/T Program Assumption of Risk Form signed by Patient/ or Parent Legal Guardian yes  Patient is free of allergies or sever asthma yes  Patient reports no fear of animals yes  Patient reports no history of cruelty to animalsyes  Patient understands his/her participation is voluntary yes  Patient washes hands before animal contact yes  Patient washes hands after animal contact yes  Education: Hand Washing, Appropriate Animal Interaction   Education Outcome: Acknowledges understanding/In group clarification offered/Needs additional education.   Clinical Observations/Feedback: Patient did not attend group.   Sohaib Vereen, LRT/CTRS         Scott [name] 12/31/2014 4:27 PM 

## 2014-12-31 NOTE — Progress Notes (Signed)
Pt attended AA group and was appropriate and attentive 

## 2014-12-31 NOTE — Progress Notes (Signed)
[facility] MD Progress Note  12/31/2014 5:47 PM Scott [name]  MRN:  [id number] Subjective:  Scott [name] continues to own up to what he has done while in active cocaine addiction. He is having a hard time with back pain and later today with leg swelling. He is wanting to wean off the Methadone and come off, but wants to do it on an outpatient basis in the clinic. Buspar was reduced as well as the Trazodone. He has not been as sleepy during the day. After he comes off the Methadone he would like to go to long term residential treatment program  Principal Problem: PTSD (post-traumatic stress disorder) Diagnosis:   Patient Active Problem List   Diagnosis Date Noted  . MDD (major depressive disorder) [F32.2] 12/26/2014  . PTSD (post-traumatic stress disorder) [F43.10] 12/26/2014  . Opioid type dependence, continuous [F11.20] 12/26/2014  . Cocaine abuse with cocaine-induced mood disorder [F14.14] 12/26/2014  . Major depressive disorder, recurrent episode, moderate [F33.1]   . Chest pain at rest [R07.9] 10/22/2013  . 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 Chronic pain syndrome [G89.4] 10/22/2013  . Seizure disorder [G40.909] 10/22/2013  . Bipolar disorder, unspecified [F31.9] 10/22/2013  . Withdrawal symptoms, drug or narcotic [F19.939] 10/22/2013  . Chest pain [R07.9] 10/21/2013  . Leg swelling [M79.89] 10/21/2013   Total Time spent with patient: 30 minutes   Past Medical History:  Past Medical History  Diagnosis Date  . Back pain   . Seizures   . History of ETOH abuse   . Bipolar 1 disorder   . Myocardial infarction   . Anxiety   . Depression   . GERD (gastroesophageal reflux disease)   . Chronic back pain     Past Surgical History  Procedure Laterality Date  . Back surgery    . Appendectomy    . Abdominal surgery     Family History:  Family History  Problem Relation Age of Onset  . Hypertension Mother   . CAD Father   . COPD Father   . Stroke Father   . Testicular  cancer Brother    Social History:  History  Alcohol Use No    Comment: last drink over 1 year ago     History  Drug Use  . Yes  . Special: Cocaine    History   Social History  . Marital Status: Married    Spouse Name: N/A  . Number of Children: N/A  . Years of Education: N/A   Social History Main Topics  . Smoking status: Heavy Tobacco Smoker -- 1.50 packs/day    Types: Cigarettes  . Smokeless tobacco: Never Used  . Alcohol Use: No     Comment: last drink over 1 year ago  . Drug Use: Yes    Special: Cocaine  . Sexual Activity: Yes   Other Topics Concern  . None   Social History Narrative   Additional History:    Sleep: Fair  Appetite:  Fair   Assessment:   Musculoskeletal: Strength & Muscle Tone: within normal limits Gait & Station: normal Patient leans: normal   Psychiatric Specialty Exam: Physical Exam  Review of Systems  Constitutional: Negative.   HENT: Negative.   Eyes: Negative.   Respiratory: Negative.   Cardiovascular: Negative.   Gastrointestinal: Negative.   Genitourinary: Negative.   Musculoskeletal: Positive for back pain.       Leg swealling  Skin: Negative.   Neurological: Negative.   Endo/Heme/Allergies: Negative.   Psychiatric/Behavioral: Positive for depression and  substance abuse. The patient is nervous/anxious.     Blood pressure 119/56, pulse 63, temperature 98.5 F (36.9 C), temperature source Oral, resp. rate 16, height 6' (1.829 m), weight 135.626 kg (299 lb), SpO2 100 %.Body mass index is 40.54 kg/(m^2).  General Appearance: Fairly Groomed  Patent attorneyye Contact::  Fair  Speech:  Clear and Coherent  Volume:  Normal  Mood:  Anxious  Affect:  anxious worried  Thought Process:  logical coherent relevant  Orientation:  Full (Time, Place, and Person)  Thought Content:  symptoms events worries concerns  Suicidal Thoughts:  No  Homicidal Thoughts:  No  Memory:  Immediate;   Fair Recent;   Fair Remote;   Fair  Judgement:  Fair   Insight:  Present  Psychomotor Activity:  Restlessness  Concentration:  Fair  Recall:  FiservFair  Fund of Knowledge:Fair  Language: Fair  Akathisia:  No  Handed:  Right  AIMS (if indicated):     Assets:  Desire for Improvement Housing Social Support  ADL's:  Intact  Cognition: WNL  Sleep:  Number of Hours: 5.5     Current Medications: Current Facility-Administered Medications  Medication Dose Route Frequency Provider Last Rate Last Dose  . acetaminophen (TYLENOL) tablet 650 mg  650 mg Oral Q6H PRN Shuvon B Rankin, NP   650 mg at 12/30/14 0539  . alum & mag hydroxide-simeth (MAALOX/MYLANTA) 200-200-20 MG/5ML suspension 30 mL  30 mL Oral Q4H PRN Shuvon B Rankin, NP   30 mL at 12/31/14 0126  . busPIRone (BUSPAR) tablet 7.5 mg  7.5 mg Oral TID Rachael FeeIrving A Zaeem Kandel, MD   7.5 mg at 12/31/14 1612  . dicyclomine (BENTYL) capsule 10 mg  10 mg Oral BID PRN Worthy FlankIjeoma E Nwaeze, NP   10 mg at 12/30/14 1631  . docusate sodium (COLACE) capsule 100 mg  100 mg Oral BID PRN Shuvon B Rankin, NP   100 mg at 12/28/14 0913  . DULoxetine (CYMBALTA) DR capsule 30 mg  30 mg Oral q morning - 10a Sanjuana KavaAgnes I Nwoko, NP   30 mg at 12/31/14 [id number]  . furosemide (LASIX) tablet 20 mg  20 mg Oral Daily Leroy SeaPrashant K Singh, MD      . gabapentin (NEURONTIN) capsule 800 mg  800 mg Oral TID Rachael FeeIrving A Legna Mausolf, MD   800 mg at 12/31/14 1612  . ketorolac (TORADOL) 30 MG/ML injection 30 mg  30 mg Intramuscular Q6H PRN Rachael FeeIrving A Keelen Quevedo, MD   30 mg at 12/31/14 1048  . magnesium hydroxide (MILK OF MAGNESIA) suspension 30 mL  30 mL Oral Daily PRN Shuvon B Rankin, NP      . methadone (DOLOPHINE) tablet 100 mg  100 mg Oral Daily Rachael FeeIrving A Algie Cales, MD   100 mg at 12/31/14 0801  . methocarbamol (ROBAXIN) tablet 500 mg  500 mg Oral BID Rachael FeeIrving A Marylou Wages, MD   500 mg at 12/31/14 0804  . nicotine (NICODERM CQ - dosed in mg/24 hours) patch 21 mg  21 mg Transdermal Daily Rachael FeeIrving A Tomika Eckles, MD   21 mg at 12/31/14 0800  . OXcarbazepine (TRILEPTAL) tablet 150 mg  150 mg Oral TID  Shuvon B Rankin, NP   150 mg at 12/31/14 1612  . polyethylene glycol (MIRALAX / GLYCOLAX) packet 17 g  17 g Oral Daily Sanjuana KavaAgnes I Nwoko, NP   Stopped at 12/29/14 [phone]  . potassium chloride (K-DUR,KLOR-CON) CR tablet 10 mEq  10 mEq Oral Daily Leroy SeaPrashant K Singh, MD      . traZODone (  DESYREL) tablet 50 mg  50 mg Oral QHS Rachael Fee, MD   50 mg at 12/30/14 2304    Lab Results: No results found for this or any previous visit (from the past 48 hour(s)).  Physical Findings: AIMS: Facial and Oral Movements Muscles of Facial Expression: None, normal Lips and Perioral Area: None, normal Jaw: None, normal Tongue: None, normal,Extremity Movements Upper (arms, wrists, hands, fingers): None, normal Lower (legs, knees, ankles, toes): None, normal, Trunk Movements Neck, shoulders, hips: None, normal, Overall Severity Severity of abnormal movements (highest score from questions above): None, normal Incapacitation due to abnormal movements: None, normal Patient's awareness of abnormal movements (rate only patient's report): No Awareness, Dental Status Current problems with teeth and/or dentures?: Yes Does patient usually wear dentures?: No  CIWA:    COWS:  COWS Total Score: 1  Treatment Plan Summary: Daily contact with patient to assess and evaluate symptoms and progress in treatment and Medication management Supportive approach/coping skills Cocaine abuse; work a relapse prevention plan Major depression; increase the Cymbalta to 60 mg daily Pain; has used Toradol successfully before; will order the Toradol 30 mg IM Leg swelling; internal medicine consulted, will follow recommendations PTSD; continue to help process the trauma and get to terms with it  Medical Decision Making:  Review of Psycho-Social Stressors (1), Review or order clinical lab tests (1), Review of Medication Regimen & Side Effects (2) and Review of New Medication or Change in Dosage (2)     Amaani Guilbault A 12/31/2014, 5:47 PM

## 2015-01-01 MED ORDER — PANTOPRAZOLE SODIUM 40 MG PO TBEC
40.0000 mg | DELAYED_RELEASE_TABLET | Freq: Every day | ORAL | Status: DC
  Administered 2015-01-01 – 2015-01-02 (×2): 40 mg via ORAL
  Filled 2015-01-01 (×4): qty 1

## 2015-01-01 NOTE — Progress Notes (Signed)
Pt attended NA group this evening.  

## 2015-01-01 NOTE — Progress Notes (Signed)
Recreation Therapy Notes  Date: 01/01/15 Time: 9:30am Location: [address] Group Room  Group Topic: Stress Management  Goal Area(s) Addresses:  Patient will verbalize importance of using healthy stress management.  Patient will identify positive emotions associated with healthy stress management.   Intervention: Progressive Muscle Relaxation Script  Activity :  Patient will listen as LRT leads them through progressive muscle relaxation.  Patients will be lead through each muscle group from head to toe.  Education:  Stress Management, Discharge Planning.   Education Outcome: Acknowledges edcuation/In group clarification offered/Needs additional education  Clinical Observations/Feedback: Patient did not attend.  Caroll RancherMarjette Messiah Ahr, LRT/CTRS         Caroll RancherLindsay, Deloise Marchant A 01/01/2015 1:36 PM

## 2015-01-01 NOTE — Progress Notes (Signed)
D: Patient continues to complain of stomach cramping and diarrhea.  Bentyl administered.  Patient presents with bright affect; pleasant mood.  He denies SI/HI/AVH.  He reports his depressive symptoms have decreased to a 5; he denies any hopelessness and anxiety.  He states he is sleeping and eating well.  Remains with pain in his lower lumbar and right leg.  He goal today is "getting out of here and starting my life in a positive direction."  Patient is also on I/O's due to swelling. A: Continue to monitor medication management and MD orders.  Safety checks completed every 15 minutes per protocol.  Meet 1:1 with patient to discuss concerns and offer encouragement. R: Patient's behavior is appropriate to situation.

## 2015-01-01 NOTE — Progress Notes (Signed)
[facility] MD Progress Note  01/01/2015 5:04 PM Scott [name]  MRN:  [id number] Subjective:  Scott [name] stated that he started experiencing severe diarrhea. He then remembered that the Cymbalta gave him diarrhea for what he had to D/C it. He does not want to get immodium as he is afraid he is going to then develop constipation given he is on methadone. He wants it to "run its course." He asked to be released tomorrow as his wife is having baby sitting issues. The leg swelling is better on the Lasix.  Principal Problem: PTSD (post-traumatic stress disorder) Diagnosis:   Patient Active Problem List   Diagnosis Date Noted  . MDD (major depressive disorder) [F32.2] 12/26/2014  . PTSD (post-traumatic stress disorder) [F43.10] 12/26/2014  . Opioid type dependence, continuous [F11.20] 12/26/2014  . Cocaine abuse with cocaine-induced mood disorder [F14.14] 12/26/2014  . Major depressive disorder, recurrent episode, moderate [F33.1]   . Chest pain at rest [R07.9] 10/22/2013  . 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 Chronic pain syndrome [G89.4] 10/22/2013  . Seizure disorder [G40.909] 10/22/2013  . Bipolar disorder, unspecified [F31.9] 10/22/2013  . Withdrawal symptoms, drug or narcotic [F19.939] 10/22/2013  . Chest pain [R07.9] 10/21/2013  . Leg swelling [M79.89] 10/21/2013   Total Time spent with patient: 30 minutes   Past Medical History:  Past Medical History  Diagnosis Date  . Back pain   . Seizures   . History of ETOH abuse   . Bipolar 1 disorder   . Myocardial infarction   . Anxiety   . Depression   . GERD (gastroesophageal reflux disease)   . Chronic back pain     Past Surgical History  Procedure Laterality Date  . Back surgery    . Appendectomy    . Abdominal surgery     Family History:  Family History  Problem Relation Age of Onset  . Hypertension Mother   . CAD Father   . COPD Father   . Stroke Father   . Testicular cancer Brother    Social History:   History  Alcohol Use No    Comment: last drink over 1 year ago     History  Drug Use  . Yes  . Special: Cocaine    History   Social History  . Marital Status: Married    Spouse Name: N/A  . Number of Children: N/A  . Years of Education: N/A   Social History Main Topics  . Smoking status: Heavy Tobacco Smoker -- 1.50 packs/day    Types: Cigarettes  . Smokeless tobacco: Never Used  . Alcohol Use: No     Comment: last drink over 1 year ago  . Drug Use: Yes    Special: Cocaine  . Sexual Activity: Yes   Other Topics Concern  . None   Social History Narrative   Additional History:    Sleep: Fair  Appetite:  Fair   Assessment:   Musculoskeletal: Strength & Muscle Tone: within normal limits Gait & Station: normal Patient leans: N/A   Psychiatric Specialty Exam: Physical Exam  Review of Systems  Constitutional: Negative.   HENT: Negative.   Eyes: Negative.   Respiratory: Negative.   Cardiovascular: Negative.   Gastrointestinal: Positive for diarrhea.  Genitourinary: Negative.   Musculoskeletal: Negative.   Skin: Negative.   Neurological: Negative.   Endo/Heme/Allergies: Negative.   Psychiatric/Behavioral: Positive for depression and substance abuse. The patient is nervous/anxious.     Blood pressure 119/56, pulse 63, temperature 98.5 F (36.9 C),  temperature source Oral, resp. rate 16, height 6' (1.829 m), weight 135.626 kg (299 lb), SpO2 100 %.Body mass index is 40.54 kg/(m^2).  General Appearance: Fairly Groomed  Patent attorney::  Fair  Speech:  Clear and Coherent  Volume:  Normal  Mood:  Anxious and worried  Affect:  Appropriate  Thought Process:  Coherent and Goal Directed  Orientation:  Full (Time, Place, and Person)  Thought Content:  symptoms events worries concerns  Suicidal Thoughts:  No  Homicidal Thoughts:  No  Memory:  Immediate;   Fair Recent;   Fair Remote;   Fair  Judgement:  Fair  Insight:  Present and Shallow  Psychomotor  Activity:  Restlessness  Concentration:  Fair  Recall:  [employer] of Knowledge:Fair  Language: Fair  Akathisia:  No  Handed:  Right  AIMS (if indicated):     Assets:  Desire for Improvement Housing Social Support  ADL's:  Intact  Cognition: WNL  Sleep:  Number of Hours: 5     Current Medications: Current Facility-Administered Medications  Medication Dose Route Frequency Provider Last Rate Last Dose  . acetaminophen (TYLENOL) tablet 650 mg  650 mg Oral Q6H PRN Shuvon B Rankin, NP   650 mg at 12/30/14 0539  . alum & mag hydroxide-simeth (MAALOX/MYLANTA) 200-200-20 MG/5ML suspension 30 mL  30 mL Oral Q4H PRN Shuvon B Rankin, NP   30 mL at 12/31/14 0126  . busPIRone (BUSPAR) tablet 7.5 mg  7.5 mg Oral TID Rachael Fee, MD   7.5 mg at 01/01/15 1128  . dicyclomine (BENTYL) capsule 10 mg  10 mg Oral BID PRN Worthy Flank, NP   10 mg at 01/01/15 0854  . docusate sodium (COLACE) capsule 100 mg  100 mg Oral BID PRN Shuvon B Rankin, NP   100 mg at 12/28/14 0913  . furosemide (LASIX) tablet 20 mg  20 mg Oral Daily Leroy Sea, MD   20 mg at 01/01/15 0758  . gabapentin (NEURONTIN) capsule 800 mg  800 mg Oral TID Rachael Fee, MD   800 mg at 01/01/15 0758  . ketorolac (TORADOL) 30 MG/ML injection 30 mg  30 mg Intramuscular Q6H PRN Rachael Fee, MD   30 mg at 12/31/14 1048  . magnesium hydroxide (MILK OF MAGNESIA) suspension 30 mL  30 mL Oral Daily PRN Shuvon B Rankin, NP      . methadone (DOLOPHINE) tablet 100 mg  100 mg Oral Daily Rachael Fee, MD   100 mg at 01/01/15 0757  . methocarbamol (ROBAXIN) tablet 500 mg  500 mg Oral BID Rachael Fee, MD   500 mg at 01/01/15 0759  . nicotine (NICODERM CQ - dosed in mg/24 hours) patch 21 mg  21 mg Transdermal Daily Rachael Fee, MD   21 mg at 01/01/15 0757  . OXcarbazepine (TRILEPTAL) tablet 150 mg  150 mg Oral TID Shuvon B Rankin, NP   150 mg at 01/01/15 1129  . pantoprazole (PROTONIX) EC tablet 40 mg  40 mg Oral Daily Kerry Hough, PA-C    40 mg at 01/01/15 0636  . polyethylene glycol (MIRALAX / GLYCOLAX) packet 17 g  17 g Oral Daily Sanjuana Kava, NP   Stopped at 12/29/14 [phone]  . potassium chloride (K-DUR,KLOR-CON) CR tablet 10 mEq  10 mEq Oral Daily Leroy Sea, MD   10 mEq at 01/01/15 0758  . traZODone (DESYREL) tablet 50 mg  50 mg Oral QHS Rachael Fee, MD  50 mg at 12/31/14 2115    Lab Results: No results found for this or any previous visit (from the past 48 hour(s)).  Physical Findings: AIMS: Facial and Oral Movements Muscles of Facial Expression: None, normal Lips and Perioral Area: None, normal Jaw: None, normal Tongue: None, normal,Extremity Movements Upper (arms, wrists, hands, fingers): None, normal Lower (legs, knees, ankles, toes): None, normal, Trunk Movements Neck, shoulders, hips: None, normal, Overall Severity Severity of abnormal movements (highest score from questions above): None, normal Incapacitation due to abnormal movements: None, normal Patient's awareness of abnormal movements (rate only patient's report): No Awareness, Dental Status Current problems with teeth and/or dentures?: Yes Does patient usually wear dentures?: No  CIWA:    COWS:  COWS Total Score: 1  Treatment Plan Summary: Daily contact with patient to assess and evaluate symptoms and progress in treatment and Medication management Supportive approach/coping skills Opioid dependence; continue Methadone maintenance at 100 mg in AM. Will work on coming off the Methadone on an outpatient basis. After being off would like to go to [facility] Cocaine abuse: will work on developing a relapse prevention plan Note; after the group session he came out of the group and the Child psychotherapistsocial worker stated "this is [employer]" Scott [name] had a blank expression. This behavior  was not reinforced He came later and stated that his wife had called again and that she needed him there. He asked to be D/C. He was told that we could D/C him tomorrow as we still  needed to follow up on his electrolytes as recommended by internal medicine. He accepted this plan. No more mention of [facility]   Medical Decision Making:  Review of Psycho-Social Stressors (1), Review of Medication Regimen & Side Effects (2) and Review of New Medication or Change in Dosage (2)     Erna Brossard A 01/01/2015, 5:04 PM

## 2015-01-01 NOTE — Clinical Social Work Note (Signed)
CSW received call from [facility] staff who have declined his referral based on acuity and severity of past suicide attempts.   Samuella BruinKristin Dajai Wahlert, MSW, [employer]CSWA Clinical Social Worker [facility] [phone]

## 2015-01-01 NOTE — [facility] Group Notes (Signed)
[facility]  01/01/2015 8:45 AM  Participation Quality: Appropriate and Oriented; Lethargic  Mood/Affect: Mood Congruent; Improving Mood  Depression Rating: 0  Anxiety Rating: 0  Thoughts of Suicide: Pt denies SI/HI  Will you contract for safety? Yes  Current AVH: Pt denies  Plan for Discharge/Comments: Pt attended discharge planning group and actively participated in group. CSW provided pt with today's workbook. Pt complains of stomach pain due to medication but expressed that his mental state is improving. Pt able to verbalize short-term and long-term treatment plans. He will return home and follow-up with [facility] and [facility] for medication management, therapy, and substance abuse counseling.   Transportation Means: Pt reports access to transportation  Supports: Wife  Chad CordialLauren Carter, LCSWA 01/01/2015 9:15 AM

## 2015-01-01 NOTE — Progress Notes (Addendum)
D: Pt presents anxious in affect and appropriate in mood. Pt attends group and interacts with others appropriately. Pt is currently negative for any SI/HI/AVH. Pt verbalizes readiness to return home. Pt is looking forward to seeing his sons. Pt had no GI complaints this evening. A: Writer administered scheduled medications to pt, per MD orders. Continued support and availability as needed was extended to this pt. Staff continue to monitor pt with q6215min checks.  R: No adverse drug reactions noted. Pt receptive to treatment. Pt remains safe at this time.

## 2015-01-01 NOTE — Progress Notes (Signed)
Patient appears down and depressed.  Asked him what was going on.  He stated, "I had a dissociative incident today.  Dr. Dub MikesLugo doesn't believe me.  I have it documented.  It is real."  Social worker Belenda CruiseKristin had reported earlier that "Scott [name]" came out during group.

## 2015-01-01 NOTE — Progress Notes (Signed)
D: Pt presents anxious in affect and mood. Pt is visible and active within the milieu. Pt is currently negative for any SI/HI/AVH. Pt complains of explosive diarrhea around 0315. Pt reports that Cymbalta has caused him to have "really bad" diarrhea in the past. Pt encouraged to speak with the psychiatrist in regards to his medication regimen. Pt also complains of having sulfur burps. Pt revisits the fact that he takes prisolec OTC at home. A: Pt was placed on Protonix 40 mg daily for GERD. Writer administered scheduled and prn medications to pt, per MD orders. Continued support and availability as needed was extended to this pt. Staff continue to monitor pt with q5815min checks.  R: No adverse drug reactions noted. Pt receptive to treatment. Pt remains safe at this time.

## 2015-01-01 NOTE — [facility] Group Notes (Signed)
[facility] LCSW Group Therapy 01/01/2015  1:15 PM Type of Therapy: Group Therapy Participation Level: Active  Participation Quality: Attentive, Sharing and Supportive  Affect: Anxious  Cognitive: Alert and Oriented  Insight: Developing/Improving and Engaged  Engagement in Therapy: Developing/Improving and Engaged  Modes of Intervention: Clarification, Confrontation, Discussion, Education, Exploration, Limit-setting, Orientation, Problem-solving, Rapport Building, Dance movement psychotherapisteality Testing, Socialization and Support  Summary of Progress/Problems: The topic for group today was emotional regulation. This group focused on both positive and negative emotion identification and allowed group members to process ways to identify feelings, regulate negative emotions, and find healthy ways to manage internal/external emotions. Group members were asked to reflect on a time when their reaction to an emotion led to a negative outcome and explored how alternative responses using emotion regulation would have benefited them. Group members were also asked to discuss a time when emotion regulation was utilized when a negative emotion was experienced. 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 He reported not knowing where he was and feeling very confused. CSW reassured patient that he was safe and informed him that he was at the hospital. CSW informed patient that was welcome to stay for group or he could leave group at anytime if he felt overwhelmed. Patient engaged in discussion regarding managing anger, fear, and loneliness. CSW and other group members provided patient with emotional support and encouragement.  Samuella BruinKristin Fronnie Urton, MSW, [employer]CSWA Clinical Social Worker [facility] [phone]

## 2015-01-02 MED ORDER — METHADONE HCL 10 MG/ML PO CONC: 99.0000 mg | Freq: Every day | ORAL | Status: DC

## 2015-01-02 MED ORDER — BUSPIRONE HCL 7.5 MG PO TABS: 7.5000 mg | ORAL_TABLET | Freq: Three times a day (TID) | ORAL | Status: DC

## 2015-01-02 MED ORDER — OMEPRAZOLE 20 MG PO CPDR: 20.0000 mg | DELAYED_RELEASE_CAPSULE | Freq: Every day | ORAL | Status: DC

## 2015-01-02 MED ORDER — FUROSEMIDE 20 MG PO TABS
20.0000 mg | ORAL_TABLET | Freq: Every day | ORAL | Status: DC
Start: 2015-01-02 — End: 2017-06-06

## 2015-01-02 MED ORDER — NICOTINE 21 MG/24HR TD PT24: 21.0000 mg | MEDICATED_PATCH | Freq: Every day | TRANSDERMAL | Status: DC

## 2015-01-02 MED ORDER — GABAPENTIN 400 MG PO CAPS: 800.0000 mg | ORAL_CAPSULE | Freq: Three times a day (TID) | ORAL | Status: DC

## 2015-01-02 MED ORDER — POTASSIUM CHLORIDE CRYS ER 10 MEQ PO TBCR: 10.0000 meq | EXTENDED_RELEASE_TABLET | Freq: Every day | ORAL | Status: DC

## 2015-01-02 MED ORDER — TRAZODONE HCL 50 MG PO TABS
50.0000 mg | ORAL_TABLET | Freq: Every day | ORAL | Status: DC
Start: 2015-01-02 — End: 2017-06-06

## 2015-01-02 MED ORDER — OXCARBAZEPINE 150 MG PO TABS: 150.0000 mg | ORAL_TABLET | Freq: Three times a day (TID) | ORAL | Status: DC
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
## 2015-01-02 NOTE — Progress Notes (Signed)
  [facility]  Will you be returning to the same living situation after discharge:  Yes,  Patient returned home At discharge, do you have transportation home?: Yes,  Patient provided with PART fare Do you have the ability to pay for your medications: Yes,  patient will be provided with medication samples and prescriptions  Release of information consent forms completed and in the chart;  Patient's signature needed at discharge.  Patient to Follow up at: Follow-up Information    Follow up with [facility].   Why:  Please go to [facility] at discharge to resume outpatient services.   Contact information:   [address], [address] [place], [place] [address] Office: [phone](336) [phone]  Fax: [phone]      Patient denies SI/HI: Yes,  denies    Safety Planning and Suicide Prevention discussed: Yes,  with patient and wife  Have you used any form of tobacco in the last 30 days? (Cigarettes, Smokeless Tobacco, Cigars, and/or Pipes): Yes  Has patient been referred to the Quitline?: Yes, faxed on 01/02/15  Alexea Blase, [place] CarboKristin L 01/02/2015, 5:17 PM

## 2015-01-02 NOTE — Progress Notes (Signed)
Discharge orders received. Pt. Agreeable to discharge. [facility] and prescriptions provided and reviewed. Pt. Verbalizes understanding of discharge instructions. Pt. Offers no questions or concerns. Pt. Denies SI, HI, pain or discomfort.  Pt. Belongings returned and signed for. Pt. Escorted to lobby.  

## 2015-02-02 ENCOUNTER — Encounter (HOSPITAL_BASED_OUTPATIENT_CLINIC_OR_DEPARTMENT_OTHER): Payer: Self-pay | Admitting: Emergency Medicine

## 2015-02-02 ENCOUNTER — Emergency Department (HOSPITAL_BASED_OUTPATIENT_CLINIC_OR_DEPARTMENT_OTHER)
Admission: EM | Admit: 2015-02-02 | Discharge: 2015-02-02 | Disposition: A | Discharge status: Home / Self Care | Payer: Medicare Other | Attending: Emergency Medicine | Admitting: Emergency Medicine

## 2015-02-02 DIAGNOSIS — Z79899 Other long term (current) drug therapy: Secondary | ICD-10-CM | POA: Insufficient documentation

## 2015-02-02 DIAGNOSIS — Z720 Tobacco use: Secondary | ICD-10-CM | POA: Diagnosis not present

## 2015-02-02 DIAGNOSIS — I252 Old myocardial infarction: Secondary | ICD-10-CM | POA: Diagnosis not present

## 2015-02-02 DIAGNOSIS — F419 Anxiety disorder, unspecified: Secondary | ICD-10-CM | POA: Diagnosis not present

## 2015-02-02 DIAGNOSIS — G40909 Epilepsy, unspecified, not intractable, without status epilepticus: Secondary | ICD-10-CM | POA: Insufficient documentation

## 2015-02-02 DIAGNOSIS — G8929 Other chronic pain: Secondary | ICD-10-CM | POA: Insufficient documentation

## 2015-02-02 DIAGNOSIS — K219 Gastro-esophageal reflux disease without esophagitis: Secondary | ICD-10-CM | POA: Diagnosis not present

## 2015-02-02 DIAGNOSIS — Z9889 Other specified postprocedural states: Secondary | ICD-10-CM | POA: Insufficient documentation

## 2015-02-02 DIAGNOSIS — M5416 Radiculopathy, lumbar region: Secondary | ICD-10-CM | POA: Insufficient documentation

## 2015-02-02 DIAGNOSIS — F319 Bipolar disorder, unspecified: Secondary | ICD-10-CM | POA: Diagnosis not present

## 2015-02-02 DIAGNOSIS — E669 Obesity, unspecified: Secondary | ICD-10-CM | POA: Insufficient documentation

## 2015-02-02 DIAGNOSIS — M545 Low back pain: Secondary | ICD-10-CM | POA: Diagnosis present

## 2015-02-02 HISTORY — DX: Opioid dependence, uncomplicated: F11.20

## 2015-02-02 MED ORDER — HYDROCODONE-ACETAMINOPHEN 5-325 MG PO TABS: ORAL_TABLET | ORAL | Status: DC

## 2015-02-02 MED ORDER — CYCLOBENZAPRINE HCL 10 MG PO TABS: 10.0000 mg | ORAL_TABLET | Freq: Two times a day (BID) | ORAL | Status: DC | PRN

## 2015-02-02 MED ORDER — MORPHINE SULFATE 4 MG/ML IJ SOLN
4.0000 mg | Freq: Once | INTRAMUSCULAR | Status: AC
  Administered 2015-02-02: 4 mg via INTRAMUSCULAR
  Filled 2015-02-02: qty 1

## 2015-02-02 MED ORDER — CYCLOBENZAPRINE HCL 10 MG PO TABS
10.0000 mg | ORAL_TABLET | Freq: Once | ORAL | Status: AC
  Administered 2015-02-02: 10 mg via ORAL
  Filled 2015-02-02: qty 1

## 2015-02-02 MED ORDER — PREDNISONE 20 MG PO TABS: 40.0000 mg | ORAL_TABLET | Freq: Every day | ORAL | Status: DC

## 2015-02-02 MED ORDER — PREDNISONE 50 MG PO TABS
60.0000 mg | ORAL_TABLET | Freq: Once | ORAL | Status: AC
  Administered 2015-02-02: 60 mg via ORAL
  Filled 2015-02-02 (×2): qty 1

## 2015-02-02 NOTE — ED Provider Notes (Signed)
CSN: [id number]     Arrival date & time 02/02/15  1941 History   First MD Initiated Contact with Patient 02/02/15 2152     Chief Complaint  Patient presents with  . Back Pain  . Leg Pain     (Consider location/radiation/quality/duration/timing/severity/associated sxs/prior Treatment) HPI   Blood pressure 123/85, pulse 67, temperature 98.1 F (36.7 C), temperature source Oral, resp. rate 20, height 6' (1.829 m), weight 292 lb (132.45 kg), SpO2 99 %.  Josheph Nicholes is a 44 y.o. male complaining of low back pain radiating down the lateral thigh with hyperesthesia describes as a burning sensation. Patient has had no trauma. He's had 2 lumbar laminectomies, this performed at [facility]. Denies fever, chills, change in bowel or bladder habits, h/o IDVU or cancer, numbness or weakness.   Past Medical History  Diagnosis Date  . Back pain   . Seizures   . History of ETOH abuse   . Bipolar 1 disorder   . Myocardial infarction   . Anxiety   . Depression   . GERD (gastroesophageal reflux disease)   . Chronic back pain   . Opioid dependence    Past Surgical History  Procedure Laterality Date  . Back surgery    . Appendectomy    . Abdominal surgery     Family History  Problem Relation Age of Onset  . Hypertension Mother   . CAD Father   . COPD Father   . Stroke Father   . Testicular cancer Brother    History  Substance Use Topics  . Smoking status: Heavy Tobacco Smoker -- 1.50 packs/day    Types: Cigarettes  . Smokeless tobacco: Never Used  . Alcohol Use: No     Comment: last drink over 1 year ago    Review of Systems  10 systems reviewed and found to be negative, except as noted in the HPI.   Allergies  Benadryl; Celecoxib; Prednisone; Risperidone and related; Rofecoxib; and Nsaids  Home Medications   Prior to Admission medications   Medication Sig Start Date End Date Taking? Authorizing Provider  busPIRone (BUSPAR) 7.5 MG tablet Take 1 tablet (7.5 mg total)  by mouth 3 (three) times daily. 01/02/15   Thermon Leyland, NP  docusate sodium (COLACE) 100 MG capsule Take 100 mg by mouth 2 (two) times daily as needed for mild constipation.     Historical Provider, MD  furosemide (LASIX) 20 MG tablet Take 1 tablet (20 mg total) by mouth daily. 01/02/15   Thermon Leyland, NP  gabapentin (NEURONTIN) 400 MG capsule Take 2 capsules (800 mg total) by mouth 3 (three) times daily. 01/02/15   Thermon Leyland, NP  methadone (DOLOPHINE) 10 MG/ML solution Take 9.9 mLs (99 mg total) by mouth daily. 01/02/15   Thermon Leyland, NP  nicotine (NICODERM CQ - DOSED IN MG/24 HOURS) 21 mg/24hr patch Place 1 patch (21 mg total) onto the skin daily. 01/02/15   Thermon Leyland, NP  omeprazole (PRILOSEC) 20 MG capsule Take 1 capsule (20 mg total) by mouth daily. 01/02/15   Thermon Leyland, NP  OXcarbazepine (TRILEPTAL) 150 MG tablet Take 1 tablet (150 mg total) by mouth 3 (three) times daily. 01/02/15   Thermon Leyland, NP  potassium chloride (K-DUR,KLOR-CON) 10 MEQ tablet Take 1 tablet (10 mEq total) by mouth daily. To prevent low potassium from diuretic usage. 01/02/15   Thermon Leyland, NP  traZODone (DESYREL) 50 MG tablet Take 1 tablet (50 mg total) by  mouth at bedtime. 01/02/15   Thermon Leyland, NP   BP 123/85 mmHg  Pulse 67  Temp(Src) 98.1 F (36.7 C) (Oral)  Resp 20  Ht 6' (1.829 m)  Wt 292 lb (132.45 kg)  BMI 39.59 kg/m2  SpO2 99% Physical Exam  Constitutional: He is oriented to person, place, and time. He appears well-developed and well-nourished. No distress.  Obese  HENT:  Head: Normocephalic and atraumatic.  Mouth/Throat: Oropharynx is clear and moist.  Eyes: Conjunctivae and EOM are normal. Pupils are equal, round, and reactive to light.  Neck: Normal range of motion.  Cardiovascular: Normal rate, regular rhythm and intact distal pulses.   Pulmonary/Chest: Effort normal and breath sounds normal. No stridor. No respiratory distress. He has no wheezes. He has no rales. He exhibits no  tenderness.  Abdominal: Soft. Bowel sounds are normal. He exhibits no distension and no mass. There is no tenderness. There is no rebound and no guarding.  Musculoskeletal: Normal range of motion.  Remote well-healed lumbar surgical scar  Neurological: He is alert and oriented to person, place, and time.  No point tenderness to percussion of lumbar spinal processes.  No TTP or paraspinal muscular spasm. Strength is 5 out of 5 to bilateral lower extremities at hip and knee; extensor hallucis longus 5 out of 5. Ankle strength 5 out of 5, no clonus, neurovascularly intact. No saddle anaesthesia. Patellar reflexes are 2+ bilaterally.      Psychiatric: He has a normal mood and affect.  Nursing note and vitals reviewed.   ED Course  Procedures (including critical care time) Labs Review Labs Reviewed - No data to display  Imaging Review No results found.   EKG Interpretation None      MDM   Final diagnoses:  Lumbar radicular pain    Filed Vitals:   02/02/15 1947  BP: 123/85  Pulse: 67  Temp: 98.1 F (36.7 C)  TempSrc: Oral  Resp: 20  Height: 6' (1.829 m)  Weight: 292 lb (132.45 kg)  SpO2: 99%    Medications  morphine 4 MG/ML injection 4 mg (not administered)  predniSONE (DELTASONE) tablet 60 mg (not administered)  cyclobenzaprine (FLEXERIL) tablet 10 mg (not administered)    Llewyn Heap is a pleasant 45 y.o. male presenting with low back pain and hyperesthesia.  back pain.  No neurological deficits and normal neuro exam.  Patient can walk but states is painful.  No loss of bowel or bladder control.  No concern for cauda equina.  No fever, night sweats, weight loss, h/o cancer, IVDU.  RICE protocol and pain medicine indicated and discussed with patient. Visine to follow closely with his neurosurgeon at [facility].  Evaluation does not show pathology that would require ongoing emergent intervention or inpatient treatment. Pt is hemodynamically stable and mentating  appropriately. Discussed findings and plan with patient/guardian, who agrees with care plan. All questions answered. Return precautions discussed and outpatient follow up given.   New Prescriptions   CYCLOBENZAPRINE (FLEXERIL) 10 MG TABLET    Take 1 tablet (10 mg total) by mouth 2 (two) times daily as needed for muscle spasms.   HYDROCODONE-ACETAMINOPHEN (NORCO/VICODIN) 5-325 MG PER TABLET    Take 1-2 tablets by mouth every 6 hours as needed for pain and/or cough.   PREDNISONE (DELTASONE) 20 MG TABLET    Take 2 tablets (40 mg total) by mouth daily.         Wynetta Emery, PA-C 02/02/15 2310  Tilden Fossa, MD 02/02/15 [id number]

## 2015-02-02 NOTE — Discharge Instructions (Signed)
Take vicodin for breakthrough pain, do not drink alcohol, drive, care for children or do other critical tasks while taking vicodin.   For breakthrough pain you may take Flexeril. Do not drink alcohol, drive or operate heavy machinery when taking Flexeril.  Please follow with your primary care doctor in the next 2 days for a check-up. They must obtain records for further management.   Do not hesitate to return to the Emergency Department for any new, worsening or concerning symptoms.    Back Pain, Adult Back pain is very common. The pain often gets better over time. The cause of back pain is usually not dangerous. Most people can learn to manage their back pain on their own.  HOME CARE   Stay active. Start with short walks on flat ground if you can. Try to walk farther each day.  Do not sit, drive, or stand in one place for more than 30 minutes. Do not stay in bed.  Do not avoid exercise or work. Activity can help your back heal faster.  Be careful when you bend or lift an object. Bend at your knees, keep the object close to you, and do not twist.  Sleep on a firm mattress. Lie on your side, and bend your knees. If you lie on your back, put a pillow under your knees.  Only take medicines as told by your doctor.  Put ice on the injured area.  Put ice in a plastic bag.  Place a towel between your skin and the bag.  Leave the ice on for 15-20 minutes, 03-04 times a day for the first 2 to 3 days. After that, you can switch between ice and heat packs.  Ask your doctor about back exercises or massage.  Avoid feeling anxious or stressed. Find good ways to deal with stress, such as exercise. GET HELP RIGHT AWAY IF:   Your pain does not go away with rest or medicine.  Your pain does not go away in 1 week.  You have new problems.  You do not feel well.  The pain spreads into your legs.  You cannot control when you poop (bowel movement) or pee (urinate).  Your arms or legs feel  weak or lose feeling (numbness).  You feel sick to your stomach (nauseous) or throw up (vomit).  You have belly (abdominal) pain.  You feel like you may pass out (faint). MAKE SURE YOU:   Understand these instructions.  Will watch your condition.  Will get help right away if you are not doing well or get worse. Document Released: 01/12/2008 Document Revised: 10/18/2011 Document Reviewed: 11/27/2013 [facility] Patient Information 2015 [place], [place]. This information is not intended to replace advice given to you by your health care provider. Make sure you discuss any questions you have with your health care provider.

## 2015-02-02 NOTE — ED Notes (Signed)
Pt in c/o worsening lower back and R leg pain, never dx with sciatica, but hx of lumbar surgery.

## 2015-02-16 ENCOUNTER — Encounter (HOSPITAL_BASED_OUTPATIENT_CLINIC_OR_DEPARTMENT_OTHER): Payer: Self-pay | Admitting: *Deleted

## 2015-02-16 ENCOUNTER — Emergency Department (HOSPITAL_BASED_OUTPATIENT_CLINIC_OR_DEPARTMENT_OTHER): Payer: Medicare Other

## 2015-02-16 ENCOUNTER — Ambulatory Visit (HOSPITAL_BASED_OUTPATIENT_CLINIC_OR_DEPARTMENT_OTHER): Payer: Self-pay

## 2015-02-16 ENCOUNTER — Emergency Department (HOSPITAL_BASED_OUTPATIENT_CLINIC_OR_DEPARTMENT_OTHER)
Admission: EM | Admit: 2015-02-16 | Discharge: 2015-02-16 | Discharge status: Against Medical Advice | Payer: Medicare Other | Attending: Emergency Medicine | Admitting: Emergency Medicine

## 2015-02-16 DIAGNOSIS — G8929 Other chronic pain: Secondary | ICD-10-CM | POA: Insufficient documentation

## 2015-02-16 DIAGNOSIS — Z7952 Long term (current) use of systemic steroids: Secondary | ICD-10-CM | POA: Diagnosis not present

## 2015-02-16 DIAGNOSIS — Z720 Tobacco use: Secondary | ICD-10-CM | POA: Diagnosis not present

## 2015-02-16 DIAGNOSIS — F329 Major depressive disorder, single episode, unspecified: Secondary | ICD-10-CM | POA: Insufficient documentation

## 2015-02-16 DIAGNOSIS — Z79899 Other long term (current) drug therapy: Secondary | ICD-10-CM | POA: Diagnosis not present

## 2015-02-16 DIAGNOSIS — Z7289 Other problems related to lifestyle: Secondary | ICD-10-CM | POA: Diagnosis not present

## 2015-02-16 DIAGNOSIS — W108XXA Fall (on) (from) other stairs and steps, initial encounter: Secondary | ICD-10-CM | POA: Diagnosis not present

## 2015-02-16 DIAGNOSIS — Y9289 Other specified places as the place of occurrence of the external cause: Secondary | ICD-10-CM | POA: Diagnosis not present

## 2015-02-16 DIAGNOSIS — Z765 Malingerer [conscious simulation]: Secondary | ICD-10-CM | POA: Diagnosis present

## 2015-02-16 DIAGNOSIS — Y998 Other external cause status: Secondary | ICD-10-CM | POA: Insufficient documentation

## 2015-02-16 DIAGNOSIS — S29092A Other injury of muscle and tendon of back wall of thorax, initial encounter: Secondary | ICD-10-CM | POA: Insufficient documentation

## 2015-02-16 DIAGNOSIS — K219 Gastro-esophageal reflux disease without esophagitis: Secondary | ICD-10-CM | POA: Diagnosis not present

## 2015-02-16 DIAGNOSIS — G40909 Epilepsy, unspecified, not intractable, without status epilepticus: Secondary | ICD-10-CM | POA: Insufficient documentation

## 2015-02-16 DIAGNOSIS — F419 Anxiety disorder, unspecified: Secondary | ICD-10-CM | POA: Diagnosis not present

## 2015-02-16 DIAGNOSIS — Z915 Personal history of self-harm: Secondary | ICD-10-CM | POA: Insufficient documentation

## 2015-02-16 DIAGNOSIS — Z043 Encounter for examination and observation following other accident: Secondary | ICD-10-CM | POA: Diagnosis present

## 2015-02-16 DIAGNOSIS — Y9389 Activity, other specified: Secondary | ICD-10-CM | POA: Diagnosis not present

## 2015-02-16 DIAGNOSIS — I252 Old myocardial infarction: Secondary | ICD-10-CM | POA: Diagnosis not present

## 2015-02-16 NOTE — ED Notes (Addendum)
In to inform pt that x-rays had been ordered, room was empty and gown on bed, checked with registration who stated pt walked out. Registration stated pt told them that he had fallen onto a small table this am, told this RN he fell down stairs. Checked with EDP, who stated he denied drug use, when confronted by EDP, pt left without any further examination, treatment or x-rays, Charge RN aware of pts departure.

## 2015-02-16 NOTE — ED Notes (Signed)
Pt states fell this am at home, fell off steps, fell onto carpet, back pain

## 2015-02-16 NOTE — ED Provider Notes (Signed)
CSN: [id number]     Arrival date & time 02/16/15  [id number] History   First MD Initiated Contact with Patient 02/16/15 [phone]     Chief Complaint  Patient presents with  . Fall     (Consider location/radiation/quality/duration/timing/severity/associated sxs/prior Treatment) Patient is a 44 y.o. male presenting with fall. The history is provided by the patient.  Fall Pertinent negatives include no chest pain, no abdominal pain and no shortness of breath.   patient presents after reported fall down some stairs. States he landed flat on the back on the stairs. Patient had told triage that he tripped over some toys on the ground. Patient states he has had some diarrhea with the event and after. States he was unable to control. Has a history of back pain. Patient states that he wanted to be sure that methadone wasn't on his record anymore. He said he hasn't been on it for long time. Patient was told that he was on his record from behavioral health when he was therefore depression and opiate and cocaine abuse. Patient states he took himself off that. Patient states his legs hurt. States he is allergic to NSAIDs. States he needs something to help with the pain. No fevers. Denies drug abuse. States no history of opiate abuse that he was just on methadone for pain.  Past Medical History  Diagnosis Date  . Back pain   . Seizures   . History of ETOH abuse   . Bipolar 1 disorder   . Myocardial infarction   . Anxiety   . Depression   . GERD (gastroesophageal reflux disease)   . Chronic back pain   . Opioid dependence    Past Surgical History  Procedure Laterality Date  . Back surgery    . Appendectomy    . Abdominal surgery     Family History  Problem Relation Age of Onset  . Hypertension Mother   . CAD Father   . COPD Father   . Stroke Father   . Testicular cancer Brother    History  Substance Use Topics  . Smoking status: Heavy Tobacco Smoker -- 1.50 packs/day    Types: Cigarettes  .  Smokeless tobacco: Never Used  . Alcohol Use: No     Comment: last drink over 1 year ago    Review of Systems  Constitutional: Negative for chills.  Respiratory: Negative for shortness of breath.   Cardiovascular: Negative for chest pain.  Gastrointestinal: Negative for abdominal pain.  Genitourinary: Negative for flank pain.  Musculoskeletal: Positive for back pain and gait problem.  Skin: Negative for wound.  Neurological: Positive for weakness.      Allergies  Benadryl; Celecoxib; Prednisone; Risperidone and related; Rofecoxib; and Nsaids  Home Medications   Prior to Admission medications   Medication Sig Start Date End Date Taking? Authorizing Provider  busPIRone (BUSPAR) 7.5 MG tablet Take 1 tablet (7.5 mg total) by mouth 3 (three) times daily. 01/02/15   Thermon LeylandLaura A Davis, NP  cyclobenzaprine (FLEXERIL) 10 MG tablet Take 1 tablet (10 mg total) by mouth 2 (two) times daily as needed for muscle spasms. 02/02/15   Nicole Pisciotta, PA-C  docusate sodium (COLACE) 100 MG capsule Take 100 mg by mouth 2 (two) times daily as needed for mild constipation.     Historical Provider, MD  furosemide (LASIX) 20 MG tablet Take 1 tablet (20 mg total) by mouth daily. 01/02/15   Thermon LeylandLaura A Davis, NP  gabapentin (NEURONTIN) 400 MG capsule Take 2 capsules (800  mg total) by mouth 3 (three) times daily. 01/02/15   Thermon Leyland, NP  HYDROcodone-acetaminophen (NORCO/VICODIN) 5-325 MG per tablet Take 1-2 tablets by mouth every 6 hours as needed for pain and/or cough. 02/02/15   Nicole Pisciotta, PA-C  methadone (DOLOPHINE) 10 MG/ML solution Take 9.9 mLs (99 mg total) by mouth daily. 01/02/15   Thermon Leyland, NP  nicotine (NICODERM CQ - DOSED IN MG/24 HOURS) 21 mg/24hr patch Place 1 patch (21 mg total) onto the skin daily. 01/02/15   Thermon Leyland, NP  omeprazole (PRILOSEC) 20 MG capsule Take 1 capsule (20 mg total) by mouth daily. 01/02/15   Thermon Leyland, NP  OXcarbazepine (TRILEPTAL) 150 MG tablet Take 1 tablet  (150 mg total) by mouth 3 (three) times daily. 01/02/15   Thermon Leyland, NP  potassium chloride (K-DUR,KLOR-CON) 10 MEQ tablet Take 1 tablet (10 mEq total) by mouth daily. To prevent low potassium from diuretic usage. 01/02/15   Thermon Leyland, NP  predniSONE (DELTASONE) 20 MG tablet Take 2 tablets (40 mg total) by mouth daily. 02/02/15   Nicole Pisciotta, PA-C  traZODone (DESYREL) 50 MG tablet Take 1 tablet (50 mg total) by mouth at bedtime. 01/02/15   Thermon Leyland, NP   BP 138/82 mmHg  Pulse 70  Temp(Src) 98.2 F (36.8 C) (Oral)  Resp 24  Ht 6' (1.829 m)  Wt 290 lb (131.543 kg)  BMI 39.32 kg/m2  SpO2 99% Physical Exam  Constitutional: He appears well-developed.  HENT:  Head: Atraumatic.  Cardiovascular: Normal rate and regular rhythm.   Pulmonary/Chest: Effort normal.  Abdominal: Soft.  Musculoskeletal: He exhibits tenderness.  Tenderness over the lower thoracic spine. No step-off deformity. No lumbar tenderness. Good flexion-extension ankles.  Neurological: He is alert.  Skin: Skin is warm.   perineal sensation intact  ED Course  Procedures (including critical care time) Labs Review Labs Reviewed - No data to display  Imaging Review No results found.   EKG Interpretation None      MDM   Final diagnoses:  Drug-seeking behavior    Patient presented with back pain. Reported fell down stairs. Records reviewed and patient has recent mission to [facility] behavioral health for substance abuse including cocaine and also been on methadone. Outside records reviewed and showed that he was discharged from [facility] yesterday for attempted suicide attempt and heroin abuse. In conjunction with this fact patient states he was depressed but no heroin abuse he showed me his forearm showed there was no tract versus said he doesn't use heroin. He had already been informed he does not get narcotics. He was told we're going to start with x-rays to evaluate his back pain along with  the red flag symptoms he had said. Reviewing the records at [facility] states he used meth medical terms and attempt to get narcotics. They thought he was attempting to psychiatric disorder to get pain meds. Patient eloped from the ER without informing staff after his dissection had been identified.   Benjiman Core, MD 02/16/15 [phone]

## 2015-02-16 NOTE — ED Notes (Signed)
MD at bedside. 

## 2015-02-23 ENCOUNTER — Encounter: Payer: Self-pay | Admitting: Emergency Medicine

## 2015-02-23 ENCOUNTER — Emergency Department
Admission: EM | Admit: 2015-02-23 | Discharge: 2015-02-25 | Disposition: A | Discharge status: Home / Self Care | Payer: Medicare Other | Attending: Emergency Medicine | Admitting: Emergency Medicine

## 2015-02-23 DIAGNOSIS — M549 Dorsalgia, unspecified: Secondary | ICD-10-CM | POA: Diagnosis present

## 2015-02-23 DIAGNOSIS — G894 Chronic pain syndrome: Secondary | ICD-10-CM | POA: Diagnosis not present

## 2015-02-23 DIAGNOSIS — F1410 Cocaine abuse, uncomplicated: Secondary | ICD-10-CM | POA: Insufficient documentation

## 2015-02-23 DIAGNOSIS — F329 Major depressive disorder, single episode, unspecified: Secondary | ICD-10-CM | POA: Diagnosis present

## 2015-02-23 DIAGNOSIS — M5430 Sciatica, unspecified side: Secondary | ICD-10-CM | POA: Diagnosis not present

## 2015-02-23 DIAGNOSIS — F4310 Post-traumatic stress disorder, unspecified: Secondary | ICD-10-CM | POA: Diagnosis present

## 2015-02-23 DIAGNOSIS — Z7952 Long term (current) use of systemic steroids: Secondary | ICD-10-CM | POA: Diagnosis not present

## 2015-02-23 DIAGNOSIS — F1120 Opioid dependence, uncomplicated: Secondary | ICD-10-CM | POA: Insufficient documentation

## 2015-02-23 DIAGNOSIS — Z720 Tobacco use: Secondary | ICD-10-CM | POA: Insufficient documentation

## 2015-02-23 DIAGNOSIS — Z79899 Other long term (current) drug therapy: Secondary | ICD-10-CM | POA: Diagnosis not present

## 2015-02-23 DIAGNOSIS — F1310 Sedative, hypnotic or anxiolytic abuse, uncomplicated: Secondary | ICD-10-CM | POA: Insufficient documentation

## 2015-02-23 DIAGNOSIS — F319 Bipolar disorder, unspecified: Secondary | ICD-10-CM | POA: Diagnosis not present

## 2015-02-23 DIAGNOSIS — M5431 Sciatica, right side: Secondary | ICD-10-CM

## 2015-02-23 MED ORDER — HYDROMORPHONE HCL 1 MG/ML IJ SOLN
2.0000 mg | INTRAMUSCULAR | Status: AC
  Administered 2015-02-23: 2 mg via INTRAMUSCULAR
  Filled 2015-02-23: qty 2

## 2015-02-23 MED ORDER — ONDANSETRON 8 MG PO TBDP
8.0000 mg | ORAL_TABLET | Freq: Once | ORAL | Status: AC
  Administered 2015-02-23: 8 mg via ORAL
  Filled 2015-02-23: qty 1

## 2015-02-23 MED ORDER — ONDANSETRON 8 MG PO TBDP
8.0000 mg | ORAL_TABLET | Freq: Four times a day (QID) | ORAL | Status: DC | PRN
  Administered 2015-02-24 – 2015-02-25 (×2): 8 mg via ORAL
  Filled 2015-02-23: qty 1

## 2015-02-23 MED ORDER — OXYCODONE-ACETAMINOPHEN 5-325 MG PO TABS
1.0000 | ORAL_TABLET | Freq: Once | ORAL | Status: AC
  Administered 2015-02-24: 1 via ORAL
  Filled 2015-02-23: qty 1

## 2015-02-23 MED ORDER — LOPERAMIDE HCL 2 MG PO TABS: 4.0000 mg | ORAL_TABLET | Freq: Four times a day (QID) | ORAL | Status: DC | PRN

## 2015-02-23 MED ORDER — LOPERAMIDE HCL 2 MG PO CAPS
4.0000 mg | ORAL_CAPSULE | Freq: Four times a day (QID) | ORAL | Status: DC | PRN
  Administered 2015-02-24: 4 mg via ORAL
  Filled 2015-02-23: qty 2

## 2015-02-23 MED ORDER — ONDANSETRON 8 MG PO TBDP: 8.0000 mg | ORAL_TABLET | Freq: Three times a day (TID) | ORAL | Status: DC | PRN
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
## 2015-02-23 NOTE — [facility] Counselor (Signed)
Patient was initially going to be discharge from the ER. However, after gathering further collateral information and review of the patient's chart. He is to remain in the ER and to be seen by on call psychiatrist. His history of suicide attempts, history of being mentally unstable and history of unpredictable behaviors and poor impulse control.

## 2015-02-23 NOTE — ED Notes (Signed)

## 2015-02-23 NOTE — ED Notes (Signed)
BEHAVIORAL HEALTH ROUNDING Patient sleeping: No. Patient alert and oriented: yes Behavior appropriate: Yes.  ; If no, describe:  Nutrition and fluids offered: Yes  Toileting and hygiene offered: Yes  Sitter present: yes Law enforcement present: Yes  

## 2015-02-23 NOTE — ED Notes (Signed)
Temp label  Printed for [employer]

## 2015-02-23 NOTE — Discharge Instructions (Signed)
Finding Treatment for Alcohol and Drug Addiction It can be hard to find the right place to get professional treatment. Here are some important things to consider:  There are different types of treatment to choose from.  Some programs are live-in (residential) while others are not (outpatient). Sometimes a combination is offered.  No single type of program is right for everyone.  Most treatment programs involve a combination of education, counseling, and a 12-step, spiritually-based approach.  There are non-spiritually based programs (not 12-step).  Some treatment programs are government sponsored. They are geared for patients without private insurance.  Treatment programs can vary in many respects such as:  Cost and types of insurance accepted.  Types of on-site medical services offered.  Length of stay, setting, and size.  Overall philosophy of treatment. A person may need specialized treatment or have needs not addressed by all programs. For example, adolescents need treatment appropriate for their age. Other people have secondary disorders that must be managed as well. Secondary conditions can include mental illness, such as depression or diabetes. Often, a period of detoxification from alcohol or drugs is needed. This requires medical supervision and not all programs offer this. THINGS TO CONSIDER WHEN SELECTING A TREATMENT PROGRAM   Is the program certified by the appropriate government agency? Even private programs must be certified and employ certified professionals.  Does the program accept your insurance? If not, can a payment plan be set up?  Is the facility clean, organized, and well run? Do they allow you to speak with graduates who can share their treatment experience with you? Can you tour the facility? Can you meet with staff?  Does the program meet the full range of individual needs?  Does the treatment program address sexual orientation and physical disabilities?  Do they provide age, gender, and culturally appropriate treatment services?  Is treatment available in languages other than English?  Is long-term aftercare support or guidance encouraged and provided?  Is assessment of an individual's treatment plan ongoing to ensure it meets changing needs?  Does the program use strategies to encourage reluctant patients to remain in treatment long enough to increase the likelihood of success?  Does the program offer counseling (individual or group) and other behavioral therapies?  Does the program offer medicine as part of the treatment regimen, if needed?  Is there ongoing monitoring of possible relapse? Is there a defined relapse prevention program? Are services or referrals offered to family members to ensure they understand addiction and the recovery process? This would help them support the recovering individual.  Are 12-step meetings held at the center or is transport available for patients to attend outside meetings? In countries outside of the [place]. and [place], Magazine features editor for contact information for services in your area. Document Released: 06/24/2005 Document Revised: 10/18/2011 Document Reviewed: 01/04/2008 [facility] Patient Information 2015 [place], [place]. This information is not intended to replace advice given to you by your health care provider. Make sure you discuss any questions you have with your health care provider.  Chronic Back Pain  When back pain lasts longer than 3 months, it is called chronic back pain.People with chronic back pain often go through certain periods that are more intense (flare-ups).  CAUSES Chronic back pain can be caused by wear and tear (degeneration) on different structures in your back. These structures include:  The bones of your spine (vertebrae) and the joints surrounding your spinal cord and nerve roots (facets).  The strong, fibrous tissues that connect  your vertebrae  (ligaments). Degeneration of these structures may result in pressure on your nerves. This can lead to constant pain. HOME CARE INSTRUCTIONS  Avoid bending, heavy lifting, prolonged sitting, and activities which make the problem worse.  Take brief periods of rest throughout the day to reduce your pain. Lying down or standing usually is better than sitting while you are resting.  Take over-the-counter or prescription medicines only as directed by your caregiver. SEEK IMMEDIATE MEDICAL CARE IF:   You have weakness or numbness in one of your legs or feet.  You have trouble controlling your bladder or bowels.  You have nausea, vomiting, abdominal pain, shortness of breath, or fainting. Document Released: 09/02/2004 Document Revised: 10/18/2011 Document Reviewed: 07/10/2011 [facility] Patient Information 2015 [place], [place]. This information is not intended to replace advice given to you by your health care provider. Make sure you discuss any questions you have with your health care provider.  Chronic Pain Chronic pain can be defined as pain that is off and on and lasts for 3-6 months or longer. Many things cause chronic pain, which can make it difficult to make a diagnosis. There are many treatment options available for chronic pain. However, finding a treatment that works well for you may require trying various approaches until the right one is found. Many people benefit from a combination of two or more types of treatment to control their pain. SYMPTOMS  Chronic pain can occur anywhere in the body and can range from mild to very severe. Some types of chronic pain include:  Headache.  Low back pain.  Cancer pain.  Arthritis pain.  Neurogenic pain. This is pain resulting from damage to nerves. People with chronic pain may also have other symptoms such as:  Depression.  Anger.  Insomnia.  Anxiety. DIAGNOSIS  Your health care provider will help diagnose your condition over time.  In many cases, the initial focus will be on excluding possible conditions that could be causing the pain. Depending on your symptoms, your health care provider may order tests to diagnose your condition. Some of these tests may include:   Blood tests.   CT scan.   MRI.   X-rays.   Ultrasounds.   Nerve conduction studies.  You may need to see a specialist.  TREATMENT  Finding treatment that works well may take time. You may be referred to a pain specialist. He or she may prescribe medicine or therapies, such as:   Mindful meditation or yoga.  Shots (injections) of numbing or pain-relieving medicines into the spine or area of pain.  Local electrical stimulation.  Acupuncture.   Massage therapy.   Aroma, color, light, or sound therapy.   Biofeedback.   Working with a physical therapist to keep from getting stiff.   Regular, gentle exercise.   Cognitive or behavioral therapy.   Group support.  Sometimes, surgery may be recommended.  HOME CARE INSTRUCTIONS   Take all medicines as directed by your health care provider.   Lessen stress in your life by relaxing and doing things such as listening to calming music.   Exercise or be active as directed by your health care provider.   Eat a healthy diet and include things such as vegetables, fruits, fish, and lean meats in your diet.   Keep all follow-up appointments with your health care provider.   Attend a support group with others suffering from chronic pain. SEEK MEDICAL CARE IF:   Your pain gets worse.   You develop a  new pain that was not there before.   You cannot tolerate medicines given to you by your health care provider.   You have new symptoms since your last visit with your health care provider.  SEEK IMMEDIATE MEDICAL CARE IF:   You feel weak.   You have decreased sensation or numbness.   You lose control of bowel or bladder function.   Your pain suddenly gets much  worse.   You develop shaking.  You develop chills.  You develop confusion.  You develop chest pain.  You develop shortness of breath.  MAKE SURE YOU:  Understand these instructions.  Will watch your condition.  Will get help right away if you are not doing well or get worse. Document Released: 04/17/2002 Document Revised: 03/28/2013 Document Reviewed: 01/19/2013 [facility] Patient Information 2015 [place]. This information is not intended to replace advice given to you by your health care provider. Make sure you discuss any questions you have with your health care provider.  Opioid Withdrawal Opioids are a group of narcotic drugs. They include the street drug heroin. They also include pain medicines, such as morphine, hydrocodone, oxycodone, and fentanyl. Opioid withdrawal is a group of characteristic physical and mental signs and symptoms. It typically occurs if you have been using opioids daily for several weeks or longer and stop using or rapidly decrease use. Opioid withdrawal can also occur if you have used opioids daily for a long time and are given a medicine to block the effect.  SIGNS AND SYMPTOMS Opioid withdrawal includes three or more of the following symptoms:   Depressed, anxious, or irritable mood.  Nausea or vomiting.  Muscle aches or spasms.   Watery eyes.   Runny nose.  Dilated pupils, sweating, or hairs standing on end.  Diarrhea or intestinal cramping.  Yawning.   Fever.  Increased blood pressure.  Fast pulse.  Restlessness or trouble sleeping. These signs and symptoms occur within several hours of stopping or reducing short-acting opioids, such as heroin. They can occur within 3 days of stopping or reducing long-acting opioids, such as methadone. Withdrawal begins within minutes of receiving a drug that blocks the effects of opioids, such as naltrexone or naloxone. DIAGNOSIS  Opioid use disorder is diagnosed by your health care  provider. You will be asked about your symptoms, drug and alcohol use, medical history, and use of medicines. A physical exam may be done. Lab tests may be ordered. Your health care provider may have you see a mental health professional.  TREATMENT  The treatment for opioid withdrawal is usually provided by medical doctors with special training in substance use disorders (addiction specialists). The following medicines may be included in treatment:  Opioids given in place of the abused opioid. They turn on opioid receptors in the brain and lessen or prevent withdrawal symptoms. They are gradually decreased (opioid substitution and taper).  Non-opioids that can lessen certain opioid withdrawal symptoms. They may be used alone or with opioid substitution and taper. Successful long-term recovery usually requires medicine, counseling, and group support. HOME CARE INSTRUCTIONS   Take medicines only as directed by your health care provider.  Check with your health care provider before starting new medicines.  Keep all follow-up visits as directed by your health care provider. SEEK MEDICAL CARE IF:  You are not able to take your medicines as directed.  Your symptoms get worse.  You relapse. SEEK IMMEDIATE MEDICAL CARE IF:  You have serious thoughts about hurting yourself or others.  You  have a seizure.  You lose consciousness. Document Released: 07/29/2003 Document Revised: 12/10/2013 Document Reviewed: 08/08/2013 [facility] Patient Information 2015 [place], [place]. This information is not intended to replace advice given to you by your health care provider. Make sure you discuss any questions you have with your health care provider.  Sciatica with Rehab The sciatic nerve runs from the back down the leg and is responsible for sensation and control of the muscles in the back (posterior) side of the thigh, lower leg, and foot. Sciatica is a condition that is characterized by inflammation of  this nerve.  SYMPTOMS   Signs of nerve damage, including numbness and/or weakness along the posterior side of the lower extremity.  Pain in the back of the thigh that may also travel down the leg.  Pain that worsens when sitting for long periods of time.  Occasionally, pain in the back or buttock. CAUSES  Inflammation of the sciatic nerve is the cause of sciatica. The inflammation is due to something irritating the nerve. Common sources of irritation include:  Sitting for long periods of time.  Direct trauma to the nerve.  Arthritis of the spine.  Herniated or ruptured disk.  Slipping of the vertebrae (spondylolisthesis).  Pressure from soft tissues, such as muscles or ligament-like tissue (fascia). RISK INCREASES WITH:  Sports that place pressure or stress on the spine (football or weightlifting).  Poor strength and flexibility.  Failure to warm up properly before activity.  Family history of low back pain or disk disorders.  Previous back injury or surgery.  Poor body mechanics, especially when lifting, or poor posture. PREVENTION   Warm up and stretch properly before activity.  Maintain physical fitness:  Strength, flexibility, and endurance.  Cardiovascular fitness.  Learn and use proper technique, especially with posture and lifting. When possible, have coach correct improper technique.  Avoid activities that place stress on the spine. PROGNOSIS If treated properly, then sciatica usually resolves within 6 weeks. However, occasionally surgery is necessary.  RELATED COMPLICATIONS   Permanent nerve damage, including pain, numbness, tingle, or weakness.  Chronic back pain.  Risks of surgery: infection, bleeding, nerve damage, or damage to surrounding tissues. TREATMENT Treatment initially involves resting from any activities that aggravate your symptoms. The use of ice and medication may help reduce pain and inflammation. The use of strengthening and  stretching exercises may help reduce pain with activity. These exercises may be performed at home or with referral to a therapist. A therapist may recommend further treatments, such as transcutaneous electronic nerve stimulation (TENS) or ultrasound. Your caregiver may recommend corticosteroid injections to help reduce inflammation of the sciatic nerve. If symptoms persist despite non-surgical (conservative) treatment, then surgery may be recommended. MEDICATION  If pain medication is necessary, then nonsteroidal anti-inflammatory medications, such as aspirin and ibuprofen, or other minor pain relievers, such as acetaminophen, are often recommended.  Do not take pain medication for 7 days before surgery.  Prescription pain relievers may be given if deemed necessary by your caregiver. Use only as directed and only as much as you need.  Ointments applied to the skin may be helpful.  Corticosteroid injections may be given by your caregiver. These injections should be reserved for the most serious cases, because they may only be given a certain number of times. HEAT AND COLD  Cold treatment (icing) relieves pain and reduces inflammation. Cold treatment should be applied for 10 to 15 minutes every 2 to 3 hours for inflammation and pain and immediately after any  activity that aggravates your symptoms. Use ice packs or massage the area with a piece of ice (ice massage).  Heat treatment may be used prior to performing the stretching and strengthening activities prescribed by your caregiver, physical therapist, or athletic trainer. Use a heat pack or soak the injury in warm water. SEEK MEDICAL CARE IF:  Treatment seems to offer no benefit, or the condition worsens.  Any medications produce adverse side effects. EXERCISES  RANGE OF MOTION (ROM) AND STRETCHING EXERCISES - Sciatica Most people with sciatic will find that their symptoms worsen with either excessive bending forward (flexion) or arching at  the low back (extension). The exercises which will help resolve your symptoms will focus on the opposite motion. Your physician, physical therapist or athletic trainer will help you determine which exercises will be most helpful to resolve your low back pain. Do not complete any exercises without first consulting with your clinician. Discontinue any exercises which worsen your symptoms until you speak to your clinician. If you have pain, numbness or tingling which travels down into your buttocks, leg or foot, the goal of the therapy is for these symptoms to move closer to your back and eventually resolve. Occasionally, these leg symptoms will get better, but your low back pain may worsen; this is typically an indication of progress in your rehabilitation. Be certain to be very alert to any changes in your symptoms and the activities in which you participated in the 24 hours prior to the change. Sharing this information with your clinician will allow him/her to most efficiently treat your condition. These exercises may help you when beginning to rehabilitate your injury. Your symptoms may resolve with or without further involvement from your physician, physical therapist or athletic trainer. While completing these exercises, remember:   Restoring tissue flexibility helps normal motion to return to the joints. This allows healthier, less painful movement and activity.  An effective stretch should be held for at least 30 seconds.  A stretch should never be painful. You should only feel a gentle lengthening or release in the stretched tissue. FLEXION RANGE OF MOTION AND STRETCHING EXERCISES: STRETCH - Flexion, Single Knee to Chest   Lie on a firm bed or floor with both legs extended in front of you.  Keeping one leg in contact with the floor, bring your opposite knee to your chest. Hold your leg in place by either grabbing behind your thigh or at your knee.  Pull until you feel a gentle stretch in your  low back. Hold __________ seconds.  Slowly release your grasp and repeat the exercise with the opposite side. Repeat __________ times. Complete this exercise __________ times per day.  STRETCH - Flexion, Double Knee to Chest  Lie on a firm bed or floor with both legs extended in front of you.  Keeping one leg in contact with the floor, bring your opposite knee to your chest.  Tense your stomach muscles to support your back and then lift your other knee to your chest. Hold your legs in place by either grabbing behind your thighs or at your knees.  Pull both knees toward your chest until you feel a gentle stretch in your low back. Hold __________ seconds.  Tense your stomach muscles and slowly return one leg at a time to the floor. Repeat __________ times. Complete this exercise __________ times per day.  STRETCH - Low Trunk Rotation   Lie on a firm bed or floor. Keeping your legs in front of you, bend  your knees so they are both pointed toward the ceiling and your feet are flat on the floor.  Extend your arms out to the side. This will stabilize your upper body by keeping your shoulders in contact with the floor.  Gently and slowly drop both knees together to one side until you feel a gentle stretch in your low back. Hold for __________ seconds.  Tense your stomach muscles to support your low back as you bring your knees back to the starting position. Repeat the exercise to the other side. Repeat __________ times. Complete this exercise __________ times per day  EXTENSION RANGE OF MOTION AND FLEXIBILITY EXERCISES: STRETCH - Extension, Prone on Elbows  Lie on your stomach on the floor, a bed will be too soft. Place your palms about shoulder width apart and at the height of your head.  Place your elbows under your shoulders. If this is too painful, stack pillows under your chest.  Allow your body to relax so that your hips drop lower and make contact more completely with the  floor.  Hold this position for __________ seconds.  Slowly return to lying flat on the floor. Repeat __________ times. Complete this exercise __________ times per day.  RANGE OF MOTION - Extension, Prone Press Ups  Lie on your stomach on the floor, a bed will be too soft. Place your palms about shoulder width apart and at the height of your head.  Keeping your back as relaxed as possible, slowly straighten your elbows while keeping your hips on the floor. You may adjust the placement of your hands to maximize your comfort. As you gain motion, your hands will come more underneath your shoulders.  Hold this position __________ seconds.  Slowly return to lying flat on the floor. Repeat __________ times. Complete this exercise __________ times per day.  STRENGTHENING EXERCISES - Sciatica  These exercises may help you when beginning to rehabilitate your injury. These exercises should be done near your "sweet spot." This is the neutral, low-back arch, somewhere between fully rounded and fully arched, that is your least painful position. When performed in this safe range of motion, these exercises can be used for people who have either a flexion or extension based injury. These exercises may resolve your symptoms with or without further involvement from your physician, physical therapist or athletic trainer. While completing these exercises, remember:   Muscles can gain both the endurance and the strength needed for everyday activities through controlled exercises.  Complete these exercises as instructed by your physician, physical therapist or athletic trainer. Progress with the resistance and repetition exercises only as your caregiver advises.  You may experience muscle soreness or fatigue, but the pain or discomfort you are trying to eliminate should never worsen during these exercises. If this pain does worsen, stop and make certain you are following the directions exactly. If the pain is still  present after adjustments, discontinue the exercise until you can discuss the trouble with your clinician. STRENGTHENING - Deep Abdominals, Pelvic Tilt   Lie on a firm bed or floor. Keeping your legs in front of you, bend your knees so they are both pointed toward the ceiling and your feet are flat on the floor.  Tense your lower abdominal muscles to press your low back into the floor. This motion will rotate your pelvis so that your tail bone is scooping upwards rather than pointing at your feet or into the floor.  With a gentle tension and even breathing, hold this  position for __________ seconds. Repeat __________ times. Complete this exercise __________ times per day.  STRENGTHENING - Abdominals, Crunches   Lie on a firm bed or floor. Keeping your legs in front of you, bend your knees so they are both pointed toward the ceiling and your feet are flat on the floor. Cross your arms over your chest.  Slightly tip your chin down without bending your neck.  Tense your abdominals and slowly lift your trunk high enough to just clear your shoulder blades. Lifting higher can put excessive stress on the low back and does not further strengthen your abdominal muscles.  Control your return to the starting position. Repeat __________ times. Complete this exercise __________ times per day.  STRENGTHENING - Quadruped, Opposite UE/LE Lift  Assume a hands and knees position on a firm surface. Keep your hands under your shoulders and your knees under your hips. You may place padding under your knees for comfort.  Find your neutral spine and gently tense your abdominal muscles so that you can maintain this position. Your shoulders and hips should form a rectangle that is parallel with the floor and is not twisted.  Keeping your trunk steady, lift your right hand no higher than your shoulder and then your left leg no higher than your hip. Make sure you are not holding your breath. Hold this position  __________ seconds.  Continuing to keep your abdominal muscles tense and your back steady, slowly return to your starting position. Repeat with the opposite arm and leg. Repeat __________ times. Complete this exercise __________ times per day.  STRENGTHENING - Abdominals and Quadriceps, Straight Leg Raise   Lie on a firm bed or floor with both legs extended in front of you.  Keeping one leg in contact with the floor, bend the other knee so that your foot can rest flat on the floor.  Find your neutral spine, and tense your abdominal muscles to maintain your spinal position throughout the exercise.  Slowly lift your straight leg off the floor about 6 inches for a count of 15, making sure to not hold your breath.  Still keeping your neutral spine, slowly lower your leg all the way to the floor. Repeat this exercise with each leg __________ times. Complete this exercise __________ times per day. POSTURE AND BODY MECHANICS CONSIDERATIONS - Sciatica Keeping correct posture when sitting, standing or completing your activities will reduce the stress put on different body tissues, allowing injured tissues a chance to heal and limiting painful experiences. The following are general guidelines for improved posture. Your physician or physical therapist will provide you with any instructions specific to your needs. While reading these guidelines, remember:  The exercises prescribed by your provider will help you have the flexibility and strength to maintain correct postures.  The correct posture provides the optimal environment for your joints to work. All of your joints have less wear and tear when properly supported by a spine with good posture. This means you will experience a healthier, less painful body.  Correct posture must be practiced with all of your activities, especially prolonged sitting and standing. Correct posture is as important when doing repetitive low-stress activities (typing) as it is  when doing a single heavy-load activity (lifting). RESTING POSITIONS Consider which positions are most painful for you when choosing a resting position. If you have pain with flexion-based activities (sitting, bending, stooping, squatting), choose a position that allows you to rest in a less flexed posture. You would want to avoid  curling into a fetal position on your side. If your pain worsens with extension-based activities (prolonged standing, working overhead), avoid resting in an extended position such as sleeping on your stomach. Most people will find more comfort when they rest with their spine in a more neutral position, neither too rounded nor too arched. Lying on a non-sagging bed on your side with a pillow between your knees, or on your back with a pillow under your knees will often provide some relief. Keep in mind, being in any one position for a prolonged period of time, no matter how correct your posture, can still lead to stiffness. PROPER SITTING POSTURE In order to minimize stress and discomfort on your spine, you must sit with correct posture Sitting with good posture should be effortless for a healthy body. Returning to good posture is a gradual process. Many people can work toward this most comfortably by using various supports until they have the flexibility and strength to maintain this posture on their own. When sitting with proper posture, your ears will fall over your shoulders and your shoulders will fall over your hips. You should use the back of the chair to support your upper back. Your low back will be in a neutral position, just slightly arched. You may place a small pillow or folded towel at the base of your low back for support.  When working at a desk, create an environment that supports good, upright posture. Without extra support, muscles fatigue and lead to excessive strain on joints and other tissues. Keep these recommendations in mind: CHAIR:   A chair should be able  to slide under your desk when your back makes contact with the back of the chair. This allows you to work closely.  The chair's height should allow your eyes to be level with the upper part of your monitor and your hands to be slightly lower than your elbows. BODY POSITION  Your feet should make contact with the floor. If this is not possible, use a foot rest.  Keep your ears over your shoulders. This will reduce stress on your neck and low back. INCORRECT SITTING POSTURES   If you are feeling tired and unable to assume a healthy sitting posture, do not slouch or slump. This puts excessive strain on your back tissues, causing more damage and pain. Healthier options include:  Using more support, like a lumbar pillow.  Switching tasks to something that requires you to be upright or walking.  Talking a brief walk.  Lying down to rest in a neutral-spine position. PROLONGED STANDING WHILE SLIGHTLY LEANING FORWARD  When completing a task that requires you to lean forward while standing in one place for a long time, place either foot up on a stationary 2-4 inch high object to help maintain the best posture. When both feet are on the ground, the low back tends to lose its slight inward curve. If this curve flattens (or becomes too large), then the back and your other joints will experience too much stress, fatigue more quickly and can cause pain.  CORRECT STANDING POSTURES Proper standing posture should be assumed with all daily activities, even if they only take a few moments, like when brushing your teeth. As in sitting, your ears should fall over your shoulders and your shoulders should fall over your hips. You should keep a slight tension in your abdominal muscles to brace your spine. Your tailbone should point down to the ground, not behind your body, resulting in an  over-extended swayback posture.  INCORRECT STANDING POSTURES  Common incorrect standing postures include a forward head, locked  knees and/or an excessive swayback. WALKING Walk with an upright posture. Your ears, shoulders and hips should all line-up. PROLONGED ACTIVITY IN A FLEXED POSITION When completing a task that requires you to bend forward at your waist or lean over a low surface, try to find a way to stabilize 3 of 4 of your limbs. You can place a hand or elbow on your thigh or rest a knee on the surface you are reaching across. This will provide you more stability so that your muscles do not fatigue as quickly. By keeping your knees relaxed, or slightly bent, you will also reduce stress across your low back. CORRECT LIFTING TECHNIQUES DO :   Assume a wide stance. This will provide you more stability and the opportunity to get as close as possible to the object which you are lifting.  Tense your abdominals to brace your spine; then bend at the knees and hips. Keeping your back locked in a neutral-spine position, lift using your leg muscles. Lift with your legs, keeping your back straight.  Test the weight of unknown objects before attempting to lift them.  Try to keep your elbows locked down at your sides in order get the best strength from your shoulders when carrying an object.  Always ask for help when lifting heavy or awkward objects. INCORRECT LIFTING TECHNIQUES DO NOT:   Lock your knees when lifting, even if it is a small object.  Bend and twist. Pivot at your feet or move your feet when needing to change directions.  Assume that you cannot safely pick up a paperclip without proper posture. Document Released: 07/26/2005 Document Revised: 12/10/2013 Document Reviewed: 11/07/2008 [facility] Patient Information 2015 [place], [place]. This information is not intended to replace advice given to you by your health care provider. Make sure you discuss any questions you have with your health care provider.

## 2015-02-23 NOTE — ED Notes (Signed)
[facility]

## 2015-02-23 NOTE — ED Provider Notes (Addendum)
[facility] Emergency Department Provider Note  ____________________________________________  Time seen: 5:30 PM  I have reviewed the triage vital signs and the nursing notes.   HISTORY  Chief Complaint Back Pain and Suicidal    HPI Scott [name] is a 44 y.o. male who complains of severe lower back pain radiating into his right leg. He notes that this is chronic pain is been going on for at least 10 years. He has been chronically on opioid medications, most recently hydrocodone up to 3 days ago but has run out. He states that he used to be on methadone the left. He notes that he has severe opioid withdrawal every time he stops taking opioids. Currently complains of severe pain as well as nausea and generalized abdominal discomfort and diarrhea. There are no new complaints other than exacerbation of the chronic pain. No new trauma or other injuries. No fever or injection drug use.  Denies suicidal ideation or homicidal ideation. No hallucinations. Reports that he did tell triage that he had suicidal ideation but that was just to try and better his chances of getting into a detox program.     Past Medical History  Diagnosis Date  . Back pain   . Seizures   . History of ETOH abuse   . Bipolar 1 disorder   . Myocardial infarction   . Anxiety   . Depression   . GERD (gastroesophageal reflux disease)   . Chronic back pain   . Opioid dependence     Patient Active Problem List   Diagnosis Date Noted  . Drug-seeking behavior 02/16/2015  . MDD (major depressive disorder) 12/26/2014  . PTSD (post-traumatic stress disorder) 12/26/2014  . Opioid type dependence, continuous 12/26/2014  . Cocaine abuse with cocaine-induced mood disorder 12/26/2014  . Major depressive disorder, recurrent episode, moderate   . Chest pain at rest 10/22/2013  . Recovering alcoholic in remission 10/22/2013  . Chronic pain syndrome 10/22/2013  . Seizure disorder 10/22/2013  . Bipolar  disorder, unspecified 10/22/2013  . Withdrawal symptoms, drug or narcotic 10/22/2013  . Chest pain 10/21/2013  . Leg swelling 10/21/2013    Past Surgical History  Procedure Laterality Date  . Back surgery    . Appendectomy    . Abdominal surgery      Current Outpatient Rx  Name  Route  Sig  Dispense  Refill  . busPIRone (BUSPAR) 7.5 MG tablet   Oral   Take 1 tablet (7.5 mg total) by mouth 3 (three) times daily.   90 tablet   0   . cyclobenzaprine (FLEXERIL) 10 MG tablet   Oral   Take 1 tablet (10 mg total) by mouth 2 (two) times daily as needed for muscle spasms.   11 tablet   0   . docusate sodium (COLACE) 100 MG capsule   Oral   Take 100 mg by mouth 2 (two) times daily as needed for mild constipation.          . furosemide (LASIX) 20 MG tablet   Oral   Take 1 tablet (20 mg total) by mouth daily.   30 tablet   0   . gabapentin (NEURONTIN) 400 MG capsule   Oral   Take 2 capsules (800 mg total) by mouth 3 (three) times daily.   180 capsule   0   . HYDROcodone-acetaminophen (NORCO/VICODIN) 5-325 MG per tablet      Take 1-2 tablets by mouth every 6 hours as needed for pain and/or cough.  7 tablet   0   . methadone (DOLOPHINE) 10 MG/ML solution   Oral   Take 9.9 mLs (99 mg total) by mouth daily.      0   . nicotine (NICODERM CQ - DOSED IN MG/24 HOURS) 21 mg/24hr patch   Transdermal   Place 1 patch (21 mg total) onto the skin daily.   28 patch   0   . omeprazole (PRILOSEC) 20 MG capsule   Oral   Take 1 capsule (20 mg total) by mouth daily.         . OXcarbazepine (TRILEPTAL) 150 MG tablet   Oral   Take 1 tablet (150 mg total) by mouth 3 (three) times daily.   90 tablet   0   . potassium chloride (K-DUR,KLOR-CON) 10 MEQ tablet   Oral   Take 1 tablet (10 mEq total) by mouth daily. To prevent low potassium from diuretic usage.   30 tablet   0   . predniSONE (DELTASONE) 20 MG tablet   Oral   Take 2 tablets (40 mg total) by mouth daily.   10  tablet   0   . traZODone (DESYREL) 50 MG tablet   Oral   Take 1 tablet (50 mg total) by mouth at bedtime.   30 tablet   0     Allergies Benadryl; Celecoxib; Prednisone; Risperidone and related; Rofecoxib; and Nsaids  Family History  Problem Relation Age of Onset  . Hypertension Mother   . CAD Father   . COPD Father   . Stroke Father   . Testicular cancer Brother     Social History History  Substance Use Topics  . Smoking status: Heavy Tobacco Smoker -- 1.50 packs/day    Types: Cigarettes  . Smokeless tobacco: Never Used  . Alcohol Use: No     Comment: last drink over 1 year ago    Review of Systems  Constitutional: No fever or chills. No weight changes Eyes:No blurry vision or double vision.  ENT: No sore throat. Cardiovascular: No chest pain. Respiratory: No dyspnea or cough. Gastrointestinal: Negative for abdominal pain, vomiting and diarrhea.  No BRBPR or melena. Genitourinary: Negative for dysuria, urinary retention, bloody urine, or difficulty urinating. Musculoskeletal: Chronic back pain  Skin: Negative for rash. Neurological: Negative for headaches, focal weakness or numbness. Psychiatric:No anxiety or depression.   Endocrine:No hot/cold intolerance, changes in energy, or sleep difficulty.  10-point ROS otherwise negative.  ____________________________________________   PHYSICAL EXAM:  VITAL SIGNS: ED Triage Vitals  Enc Vitals Group     BP 02/23/15 1639 131/77 mmHg     Pulse Rate 02/23/15 1639 73     Resp 02/23/15 1930 20     Temp 02/23/15 1639 99.1 F (37.3 C)     Temp Source 02/23/15 1639 Oral     SpO2 02/23/15 1639 94 %     Weight 02/23/15 1639 182 lb (82.555 kg)     Height 02/23/15 1639 6' (1.829 m)     Head Cir --      Peak Flow --      Pain Score 02/23/15 1640 9     Pain Loc --      Pain Edu? --      Excl. in GC? --      Constitutional: Alert and oriented. Well appearing and in no distress. Eyes: No scleral icterus. No  conjunctival pallor. PERRL. EOMI ENT   Head: Normocephalic and atraumatic.   Nose: No congestion/rhinnorhea. No septal hematoma  Mouth/Throat: MMM, no pharyngeal erythema. No peritonsillar mass. No uvula shift.   Neck: No stridor. No SubQ emphysema. No meningismus. Hematological/Lymphatic/Immunilogical: No cervical lymphadenopathy. Cardiovascular: RRR. Normal and symmetric distal pulses are present in all extremities. No murmurs, rubs, or gallops. Respiratory: Normal respiratory effort without tachypnea nor retractions. Breath sounds are clear and equal bilaterally. No wheezes/rales/rhonchi. Gastrointestinal: Soft and nontender. No distention. There is no CVA tenderness.  No rebound, rigidity, or guarding. Genitourinary: deferred Musculoskeletal: Nontender with normal range of motion in all extremities. No joint effusions.  No lower extremity tenderness.  No edema. There is a vertical scar over the lumbar spine. There is no spinal tenderness. Straight leg raise is positive at 15 on the right leg causing sciatica type pain in the right lower back down the right leg. Neurologic:   Normal speech and language.  CN 2-10 normal. Motor grossly intact. No pronator drift.  Normal gait. No gross focal neurologic deficits are appreciated.  Skin:  Skin is warm, dry and intact. No rash noted.  No petechiae, purpura, or bullae. Psychiatric: Mood and affect are normal. Speech and behavior are normal. Patient exhibits appropriate insight and judgment.  ____________________________________________    LABS (pertinent positives/negatives) (all labs ordered are listed, but only abnormal results are displayed) Labs Reviewed - No data to display ____________________________________________   EKG    ____________________________________________    RADIOLOGY    ____________________________________________   PROCEDURES  ____________________________________________   INITIAL  IMPRESSION / ASSESSMENT AND PLAN / ED COURSE  Pertinent labs & imaging results that were available during my care of the patient were reviewed by me and considered in my medical decision making (see chart for details).  Patient presents with exacerbation of chronic pain. He reports a desire to anterior in opioid detox and rehabilitation program. Given 2 mg of IM Dilaudid for relief of withdrawal symptoms at this time, and then we will continue to manage symptoms with Zofran and clonidine as needed. No evidence of safety risk to self or others, no criteria for involuntary commitment at this time. We will await evaluation by behavioral medicine intake coordination of care. ----------------------------------------- 9:13 PM on 02/23/2015 -----------------------------------------  Vitals remain stable in the ED. Patient rinse calm and comfortable. We'll inform patient that due to department policy we cannot prescribe opioids for chronic pain management. I'll prescribe him Zofran and loperamide for management of any withdrawal symptoms and encouraged him to follow up with [facility] and primary care regarding his chronic pain and opioid dependence. Review of controlled substance reporting system does reveal that he has obtained opioid prescriptions from multiple providers over last several months.  Low suspicion of acute spinal pathology such as abscess hematoma cauda equina or impingement. No evidence of acute fracture or infection.   ----------------------------------------- 11:36 PM on 02/23/2015 -----------------------------------------  Further history obtained from [facility] with behavioral medicine intake who contacted the patient's wife and reviewed records from the consistent. He notes that the patient has prior suicide attempt by attempted dislocation with car exhaust in a closed space. He has a history of PTSD and dissociative disorder, and the wife does still have concerns about the patient's safety  and suicidality. It appears that the patient has not been forthcoming with the degree of his suicidal intent, and we'll place him under involuntary commitment pending psychiatric evaluation in the morning. ____________________________________________   FINAL CLINICAL IMPRESSION(S) / ED DIAGNOSES  Final diagnoses:  Chronic pain associated with significant psychosocial dysfunction  Opioid type dependence, continuous  Sciatica associated with  disorder of lumbar spine, right      Sharman CheekPhillip Sharon Rubis, MD 02/23/15 2114  Sharman CheekPhillip Ashonte Angelucci, MD 02/23/15 [phone]

## 2015-02-23 NOTE — ED Notes (Signed)
Per intake pt will be staying over night (not released as earlier thought). Percocet was ordered for pt's discharge. Pt currently sleeping - hold for pt if pain returns

## 2015-02-23 NOTE — ED Notes (Signed)
Pt states that he has chronic back and leg pain and that he has begun to feel hopeless and is considering suicide to be out of pain. Denies a plan, but mentions hanging himself and running out into traffic. Pt states that he normally takes meds for anxiety and depression, but that he still feels hopeless. Pt states he has not taken anything for pain in 4 days. [facility] and he took it then but that he does not want to be on pain medication for the rest of his life. Pt is alert & oriented with warm, dry skin.

## 2015-02-24 DIAGNOSIS — F1120 Opioid dependence, uncomplicated: Secondary | ICD-10-CM

## 2015-02-24 LAB — URINALYSIS COMPLETE WITH MICROSCOPIC (ARMC ONLY)
Bacteria, UA: NONE SEEN
Bilirubin Urine: NEGATIVE
Glucose, UA: NEGATIVE mg/dL
Hgb urine dipstick: NEGATIVE
Ketones, ur: NEGATIVE mg/dL
Leukocytes, UA: NEGATIVE
Nitrite: NEGATIVE
Protein, ur: NEGATIVE mg/dL
Specific Gravity, Urine: 1.021 (ref 1.005–1.030)
pH: 6 (ref 5.0–8.0)

## 2015-02-24 LAB — URINE DRUG SCREEN, QUALITATIVE (ARMC ONLY)
Amphetamines, Ur Screen: NOT DETECTED
Barbiturates, Ur Screen: POSITIVE — AB
Benzodiazepine, Ur Scrn: POSITIVE — AB
Cannabinoid 50 Ng, Ur ~~LOC~~: NOT DETECTED
Cocaine Metabolite,Ur ~~LOC~~: POSITIVE — AB
MDMA (Ecstasy)Ur Screen: NOT DETECTED
Methadone Scn, Ur: NOT DETECTED
Opiate, Ur Screen: POSITIVE — AB
Phencyclidine (PCP) Ur S: NOT DETECTED
Tricyclic, Ur Screen: NOT DETECTED

## 2015-02-24 LAB — CBC WITH DIFFERENTIAL/PLATELET
Basophils Absolute: 0 10*3/uL (ref 0–0.1)
Basophils Relative: 0 %
Eosinophils Absolute: 0.2 10*3/uL (ref 0–0.7)
Eosinophils Relative: 2 %
HCT: 43.8 % (ref 40.0–52.0)
Hemoglobin: 14.8 g/dL (ref 13.0–18.0)
Lymphocytes Relative: 20 %
Lymphs Abs: 1.7 10*3/uL (ref 1.0–3.6)
MCH: 29.1 pg (ref 26.0–34.0)
MCHC: 33.7 g/dL (ref 32.0–36.0)
MCV: 86.4 fL (ref 80.0–100.0)
Monocytes Absolute: 0.5 10*3/uL (ref 0.2–1.0)
Monocytes Relative: 6 %
Neutro Abs: 6.1 10*3/uL (ref 1.4–6.5)
Neutrophils Relative %: 72 %
Platelets: 226 10*3/uL (ref 150–440)
RBC: 5.07 MIL/uL (ref 4.40–5.90)
RDW: 14.4 % (ref 11.5–14.5)
WBC: 8.5 10*3/uL (ref 3.8–10.6)

## 2015-02-24 LAB — C DIFFICILE QUICK SCREEN W PCR REFLEX
C Diff antigen: NEGATIVE
C Diff interpretation: NEGATIVE
C Diff toxin: NEGATIVE

## 2015-02-24 LAB — COMPREHENSIVE METABOLIC PANEL
ALT: 32 U/L (ref 17–63)
AST: 18 U/L (ref 15–41)
Albumin: 3.9 g/dL (ref 3.5–5.0)
Alkaline Phosphatase: 63 U/L (ref 38–126)
Anion gap: 10 (ref 5–15)
BUN: 12 mg/dL (ref 6–20)
CO2: 24 mmol/L (ref 22–32)
Calcium: 8.9 mg/dL (ref 8.9–10.3)
Chloride: 106 mmol/L (ref 101–111)
Creatinine, Ser: 0.83 mg/dL (ref 0.61–1.24)
GFR calc Af Amer: >60 mL/min (ref >60)
GFR calc non Af Amer: >60 mL/min (ref >60)
Glucose, Bld: 99 mg/dL (ref 65–99)
Potassium: 4 mmol/L (ref 3.5–5.1)
Sodium: 140 mmol/L (ref 135–145)
Total Bilirubin: 0.5 mg/dL (ref 0.3–1.2)
Total Protein: 7 g/dL (ref 6.5–8.1)

## 2015-02-24 MED ORDER — METHOCARBAMOL 500 MG PO TABS
750.0000 mg | ORAL_TABLET | ORAL | Status: AC
  Administered 2015-02-24: 750 mg via ORAL
  Filled 2015-02-24: qty 2

## 2015-02-24 MED ORDER — ACETAMINOPHEN 500 MG PO TABS
ORAL_TABLET | ORAL | Status: AC
  Administered 2015-02-24: 1000 mg via ORAL
  Filled 2015-02-24: qty 2

## 2015-02-24 MED ORDER — OXCARBAZEPINE 150 MG PO TABS
150.0000 mg | ORAL_TABLET | Freq: Three times a day (TID) | ORAL | Status: DC
  Administered 2015-02-24 – 2015-02-25 (×4): 150 mg via ORAL
  Filled 2015-02-24 (×9): qty 1

## 2015-02-24 MED ORDER — ONDANSETRON 8 MG PO TBDP
ORAL_TABLET | ORAL | Status: AC
  Administered 2015-02-24: 8 mg via ORAL
  Filled 2015-02-24: qty 1

## 2015-02-24 MED ORDER — CLONIDINE HCL 0.1 MG PO TABS
0.1000 mg | ORAL_TABLET | ORAL | Status: AC
  Administered 2015-02-24: 0.1 mg via ORAL
  Filled 2015-02-24: qty 1

## 2015-02-24 MED ORDER — DIPHENOXYLATE-ATROPINE 2.5-0.025 MG PO TABS
2.0000 | ORAL_TABLET | ORAL | Status: AC
Start: 2015-02-24 — End: 2015-02-24
  Administered 2015-02-24: 2 via ORAL
  Filled 2015-02-24: qty 2

## 2015-02-24 MED ORDER — BUSPIRONE HCL 5 MG PO TABS
7.5000 mg | ORAL_TABLET | Freq: Three times a day (TID) | ORAL | Status: DC
  Administered 2015-02-24 – 2015-02-25 (×4): 7.5 mg via ORAL
  Filled 2015-02-24 (×4): qty 2

## 2015-02-24 MED ORDER — TRAZODONE HCL 50 MG PO TABS
50.0000 mg | ORAL_TABLET | Freq: Every day | ORAL | Status: DC
  Administered 2015-02-24: 50 mg via ORAL
  Filled 2015-02-24: qty 1

## 2015-02-24 MED ORDER — GABAPENTIN 300 MG PO CAPS
400.0000 mg | ORAL_CAPSULE | Freq: Three times a day (TID) | ORAL | Status: DC
  Administered 2015-02-24 – 2015-02-25 (×3): 400 mg via ORAL
  Filled 2015-02-24 (×3): qty 1

## 2015-02-24 MED ORDER — GABAPENTIN 300 MG PO CAPS
300.0000 mg | ORAL_CAPSULE | Freq: Three times a day (TID) | ORAL | Status: DC
  Administered 2015-02-24: 300 mg via ORAL
  Filled 2015-02-24: qty 1

## 2015-02-24 MED ORDER — ALUM & MAG HYDROXIDE-SIMETH 200-200-20 MG/5ML PO SUSP
30.0000 mL | Freq: Once | ORAL | Status: AC
  Administered 2015-02-24: 30 mL via ORAL
  Filled 2015-02-24: qty 30

## 2015-02-24 MED ORDER — ACETAMINOPHEN 500 MG PO TABS
1000.0000 mg | ORAL_TABLET | Freq: Once | ORAL | Status: AC
  Administered 2015-02-24: 1000 mg via ORAL

## 2015-02-24 MED ORDER — LOPERAMIDE HCL 2 MG PO CAPS
ORAL_CAPSULE | ORAL | Status: AC
  Administered 2015-02-24: 4 mg via ORAL
  Filled 2015-02-24: qty 2

## 2015-02-24 NOTE — ED Notes (Signed)
BEHAVIORAL HEALTH ROUNDING Patient sleeping: No. Patient alert and oriented: yes Behavior appropriate: Yes.  ; If no, describe:  Nutrition and fluids offered: No Toileting and hygiene offered: No Sitter present: yes Law enforcement present: Yes  

## 2015-02-24 NOTE — ED Notes (Signed)
Report to matt martin, rn.  

## 2015-02-24 NOTE — ED Notes (Signed)
Pt. Alert and oriented, warm and dry, in no distress. Pt. Denies SI, HI, and AVH. Pt. Encouraged to let nursing staff know of any concerns or needs. 

## 2015-02-24 NOTE — ED Notes (Signed)

## 2015-02-24 NOTE — ED Notes (Signed)
BEHAVIORAL HEALTH ROUNDING Patient sleeping: No. Patient alert and oriented: yes Behavior appropriate: Yes.  ; If no, describe:  Nutrition and fluids offered: Yes  Toileting and hygiene offered: Yes  Sitter present: no Law enforcement present: Yes  

## 2015-02-24 NOTE — ED Notes (Signed)

## 2015-02-24 NOTE — ED Notes (Signed)
Pt. Alert and oriented. Denies problems or complaints. 

## 2015-02-24 NOTE — ED Notes (Signed)
Pt. Noted in room. No complaints or concerns voiced. No distress or abnormal behavior noted. Will continue to monitor with security cameras. Q 15 minute rounds continue. 

## 2015-02-24 NOTE — ED Notes (Signed)
BEHAVIORAL HEALTH ROUNDING  Patient sleeping: Yes.  Patient alert and oriented: no  Behavior appropriate: Yes. ; If no, describe:  Nutrition and fluids offered: No  Toileting and hygiene offered: No  Sitter present: no  Law enforcement present: Yes   

## 2015-02-24 NOTE — ED Notes (Signed)
BEHAVIORAL HEALTH ROUNDING Patient sleeping: No. Patient alert and oriented: yes Behavior appropriate: No.; If no, describe:  Nutrition and fluids offered: No Toileting and hygiene offered: No Sitter present: yes Law enforcement present: Yes

## 2015-02-24 NOTE — ED Notes (Signed)
Sandwich and soft drink given.  

## 2015-02-24 NOTE — ED Notes (Signed)
BEHAVIORAL HEALTH ROUNDING Patient sleeping: No. Patient alert and oriented: yes Behavior appropriate: Yes.  ; If no, describe:  Nutrition and fluids offered: Yes  Toileting and hygiene offered: No Sitter present: yes Law enforcement present: Yes  

## 2015-02-24 NOTE — ED Provider Notes (Signed)
-----------------------------------------   5:42 AM on 02/24/2015 -----------------------------------------   BP 107/66 mmHg  Pulse 54  Temp(Src) 98.1 F (36.7 C) (Oral)  Resp 18  Ht 6' (1.829 m)  Wt 182 lb (82.555 kg)  BMI 24.68 kg/m2  SpO2 99%  The patient has been calm and cooperative. Continue care as specified by psychiatry. Disposition is pending per Psychiatry/Behavioral Medicine team recommendations.      Darien Ramusavid W Yen Wandell, MD 02/24/15 [phone]

## 2015-02-24 NOTE — ED Notes (Signed)
ED BHU PLACEMENT JUSTIFICATION  Is the patient under IVC or is there intent for IVC: Yes.  Is the patient medically cleared: Yes.  Is there vacancy in the ED BHU: Yes.  Is the population mix appropriate for patient: Yes.  Is the patient awaiting placement in inpatient or outpatient setting: Yes.  Has the patient had a psychiatric consult: Yes.  Survey of unit performed for contraband, proper placement and condition of furniture, tampering with fixtures in bathroom, shower, and each patient room: Yes. ; Findings: All clear  APPEARANCE/BEHAVIOR  calm, cooperative and adequate rapport can be established  NEURO ASSESSMENT  Orientation: time, place and person  Hallucinations: No.None noted (Hallucinations)  Speech: Normal  Gait: normal  RESPIRATORY ASSESSMENT  WNL  CARDIOVASCULAR ASSESSMENT  WNL  GASTROINTESTINAL ASSESSMENT  WNL  EXTREMITIES  WNL  PLAN OF CARE  Provide calm/safe environment. Vital signs assessed twice daily. ED BHU Assessment once each 12-hour shift. Collaborate with intake RN daily or as condition indicates. Assure the ED provider has rounded once each shift. Provide and encourage hygiene. Provide redirection as needed. Assess for escalating behavior; address immediately and inform ED provider.  Assess family dynamic and appropriateness for visitation as needed: Yes. ; If necessary, describe findings:  Educate the patient/family about BHU procedures/visitation: Yes. ; If necessary, describe findings: Pt is calm and cooperative at this time. Pt understanding and accepting of unit procedures/rules. Will continue to monitor.  BEHAVIORAL HEALTH ROUNDING  Patient sleeping: No.  Patient alert and oriented: yes  Behavior appropriate: Yes. ; If no, describe:  Nutrition and fluids offered: Yes  Toileting and hygiene offered: Yes  Sitter present: not applicable  Law enforcement present: Yes [facility]  ENVIRONMENTAL ASSESSMENT  Potentially harmful objects out of patient reach: Yes.   Personal belongings secured: Yes.  Patient dressed in hospital provided attire only: Yes.  Plastic bags out of patient reach: Yes.  Patient care equipment (cords, cables, call bells, lines, and drains) shortened, removed, or accounted for: Yes.  Equipment and supplies removed from bottom of stretcher: Yes.  Potentially toxic materials out of patient reach: Yes.  Sharps container removed or out of patient reach: Yes.

## 2015-02-24 NOTE — ED Notes (Signed)
ED BHU PLACEMENT JUSTIFICATION Is the patient under IVC or is there intent for IVC: Yes.   Is the patient medically cleared: Yes.   Is there vacancy in the ED BHU: Yes.   Is the population mix appropriate for patient: Yes.   Is the patient awaiting placement in inpatient or outpatient setting: No. Has the patient had a psychiatric consult: Yes.   Survey of unit performed for contraband, proper placement and condition of furniture, tampering with fixtures in bathroom, shower, and each patient room: Yes.  ; Findings:  APPEARANCE/BEHAVIOR calm NEURO ASSESSMENT Orientation: time, place and person Hallucinations: No.None noted (Hallucinations) Speech: Normal Gait: normal RESPIRATORY ASSESSMENT Normal expansion.  Clear to auscultation.  No rales, rhonchi, or wheezing. CARDIOVASCULAR ASSESSMENT regular rate and rhythm, S1, S2 normal, no murmur, click, rub or gallop GASTROINTESTINAL ASSESSMENT soft, nontender, BS WNL, no r/g EXTREMITIES normal strength, tone, and muscle mass PLAN OF CARE Provide calm/safe environment. Vital signs assessed twice daily. ED BHU Assessment once each 12-hour shift. Collaborate with intake RN daily or as condition indicates. Assure the ED provider has rounded once each shift. Provide and encourage hygiene. Provide redirection as needed. Assess for escalating behavior; address immediately and inform ED provider.  Assess family dynamic and appropriateness for visitation as needed: no; If necessary, describe findings:  Educate the patient/family about BHU procedures/visitation: Yes.  ; If necessary, describe findings:

## 2015-02-24 NOTE — ED Notes (Signed)
Pt. Weight listed as 182 lbs.  Pt. Weight was listed as 290 lbs 10 days ago.  Pt. Appears to still be at around 290 lbs.  Pt. Needs to be weighed.

## 2015-02-24 NOTE — ED Notes (Signed)
Pt in the shower 

## 2015-02-24 NOTE — Consult Note (Signed)
[facility] Face-to-Face Psychiatry Consult   Reason for Consult:  Consult for this 44 year old man with a history of opiate abuse and past history of alcohol abuse who came into the hospital with severe pain and suicidal ideation. Referring Physician:  Thomasene Lot Patient Identification: Scott [name] MRN:  [id number] Principal Diagnosis: Opioid type dependence, continuous Diagnosis:   Patient Active Problem List   Diagnosis Date Noted  . Drug-seeking behavior [F19.10] 02/16/2015  . MDD (major depressive disorder) [F32.2] 12/26/2014  . PTSD (post-traumatic stress disorder) [F43.10] 12/26/2014  . Opioid type dependence, continuous [F11.20] 12/26/2014  . Cocaine abuse with cocaine-induced mood disorder [F14.14] 12/26/2014  . Major depressive disorder, recurrent episode, moderate [F33.1]   . Chest pain at rest [R07.9] 10/22/2013  . 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 Chronic pain syndrome [G89.4] 10/22/2013  . Seizure disorder [G40.909] 10/22/2013  . Bipolar disorder, unspecified [F31.9] 10/22/2013  . Withdrawal symptoms, drug or narcotic [F19.939] 10/22/2013  . Chest pain [R07.9] 10/21/2013  . Leg swelling [M79.89] 10/21/2013    Total Time spent with patient: 1 hour  Subjective:   Scott [name] is a 44 y.o. male patient admitted wi"I'm in pain and the pain makes me suicidal". Patient reports having suicidal thoughts and feeling hopeless and depressed related to his withdrawal from opiates. Information from the patient and the chart. Patient states that he stopped going to his methadone clinic about a month ago because he felt that he was developing side effects from methadone including leg swelling. After he stopped going to the methadone clinic he also lost access to his psychiatrist. He has seen an emergency or walk-in physician for some pain medicine since then and was given hydrocodone but can't get it refilled. He ran out of hydrocodone about 3 days ago. He reports multiple  symptoms of opiate withdrawal including severe back pain, leg pain, nausea and vomiting and diarrhea, abdominal pain, nasal congestion. His report is very bad and depressed. He has trouble sleeping at night. He has suicidal thoughts with thoughts of hanging himself most recently. Denies having any hallucinations denies homicidal ideation. He has still been compliant with his usual psychiatric medicine combination of Trileptal BuSpar Celexa and gabapentin even though he has run out of narcotics.  Past psychiatric history: Patient has had chronic pain issues since he was in the [employer]. He has PTSD that has been related both to [employer] service and to childhood trauma. He has a history of several hospitalizations and has made serious suicide attempts in the past. He was treated at a methadone clinic with what sounds like good mental health response but he believes he was having side effects. He has been to a Suboxone clinic briefly but says he didn't follow-up because he couldn't afford the medicine.  Social history: Patient is a English as a second language teacher but does not get any benefits from the [place]. He does get disability and has Medicare. He is married and has 3 young sons at home.  Medical history: Chronic back pain and leg pain related to a diagnosis of spinal stenosis and past back surgery. History of hypokalemia. History of some leg swelling of unclear etiology.  Family history: Positive for multiple people with substance abuse problems and mental health problems.  Substance abuse history: History of alcohol abuse but says that he has stopped drinking and remained sober for an extended period of time. History of opiate abuse that is of long-standing. [facility] and has followed up with outpatient treatment in [place].  I Elements:   Quality:  Depressed mood with suicidal ideation, multiple symptoms of opiate withdrawal. Severity:  Serious possibly life-threatening. Timing:  Worse over  the last few days. Duration:  Long-standing intermittent problem. Context:  Pain and lack of outpatient treatment.  Past Medical History:  Past Medical History  Diagnosis Date  . Back pain   . Seizures   . History of ETOH abuse   . Bipolar 1 disorder   . Myocardial infarction   . Anxiety   . Depression   . GERD (gastroesophageal reflux disease)   . Chronic back pain   . Opioid dependence     Past Surgical History  Procedure Laterality Date  . Back surgery    . Appendectomy    . Abdominal surgery     Family History:  Family History  Problem Relation Age of Onset  . Hypertension Mother   . CAD Father   . COPD Father   . Stroke Father   . Testicular cancer Brother    Social History:  History  Alcohol Use No    Comment: last drink over 1 year ago     History  Drug Use No    History   Social History  . Marital Status: Married    Spouse Name: N/A  . Number of Children: N/A  . Years of Education: N/A   Social History Main Topics  . Smoking status: Heavy Tobacco Smoker -- 1.50 packs/day    Types: Cigarettes  . Smokeless tobacco: Never Used  . Alcohol Use: No     Comment: last drink over 1 year ago  . Drug Use: No  . Sexual Activity: Yes   Other Topics Concern  . None   Social History Narrative   Additional Social History:    Pain Medications: History of abusing them Prescriptions: History of abusing them Over the Counter: None Reported History of alcohol / drug use?: Yes Longest period of sobriety (when/how long): 8 months opiates while on methadone  Negative Consequences of Use: Financial, Legal, Personal relationships Name of Substance 1: opioid oxy, roxy  1 - Age of First Use: 33 1 - Amount (size/oz): 120 mg daily  1 - Frequency: daily 1 - Duration: 11 years 1 - Last Use / Amount: about 6, 1/2 months ago Name of Substance 2: Cocaine 2 - Age of First Use: reports a couple of months ago  2 - Amount (size/oz): unknown 2 - Frequency: unknown 2 -  Duration: about 2 months 2 - Last Use / Amount: Denies current use. Reports of only trying it one time. Name of Substance 3: etoh  3 - Age of First Use: [age] 3 - Amount (size/oz): 1 drink  3 - Frequency: once every 2-3 months 3 - Duration: years 3 - Last Use / Amount: Reports 12/2014 Name of Substance 4: THC  4 - Age of First Use: 17 or 18 4 - Last Use / Amount: reports it was a one time use Name of Substance 5: heroin 5 - Age of First Use: 42 5 - Amount (size/oz): 1 to 2 grams 5 - Frequency: 3x's a week 5 - Duration: 2014-2016 5 - Last Use / Amount: 02/20/2015           Allergies:   Allergies  Allergen Reactions  . Benadryl [Diphenhydramine Hcl] Anaphylaxis and Other (See Comments)    Muscles lock up.  . Celecoxib Other (See Comments)    Nose bleeds, Headache, All over Discomfort  . Prednisone Other (  See Comments)    Severe Anger  . Risperidone And Related Other (See Comments)    Overly sedates  . Rofecoxib Swelling and Other (See Comments)    Nose Bleeds  . Nsaids Rash and Other (See Comments)    Nose bleeds    Labs:  Results for orders placed or performed during the hospital encounter of 02/23/15 (from the past 48 hour(s))  C difficile quick scan w PCR reflex ([facility] only)     Status: None   Collection Time: 02/24/15  9:27 AM  Result Value Ref Range   C Diff antigen NEGATIVE    C Diff toxin NEGATIVE    C Diff interpretation Negative for C. difficile     Vitals: Blood pressure 119/65, pulse 62, temperature 97.9 F (36.6 C), temperature source Oral, resp. rate 18, height 6' (1.829 m), weight 82.555 kg (182 lb), SpO2 98 %.  Risk to Self: Suicidal Ideation: Yes-Currently Present Suicidal Intent: No Is patient at risk for suicide?: Yes Suicidal Plan?: Yes-Currently Present Specify Current Suicidal Plan: Patient reports of having thoughts of overdosing. (pt attempt suicide 12-25-14 via carbon monoxide) Access to Means: Yes Specify Access to Suicidal Means: Have  access to medications What has been your use of drugs/alcohol within the last 12 months?: Pt has hx of abusing opioids, cocaine and heroin How many times?: 3 (Per assessment 12/26/2014. pt. reported 12. He states 3) Other Self Harm Risks: None Reported Triggers for Past Attempts: Unpredictable, Other (Comment) (Active Addiction) Intentional Self Injurious Behavior: None Risk to Others: Homicidal Ideation: No Thoughts of Harm to Others: No Current Homicidal Intent: No Current Homicidal Plan: No Access to Homicidal Means: No Identified Victim: None Reported History of harm to others?: No Assessment of Violence: None Noted Violent Behavior Description: None Reported Does patient have access to weapons?: No Criminal Charges Pending?: No Does patient have a court date: No Prior Inpatient Therapy: Prior Inpatient Therapy: Yes Prior Therapy Dates: 2011, 2013, 2014 2016 multiple Prior Therapy Facilty/Provider(s): [facility], [facility], [facility]  Reason for Treatment: depression, suicide attempts, SA Prior Outpatient Therapy: Prior Outpatient Therapy: Yes Prior Therapy Dates: current Prior Therapy Facilty/Provider(s): [facility] Reason for Treatment: SA and MH counseling  Does patient have an ACCT team?: No Does patient have Intensive In-House Services?  : No Does patient have [facility] services? : No Does patient have P4CC services?: No  Current Facility-Administered Medications  Medication Dose Route Frequency Provider Last Rate Last Dose  . busPIRone (BUSPAR) tablet 7.5 mg  7.5 mg Oral TID Sharyn Creamer, MD   7.5 mg at 02/24/15 1035  . cloNIDine (CATAPRES) tablet 0.1 mg  0.1 mg Oral STAT Audery Amel, MD      . diphenoxylate-atropine (LOMOTIL) 2.5-0.025 MG per tablet 2 tablet  2 tablet Oral STAT Audery Amel, MD      . gabapentin (NEURONTIN) capsule 400 mg  400 mg Oral TID Sharyn Creamer, MD      . loperamide (IMODIUM) capsule 4 mg  4 mg Oral Q6H PRN Sharman Cheek, MD   4 mg at 02/24/15 0901   . methocarbamol (ROBAXIN) tablet 750 mg  750 mg Oral STAT Audery Amel, MD      . ondansetron (ZOFRAN-ODT) disintegrating tablet 8 mg  8 mg Oral Q6H PRN Sharman Cheek, MD   8 mg at 02/24/15 0901  . OXcarbazepine (TRILEPTAL) tablet 150 mg  150 mg Oral TID Sharyn Creamer, MD      . traZODone (DESYREL) tablet 50 mg  50 mg Oral QHS Sharyn Creamer, MD       Current Outpatient Prescriptions  Medication Sig Dispense Refill  . busPIRone (BUSPAR) 7.5 MG tablet Take 1 tablet (7.5 mg total) by mouth 3 (three) times daily. 90 tablet 0  . cyclobenzaprine (FLEXERIL) 10 MG tablet Take 1 tablet (10 mg total) by mouth 2 (two) times daily as needed for muscle spasms. 11 tablet 0  . docusate sodium (COLACE) 100 MG capsule Take 100 mg by mouth 2 (two) times daily as needed for mild constipation.     . furosemide (LASIX) 20 MG tablet Take 1 tablet (20 mg total) by mouth daily. 30 tablet 0  . gabapentin (NEURONTIN) 400 MG capsule Take 2 capsules (800 mg total) by mouth 3 (three) times daily. 180 capsule 0  . HYDROcodone-acetaminophen (NORCO/VICODIN) 5-325 MG per tablet Take 1-2 tablets by mouth every 6 hours as needed for pain and/or cough. 7 tablet 0  . loperamide (IMODIUM A-D) 2 MG tablet Take 2 tablets (4 mg total) by mouth 4 (four) times daily as needed for diarrhea or loose stools. 30 tablet 0  . methadone (DOLOPHINE) 10 MG/ML solution Take 9.9 mLs (99 mg total) by mouth daily.  0  . nicotine (NICODERM CQ - DOSED IN MG/24 HOURS) 21 mg/24hr patch Place 1 patch (21 mg total) onto the skin daily. 28 patch 0  . omeprazole (PRILOSEC) 20 MG capsule Take 1 capsule (20 mg total) by mouth daily.    . ondansetron (ZOFRAN ODT) 8 MG disintegrating tablet Take 1 tablet (8 mg total) by mouth every 8 (eight) hours as needed for nausea or vomiting. 20 tablet 0  . OXcarbazepine (TRILEPTAL) 150 MG tablet Take 1 tablet (150 mg total) by mouth 3 (three) times daily. 90 tablet 0  . potassium chloride (K-DUR,KLOR-CON) 10 MEQ tablet Take  1 tablet (10 mEq total) by mouth daily. To prevent low potassium from diuretic usage. 30 tablet 0  . predniSONE (DELTASONE) 20 MG tablet Take 2 tablets (40 mg total) by mouth daily. 10 tablet 0  . traZODone (DESYREL) 50 MG tablet Take 1 tablet (50 mg total) by mouth at bedtime. 30 tablet 0    Musculoskeletal: Strength & Muscle Tone: within normal limits Gait & Station: shuffle Patient leans: N/A  Psychiatric Specialty Exam: Physical Exam  Constitutional: He appears well-developed and well-nourished.  HENT:  Head: Normocephalic and atraumatic.  Eyes: Conjunctivae are normal. Pupils are equal, round, and reactive to light.  Neck: Normal range of motion.  Cardiovascular: Normal heart sounds.   Respiratory: Effort normal.  GI: Soft.  Musculoskeletal: Normal range of motion.  Neurological: He is alert.  Skin: Skin is warm and dry.  Psychiatric: His speech is normal and behavior is normal. Judgment normal. His mood appears anxious. Cognition and memory are normal. He exhibits a depressed mood. He expresses suicidal ideation.    Review of Systems  Constitutional: Positive for malaise/fatigue and diaphoresis.  HENT: Positive for congestion.   Eyes: Negative.   Respiratory: Negative.   Cardiovascular: Negative.   Gastrointestinal: Positive for vomiting and diarrhea.  Musculoskeletal: Positive for back pain.  Skin: Negative.   Neurological: Positive for sensory change and weakness.  Psychiatric/Behavioral: Positive for depression, suicidal ideas and substance abuse. The patient is nervous/anxious and has insomnia.     Blood pressure 119/65, pulse 62, temperature 97.9 F (36.6 C), temperature source Oral, resp. rate 18, height 6' (1.829 m), weight 82.555 kg (182 lb), SpO2 98 %.Body mass index is 24.68 kg/(m^2).  General Appearance: Fairly Groomed  Patent attorneyye Contact::  Good  Speech:  Clear and Coherent  Volume:  Normal  Mood:  Anxious  Affect:  Depressed  Thought Process:  Linear   Orientation:  Full (Time, Place, and Person)  Thought Content:  Negative  Suicidal Thoughts:  Yes.  with intent/plan  Homicidal Thoughts:  No  Memory:  Immediate;   Good Recent;   Good Remote;   Good  Judgement:  Fair  Insight:  Fair  Psychomotor Activity:  Decreased  Concentration:  Fair  Recall:  Good  Fund of Knowledge:Good  Language: Good  Akathisia:  No  Handed:  Right  AIMS (if indicated):     Assets:  Communication Skills Desire for Improvement Financial Resources/Insurance Social Support  ADL's:  Intact  Cognition: WNL  Sleep:      Medical Decision Making: Review of Psycho-Social Stressors (1), Review or order clinical lab tests (1), Established Problem, Worsening (2), Review of Medication Regimen & Side Effects (2) and Review of New Medication or Change in Dosage (2)  Treatment Plan Summary: Medication management and Plan Patient is reporting multiple severe symptoms of opiate withdrawal. He also has depression symptoms and PTSD. Past history of suicide attempts. He has active suicidal thoughts. He does not appear to be psychotic. Patient came to our hospital thinking that he could be admitted for detox. At this point we have no inpatient psychiatric beds even if we did want to admit him because of his suicidality. He would be best served by substance abuse treatment along with his detox treatment. I recommendation is that we try to refer him to the alcohol and drug abuse treatment center in [place]. I have asked social work to help with making that referral. Meanwhile I have given him doses of Robaxin and Lomotil and clonidine one time to try and help with acute symptoms. Continue current outpatient psychiatric medicines.  Plan:  Supportive therapy provided about ongoing stressors. Discussed crisis plan, support from social network, calling 911, coming to the Emergency Department, and calling Suicide Hotline. Disposition Seen note above. No beds available on psychiatry.  Working on possible referral to appropriate inpatient treatment  Scott RasmussenJohn Shacara [name] 02/24/2015 12:49 PM

## 2015-02-24 NOTE — ED Notes (Signed)
BEHAVIORAL HEALTH ROUNDING  Patient sleeping: No.  Patient alert and oriented: yes  Behavior appropriate: Yes. ; If no, describe:  Nutrition and fluids offered: Yes  Toileting and hygiene offered: Yes  Sitter present: not applicable  Law enforcement present: Yes ODS  

## 2015-02-24 NOTE — ED Notes (Signed)
Report received previously from ann cales, rn. Will move pt to bhu.

## 2015-02-25 MED ORDER — GABAPENTIN 100 MG PO CAPS
ORAL_CAPSULE | ORAL | Status: AC
  Filled 2015-02-25: qty 1

## 2015-02-25 MED ORDER — GABAPENTIN 300 MG PO CAPS
ORAL_CAPSULE | ORAL | Status: AC
  Filled 2015-02-25: qty 1

## 2015-02-25 MED ORDER — ONDANSETRON 8 MG PO TBDP
ORAL_TABLET | ORAL | Status: AC
  Administered 2015-02-25: 8 mg via ORAL
  Filled 2015-02-25: qty 1

## 2015-02-25 MED ORDER — NALOXONE HCL 0.4 MG/0.4ML IJ SOAJ
1.0000 | Freq: Once | INTRAMUSCULAR | Status: DC
Start: 2015-02-25 — End: 2017-06-11

## 2015-02-25 NOTE — ED Notes (Signed)
Upon examination of chart and pt,  Pt was found to be SI with a plan to hang himself.  Dr Toni Amendlapacs documented this on 7/18.  No orders found that state no sitter needed.  Stephaine, Consulting civil engineercharge RN, made aware of need for sitter for pt.

## 2015-02-25 NOTE — ED Notes (Signed)
BEHAVIORAL HEALTH ROUNDING Patient sleeping: No. Patient alert and oriented: yes Behavior appropriate: Yes.  ; If no, describe:  Nutrition and fluids offered: Yes  Toileting and hygiene offered: Yes  Sitter present: yes Law enforcement present: Yes  

## 2015-02-25 NOTE — Consult Note (Signed)
[facility] Face-to-Face Psychiatry Consult   Reason for Consult:  Consult for this 44 year old man with a history of opiate abuse and past history of alcohol abuse who came into the hospital with severe pain and suicidal ideation. Referring Physician:  Thomasene Lot Patient Identification: Scott [name] MRN:  [id number] Principal Diagnosis: Opioid type dependence, continuous Diagnosis:   Patient Active Problem List   Diagnosis Date Noted  . Drug-seeking behavior [F19.10] 02/16/2015  . MDD (major depressive disorder) [F32.2] 12/26/2014  . PTSD (post-traumatic stress disorder) [F43.10] 12/26/2014  . Opioid type dependence, continuous [F11.20] 12/26/2014  . Cocaine abuse with cocaine-induced mood disorder [F14.14] 12/26/2014  . Major depressive disorder, recurrent episode, moderate [F33.1]   . Chest pain at rest [R07.9] 10/22/2013  . 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 Chronic pain syndrome [G89.4] 10/22/2013  . Seizure disorder [G40.909] 10/22/2013  . Bipolar disorder, unspecified [F31.9] 10/22/2013  . Withdrawal symptoms, drug or narcotic [F19.939] 10/22/2013  . Chest pain [R07.9] 10/21/2013  . Leg swelling [M79.89] 10/21/2013    Total Time spent with patient: 1 hour  Subjective:   Scott [name] is a 44 y.o. male patient admitted wi"I'm in pain and the pain makes me suicidal". Patient reports having suicidal thoughts and feeling hopeless and depressed related to his withdrawal from opiates. Information from the patient and the chart. Patient states that he stopped going to his methadone clinic about a month ago because he felt that he was developing side effects from methadone including leg swelling. After he stopped going to the methadone clinic he also lost access to his psychiatrist. He has seen an emergency or walk-in physician for some pain medicine since then and was given hydrocodone but can't get it refilled. He ran out of hydrocodone about 3 days ago. He reports multiple  symptoms of opiate withdrawal including severe back pain, leg pain, nausea and vomiting and diarrhea, abdominal pain, nasal congestion. His report is very bad and depressed. He has trouble sleeping at night. He has suicidal thoughts with thoughts of hanging himself most recently. Denies having any hallucinations denies homicidal ideation. He has still been compliant with his usual psychiatric medicine combination of Trileptal BuSpar Celexa and gabapentin even though he has run out of narcotics.  Past psychiatric history: Patient has had chronic pain issues since he was in the [employer]. He has PTSD that has been related both to [employer] service and to childhood trauma. He has a history of several hospitalizations and has made serious suicide attempts in the past. He was treated at a methadone clinic with what sounds like good mental health response but he believes he was having side effects. He has been to a Suboxone clinic briefly but says he didn't follow-up because he couldn't afford the medicine.  Social history: Patient is a English as a second language teacher but does not get any benefits from the [place]. He does get disability and has Medicare. He is married and has 3 young sons at home.  Medical history: Chronic back pain and leg pain related to a diagnosis of spinal stenosis and past back surgery. History of hypokalemia. History of some leg swelling of unclear etiology.  Family history: Positive for multiple people with substance abuse problems and mental health problems.  Substance abuse history: History of alcohol abuse but says that he has stopped drinking and remained sober for an extended period of time. History of opiate abuse that is of long-standing. [facility] and has followed up with outpatient treatment in [place].  Updated history as of Tuesday the 19th. On reexamination today the patient states that he is feeling much better. In fact he describes himself as "100%". He says that all of  his symptoms of withdrawal are gone. He is not having pain, nasal congestion, GI symptoms. He also says his mood is much better. He denies any suicidal thoughts at all. He is able to articulate multiple positive plans for the future. He says that he would like to follow-up with alcohol and drug treatment services in [facility]. Patient is requesting discharge and feels like he would be able to manage staying clean outside the hospital. He has not shown any dangerous behavior here in the hospital.  I Elements:   Quality:  Depressed mood with suicidal ideation, multiple symptoms of opiate withdrawal. Severity:  Serious possibly life-threatening. Timing:  Worse over the last few days. Duration:  Long-standing intermittent problem. Context:  Pain and lack of outpatient treatment.  Past Medical History:  Past Medical History  Diagnosis Date  . Back pain   . Seizures   . History of ETOH abuse   . Bipolar 1 disorder   . Myocardial infarction   . Anxiety   . Depression   . GERD (gastroesophageal reflux disease)   . Chronic back pain   . Opioid dependence     Past Surgical History  Procedure Laterality Date  . Back surgery    . Appendectomy    . Abdominal surgery     Family History:  Family History  Problem Relation Age of Onset  . Hypertension Mother   . CAD Father   . COPD Father   . Stroke Father   . Testicular cancer Brother    Social History:  History  Alcohol Use No    Comment: last drink over 1 year ago     History  Drug Use No    History   Social History  . Marital Status: Married    Spouse Name: N/A  . Number of Children: N/A  . Years of Education: N/A   Social History Main Topics  . Smoking status: Heavy Tobacco Smoker -- 1.50 packs/day    Types: Cigarettes  . Smokeless tobacco: Never Used  . Alcohol Use: No     Comment: last drink over 1 year ago  . Drug Use: No  . Sexual Activity: Yes   Other Topics Concern  . None   Social History Narrative    Additional Social History:    Pain Medications: History of abusing them Prescriptions: History of abusing them Over the Counter: None Reported History of alcohol / drug use?: Yes Longest period of sobriety (when/how long): 8 months opiates while on methadone  Negative Consequences of Use: Financial, Legal, Personal relationships Name of Substance 1: opioid oxy, roxy  1 - Age of First Use: 33 1 - Amount (size/oz): 120 mg daily  1 - Frequency: daily 1 - Duration: 11 years 1 - Last Use / Amount: about 6, 1/2 months ago Name of Substance 2: Cocaine 2 - Age of First Use: reports a couple of months ago  2 - Amount (size/oz): unknown 2 - Frequency: unknown 2 - Duration: about 2 months 2 - Last Use / Amount: Denies current use. Reports of only trying it one time. Name of Substance 3: etoh  3 - Age of First Use: 20-23 3 - Amount (size/oz): 1 drink  3 - Frequency: once every 2-3 months 3 - Duration: years 3 - Last Use / Amount:  Reports 12/2014 Name of Substance 4: THC  4 - Age of First Use: 17 or 18 4 - Last Use / Amount: reports it was a one time use Name of Substance 5: heroin 5 - Age of First Use: 42 5 - Amount (size/oz): 1 to 2 grams 5 - Frequency: 3x's a week 5 - Duration: 2014-2016 5 - Last Use / Amount: 02/20/2015           Allergies:   Allergies  Allergen Reactions  . Benadryl [Diphenhydramine Hcl] Anaphylaxis and Other (See Comments)    Muscles lock up.  . Celecoxib Other (See Comments)    Nose bleeds, Headache, All over Discomfort  . Prednisone Other (See Comments)    Severe Anger  . Risperidone And Related Other (See Comments)    Overly sedates  . Rofecoxib Swelling and Other (See Comments)    Nose Bleeds  . Nsaids Rash and Other (See Comments)    Nose bleeds    Labs:  Results for orders placed or performed during the hospital encounter of 02/23/15 (from the past 48 hour(s))  C difficile quick scan w PCR reflex ([facility] only)     Status: None   Collection  Time: 02/24/15  9:27 AM  Result Value Ref Range   C Diff antigen NEGATIVE    C Diff toxin NEGATIVE    C Diff interpretation Negative for C. difficile   CBC with Differential/Platelet     Status: None   Collection Time: 02/24/15  3:00 PM  Result Value Ref Range   WBC 8.5 3.8 - 10.6 K/uL   RBC 5.07 4.40 - 5.90 MIL/uL   Hemoglobin 14.8 13.0 - 18.0 g/dL   HCT 43.8 40.0 - 52.0 %   MCV 86.4 80.0 - 100.0 fL   MCH 29.1 26.0 - 34.0 pg   MCHC 33.7 32.0 - 36.0 g/dL   RDW 14.4 11.5 - 14.5 %   Platelets 226 150 - 440 K/uL   Neutrophils Relative % 72 %   Neutro Abs 6.1 1.4 - 6.5 K/uL   Lymphocytes Relative 20 %   Lymphs Abs 1.7 1.0 - 3.6 K/uL   Monocytes Relative 6 %   Monocytes Absolute 0.5 0.2 - 1.0 K/uL   Eosinophils Relative 2 %   Eosinophils Absolute 0.2 0 - 0.7 K/uL   Basophils Relative 0 %   Basophils Absolute 0.0 0 - 0.1 K/uL  Comprehensive metabolic panel     Status: None   Collection Time: 02/24/15  4:15 PM  Result Value Ref Range   Sodium 140 135 - 145 mmol/L   Potassium 4.0 3.5 - 5.1 mmol/L   Chloride 106 101 - 111 mmol/L   CO2 24 22 - 32 mmol/L   Glucose, Bld 99 65 - 99 mg/dL   BUN 12 6 - 20 mg/dL   Creatinine, Ser 0.83 0.61 - 1.24 mg/dL   Calcium 8.9 8.9 - 10.3 mg/dL   Total Protein 7.0 6.5 - 8.1 g/dL   Albumin 3.9 3.5 - 5.0 g/dL   AST 18 15 - 41 U/L   ALT 32 17 - 63 U/L   Alkaline Phosphatase 63 38 - 126 U/L   Total Bilirubin 0.5 0.3 - 1.2 mg/dL   GFR calc non Af Amer >60 >60 mL/min   GFR calc Af Amer >60 >60 mL/min    Comment: (NOTE) The eGFR has been calculated using the CKD EPI equation. This calculation has not been validated in all clinical situations. eGFR's persistently <60 mL/min  signify possible Chronic Kidney Disease.    Anion gap 10 5 - 15  Urinalysis complete, with microscopic ([facility] only)     Status: Abnormal   Collection Time: 02/24/15  4:22 PM  Result Value Ref Range   Color, Urine YELLOW (A) YELLOW   APPearance CLEAR (A) CLEAR   Glucose, UA  NEGATIVE NEGATIVE mg/dL   Bilirubin Urine NEGATIVE NEGATIVE   Ketones, ur NEGATIVE NEGATIVE mg/dL   Specific Gravity, Urine 1.021 1.005 - 1.030   Hgb urine dipstick NEGATIVE NEGATIVE   pH 6.0 5.0 - 8.0   Protein, ur NEGATIVE NEGATIVE mg/dL   Nitrite NEGATIVE NEGATIVE   Leukocytes, UA NEGATIVE NEGATIVE   RBC / HPF 0-5 0 - 5 RBC/hpf   WBC, UA 0-5 0 - 5 WBC/hpf   Bacteria, UA NONE SEEN NONE SEEN   Squamous Epithelial / LPF 0-5 (A) NONE SEEN   Mucous PRESENT   Urine Drug Screen, Qualitative ([facility] only)     Status: Abnormal   Collection Time: 02/24/15  4:22 PM  Result Value Ref Range   Tricyclic, Ur Screen NONE DETECTED NONE DETECTED   Amphetamines, Ur Screen NONE DETECTED NONE DETECTED   MDMA (Ecstasy)Ur Screen NONE DETECTED NONE DETECTED   Cocaine Metabolite,Ur [place] POSITIVE (A) NONE DETECTED   Opiate, Ur Screen POSITIVE (A) NONE DETECTED   Phencyclidine (PCP) Ur S NONE DETECTED NONE DETECTED   Cannabinoid 50 Ng, Ur [place] NONE DETECTED NONE DETECTED   Barbiturates, Ur Screen POSITIVE (A) NONE DETECTED   Benzodiazepine, Ur Scrn POSITIVE (A) NONE DETECTED   Methadone Scn, Ur NONE DETECTED NONE DETECTED    Comment: (NOTE) 704  Tricyclics, urine               Cutoff 1000 ng/mL 200  Amphetamines, urine             Cutoff 1000 ng/mL 300  MDMA (Ecstasy), urine           Cutoff 500 ng/mL 400  Cocaine Metabolite, urine       Cutoff 300 ng/mL 500  Opiate, urine                   Cutoff 300 ng/mL 600  Phencyclidine (PCP), urine      Cutoff 25 ng/mL 700  Cannabinoid, urine              Cutoff 50 ng/mL 800  Barbiturates, urine             Cutoff 200 ng/mL 900  Benzodiazepine, urine           Cutoff 200 ng/mL 1000 Methadone, urine                Cutoff 300 ng/mL 1100 1200 The urine drug screen provides only a preliminary, unconfirmed 1300 analytical test result and should not be used for non-medical 1400 purposes. Clinical consideration and professional judgment should 1500 be applied to any  positive drug screen result due to possible 1600 interfering substances. A more specific alternate chemical method 1700 must be used in order to obtain a confirmed analytical result.  1800 Gas chromato graphy / mass spectrometry (GC/MS) is the preferred 1900 confirmatory method.     Vitals: Blood pressure 104/52, pulse 59, temperature 97.9 F (36.6 C), temperature source Oral, resp. rate 18, height 6' (1.829 m), weight 82.555 kg (182 lb), SpO2 97 %.  Risk to Self: Suicidal Ideation: Yes-Currently Present Suicidal Intent: No Is patient at risk for suicide?: Yes Suicidal Plan?:  Yes-Currently Present Specify Current Suicidal Plan: Patient reports of having thoughts of overdosing. (pt attempt suicide 12-25-14 via carbon monoxide) Access to Means: Yes Specify Access to Suicidal Means: Have access to medications What has been your use of drugs/alcohol within the last 12 months?: Pt has hx of abusing opioids, cocaine and heroin How many times?: 3 (Per assessment 12/26/2014. pt. reported 12. He states 3) Other Self Harm Risks: None Reported Triggers for Past Attempts: Unpredictable, Other (Comment) (Active Addiction) Intentional Self Injurious Behavior: None Risk to Others: Homicidal Ideation: No Thoughts of Harm to Others: No Current Homicidal Intent: No Current Homicidal Plan: No Access to Homicidal Means: No Identified Victim: None Reported History of harm to others?: No Assessment of Violence: None Noted Violent Behavior Description: None Reported Does patient have access to weapons?: No Criminal Charges Pending?: No Does patient have a court date: No Prior Inpatient Therapy: Prior Inpatient Therapy: Yes Prior Therapy Dates: 2011, 2013, 2014 2016 multiple Prior Therapy Facilty/Provider(s): [place] [place], [facility]  Reason for Treatment: depression, suicide attempts, SA Prior Outpatient Therapy: Prior Outpatient Therapy: Yes Prior Therapy Dates: current Prior Therapy  Facilty/Provider(s): [facility] Reason for Treatment: SA and MH counseling  Does patient have an ACCT team?: No Does patient have Intensive In-House Services?  : No Does patient have [facility] services? : No Does patient have P4[facility] services?: No  Current Facility-Administered Medications  Medication Dose Route Frequency Provider Last Rate Last Dose  . busPIRone (BUSPAR) tablet 7.5 mg  7.5 mg Oral TID Delman Kitten, MD   7.5 mg at 02/25/15 1029  . gabapentin (NEURONTIN) 300 MG capsule           . gabapentin (NEURONTIN) capsule 400 mg  400 mg Oral TID Delman Kitten, MD   400 mg at 02/25/15 1030  . loperamide (IMODIUM) capsule 4 mg  4 mg Oral Q6H PRN Carrie Mew, MD   4 mg at 02/24/15 0901  . ondansetron (ZOFRAN-ODT) disintegrating tablet 8 mg  8 mg Oral Q6H PRN Carrie Mew, MD   8 mg at 02/25/15 0459  . OXcarbazepine (TRILEPTAL) tablet 150 mg  150 mg Oral TID Delman Kitten, MD   150 mg at 02/25/15 1029  . traZODone (DESYREL) tablet 50 mg  50 mg Oral QHS Delman Kitten, MD   50 mg at 02/24/15 2156   Current Outpatient Prescriptions  Medication Sig Dispense Refill  . benztropine (COGENTIN) 2 MG tablet Take 2 mg by mouth 2 (two) times daily as needed (muscle cramps).    . busPIRone (BUSPAR) 7.5 MG tablet Take 1 tablet (7.5 mg total) by mouth 3 (three) times daily. 90 tablet 0  . citalopram (CELEXA) 20 MG tablet Take 20 mg by mouth every morning.    . furosemide (LASIX) 20 MG tablet Take 1 tablet (20 mg total) by mouth daily. 30 tablet 0  . gabapentin (NEURONTIN) 400 MG capsule Take 2 capsules (800 mg total) by mouth 3 (three) times daily. 180 capsule 0  . potassium chloride (K-DUR,KLOR-CON) 10 MEQ tablet Take 1 tablet (10 mEq total) by mouth daily. To prevent low potassium from diuretic usage. (Patient taking differently: Take 10 mEq by mouth daily. ) 30 tablet 0  . ranitidine (ZANTAC) 150 MG tablet Take 150 mg by mouth 2 (two) times daily.    . traZODone (DESYREL) 50 MG tablet Take 1 tablet (50 mg total) by  mouth at bedtime. 30 tablet 0  . loperamide (IMODIUM A-D) 2 MG tablet Take 2 tablets (4 mg  total) by mouth 4 (four) times daily as needed for diarrhea or loose stools. 30 tablet 0  . Naloxone HCl 0.4 MG/0.4ML SOAJ Inject 1 application as directed once. Dispense 1 naloxone autoinjector kit 1 Package 0  . ondansetron (ZOFRAN ODT) 8 MG disintegrating tablet Take 1 tablet (8 mg total) by mouth every 8 (eight) hours as needed for nausea or vomiting. 20 tablet 0    Musculoskeletal: Strength & Muscle Tone: within normal limits Gait & Station: shuffle Patient leans: N/A  Psychiatric Specialty Exam: Physical Exam  Constitutional: He appears well-developed and well-nourished.  HENT:  Head: Normocephalic and atraumatic.  Eyes: Conjunctivae are normal. Pupils are equal, round, and reactive to light.  Neck: Normal range of motion.  Cardiovascular: Normal heart sounds.   Respiratory: Effort normal.  GI: Soft.  Musculoskeletal: Normal range of motion.  Neurological: He is alert.  Skin: Skin is warm and dry.  Psychiatric: He has a normal mood and affect. His speech is normal and behavior is normal. Judgment and thought content normal. His mood appears not anxious. Cognition and memory are normal. He does not exhibit a depressed mood. He expresses no suicidal ideation.  Patient today is showing a calm and appropriate affect. Denies feeling depressed. States he is optimistic. He totally denies any suicidal thoughts and is able to articulate positive plans for the future.    Review of Systems  Constitutional: Negative for malaise/fatigue.  HENT: Negative.  Negative for congestion.   Eyes: Negative.   Respiratory: Negative.   Cardiovascular: Negative.   Gastrointestinal: Negative.  Negative for vomiting and diarrhea.  Musculoskeletal: Negative.  Negative for back pain.  Skin: Negative.   Neurological: Negative.  Negative for sensory change and weakness.  Psychiatric/Behavioral: Positive for  substance abuse. Negative for depression and suicidal ideas. The patient is not nervous/anxious and does not have insomnia.     Blood pressure 104/52, pulse 59, temperature 97.9 F (36.6 C), temperature source Oral, resp. rate 18, height 6' (1.829 m), weight 82.555 kg (182 lb), SpO2 97 %.Body mass index is 24.68 kg/(m^2).  General Appearance: Fairly Groomed  Engineer, water::  Good  Speech:  Clear and Coherent  Volume:  Normal  Mood:  Anxious  Affect:  Appropriate  Thought Process:  Linear  Orientation:  Full (Time, Place, and Person)  Thought Content:  Negative  Suicidal Thoughts:  No  Homicidal Thoughts:  No  Memory:  Immediate;   Good Recent;   Good Remote;   Good  Judgement:  Fair  Insight:  Fair  Psychomotor Activity:  Normal  Concentration:  Fair  Recall:  Good  Fund of Knowledge:Good  Language: Good  Akathisia:  No  Handed:  Right  AIMS (if indicated):     Assets:  Communication Skills Desire for Improvement Financial Resources/Insurance Social Support  ADL's:  Intact  Cognition: WNL  Sleep:      Medical Decision Making: Review of Psycho-Social Stressors (1), Review or order clinical lab tests (1), Established Problem, Worsening (2), Review of Medication Regimen & Side Effects (2) and Review of New Medication or Change in Dosage (2)  Treatment Plan Summary: Medication management and Plan Patient today is presenting with a markedly improved history and mental status. He says that his withdrawal symptoms are all relieved. Consequently his mood is feeling much better. He does not have any suicidal thoughts. He is actually optimistic about being able to manage his sobriety. He would like to follow-up with alcohol and drug services in [facility] where  he has already received services. Supportive counseling and encouragement offered. Case discussed with the hospital emergency room physician. Patient will not require any new prescriptions. He looks like his vital signs are  physically stable. I have taken him off involuntary commitment and he may be discharged to the company of his wife or whoever else picks him up.  Plan:  Supportive therapy provided about ongoing stressors. Discussed crisis plan, support from social network, calling 911, coming to the Emergency Department, and calling Suicide Hotline. Disposition Seen note above.   02/25/2015 1:20 PM

## 2015-02-25 NOTE — Consult Note (Signed)
  Psychiatry: This patient is in the behavioral health unit of the emergency room under 24-hour a day supervision and is not showing any dangerous behavior. Furthermore he completely denies suicidal ideation. Sitter discontinued.

## 2015-02-25 NOTE — ED Notes (Signed)
BEHAVIORAL HEALTH ROUNDING Patient sleeping: Yes.   Patient alert and oriented: not applicable Behavior appropriate: Yes.    Nutrition and fluids offered: No Toileting and hygiene offered: No Sitter present: q15 minute observations and security camera monitoring Law enforcement present: Yes [employer]  ENVIRONMENTAL ASSESSMENT Potentially harmful objects out of patient reach: Yes.   Personal belongings secured: Yes.   Patient dressed in hospital provided attire only: Yes.   Plastic bags out of patient reach: Yes.   Patient care equipment removed, or accounted for: Yes.   Equipment and supplies removed from bottom of stretcher: Yes.   Potentially toxic materials out of patient reach: Yes.   Sharps container removed or out of patient reach: Yes.   ED BHU PLACEMENT JUSTIFICATION Is the patient under IVC or is there intent for IVC: Yes.    Is IVC current? Yes Is the patient medically cleared: Yes.   Is there vacancy in the ED BHU: Yes.   Is the population mix appropriate for patient: Yes.   Is the patient awaiting placement in inpatient or outpatient setting: Yes.   Has the patient had a psychiatric consult: Yes.   Survey of unit performed for contraband, proper placement and condition of furniture, tampering with fixtures in bathroom, shower, and each patient room: Yes.  ; Findings: none APPEARANCE/BEHAVIOR Cooperative,calm NEURO ASSESSMENT Orientation: person Hallucinations:None noted at this time Speech: Normal Gait: normal RESPIRATORY ASSESSMENT Breathing Pattern-regular, no respiratory distress noted CARDIOVASCULAR ASSESSMENT Skin color appropriate for age and race GASTROINTESTINAL ASSESSMENT no GI distress noted EXTREMITIES Moves all extremities, no distress noted PLAN OF CARE Provide calm/safe environment. Vital signs assessed twice daily. ED BHU Assessment once each 12-hour shift. Collaborate with intake RN daily or as condition indicates. Assure the ED Aureliano Oshields has  rounded once each shift. Provide and encourage hygiene. Provide redirection as needed. Assess for escalating behavior; address immediately and inform ED Nadav Swindell.  Assess family dynamic and appropriateness for visitation as needed: Yes.    Educate the patient/family about BHU procedures/visitation: Yes.

## 2015-02-25 NOTE — ED Notes (Signed)
BEHAVIORAL HEALTH ROUNDING Patient sleeping: Yes.   Patient alert and oriented: not applicable Behavior appropriate: Yes.    Nutrition and fluids offered: No Toileting and hygiene offered: No Sitter present: q15 minute observations and security camera monitoring Law enforcement present: Yes [employer] 

## 2015-02-25 NOTE — ED Notes (Signed)

## 2015-02-25 NOTE — ED Notes (Signed)
ED BHU PLACEMENT JUSTIFICATION Is the patient under IVC or is there intent for IVC: Yes.   Is the patient medically cleared: Yes.   Is there vacancy in the ED BHU: Yes.   Is the population mix appropriate for patient: Yes.   Is the patient awaiting placement in inpatient or outpatient setting: Yes.   Has the patient had a psychiatric consult: Yes.   Survey of unit performed for contraband, proper placement and condition of furniture, tampering with fixtures in bathroom, shower, and each patient room: Yes.  ; Findings:  APPEARANCE/BEHAVIOR calm, cooperative and adequate rapport can be established NEURO ASSESSMENT Orientation: time, place and person Hallucinations: No.None noted (Hallucinations) Speech: Normal Gait: normal RESPIRATORY ASSESSMENT Normal expansion.  Clear to auscultation.  No rales, rhonchi, or wheezing. CARDIOVASCULAR ASSESSMENT regular rate and rhythm, S1, S2 normal, no murmur, click, rub or gallop GASTROINTESTINAL ASSESSMENT soft, nontender, BS WNL, no r/g EXTREMITIES normal strength, tone, and muscle mass, no deformities, no erythema, induration, or nodules, no evidence of joint effusion, ROM of all joints is normal, no evidence of joint instability PLAN OF CARE Provide calm/safe environment. Vital signs assessed twice daily. ED BHU Assessment once each 12-hour shift. Collaborate with intake RN daily or as condition indicates. Assure the ED provider has rounded once each shift. Provide and encourage hygiene. Provide redirection as needed. Assess for escalating behavior; address immediately and inform ED provider.  Assess family dynamic and appropriateness for visitation as needed: Yes.  ; If necessary, describe findings:  Educate the patient/family about BHU procedures/visitation: Yes.  ; If necessary, describe findings:

## 2015-02-25 NOTE — ED Provider Notes (Addendum)
-----------------------------------------   7:21 AM on 02/25/2015 -----------------------------------------   Blood pressure 105/60, pulse 52, temperature 98.9 F (37.2 C), temperature source Oral, resp. rate 18, height 6' (1.829 m), weight 182 lb (82.555 kg), SpO2 97 %.  The patient had no acute events since last update.  Calm and resting in his bed.  Currently awaiting a bed to open for appropriate treatment for his severe opiate withdrawals and PTSD.     Sharyn CreamerMark Dan Scearce, MD 02/25/15 [id number]  ----------------------------------------- 1:15 PM on 02/25/2015 -----------------------------------------  Patient seen and IVC rescinded by Dr. Toni Amendlapacs. Advises the patient is going to follow up outpatient [facility] been seen before. He is no longer suicidal. Patient awake and alert with stable vital signs. We'll discharge to home, wife coming to pick the patient up.  We'll give him discharge instructions as well as a prescription for naloxone.  Sharyn CreamerMark Anaih Brander, MD 02/25/15 1316

## 2015-02-25 NOTE — ED Notes (Signed)
Patient assigned to appropriate care area. Patient oriented to unit/care area: Informed that, for their safety, care areas are designed for safety and monitored by security cameras at all times; and visiting hours explained to patient. Patient verbalizes understanding, and verbal contract for safety obtained.  Report from Dr. Toni Amendlapacs states pt had thoughts of SI yesterday with plan to hang himself.  Called charge RN and notified need for sitter for pt.  Today pt states he feels better than last night,  States he did not have diarrhea all night.  Also denies SI at this time.  States " I have clarity in my mind today".  States he did have thoughts yesterday of hitting head against wall but denies that at this time.  Denies HI, denies hallucinations.

## 2015-02-25 NOTE — ED Notes (Signed)
[facility], care assumed.  Pt resting at this time in bed.

## 2015-02-25 NOTE — ED Notes (Signed)
BEHAVIORAL HEALTH ROUNDING Patient sleeping: No. Patient alert and oriented: yes Behavior appropriate: Yes.  ; If no, describe:  Nutrition and fluids offered: Yes  Toileting and hygiene offered: Yes  Sitter present: no Law enforcement present: Yes  

## 2015-02-25 NOTE — ED Notes (Addendum)
Pt given d/c instructions, verbalized understanding, d/c home with wife. In no acute distress, denies si, denies hi, denies hallucinations

## 2015-02-25 NOTE — ED Notes (Signed)
Pt requested medication for nausea. 8 mg ODT Zofran obtained from Main ED Pyxis and administered per MD order.

## 2015-07-21 ENCOUNTER — Encounter (HOSPITAL_COMMUNITY): Payer: Self-pay | Admitting: Nurse Practitioner

## 2015-07-21 ENCOUNTER — Emergency Department (HOSPITAL_COMMUNITY)
Admission: EM | Admit: 2015-07-21 | Discharge: 2015-07-21 | Disposition: A | Discharge status: Other Acute Inpatient Hospital | Payer: Medicare Other | Attending: Emergency Medicine | Admitting: Emergency Medicine

## 2015-07-21 ENCOUNTER — Inpatient Hospital Stay (HOSPITAL_COMMUNITY)
Admission: AD | Admit: 2015-07-21 | Discharge: 2015-07-26 | DRG: 882 | Disposition: A | Discharge status: Home / Self Care | Payer: Medicare Other | Source: Intra-hospital | Attending: Psychiatry | Admitting: Psychiatry

## 2015-07-21 DIAGNOSIS — F17210 Nicotine dependence, cigarettes, uncomplicated: Secondary | ICD-10-CM | POA: Diagnosis present

## 2015-07-21 DIAGNOSIS — F1410 Cocaine abuse, uncomplicated: Secondary | ICD-10-CM | POA: Diagnosis present

## 2015-07-21 DIAGNOSIS — R0789 Other chest pain: Secondary | ICD-10-CM | POA: Diagnosis present

## 2015-07-21 DIAGNOSIS — F10230 Alcohol dependence with withdrawal, uncomplicated: Secondary | ICD-10-CM | POA: Diagnosis present

## 2015-07-21 DIAGNOSIS — F319 Bipolar disorder, unspecified: Secondary | ICD-10-CM | POA: Insufficient documentation

## 2015-07-21 DIAGNOSIS — I252 Old myocardial infarction: Secondary | ICD-10-CM | POA: Insufficient documentation

## 2015-07-21 DIAGNOSIS — F419 Anxiety disorder, unspecified: Secondary | ICD-10-CM | POA: Insufficient documentation

## 2015-07-21 DIAGNOSIS — K219 Gastro-esophageal reflux disease without esophagitis: Secondary | ICD-10-CM | POA: Diagnosis not present

## 2015-07-21 DIAGNOSIS — R45851 Suicidal ideations: Secondary | ICD-10-CM | POA: Insufficient documentation

## 2015-07-21 DIAGNOSIS — F1120 Opioid dependence, uncomplicated: Secondary | ICD-10-CM | POA: Diagnosis present

## 2015-07-21 DIAGNOSIS — G8929 Other chronic pain: Secondary | ICD-10-CM | POA: Insufficient documentation

## 2015-07-21 DIAGNOSIS — F1024 Alcohol dependence with alcohol-induced mood disorder: Secondary | ICD-10-CM | POA: Diagnosis present

## 2015-07-21 DIAGNOSIS — F1994 Other psychoactive substance use, unspecified with psychoactive substance-induced mood disorder: Secondary | ICD-10-CM | POA: Diagnosis present

## 2015-07-21 DIAGNOSIS — F4310 Post-traumatic stress disorder, unspecified: Principal | ICD-10-CM | POA: Diagnosis present

## 2015-07-21 DIAGNOSIS — M549 Dorsalgia, unspecified: Secondary | ICD-10-CM | POA: Diagnosis present

## 2015-07-21 DIAGNOSIS — G40909 Epilepsy, unspecified, not intractable, without status epilepticus: Secondary | ICD-10-CM

## 2015-07-21 DIAGNOSIS — F331 Major depressive disorder, recurrent, moderate: Secondary | ICD-10-CM | POA: Diagnosis not present

## 2015-07-21 DIAGNOSIS — Z79899 Other long term (current) drug therapy: Secondary | ICD-10-CM | POA: Insufficient documentation

## 2015-07-21 DIAGNOSIS — F10239 Alcohol dependence with withdrawal, unspecified: Secondary | ICD-10-CM | POA: Diagnosis present

## 2015-07-21 DIAGNOSIS — F329 Major depressive disorder, single episode, unspecified: Secondary | ICD-10-CM | POA: Diagnosis present

## 2015-07-21 DIAGNOSIS — E669 Obesity, unspecified: Secondary | ICD-10-CM | POA: Diagnosis not present

## 2015-07-21 DIAGNOSIS — F10939 Alcohol use, unspecified with withdrawal, unspecified: Secondary | ICD-10-CM | POA: Diagnosis present

## 2015-07-21 DIAGNOSIS — F1110 Opioid abuse, uncomplicated: Secondary | ICD-10-CM

## 2015-07-21 HISTORY — DX: Post-traumatic stress disorder, unspecified: F43.10

## 2015-07-21 LAB — COMPREHENSIVE METABOLIC PANEL
ALT: 30 U/L (ref 17–63)
AST: 19 U/L (ref 15–41)
Albumin: 3.8 g/dL (ref 3.5–5.0)
Alkaline Phosphatase: 62 U/L (ref 38–126)
Anion gap: 7 (ref 5–15)
BUN: 9 mg/dL (ref 6–20)
CO2: 24 mmol/L (ref 22–32)
Calcium: 8.9 mg/dL (ref 8.9–10.3)
Chloride: 107 mmol/L (ref 101–111)
Creatinine, Ser: 0.7 mg/dL (ref 0.61–1.24)
GFR calc Af Amer: >60 mL/min (ref >60)
GFR calc non Af Amer: >60 mL/min (ref >60)
Glucose, Bld: 128 mg/dL — ABNORMAL HIGH (ref 65–99)
Potassium: 3.5 mmol/L (ref 3.5–5.1)
Sodium: 138 mmol/L (ref 135–145)
Total Bilirubin: 1.3 mg/dL — ABNORMAL HIGH (ref 0.3–1.2)
Total Protein: 6.8 g/dL (ref 6.5–8.1)

## 2015-07-21 LAB — ETHANOL: Alcohol, Ethyl (B): <5 mg/dL (ref <5)

## 2015-07-21 LAB — CBC
HCT: 43.4 % (ref 39.0–52.0)
Hemoglobin: 15.3 g/dL (ref 13.0–17.0)
MCH: 30.4 pg (ref 26.0–34.0)
MCHC: 35.3 g/dL (ref 30.0–36.0)
MCV: 86.3 fL (ref 78.0–100.0)
Platelets: 204 10*3/uL (ref 150–400)
RBC: 5.03 MIL/uL (ref 4.22–5.81)
RDW: 13.1 % (ref 11.5–15.5)
WBC: 7.8 10*3/uL (ref 4.0–10.5)

## 2015-07-21 LAB — RAPID URINE DRUG SCREEN, HOSP PERFORMED
Amphetamines: NOT DETECTED
Barbiturates: NOT DETECTED
Benzodiazepines: NOT DETECTED
Cocaine: NOT DETECTED
Opiates: POSITIVE — AB
Tetrahydrocannabinol: NOT DETECTED

## 2015-07-21 LAB — SALICYLATE LEVEL: Salicylate Lvl: <4 mg/dL (ref 2.8–30.0)

## 2015-07-21 LAB — ACETAMINOPHEN LEVEL: Acetaminophen (Tylenol), Serum: <10 ug/mL — ABNORMAL LOW (ref 10–30)

## 2015-07-21 MED ORDER — CITALOPRAM HYDROBROMIDE 10 MG PO TABS: 20.0000 mg | ORAL_TABLET | ORAL | Status: DC

## 2015-07-21 MED ORDER — FAMOTIDINE 20 MG PO TABS
20.0000 mg | ORAL_TABLET | Freq: Every day | ORAL | Status: DC
  Administered 2015-07-21: 20 mg via ORAL
  Filled 2015-07-21: qty 1

## 2015-07-21 MED ORDER — BENZTROPINE MESYLATE 1 MG PO TABS: 2.0000 mg | ORAL_TABLET | Freq: Two times a day (BID) | ORAL | Status: DC | PRN

## 2015-07-21 MED ORDER — ALUM & MAG HYDROXIDE-SIMETH 200-200-20 MG/5ML PO SUSP
30.0000 mL | ORAL | Status: DC | PRN
Start: 2015-07-21 — End: 2015-07-21

## 2015-07-21 MED ORDER — GABAPENTIN 400 MG PO CAPS
800.0000 mg | ORAL_CAPSULE | Freq: Three times a day (TID) | ORAL | Status: DC
  Administered 2015-07-21 (×2): 800 mg via ORAL
  Filled 2015-07-21 (×2): qty 2

## 2015-07-21 MED ORDER — ACETAMINOPHEN 325 MG PO TABS
650.0000 mg | ORAL_TABLET | ORAL | Status: DC | PRN
  Administered 2015-07-21: 650 mg via ORAL
  Filled 2015-07-21: qty 2

## 2015-07-21 MED ORDER — BUSPIRONE HCL 15 MG PO TABS
7.5000 mg | ORAL_TABLET | Freq: Three times a day (TID) | ORAL | Status: DC
  Administered 2015-07-21 (×2): 7.5 mg via ORAL
  Filled 2015-07-21 (×2): qty 1
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
## 2015-07-21 NOTE — Progress Notes (Signed)
Patient [facility]. Rosey BathKelly Jabir Dahlem, RN

## 2015-07-21 NOTE — ED Notes (Signed)
Report attempted, RN refuse to get report, encouraged to call after 7 pm for report after change of shift. ED CN notified.

## 2015-07-21 NOTE — ED Notes (Signed)
Pt called multiple times to update pt vital signs with no answer.

## 2015-07-21 NOTE — ED Notes (Signed)
Called at 13:50
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
## 2015-07-22 ENCOUNTER — Encounter (HOSPITAL_COMMUNITY): Payer: Self-pay

## 2015-07-22 DIAGNOSIS — R45851 Suicidal ideations: Secondary | ICD-10-CM

## 2015-07-22 DIAGNOSIS — F10230 Alcohol dependence with withdrawal, uncomplicated: Secondary | ICD-10-CM | POA: Diagnosis present

## 2015-07-22 DIAGNOSIS — F1994 Other psychoactive substance use, unspecified with psychoactive substance-induced mood disorder: Secondary | ICD-10-CM

## 2015-07-22 MED ORDER — ONDANSETRON 4 MG PO TBDP: 4.0000 mg | ORAL_TABLET | Freq: Four times a day (QID) | ORAL | Status: DC | PRN

## 2015-07-22 MED ORDER — DICYCLOMINE HCL 20 MG PO TABS: 20.0000 mg | ORAL_TABLET | Freq: Four times a day (QID) | ORAL | Status: DC | PRN

## 2015-07-22 MED ORDER — GABAPENTIN 400 MG PO CAPS
800.0000 mg | ORAL_CAPSULE | Freq: Three times a day (TID) | ORAL | Status: DC
  Administered 2015-07-22 – 2015-07-26 (×14): 800 mg via ORAL
  Filled 2015-07-22: qty 2
  Filled 2015-07-22 (×3): qty 42
  Filled 2015-07-22 (×20): qty 2

## 2015-07-22 MED ORDER — ALUM & MAG HYDROXIDE-SIMETH 200-200-20 MG/5ML PO SUSP: 30.0000 mL | ORAL | Status: DC | PRN

## 2015-07-22 MED ORDER — CLONIDINE HCL 0.1 MG PO TABS
0.1000 mg | ORAL_TABLET | Freq: Four times a day (QID) | ORAL | Status: AC
  Administered 2015-07-22 – 2015-07-23 (×8): 0.1 mg via ORAL
  Filled 2015-07-22 (×9): qty 1

## 2015-07-22 MED ORDER — LORAZEPAM 1 MG PO TABS
1.0000 mg | ORAL_TABLET | Freq: Four times a day (QID) | ORAL | Status: AC
  Administered 2015-07-22 (×4): 1 mg via ORAL
  Filled 2015-07-22 (×4): qty 1

## 2015-07-22 MED ORDER — CITALOPRAM HYDROBROMIDE 20 MG PO TABS
20.0000 mg | ORAL_TABLET | ORAL | Status: DC
  Administered 2015-07-22 – 2015-07-23 (×2): 20 mg via ORAL
  Filled 2015-07-22 (×5): qty 1

## 2015-07-22 MED ORDER — ONDANSETRON 4 MG PO TBDP
4.0000 mg | ORAL_TABLET | Freq: Four times a day (QID) | ORAL | Status: DC | PRN
  Administered 2015-07-22: 4 mg via ORAL
  Filled 2015-07-22: qty 1

## 2015-07-22 MED ORDER — LORAZEPAM 1 MG PO TABS
1.0000 mg | ORAL_TABLET | Freq: Three times a day (TID) | ORAL | Status: AC
  Administered 2015-07-23 (×3): 1 mg via ORAL
  Filled 2015-07-22 (×3): qty 1

## 2015-07-22 MED ORDER — NAPROXEN 500 MG PO TABS: 500.0000 mg | ORAL_TABLET | Freq: Two times a day (BID) | ORAL | Status: DC | PRN

## 2015-07-22 MED ORDER — METHOCARBAMOL 500 MG PO TABS
500.0000 mg | ORAL_TABLET | Freq: Three times a day (TID) | ORAL | Status: DC | PRN
  Administered 2015-07-22 – 2015-07-26 (×5): 500 mg via ORAL
  Filled 2015-07-22 (×5): qty 1

## 2015-07-22 MED ORDER — NICOTINE 21 MG/24HR TD PT24
21.0000 mg | MEDICATED_PATCH | Freq: Every day | TRANSDERMAL | Status: DC
  Administered 2015-07-22 – 2015-07-26 (×5): 21 mg via TRANSDERMAL
  Filled 2015-07-22 (×7): qty 1

## 2015-07-22 MED ORDER — TRAMADOL HCL 50 MG PO TABS
50.0000 mg | ORAL_TABLET | Freq: Four times a day (QID) | ORAL | Status: DC | PRN
  Administered 2015-07-22 – 2015-07-26 (×14): 50 mg via ORAL
  Filled 2015-07-22 (×14): qty 1

## 2015-07-22 MED ORDER — LOPERAMIDE HCL 2 MG PO CAPS
2.0000 mg | ORAL_CAPSULE | ORAL | Status: DC | PRN
  Administered 2015-07-22: 4 mg via ORAL
  Filled 2015-07-22: qty 2

## 2015-07-22 MED ORDER — HYDROXYZINE HCL 25 MG PO TABS
25.0000 mg | ORAL_TABLET | Freq: Four times a day (QID) | ORAL | Status: DC | PRN
  Administered 2015-07-22: 25 mg via ORAL
  Filled 2015-07-22: qty 1

## 2015-07-22 MED ORDER — ADULT MULTIVITAMIN W/MINERALS CH
1.0000 | ORAL_TABLET | Freq: Every day | ORAL | Status: DC
  Administered 2015-07-22 – 2015-07-26 (×5): 1 via ORAL
  Filled 2015-07-22 (×9): qty 1

## 2015-07-22 MED ORDER — MAGNESIUM HYDROXIDE 400 MG/5ML PO SUSP: 30.0000 mL | Freq: Every day | ORAL | Status: DC | PRN

## 2015-07-22 MED ORDER — ACETAMINOPHEN 325 MG PO TABS
650.0000 mg | ORAL_TABLET | Freq: Four times a day (QID) | ORAL | Status: DC | PRN
  Administered 2015-07-24 (×2): 650 mg via ORAL
  Filled 2015-07-22 (×2): qty 2

## 2015-07-22 MED ORDER — LORAZEPAM 1 MG PO TABS: 1.0000 mg | ORAL_TABLET | Freq: Every day | ORAL | Status: DC

## 2015-07-22 MED ORDER — VITAMIN B-1 100 MG PO TABS
100.0000 mg | ORAL_TABLET | Freq: Every day | ORAL | Status: DC
  Administered 2015-07-23 – 2015-07-26 (×4): 100 mg via ORAL
  Filled 2015-07-22 (×7): qty 1

## 2015-07-22 MED ORDER — FAMOTIDINE 20 MG PO TABS
20.0000 mg | ORAL_TABLET | Freq: Two times a day (BID) | ORAL | Status: DC
  Administered 2015-07-22 – 2015-07-26 (×9): 20 mg via ORAL
  Filled 2015-07-22 (×16): qty 1

## 2015-07-22 MED ORDER — BUSPIRONE HCL 15 MG PO TABS
7.5000 mg | ORAL_TABLET | Freq: Three times a day (TID) | ORAL | Status: DC
  Administered 2015-07-22 – 2015-07-26 (×14): 7.5 mg via ORAL
  Filled 2015-07-22 (×2): qty 1
  Filled 2015-07-22: qty 11
  Filled 2015-07-22 (×13): qty 1
  Filled 2015-07-22: qty 11
  Filled 2015-07-22: qty 1
  Filled 2015-07-22: qty 11
  Filled 2015-07-22 (×4): qty 1

## 2015-07-22 MED ORDER — CLONIDINE HCL 0.1 MG PO TABS
0.1000 mg | ORAL_TABLET | Freq: Every day | ORAL | Status: DC
  Administered 2015-07-26: 0.1 mg via ORAL
  Filled 2015-07-22 (×3): qty 1

## 2015-07-22 MED ORDER — HYDROXYZINE HCL 25 MG PO TABS
25.0000 mg | ORAL_TABLET | Freq: Four times a day (QID) | ORAL | Status: DC | PRN
  Administered 2015-07-26: 25 mg via ORAL
  Filled 2015-07-22 (×4): qty 1

## 2015-07-22 MED ORDER — METHOCARBAMOL 500 MG PO TABS: 500.0000 mg | ORAL_TABLET | Freq: Three times a day (TID) | ORAL | Status: DC | PRN

## 2015-07-22 MED ORDER — CLONIDINE HCL 0.1 MG PO TABS
0.1000 mg | ORAL_TABLET | ORAL | Status: AC
  Administered 2015-07-24 – 2015-07-25 (×4): 0.1 mg via ORAL
  Filled 2015-07-22 (×4): qty 1

## 2015-07-22 MED ORDER — LORAZEPAM 1 MG PO TABS
1.0000 mg | ORAL_TABLET | Freq: Four times a day (QID) | ORAL | Status: AC | PRN
  Administered 2015-07-23 (×2): 1 mg via ORAL
  Filled 2015-07-22 (×3): qty 1

## 2015-07-22 MED ORDER — LORAZEPAM 1 MG PO TABS
1.0000 mg | ORAL_TABLET | Freq: Two times a day (BID) | ORAL | Status: DC
  Administered 2015-07-24: 1 mg via ORAL
  Filled 2015-07-22: qty 1

## 2015-07-22 MED ORDER — HYDROXYZINE HCL 25 MG PO TABS: 25.0000 mg | ORAL_TABLET | Freq: Four times a day (QID) | ORAL | Status: DC | PRN

## 2015-07-22 MED ORDER — LOPERAMIDE HCL 2 MG PO CAPS: 2.0000 mg | ORAL_CAPSULE | ORAL | Status: DC | PRN

## 2015-07-22 MED ORDER — THIAMINE HCL 100 MG/ML IJ SOLN: 100.0000 mg | Freq: Once | INTRAMUSCULAR | Status: DC

## 2015-07-22 MED ORDER — HYDROXYZINE HCL 50 MG PO TABS
50.0000 mg | ORAL_TABLET | Freq: Every evening | ORAL | Status: DC | PRN
  Administered 2015-07-22 – 2015-07-25 (×5): 50 mg via ORAL
  Filled 2015-07-22 (×16): qty 1

## 2015-07-22 NOTE — Progress Notes (Signed)
D: Patient in the hallway on approach.  Patient states, "I feel so much better and my withdrawal symptoms are nothing [facility] they [facility]."  Patient states the medicaitions he is taking for withdrawals are helping.  Patient states he is passive SI but verbally contracts for safety.  Patient denies HI and denies AVH. A: Staff to monitor Q 15 mins for safety.  Encouragement and support offered.  Scheduled medications administered per orders. Imodium administered prn for diarrhea.   R: Patient remains safe on the unit.  Patient attended group tonight.  Patient visible on the unit and interacting with peers.  Patient taking administered medications.
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
## 2015-07-22 NOTE — [facility] Group Notes (Signed)
[facility] LCSW Group Therapy  07/22/2015 1:02 PM  Type of Therapy:  Group Therapy  Participation Level:  Did Not Attend-pt chose to remain in bed to rest.   Modes of Intervention:  Confrontation, Discussion, Education, Exploration, Problem-solving, Rapport Building, Socialization and Support  Summary of Progress/Problems: [facility]HA Speaker came to talk about his personal journey with substance abuse and addiction. The pt processed ways by which to relate to the speaker. MHA speaker provided handouts and educational information pertaining to groups and services [facility].   [name], Scott Monda LCSW 07/22/2015, 1:02 PM

## 2015-07-22 NOTE — Progress Notes (Signed)
Patient complaining of withdrawal symptoms early this morning. N/V, shakiness, skin crawling, anxiety and feeling fullness in his head. States he feels he can barely stay awake. Reports chronic back pain is a 7-8/10. Rates his depression at a 10/10, hopelessness at a 9/10 and anxiety at an 8/10. Reports his goal for today is to "hopefully feel better." Patient offered emotional support, medicated per orders. Given ultram, vistaril and zofran prn and orders received to begin detox protocols. Patient's status improved midday. Asleep after pain med admin. CIWA is a "6" at noon and VSS. He endorses passive SI and verbally contracts for safety. Reports AVH as part of his PTSD. Patient oriented x 4. Patient remains safe on level III obs. Will continue to monitor closely. Lawrence MarseillesFriedman, Grant Swager Eakes

## 2015-07-22 NOTE — Progress Notes (Signed)
Patient ID: Scott [name], male   DOB: [birth year], 44 y.o.   MRN: [id number] PER STATE REGULATIONS 482.30  THIS CHART WAS REVIEWED FOR MEDICAL NECESSITY WITH RESPECT TO THE PATIENT'S ADMISSION/DURATION OF STAY.  NEXT REVIEW DATE: 07/25/15  Loura HaltBARBARA Sahithi Ordoyne, RN, BSN CASE MANAGER

## 2015-07-22 NOTE — Progress Notes (Signed)
Recreation Therapy Notes  Animal-Assisted Activity (AAA) Program Checklist/Progress Notes Patient Eligibility Criteria Checklist & Daily Group note for Rec Tx Intervention  Date: 12.13.2016  Time: 2:45pm Location: [address] Dayroom    AAA/T Program Assumption of Risk Form signed by Patient/ or Parent Legal Guardian yes  Patient is free of allergies or sever asthma yes  Patient reports no fear of animals yes  Patient reports no history of cruelty to animals yes  Patient understands his/her participation is voluntary yes  Behavioral Response: Did not attend.   Scott [name], LRT/CTRS        Scott [name] 07/22/2015 3:17 PM 
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
## 2015-07-22 NOTE — Progress Notes (Signed)
Attended group 
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
## 2015-07-22 NOTE — Tx Team (Signed)
Initial Interdisciplinary Treatment Plan   PATIENT STRESSORS: Health problems Legal issue Substance abuse   PATIENT STRENGTHS: Average or above average intelligence General fund of knowledge Motivation for treatment/growth Physical Health Supportive family/friends   PROBLEM LIST: Problem List/Patient Goals Date to be addressed Date deferred Reason deferred Estimated date of resolution  "suicidal thoughts" 12/12   D/c  "PTSD"      "Auditory and visual hallucinations"      "Anxiety      "long term TX"      Substance abuse: heroin and etoh                         DISCHARGE CRITERIA:  Improved stabilization in mood, thinking, and/or behavior Motivation to continue treatment in a less acute level of care Need for constant or close observation no longer present Verbal commitment to aftercare and medication compliance Withdrawal symptoms are absent or subacute and managed without 24-hour nursing intervention  PRELIMINARY DISCHARGE PLAN: IOP  PATIENT/FAMIILY INVOLVEMENT: This treatment plan has been presented to and reviewed with the patient, Scott [name].  The patient and family have been given the opportunity to ask questions and make suggestions.  Paulino Cork A 07/22/2015, 7:33 AM
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
## 2015-07-22 NOTE — [facility] Group Notes (Signed)
[facility] Group Notes:  (Nursing/MHT/Case Management/Adjunct)  Date:  07/22/2015  Time:  0900  Type of Therapy:  Nurse Education  Participation Level:  Did Not Attend  Participation Quality:    Affect:    Cognitive:    Insight:    Engagement in Group:    Modes of Intervention:    Summary of Progress/Problems: Patient invited to group however elected to remain in bed.   Scott [name], Scott [name] [facility] 07/22/2015, 0930

## 2015-07-23 MED ORDER — CITALOPRAM HYDROBROMIDE 20 MG PO TABS
30.0000 mg | ORAL_TABLET | ORAL | Status: DC
  Administered 2015-07-24 – 2015-07-26 (×3): 30 mg via ORAL
  Filled 2015-07-23 (×7): qty 1

## 2015-07-23 MED ORDER — ACAMPROSATE CALCIUM 333 MG PO TBEC
666.0000 mg | DELAYED_RELEASE_TABLET | Freq: Three times a day (TID) | ORAL | Status: DC
  Administered 2015-07-24 – 2015-07-26 (×8): 666 mg via ORAL
  Filled 2015-07-23: qty 2
  Filled 2015-07-23: qty 42
  Filled 2015-07-23 (×9): qty 2
  Filled 2015-07-23: qty 42
  Filled 2015-07-23 (×2): qty 2
  Filled 2015-07-23: qty 42
  Filled 2015-07-23 (×3): qty 2

## 2015-07-23 NOTE — [facility] Group Notes (Signed)
[facility] LCSW Group Therapy  07/23/2015 3:22 PM  Type of Therapy:  Group Therapy  Participation Level:  Did Not Attend-pt invited. Chose to remain in bed.   Summary of Progress/Problems: Emotion Regulation: This group focused on both positive and negative emotion identification and allowed group members to process ways to identify feelings, regulate negative emotions, and find healthy ways to manage internal/external emotions. Group members were asked to reflect on a time when their reaction to an emotion led to a negative outcome and explored how alternative responses using emotion regulation would have benefited them. Group members were also asked to discuss a time when emotion regulation was utilized when a negative emotion was experienced.   Smart, Demontae Antunes LCSW 07/23/2015, 3:22 PM

## 2015-07-23 NOTE — Progress Notes (Signed)
Pt did not attend evening AA group.  

## 2015-07-23 NOTE — Progress Notes (Signed)
[facility] MD Progress Note  07/23/2015 6:21 PM Scott [name]  MRN:  [id number] Subjective:  Scott [name] is still having a hard time. He is afraid of relapsing He still endorses pain. His wife is still supportive but he is afraid she will get tired of his relapses. States he is ruminating about two things alcohol and a gun. Still not trusting himself out there  Principal Problem: Substance induced mood disorder (HCC) Diagnosis:   Patient Active Problem List   Diagnosis Date Noted  . Substance induced mood disorder (HCC) [F19.94] 07/22/2015  . Alcohol dependence with uncomplicated withdrawal (HCC) [F10.230] 07/22/2015  . Drug-seeking behavior [F19.10] 02/16/2015  . MDD (major depressive disorder) (HCC) [F32.9] 12/26/2014  . PTSD (post-traumatic stress disorder) [F43.10] 12/26/2014  . Opioid type dependence, continuous (HCC) [F11.20] 12/26/2014  . Cocaine abuse with cocaine-induced mood disorder (HCC) [F14.14] 12/26/2014  . Major depressive disorder, recurrent episode, moderate (HCC) [F33.1]   . Chest pain at rest [R07.9] 10/22/2013  . 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 Chronic pain syndrome [G89.4] 10/22/2013  . Seizure disorder (HCC) [G40.909] 10/22/2013  . Bipolar disorder, unspecified (HCC) [F31.9] 10/22/2013  . Withdrawal symptoms, drug or narcotic (HCC) [F19.939] 10/22/2013  . Chest pain [R07.9] 10/21/2013  . Leg swelling [M79.89] 10/21/2013   Total Time spent with patient: 20 minutes  Past Psychiatric History: see admission H and P  Past Medical History:  Past Medical History  Diagnosis Date  . Back pain   . Seizures (HCC)   . History of ETOH abuse   . Bipolar 1 disorder (HCC)   . Myocardial infarction (HCC)   . Anxiety   . Depression   . GERD (gastroesophageal reflux disease)   . Chronic back pain   . Opioid dependence (HCC)   . PTSD (post-traumatic stress disorder)     Past Surgical History  Procedure Laterality Date  . Back surgery    .  Appendectomy    . Abdominal surgery     Family History:  Family History  Problem Relation Age of Onset  . Hypertension Mother   . CAD Father   . COPD Father   . Stroke Father   . Testicular cancer Brother    Family Psychiatric  History: see Admission H and  P Social History:  History  Alcohol Use No    Comment: last drink 20 hours ago      History  Drug Use  . Yes  . Special: Cocaine, Heroin    Comment: cocaine less than once a month.     Social History   Social History  . Marital Status: Married    Spouse Name: N/A  . Number of Children: N/A  . Years of Education: N/A   Social History Main Topics  . Smoking status: Heavy Tobacco Smoker -- 1.50 packs/day    Types: Cigarettes  . Smokeless tobacco: Never Used  . Alcohol Use: No     Comment: last drink 20 hours ago   . Drug Use: Yes    Special: Cocaine, Heroin     Comment: cocaine less than once a month.   . Sexual Activity: Yes   Other Topics Concern  . None   Social History Narrative   Additional Social History:                         Sleep: Fair  Appetite:  Fair  Current Medications: Current Facility-Administered Medications  Medication Dose Route Frequency Provider Last  Rate Last Dose  . acetaminophen (TYLENOL) tablet 650 mg  650 mg Oral Q6H PRN Kerry HoughSpencer E Simon, PA-C      . alum & mag hydroxide-simeth (MAALOX/MYLANTA) 200-200-20 MG/5ML suspension 30 mL  30 mL Oral Q4H PRN Kerry HoughSpencer E Simon, PA-C      . busPIRone (BUSPAR) tablet 7.5 mg  7.5 mg Oral TID Kerry HoughSpencer E Simon, PA-C   7.5 mg at 07/23/15 1608  . citalopram (CELEXA) tablet 20 mg  20 mg Oral BH-q7a Spencer E Simon, PA-C   20 mg at 07/23/15 [id number]  . cloNIDine (CATAPRES) tablet 0.1 mg  0.1 mg Oral QID Rachael FeeIrving A Faithann Natal, MD   0.1 mg at 07/23/15 1645   Followed by  . [START ON 07/24/2015] cloNIDine (CATAPRES) tablet 0.1 mg  0.1 mg Oral BH-qamhs Rachael FeeIrving A Shakeema Lippman, MD       Followed by  . [START ON 07/26/2015] cloNIDine (CATAPRES) tablet 0.1 mg  0.1 mg  Oral QAC breakfast Rachael FeeIrving A Adilen Pavelko, MD      . dicyclomine (BENTYL) tablet 20 mg  20 mg Oral Q6H PRN Kerry HoughSpencer E Simon, PA-C      . famotidine (PEPCID) tablet 20 mg  20 mg Oral BID Kerry HoughSpencer E Simon, PA-C   20 mg at 07/23/15 1609  . gabapentin (NEURONTIN) capsule 800 mg  800 mg Oral TID Kerry HoughSpencer E Simon, PA-C   800 mg at 07/23/15 1608  . hydrOXYzine (ATARAX/VISTARIL) tablet 25 mg  25 mg Oral Q6H PRN Rachael FeeIrving A Waverly Chavarria, MD      . hydrOXYzine (ATARAX/VISTARIL) tablet 50 mg  50 mg Oral QHS,MR X 1 Spencer E Simon, PA-C   50 mg at 07/23/15 0145  . loperamide (IMODIUM) capsule 2-4 mg  2-4 mg Oral PRN Kerry HoughSpencer E Simon, PA-C   4 mg at 07/22/15 2009  . LORazepam (ATIVAN) tablet 1 mg  1 mg Oral Q6H PRN Rachael FeeIrving A Habiba Treloar, MD   1 mg at 07/23/15 0615  . [START ON 07/24/2015] LORazepam (ATIVAN) tablet 1 mg  1 mg Oral BID Rachael FeeIrving A Carmelita Amparo, MD       Followed by  . [START ON 07/25/2015] LORazepam (ATIVAN) tablet 1 mg  1 mg Oral Daily Rachael FeeIrving A Tyja Gortney, MD      . magnesium hydroxide (MILK OF MAGNESIA) suspension 30 mL  30 mL Oral Daily PRN Kerry HoughSpencer E Simon, PA-C      . methocarbamol (ROBAXIN) tablet 500 mg  500 mg Oral Q8H PRN Kerry HoughSpencer E Simon, PA-C   500 mg at 07/23/15 1613  . multivitamin with minerals tablet 1 tablet  1 tablet Oral Daily Rachael FeeIrving A Tucker Minter, MD   1 tablet at 07/23/15 0820  . naproxen (NAPROSYN) tablet 500 mg  500 mg Oral BID PRN Rachael FeeIrving A Elfego Giammarino, MD      . nicotine (NICODERM CQ - dosed in mg/24 hours) patch 21 mg  21 mg Transdermal Daily Rachael FeeIrving A Maikayla Beggs, MD   21 mg at 07/23/15 0820  . ondansetron (ZOFRAN-ODT) disintegrating tablet 4 mg  4 mg Oral Q6H PRN Rachael FeeIrving A Emoree Sasaki, MD      . thiamine (B-1) injection 100 mg  100 mg Intramuscular Once Rachael FeeIrving A Jafet Wissing, MD   100 mg at 07/22/15 0915  . thiamine (VITAMIN B-1) tablet 100 mg  100 mg Oral Daily Rachael FeeIrving A Dashton Czerwinski, MD   100 mg at 07/23/15 [id number]  . traMADol (ULTRAM) tablet 50 mg  50 mg Oral Q6H PRN Rachael FeeIrving A Vikrant Pryce, MD   50 mg at 07/23/15 1609  Lab Results: No results found for this or any  previous visit (from the past 48 hour(s)).  Physical Findings: AIMS:  , ,  ,  ,    CIWA:  CIWA-Ar Total: 7 COWS:  COWS Total Score: 4  Musculoskeletal: Strength & Muscle Tone: within normal limits Gait & Station: normal Patient leans: normal  Psychiatric Specialty Exam: Review of Systems  Constitutional: Negative.   HENT: Negative.   Eyes: Negative.   Respiratory: Negative.   Cardiovascular: Negative.   Gastrointestinal: Negative.   Genitourinary: Negative.   Musculoskeletal: Positive for back pain.  Skin: Negative.   Psychiatric/Behavioral: Positive for depression and substance abuse. The patient is nervous/anxious.     Blood pressure 127/67, pulse 75, temperature 98 F (36.7 C), temperature source Oral, resp. rate 18, height 6' (1.829 m), weight 129.729 kg (286 lb).Body mass index is 38.78 kg/(m^2).  General Appearance: Fairly Groomed  Patent attorney::  Fair  Speech:  Clear and Coherent  Volume:  fluctuates  Mood:  Anxious, Depressed and Dysphoric  Affect:  Restricted  Thought Process:  Coherent and Goal Directed  Orientation:  Full (Time, Place, and Person)  Thought Content:  sympotms events worries concerns  Suicidal Thoughts:  "Ruminates about a gun" can contract for safety  Homicidal Thoughts:  No  Memory:  Immediate;   Fair Recent;   Fair Remote;   Fair  Judgement:  Fair  Insight:  Present and Shallow  Psychomotor Activity:  Restlessness  Concentration:  Fair  Recall:  [employer] of Knowledge:Fair  Language: Fair  Akathisia:  No  Handed:  Right  AIMS (if indicated):     Assets:  Desire for Improvement Housing Social Support  ADL's:  Intact  Cognition: WNL  Sleep:  Number of Hours: 6   Treatment Plan Summary: Daily contact with patient to assess and evaluate symptoms and progress in treatment and Medication management Supportive approach/coping skills Alcohol dependence; continue th Ativan Detox protocol/work a relapse prevention plan Alcohol cravings;  will start Campral 333 mg two TID Opioid dependence; continue the clonidine detox protocol Depression; continue the Celexa , increase to 30 mg daily Pain; continue the Neurontin and the Ultram 50 mg PRN Use CBT/mindfulness Quaid Yeakle A 07/23/2015, 6:21 PM

## 2015-07-23 NOTE — Progress Notes (Signed)
Recreation Therapy Notes  Date: 12.14.2016  Time: 9:30am Location: [address] Group Room   Group Topic: Stress Management  Goal Area(s) Addresses:  Patient will actively participate in stress management techniques presented during session.   Behavioral Response: Appropriate   Intervention: Stress management techniques  Activity :  Deep Breathing and Guided Imagery. LRT provided instruction and demonstration on practice of Guided Imagery. Technique was coupled with deep breathing.   Education:  Stress Management, Discharge Planning.   Education Outcome: Acknowledges education  Clinical Observations/Feedback: Patient actively engaged in technique introduced, expressed no concerns and demonstrated ability to practice independently post d/c.   Nelma Phagan L [place] Tapp, LRT/CTRS        Alee Katen L 07/23/2015 2:13 PM 

## 2015-07-23 NOTE — Progress Notes (Signed)
Patient has been in bed most of the day. Disheveled in appearance. Affect anxious with congruent mood. Patient refusing to complete self inventory form. Began to complain of severe back pain late afternoon. Reported numbness in R leg due to spasms. States this happens frequently. Requesting wheelchair which was provided per MD order. MD made aware of patient's report. Medicated per orders. Ultram and robaxin prn given. Offered emotional support. Reviewed fall teaching with patient and he verbalized understanding. On reassess, pain is at a 6/10 and spasms decreased. He denies SI/HI/AVH today. Remains safe on level III obs. Lawrence MarseillesFriedman, Ivannah Zody Eakes

## 2015-07-23 NOTE — Plan of Care (Signed)
Problem: Alteration in mood & ability to function due to Goal: STG-Patient will report withdrawal symptoms Outcome: Progressing Patient's COWS, CIWAs beginning to decrease.  Problem: Diagnosis: Increased Risk For Suicide Attempt Goal: STG-Patient Will Comply With Medication Regime Outcome: Progressing Patient has been med compliant.

## 2015-07-23 NOTE — [facility] Group Notes (Signed)
[facility]   07/23/2015 9:47 AM  Participation Quality:  Invited. DID NOT ATTEND. Pt chose to remain in bed.   Smart, Mykaila Blunck LCSW

## 2015-07-24 DIAGNOSIS — F10239 Alcohol dependence with withdrawal, unspecified: Secondary | ICD-10-CM | POA: Diagnosis present

## 2015-07-24 DIAGNOSIS — F1410 Cocaine abuse, uncomplicated: Secondary | ICD-10-CM

## 2015-07-24 DIAGNOSIS — F1024 Alcohol dependence with alcohol-induced mood disorder: Secondary | ICD-10-CM | POA: Diagnosis present

## 2015-07-24 DIAGNOSIS — F10939 Alcohol use, unspecified with withdrawal, unspecified: Secondary | ICD-10-CM | POA: Diagnosis present

## 2015-07-24 MED ORDER — ADULT MULTIVITAMIN W/MINERALS CH: 1.0000 | ORAL_TABLET | Freq: Every day | ORAL | Status: DC

## 2015-07-24 MED ORDER — LORAZEPAM 1 MG PO TABS
1.0000 mg | ORAL_TABLET | Freq: Four times a day (QID) | ORAL | Status: DC
  Administered 2015-07-24 – 2015-07-25 (×5): 1 mg via ORAL
  Filled 2015-07-24 (×4): qty 1

## 2015-07-24 MED ORDER — DICYCLOMINE HCL 20 MG PO TABS
20.0000 mg | ORAL_TABLET | Freq: Four times a day (QID) | ORAL | Status: DC | PRN
Start: 2015-07-24 — End: 2015-07-26

## 2015-07-24 MED ORDER — CITALOPRAM HYDROBROMIDE 10 MG PO TABS: 30.0000 mg | ORAL_TABLET | ORAL | Status: DC

## 2015-07-24 MED ORDER — THIAMINE HCL 100 MG PO TABS: 100.0000 mg | ORAL_TABLET | Freq: Every day | ORAL | Status: DC

## 2015-07-24 MED ORDER — GABAPENTIN 400 MG PO CAPS: 800.0000 mg | ORAL_CAPSULE | Freq: Three times a day (TID) | ORAL | Status: DC

## 2015-07-24 MED ORDER — BUSPIRONE HCL 7.5 MG PO TABS: 7.5000 mg | ORAL_TABLET | Freq: Three times a day (TID) | ORAL | Status: DC

## 2015-07-24 MED ORDER — FAMOTIDINE 20 MG PO TABS: 20.0000 mg | ORAL_TABLET | Freq: Two times a day (BID) | ORAL | Status: DC

## 2015-07-24 MED ORDER — NICOTINE 21 MG/24HR TD PT24: 21.0000 mg | MEDICATED_PATCH | Freq: Every day | TRANSDERMAL | Status: DC

## 2015-07-24 NOTE — [facility] Group Notes (Signed)
[facility] LCSW Group Therapy  07/24/2015 11:26 AM  Type of Therapy:  Group Therapy  Participation Level:  None  Participation Quality:  Drowsy  Affect:  Lethargic  Cognitive:  Lacking  Insight:  Limited  Engagement in Therapy:  Limited  Modes of Intervention:  Confrontation, Discussion, Education, Exploration, Problem-solving, Rapport Building, Socialization and Support  Summary of Progress/Problems:  Finding Balance in Life. Today's group focused on defining balance in one's own words, identifying things that can knock one off balance, and exploring healthy ways to maintain balance in life. Group members were asked to provide an example of a time when they felt off balance, describe how they handled that situation,and process healthier ways to regain balance in the future. Group members were asked to share the most important tool for maintaining balance that [facility] and how they plan to apply this method after discharge. Scott [name] attended group, stating that he was drowsy due to "not sleeping well at night." Pt fell asleep quickly and did not participate any further. At this time, Scott [name] does not appear to be improving in the group setting.   Smart, Gaje Tennyson LCSW 07/24/2015, 11:26 AM

## 2015-07-24 NOTE — Clinical Social Work Note (Signed)
CSW met with pt individually. Together, called [employer] of court to verify upcoming court dates. Pt instructed by CSW to contact his attorney regarding possibility of going to inpatient treatment out of state. [facility] referrrals made. CSW/pt [facility], to verify that they DO NOT accept Medicare.   Smart, Scott Farro, LCSW 07/24/2015, 10:35 AM

## 2015-07-24 NOTE — Progress Notes (Signed)
D: Pt presents anxious in affect and mood.  Pt's CIWA rated at 10. Majority of pt's CIWA score was due to his anxiety. Pt confronted writer at the begining of the shift for a Toradol injection. Pt was encouraged to use his current medication options first. Pt received 50 mg of tramadol. Pt was asleep at follow-up. Pt denies any suicidal ideation this evening.  A: Writer administered scheduled and prn medications to pt, per MD orders. Continued support and availability as needed was extended to this pt. Staff continues to monitor pt with q1015min checks.  R: No adverse drug reactions noted. Pt receptive to treatment. Pt remains safe at this time.

## 2015-07-24 NOTE — Progress Notes (Signed)
Patient ID: Scott [name], male   DOB: [birth year], 44 y.o.   MRN: [id number]  Adult Psychoeducational Group Note  Date:  07/24/2015 Time: 09:15AM  Group Topic/Focus:  Making Healthy Choices:   The focus of this group is to help patients identify negative/unhealthy choices they were using prior to admission and identify positive/healthier coping strategies to replace them upon discharge.  Participation Level:  Active  Participation Quality:  Appropriate  Affect:  Appropriate  Cognitive:  Appropriate  Insight: Appropriate  Engagement in Group:  Engaged  Modes of Intervention:  Activity, Discussion, Education and Support  Additional Comments:  Pt able to identify one healthy lifestyle/leisure activity in which he can participate post discharge (playing with his children). Pt reports "I don't think I can play music or paint anymore, everyone that likes the same things I do, does drugs too." Pt states "I keep trying to climb the wall and failing" in regards to his recovery.   Aurora Maskwyman, Yaphet Smethurst E 07/24/2015, 10:06 AM

## 2015-07-24 NOTE — Progress Notes (Addendum)
Patient ID: Scott [name], male   DOB: 08-30-1970, 44 y.o.   MRN: [id number]  Pt currently presents with a flat affect and anxious, manipulative behavior. Per self inventory, pt rates depression at a 7, hopelessness 7 and anxiety 8.Pt's daily goal is to "to find an after care plan" and they intend to do so by "make calls and find out any options." Pt reports fair sleep, a fair appetite, high energy and poor concentration.Pt currently endorsing hip and knee pain. Pt reports to writer "I have gotten a steroid or pain medication injection, that has happened before."   Pt provided with medications per providers orders. Pt's labs and vitals were monitored throughout the day. Pt supported emotionally and encouraged to express concerns and questions. Pt educated on medications. Pt currently using wheelchair, reinerated on fall risk precautions.   Pt's safety ensured with 15 minute and environmental checks. Pt endorses passive SI, states [facility]. Pt currently denies HI and A/V hallucinations. Pt verbally agrees to seek staff if HI or A/VH occurs and to consult with staff before acting on any harmful thoughts. Pt reports that he feels as if he is "cursed" and "no matter what path I walk down, it always ends up bad." Will continue POC.  Pt reports increased groin pain that he related to the pain he had after surgery. NP notified of pts pain and concern this afternoon. Will continue to monitor.

## 2015-07-24 NOTE — Progress Notes (Signed)
Patient did not attend the evening karaoke group. Pt remained in bed asleep during group time.
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
## 2015-07-24 NOTE — Plan of Care (Signed)
Problem: Alteration in mood; excessive anxiety as evidenced by: Goal: STG-Pt will report an absence of self-harm thoughts/actions (Patient will report an absence of self-harm thoughts or actions)  Outcome: Progressing Pt denied any suicidal thoughts to writer this evening.

## 2015-07-25 ENCOUNTER — Encounter (HOSPITAL_COMMUNITY): Payer: Self-pay | Admitting: Emergency Medicine

## 2015-07-25 ENCOUNTER — Inpatient Hospital Stay (HOSPITAL_COMMUNITY): Payer: Medicare Other

## 2015-07-25 LAB — I-STAT TROPONIN, ED
Troponin i, poc: 0 ng/mL (ref 0.00–0.08)
Troponin i, poc: 0.01 ng/mL (ref 0.00–0.08)

## 2015-07-25 LAB — BASIC METABOLIC PANEL
Anion gap: 7 (ref 5–15)
BUN: 12 mg/dL (ref 6–20)
CO2: 25 mmol/L (ref 22–32)
Calcium: 8.7 mg/dL — ABNORMAL LOW (ref 8.9–10.3)
Chloride: 105 mmol/L (ref 101–111)
Creatinine, Ser: 0.89 mg/dL (ref 0.61–1.24)
GFR calc Af Amer: >60 mL/min (ref >60)
GFR calc non Af Amer: >60 mL/min (ref >60)
Glucose, Bld: 89 mg/dL (ref 65–99)
Potassium: 3.9 mmol/L (ref 3.5–5.1)
Sodium: 137 mmol/L (ref 135–145)

## 2015-07-25 LAB — CBC
HCT: 41.3 % (ref 39.0–52.0)
Hemoglobin: 14.3 g/dL (ref 13.0–17.0)
MCH: 30 pg (ref 26.0–34.0)
MCHC: 34.6 g/dL (ref 30.0–36.0)
MCV: 86.6 fL (ref 78.0–100.0)
Platelets: 200 10*3/uL (ref 150–400)
RBC: 4.77 MIL/uL (ref 4.22–5.81)
RDW: 13 % (ref 11.5–15.5)
WBC: 10.9 10*3/uL — ABNORMAL HIGH (ref 4.0–10.5)

## 2015-07-25 MED ORDER — LORAZEPAM 1 MG PO TABS
1.0000 mg | ORAL_TABLET | Freq: Three times a day (TID) | ORAL | Status: AC
  Administered 2015-07-25 – 2015-07-26 (×3): 1 mg via ORAL
  Filled 2015-07-25 (×3): qty 1

## 2015-07-25 MED ORDER — NITROGLYCERIN 0.4 MG SL SUBL
SUBLINGUAL_TABLET | SUBLINGUAL | Status: AC
Start: 2015-07-25 — End: 2015-07-25
  Administered 2015-07-25: 0.3 mg via SUBLINGUAL
  Filled 2015-07-25: qty 1

## 2015-07-25 MED ORDER — NITROGLYCERIN 0.4 MG SL SUBL
0.4000 mg | SUBLINGUAL_TABLET | Freq: Once | SUBLINGUAL | Status: AC
  Administered 2015-07-25: 0.4 mg via SUBLINGUAL
  Filled 2015-07-25: qty 1

## 2015-07-25 MED ORDER — NITROGLYCERIN 0.4 MG SL SUBL: 0.4000 mg | SUBLINGUAL_TABLET | SUBLINGUAL | Status: DC | PRN

## 2015-07-25 MED ORDER — ACETAMINOPHEN 325 MG PO TABS
650.0000 mg | ORAL_TABLET | Freq: Once | ORAL | Status: AC
  Administered 2015-07-25: 650 mg via ORAL
  Filled 2015-07-25: qty 2

## 2015-07-25 MED ORDER — NITROGLYCERIN 0.3 MG SL SUBL
0.3000 mg | SUBLINGUAL_TABLET | SUBLINGUAL | Status: DC | PRN
  Administered 2015-07-25: 0.3 mg via SUBLINGUAL

## 2015-07-25 MED ORDER — MORPHINE SULFATE (PF) 4 MG/ML IV SOLN
4.0000 mg | Freq: Once | INTRAVENOUS | Status: DC
  Filled 2015-07-25: qty 1

## 2015-07-25 NOTE — ED Notes (Signed)
Pt refused second nitro

## 2015-07-25 NOTE — Discharge Instructions (Signed)
You were seen in the ER today for evaluation of chest pain. Your workup was unremarkable. Please follow up with your primary care provider for further evaluation of your recurrent chest pain.
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
## 2015-07-25 NOTE — ED Notes (Signed)
Spoke with ED PA Sam, states it is okay to give meds that was ordered for his behavioral health treatment plan PTA

## 2015-07-25 NOTE — ED Notes (Signed)
Onset today left chest pain 8/10 radiating to left neck and left upper extremity while walking in hallway while [facility]TOH and SI. Nurse administered one nitro with no relief EMS called and administered aspirin 324 mg PO. [facility] with [facility] and [facility].

## 2015-07-25 NOTE — [facility] Group Notes (Signed)
[facility] LCSW Group Therapy  07/25/2015 12:49 PM  Type of Therapy:  Group Therapy  Participation Level:  Did Not Attend-[facility] due to [facility] pain and irregular EKG. Excused.   Modes of Intervention:  Confrontation, Discussion, Education, Exploration, Problem-solving, Rapport Building, Socialization and Support  Summary of Progress/Problems: Feelings around Relapse. Group members discussed the meaning of relapse and shared personal stories of relapse, how it affected them and others, and how they perceived themselves during this time. Group members were encouraged to identify triggers, warning signs and coping skills used when facing the possibility of relapse. Social supports were discussed and explored in detail. Post Acute Withdrawal Syndrome (handout provided) was introduced and examined. Pt's were encouraged to ask questions, talk about key points associated with PAWS, and process this information in terms of relapse prevention.   Smart, Scott Lightsey LCSW 07/25/2015, 12:49 PM

## 2015-07-25 NOTE — Progress Notes (Signed)
Patient c/o chest pain after lunch.  Patient states that he has a hx of MIs; could not remember the date of the last one.  Patient was given nitro at approx 1400.  Patient continued to complain of chest pain 15 minutes after administration.  Patient's chest was shaved and EKG was administered.  EKG was given to provider who requested that [facility].  Patient's wife was contacted and patient transferred via EMS.

## 2015-07-25 NOTE — [facility] Suicide Risk Assessment (Signed)
[facility] INPATIENT:  Family/Significant Other Suicide Prevention Education  Suicide Prevention Education:  Education Completed; Mollie GermanyCatherine Ramthun (pt's wife) [phone] has been identified by the patient as the family member/significant other with whom the patient will be residing, and identified as the person(s) who will aid the patient in the event of a mental health crisis (suicidal ideations/suicide attempt).  With written consent from the patient, the family member/significant other has been provided the following suicide prevention education, prior to the and/or following the discharge of the patient.  The suicide prevention education provided includes the following:  Suicide risk factors  Suicide prevention and interventions  National Suicide Hotline telephone number  [facility] assessment telephone number  [facility] Emergency Assistance 911  [facility] and/or Residential Mobile Crisis Unit telephone number  Request made of family/significant other to:  Remove weapons (e.g., guns, rifles, knives), all items previously/currently identified as safety concern.    Remove drugs/medications (over-the-counter, prescriptions, illicit drugs), all items previously/currently identified as a safety concern.  The family member/significant other verbalizes understanding of the suicide prevention education information provided.  The family member/significant other agrees to remove the items of safety concern listed above.  Smart, Eveleigh Crumpler LCSW 07/25/2015, 11:13 AM

## 2015-07-25 NOTE — [facility] Group Notes (Signed)
[facility]   07/25/2015 9:43 AM  Participation Quality:  Appropriate   Mood/Affect:  Appropriate  Depression Rating:  4  Anxiety Rating:  1  Thoughts of Suicide:  No Will you contract for safety?   NA  Current AVH: No   Plan for Discharge/Comments:  Pt reports that he is ready for d/c on Saturday. Pt spoke with attorney and cannot go to inpatient treatment due to court dates. Pt plans to return home and [facility]. Pt goes to AA near his home.   Transportation Means: wife will pick him up Sat Afternoon   Supports: Wife  Smart, Herbert SetaHeather LCSW

## 2015-07-25 NOTE — Progress Notes (Signed)
D: Pt presents anxious in affect. Per pt, he is no longer withdrawing from the opiates. Pt relates his current withdrawal to etoh use. Pt was asleep for the majority of the evening. Pt reports increased strength in his legs. Pt was observed by another staff member having a night terror. Pt previously reported a hx of night terrors to Clinical research associatewriter.  A: Writer administered scheduled and prn medications to pt, per MD orders. Continued support and availability as needed was extended to this pt. Staff continues to monitor pt with q7715min checks.  R: No adverse drug reactions noted. Pt receptive to treatment. Pt remains safe at this time.

## 2015-07-25 NOTE — Tx Team (Signed)
Interdisciplinary Treatment Plan Update (Adult)  Date:  07/25/2015  Time Reviewed:  11:15 AM   Progress in Treatment: Attending groups: Yes Participating in groups: Yes Taking medication as prescribed:  Yes. Tolerating medication:  Yes. Family/Significant othe contact made:  SPE completed with pt's wife.  Patient understands diagnosis:  Yes. and As evidenced by:  seeking treatment for SI/wife found him with gun in mouth, depression, hopelessness, mood instability, and for medication stabilization. Discussing patient identified problems/goals with staff:  Yes. Medical problems stabilized or resolved:  Yes Denies suicidal/homicidal ideation: Yes Issues/concerns per patient self-inventory:  Other:  Discharge Plan or Barriers: Pt plans to return home and follow-up at [facility]. Per pt's attorney, He CANNOT go to inpatient treatment until all court dates have been attended. Pt given information to [facility].   Reason for Continuation of Hospitalization: none  Comments:  Scott [name] is an 44 y.o. male who was brought to the Emergency Department by his wife after she walked in on him with a gun in his mouth. He states that he has been feeling hopeless and admits to putting a gun in his mouth with intentions to kill himself. He states that if she had not walked in he would have pulled the trigger. 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 He states that he has flashbacks, nightmares and visual/auditory/olfactory hallucinations at times related to what he has seen. He states that he sometimes "dissociates" from himself and doesn't remember chunks of time. He has history of chronic pain related to injuries he sustained while he was in service. He states that he was started on pain management for these issues which turned  into abuse which turned into heroin addiction. He states that he is using heroin IV daily and drinks around 1/5th of liquor a day as well. He states the last time he used was 24 hours ago. He states that he has a supportive family and loves his wife and kids, however he can't help but feel worthless and that "they would be better off without him". He has been admitted to [facility] in the past for SI and has tried to get help in the past. He states that he wants to find what works and he is willing to try anything. No HI noted. Diagnosis: Post Traumatic Stress Disorder, Opiate use disorder, severe, Major Depressive Disorder single episode severe   Estimated length of stay:  Saturday d/c per MD (pt's wife will pick him up after lunch)     Additional Comments:  Patient and CSW reviewed pt's identified goals and treatment plan. Patient verbalized understanding and agreed to treatment plan. CSW reviewed [facility] "Discharge Process and Patient Involvement" Form. Pt verbalized understanding of information provided and signed form.    Review of initial/current patient goals per problem list:  1. Goal(s): Patient will participate in aftercare plan  Met: Yes  Target date: at discharge  As evidenced by: Patient will participate within aftercare plan AEB aftercare provider and housing plan at discharge being identified.  12/13: Pt reports that he lives at home with his wife and has hx with [facility] for outpatient care. CSW assessing.   12/16: Pt plans to return home; follow-up at [facility].   2. Goal (s): Patient will exhibit decreased depressive symptoms and suicidal ideations.  Met: Yes  Target date: at discharge  As evidenced by: Patient will utilize self rating of depression at 3 or below and demonstrate decreased signs of depression or be deemed stable for discharge by MD.  12/13: Pt reports high depression and SI/Able to contract for safety on the unit. No HI/AVH.   12/16: PT rates depression 4/10 and  states that "this is normal for me." Pt appears to be presenting at his baseline and reports no SI/HI/AVH.   3. Goal(s): Patient will demonstrate decreased signs of withdrawal due to substance abuse  Met:Yes  Target date:at discharge   As evidenced by: Patient will produce a CIWA/COWS score of 0, have stable vitals signs, and no symptoms of withdrawal.  12/13: Pt reports mild/moderate withdrawals with CIWA score of 3 and high BP. Goal progressing.   12/16: Pt reports no signs of withdrawal with CIWA score not taken and stable vitals. Goal not met.   Attendees: Patient:   07/25/2015 11:15 AM   Family:   07/25/2015 11:15 AM   Physician:  Dr. Shea Evans; Dr. Parke Poisson MD  07/25/2015 11:15 AM   Nursing:   Tobe Sos RN 07/25/2015 11:15 AM   Clinical Social Worker: Maxie Better, LCSW 07/25/2015 11:15 AM   Clinical Social Worker: Erasmo Downer Drinkard LCSWA; Peri Maris LCSWA 07/25/2015 11:15 AM   Other:  Gerline Legacy Nurse Case Manager 07/25/2015 11:15 AM   Other:  07/25/2015 11:15 AM   Other:   07/25/2015 11:15 AM   Other:  07/25/2015 11:15 AM   Other:  07/25/2015 11:15 AM   Other:  07/25/2015 11:15 AM    07/25/2015 11:15 AM    07/25/2015 11:15 AM    07/25/2015 11:15 AM    07/25/2015 11:15 AM    Scribe for Treatment Team:   Maxie Better, LCSW 07/25/2015 11:15 AM

## 2015-07-25 NOTE — Progress Notes (Signed)
Patient ID: Scott [name], male   DOB: 05-28-1971, 44 y.o.   MRN: [id number] PER STATE REGULATIONS 482.30  THIS CHART WAS REVIEWED FOR MEDICAL NECESSITY WITH RESPECT TO THE PATIENT'S ADMISSION/ DURATION OF STAY.  NEXT REVIEW DATE:   07/29/2015  Willa RoughJENNIFER JONES Danthony Kendrix, RN, BSN CASE MANAGER

## 2015-07-25 NOTE — Progress Notes (Signed)
  [facility] Discharge [facility] :  Will you be returning to the same living situation after discharge:  Yes,  home At discharge, do you have transportation home?: Yes,  pt's wife will pick up pt Saturday 12/17 afternoon Do you have the ability to pay for your medications: Yes,  Medicare  Release of information consent forms completed and submitted to medical records by CSW.   Patient to Follow up at: Follow-up Information    Follow up with [facility].   Why:  Walk in between 8:30AM-3PM for hospital follow-up/medication management/counseling services.    Contact information:   [address] [place], [place] [address] Phone: [phone] Fax: [phone]      Next level of care provider [facility] Link:no  Patient denies SI/HI: Yes,  during group/self report.     Safety Planning and Suicide Prevention discussed: Yes,  SPE completed with pt's wife. SPI pamphlet and mobile crisis information provided to pt and he was encouraged to share this information with support networkl.  Have you used any form of tobacco in the last 30 days? (Cigarettes, Smokeless Tobacco, Cigars, and/or Pipes): No  Has patient been referred to the Quitline?: N/A patient is not a smoker  Patient has been referred for addiction treatment: Yes-see above.   Smart, Kody Brandl LCSW 07/25/2015, 11:14 AM

## 2015-07-25 NOTE — ED Provider Notes (Signed)
CSN: [id number]     Arrival date & time 07/25/15  1449 History   First MD Initiated Contact with Patient 07/25/15 1500     Chief Complaint  Patient presents with  . Chest Pain    HPI   Scott [name] is an 44 y.o. male with history of reported MI in [place] (2012), chronic pain, seizures, polysubstance abuse who presents as a transfer from [facility] for evaluation of chest pain. He states he was walking in the hallway about 1h PTA when he had sudden onset substernal chest pain/pressure. He states the pain radiates up to his left neck. Endorses initial diaphoresis which has now resolved. Endorses nausea but denies emesis. Denies fever, chills, SOB, abdominal pain. He reports [facility] The nitro gives him a headache. He states he has had an MI in the past four years ago and this feels somewhat similar to then. Pt was admitted in 10/2013 for a similar presentation with negative cardiac workup including CTA chest, echo, and nuclear stress test.   Past Medical History  Diagnosis Date  . Back pain   . Seizures ([place]   . History of ETOH abuse   . Bipolar 1 disorder ([place]   . Myocardial infarction ([place]   . Anxiety   . Depression   . GERD (gastroesophageal reflux disease)   . Chronic back pain   . Opioid dependence ([place]   . PTSD (post-traumatic stress disorder)    Past Surgical History  Procedure Laterality Date  . Back surgery    . Appendectomy    . Abdominal surgery     Family History  Problem Relation Age of Onset  . Hypertension Mother   . CAD Father   . COPD Father   . Stroke Father   . Testicular cancer Brother    Social History  Substance Use Topics  . Smoking status: Heavy Tobacco Smoker -- 1.50 packs/day    Types: Cigarettes  . Smokeless tobacco: Never Used  . Alcohol Use: No     Comment: last drink 20 hours ago     Review of Systems  All other systems reviewed and are negative.     Allergies  Benadryl; Celecoxib;  Diphenhydramine; Prednisone; Risperidone and related; Nsaids; Other; Rofecoxib; Trazodone and nefazodone; Esomeprazole; Ibuprofen; Latex; and Paliperidone palmitate  Home Medications   Prior to Admission medications   Medication Sig Start Date End Date Taking? Authorizing Provider  busPIRone (BUSPAR) 7.5 MG tablet Take 1 tablet (7.5 mg total) by mouth 3 (three) times daily. 01/02/15  Yes LNiel Hummer NP  furosemide (LASIX) 20 MG tablet Take 1 tablet (20 mg total) by mouth daily. 01/02/15  Yes LNiel Hummer NP  potassium chloride (K-DUR,KLOR-CON) 10 MEQ tablet Take 1 tablet (10 mEq total) by mouth daily. To prevent low potassium from diuretic usage. Patient taking differently: Take 10 mEq by mouth daily.  01/02/15  Yes LNiel Hummer NP  ranitidine (ZANTAC) 150 MG tablet Take 150 mg by mouth 2 (two) times daily.   Yes Historical Provider, MD  traMADol (ULTRAM) 50 MG tablet Take 50 mg by mouth 3 (three) times daily.   Yes Historical Provider, MD  busPIRone (BUSPAR) 7.5 MG tablet Take 1 tablet (7.5 mg total) by mouth 3 (three) times daily. 07/24/15   TDerrill Center NP  citalopram (CELEXA) 10 MG tablet Take 3 tablets (30 mg total) by mouth every morning. 07/24/15   TLudger Nutting  Malachy Chamber, NP  citalopram (CELEXA) 20 MG tablet Take 20 mg by mouth every morning.    Historical Provider, MD  dicyclomine (BENTYL) 20 MG tablet Take 1 tablet (20 mg total) by mouth every 6 (six) hours as needed for spasms (abdominal cramping). 07/24/15   Derrill Center, NP  famotidine (PEPCID) 20 MG tablet Take 1 tablet (20 mg total) by mouth 2 (two) times daily. 07/24/15   Derrill Center, NP  gabapentin (NEURONTIN) 400 MG capsule Take 2 capsules (800 mg total) by mouth 3 (three) times daily. 01/02/15   Niel Hummer, NP  gabapentin (NEURONTIN) 400 MG capsule Take 2 capsules (800 mg total) by mouth 3 (three) times daily. 07/24/15   Derrill Center, NP  loperamide (IMODIUM A-D) 2 MG tablet Take 2 tablets (4 mg total) by mouth 4 (four)  times daily as needed for diarrhea or loose stools. 02/23/15   Carrie Mew, MD  Multiple Vitamin (MULTIVITAMIN WITH MINERALS) TABS tablet Take 1 tablet by mouth daily. 07/24/15   Derrill Center, NP  Naloxone HCl 0.4 MG/0.4ML SOAJ Inject 1 application as directed once. Dispense 1 naloxone autoinjector kit 02/25/15   Delman Kitten, MD  nicotine (NICODERM CQ - DOSED IN MG/24 HOURS) 21 mg/24hr patch Place 1 patch (21 mg total) onto the skin daily. 07/24/15   Derrill Center, NP  ondansetron (ZOFRAN ODT) 8 MG disintegrating tablet Take 1 tablet (8 mg total) by mouth every 8 (eight) hours as needed for nausea or vomiting. 02/23/15   Carrie Mew, MD  thiamine 100 MG tablet Take 1 tablet (100 mg total) by mouth daily. 07/24/15   Derrill Center, NP  traZODone (DESYREL) 50 MG tablet Take 1 tablet (50 mg total) by mouth at bedtime. 01/02/15   Niel Hummer, NP   BP 116/66 mmHg  Pulse 78  Temp(Src) 97.7 F (36.5 C) (Oral)  Resp 18  Ht 6' (1.829 m)  Wt 129.729 kg  BMI 38.78 kg/m2  SpO2 100% Physical Exam  Constitutional: He is oriented to person, place, and time. No distress.  HENT:  Right Ear: External ear normal.  Left Ear: External ear normal.  Nose: Nose normal.  Mouth/Throat: Oropharynx is clear and moist. No oropharyngeal exudate.  Eyes: Conjunctivae and EOM are normal. Pupils are equal, round, and reactive to light.  Neck: Normal range of motion. Neck supple.  Cardiovascular: Normal rate, regular rhythm, normal heart sounds and intact distal pulses.   Pulmonary/Chest: Effort normal and breath sounds normal. No respiratory distress. He has no wheezes. He exhibits no tenderness.  Abdominal: Soft. Bowel sounds are normal. He exhibits no distension. There is no tenderness.  Musculoskeletal: He exhibits no edema.  Neurological: He is alert and oriented to person, place, and time. No cranial nerve deficit.  Skin: Skin is warm and dry. He is not diaphoretic.  Psychiatric: He has a normal mood  and affect.  Nursing note and vitals reviewed.   ED Course  Procedures (including critical care time) Labs Review Labs Reviewed  BASIC METABOLIC PANEL - Abnormal; Notable for the following:    Calcium 8.7 (*)    All other components within normal limits  CBC - Abnormal; Notable for the following:    WBC 10.9 (*)    All other components within normal limits  I-STAT TROPOININ, ED  Randolm Idol, ED    Imaging Review Dg Chest 2 View  07/25/2015  CLINICAL DATA:  Shortness of breath and chest pain EXAM: CHEST  2 VIEW COMPARISON:  February 23, 2015 FINDINGS: There is no edema or consolidation. Heart size and pulmonary vascularity are normal. No adenopathy. No pneumothorax. There is stable bony overgrowth along the anterior right first rib. IMPRESSION: No edema or consolidation. Electronically Signed   By: Lowella Grip III M.D.   On: 07/25/2015 15:49   I have personally reviewed and evaluated these images and lab results as part of my medical decision-making.   EKG Interpretation   Date/Time:  Friday July 25 2015 14:59:12 EST Ventricular Rate:  74 PR Interval:  137 QRS Duration: 113 QT Interval:  407 QTC Calculation: 451 R Axis:   111 Text Interpretation:  Sinus rhythm Borderline intraventricular conduction  delay Low voltage, extremity and precordial leads ST elev, probable normal  early repol pattern No significant change since last tracing Confirmed by  FLOYD MD, Quillian Quince ([id number]) on 07/25/2015 3:03:54 PM      MDM   Final diagnoses:  Atypical chest pain   Nuclear stress in 01/2015 at [facility] negative. Numerous ED visits for atypical chest pain, often while in detox. Pt denies h/o opioid abuse to me, states he is at [facility] for EtOH only. Will hold off on morphine for now. SL nitro and APAP given. Pt requesting more meds. States he has only tried heroin once 10 years ago and he does not understand why we won't give him meds.  Spoke to [facility]. Will delta troponin. They will accept  pt back even with active chest pain if he is otherwise medically clear. Will give pt his scheduled meds here as well since he will be here several hours.  Pt requesting to go back to [facility]. Delta troponin is still pending. As soon as negative trop is back will transfer back to [facility].  Trop x 2 negative. Transfer initiated back to [facility].   Anne Ng, PA-C 07/25/15 [place], DO 07/26/15 [phone]

## 2015-07-25 NOTE — Progress Notes (Signed)
Adult Psychoeducational Group Note  Date:  07/25/2015 Time:  1:34 PM  Group Topic/Focus:  Relapse Prevention Planning:   The focus of this group is to define relapse and discuss the need for planning to combat relapse.  Participation Level:  Active  Participation Quality:  Appropriate  Affect:  Appropriate  Cognitive:  Appropriate  Insight: Good  Engagement in Group:  Engaged  Modes of Intervention:  Discussion  Additional Comments:  Pt. Trigger is his health. Pt. Goal is to find his happiness again and think positive about himself.   Teena IraniBartlett, [name] 07/25/2015, 1:34 PMAdult Psychoeducational Group Note  Date:  07/25/2015 Time:  1:32 PM  Group Topic/Focus:  Relapse Prevention Planning:   The focus of this group is to define relapse and discuss the need for planning to combat relapse.  Participation Level:  Active  Participation Quality:  Appropriate  Affect:  Appropriate  Cognitive:  Appropriate  Insight: Good  Engagement in Group:  Engaged  Modes of Intervention:  Discussion  Additional Comments:    Teena IraniBartlett, [name] 07/25/2015, 1:32 PM

## 2015-07-26 DIAGNOSIS — F331 Major depressive disorder, recurrent, moderate: Secondary | ICD-10-CM

## 2015-07-26 DIAGNOSIS — F10230 Alcohol dependence with withdrawal, uncomplicated: Secondary | ICD-10-CM

## 2015-07-26 DIAGNOSIS — F4310 Post-traumatic stress disorder, unspecified: Principal | ICD-10-CM

## 2015-07-26 DIAGNOSIS — F1120 Opioid dependence, uncomplicated: Secondary | ICD-10-CM

## 2015-07-26 MED ORDER — HYDROXYZINE HCL 25 MG PO TABS
25.0000 mg | ORAL_TABLET | Freq: Four times a day (QID) | ORAL | Status: DC | PRN
  Filled 2015-07-26: qty 10

## 2015-07-26 MED ORDER — NICOTINE 21 MG/24HR TD PT24: 21.0000 mg | MEDICATED_PATCH | Freq: Every day | TRANSDERMAL | Status: DC

## 2015-07-26 MED ORDER — GABAPENTIN 400 MG PO CAPS: 800.0000 mg | ORAL_CAPSULE | Freq: Three times a day (TID) | ORAL | Status: DC

## 2015-07-26 MED ORDER — ACAMPROSATE CALCIUM 333 MG PO TBEC: 666.0000 mg | DELAYED_RELEASE_TABLET | Freq: Three times a day (TID) | ORAL | Status: DC

## 2015-07-26 MED ORDER — CITALOPRAM HYDROBROMIDE 10 MG PO TABS
30.0000 mg | ORAL_TABLET | Freq: Every day | ORAL | Status: DC
  Filled 2015-07-26: qty 3

## 2015-07-26 MED ORDER — RANITIDINE HCL 150 MG PO TABS: 150.0000 mg | ORAL_TABLET | Freq: Two times a day (BID) | ORAL | Status: DC

## 2015-07-26 MED ORDER — CITALOPRAM HYDROBROMIDE 10 MG PO TABS: 30.0000 mg | ORAL_TABLET | ORAL | Status: DC

## 2015-07-26 MED ORDER — HYDROXYZINE HCL 25 MG PO TABS: 25.0000 mg | ORAL_TABLET | Freq: Four times a day (QID) | ORAL | Status: DC | PRN

## 2015-07-26 MED ORDER — LORAZEPAM 1 MG PO TABS: 1.0000 mg | ORAL_TABLET | Freq: Three times a day (TID) | ORAL | Status: DC

## 2015-07-26 MED ORDER — BUSPIRONE HCL 7.5 MG PO TABS: 7.5000 mg | ORAL_TABLET | Freq: Three times a day (TID) | ORAL | Status: DC

## 2015-07-26 NOTE — BHH Suicide Risk Assessment (Signed)
[facility] Discharge Suicide Risk Assessment   Demographic Factors:  Male  Total Time spent with patient: 30 minutes  Musculoskeletal: Strength & Muscle Tone: within normal limits Gait & Station: normal Patient leans: N/A  Psychiatric Specialty Exam: Physical Exam  Constitutional: No distress.    Review of Systems  Cardiovascular: Negative for chest pain.    Blood pressure 135/81, pulse 79, temperature 98 F (36.7 C), temperature source Oral, resp. rate 20, height 6' (1.829 m), weight 129.729 kg (286 lb), SpO2 100 %.Body mass index is 38.78 kg/(m^2).  General Appearance: Casual  Eye Contact::  Fair  Speech:  Slow409  Volume:  Normal  Mood:  Euthymic  Affect:  Constricted  Thought Process:  Coherent  Orientation:  Full (Time, Place, and Person)  Thought Content:  Rumination  Suicidal Thoughts:  No  Homicidal Thoughts:  No  Memory:  Immediate;   Fair Recent;   Fair  Judgement:  Fair  Insight:  Shallow  Psychomotor Activity:  Normal  Concentration:  Fair  Recall:  [employer] of Knowledge:Fair  Language: Fair  Akathisia:  Negative  Handed:  Right  AIMS (if indicated):     Assets:  Desire for Improvement  Sleep:  Number of Hours: 6.5  Cognition: WNL  ADL's:  Intact   Have you used any form of tobacco in the last 30 days? (Cigarettes, Smokeless Tobacco, Cigars, and/or Pipes): No  Has this patient used any form of tobacco in the last 30 days? (Cigarettes, Smokeless Tobacco, Cigars, and/or Pipes) N/A  Mental Status Per Nursing Assessment::   On Admission:  Suicidal ideation indicated by patient  Current Mental Status by Physician: see MSE above  Loss Factors: Financial problems/change in socioeconomic status  Historical Factors: Impulsivity  Risk Reduction Factors:   Positive therapeutic relationship  Continued Clinical Symptoms:  Depression:   Anhedonia Alcohol/Substance Abuse/Dependencies More than one psychiatric diagnosis  Cognitive Features That  Contribute To Risk:  Closed-mindedness    Suicide Risk:  Minimal: No identifiable suicidal ideation.  Patients presenting with no risk factors but with morbid ruminations; may be classified as minimal risk based on the severity of the depressive symptoms  Principal Problem: PTSD (post-traumatic stress disorder) Discharge Diagnoses:  Patient Active Problem List   Diagnosis Date Noted  . Alcohol use disorder, severe, dependence (HCC) [F10.20] 07/24/2015  . Alcohol withdrawal (HCC) [F10.239] 07/24/2015  . Cocaine use disorder, mild, abuse [F14.10] 07/24/2015  . Substance induced mood disorder (HCC) [F19.94] 07/22/2015  . Drug-seeking behavior [F19.10] 02/16/2015  . PTSD (post-traumatic stress disorder) [F43.10] 12/26/2014  . Opioid type dependence, continuous (HCC) [F11.20] 12/26/2014  . Major depressive disorder, recurrent episode, moderate (HCC) [F33.1]   . Chest pain at rest [R07.9] 10/22/2013  . Chronic pain syndrome [G89.4] 10/22/2013  . Seizure disorder (HCC) [G40.909] 10/22/2013  . Chest pain [R07.9] 10/21/2013    Follow-up Information    Follow up with [facility].   Why:  Walk in between 8:30AM-3PM for hospital follow-up/medication management/counseling services.    Contact information:   [address] [place], [place] [address] Phone: [phone] Fax: [phone]      Plan Of Care/Follow-up recommendations:  Activity:  as tolerated Diet:  regular Follow up with appointments and medication compliance. Regular follow up with primary care and cardiologist. No chest pain today.  Patient showing understanding of avoiding triggers and substance abuse.  Is patient on multiple antipsychotic therapies at discharge:  No   Has Patient had three or more failed trials of antipsychotic monotherapy  by history:  No  Recommended Plan for Multiple Antipsychotic Therapies: NA    Betsi Crespi 07/26/2015, 10:29 AM

## 2015-07-26 NOTE — Progress Notes (Signed)
Patient ID: Scott [name], male   DOB: 1971/07/28, 44 y.o.   MRN: [id number]   Pt reported that he was feeling much better and that he was ready for discharge. Pt reported that he started to see the positives in everything and realized [facility]. Pt reported being negative SI/HI, no AH/VH noted. Pt was given all discharge instructions, no issues or concerns noted.
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
## 2015-07-26 NOTE — Progress Notes (Signed)
D: Pt is alert and oriented x4. Pt endorses severe depression of 8 on a 0-10 depression scale and severe anxiety of 8 on a 0-10 anxiety scale. Pt also complained of severe back pain of 7 on a 0-10 pain scale. He states, "My backs are really bad." Pt endorses passive SI however, able to contract for safety. Pt denies HI and AVH. Pt was very irritable; however, nonviolent through the shift assessment. A: Medications offered as prescribed.  Support, encouragement, and safe environment provided.  15-minute safety checks continue. R: Pt was med compliant, could be med seeking.  Pt did not attend AA group. Safety checks continue.

## 2015-09-20 DIAGNOSIS — H93233 Hyperacusis, bilateral: Secondary | ICD-10-CM | POA: Diagnosis not present

## 2015-09-20 DIAGNOSIS — S6991XA Unspecified injury of right wrist, hand and finger(s), initial encounter: Secondary | ICD-10-CM | POA: Diagnosis not present

## 2015-09-20 DIAGNOSIS — H539 Unspecified visual disturbance: Secondary | ICD-10-CM | POA: Diagnosis not present

## 2015-09-20 DIAGNOSIS — M542 Cervicalgia: Secondary | ICD-10-CM | POA: Diagnosis not present

## 2015-10-22 DIAGNOSIS — Z5321 Procedure and treatment not carried out due to patient leaving prior to being seen by health care provider: Secondary | ICD-10-CM | POA: Diagnosis not present

## 2015-10-22 DIAGNOSIS — Z7689 Persons encountering health services in other specified circumstances: Secondary | ICD-10-CM | POA: Diagnosis not present

## 2015-10-31 DIAGNOSIS — E785 Hyperlipidemia, unspecified: Secondary | ICD-10-CM | POA: Diagnosis not present

## 2015-10-31 DIAGNOSIS — F17210 Nicotine dependence, cigarettes, uncomplicated: Secondary | ICD-10-CM | POA: Diagnosis not present

## 2015-10-31 DIAGNOSIS — Z9114 Patient's other noncompliance with medication regimen: Secondary | ICD-10-CM | POA: Diagnosis not present

## 2015-10-31 DIAGNOSIS — F332 Major depressive disorder, recurrent severe without psychotic features: Secondary | ICD-10-CM | POA: Diagnosis not present

## 2015-10-31 DIAGNOSIS — F6089 Other specific personality disorders: Secondary | ICD-10-CM | POA: Diagnosis not present

## 2015-10-31 DIAGNOSIS — G40909 Epilepsy, unspecified, not intractable, without status epilepticus: Secondary | ICD-10-CM | POA: Diagnosis not present

## 2015-10-31 DIAGNOSIS — Z91040 Latex allergy status: Secondary | ICD-10-CM | POA: Diagnosis not present

## 2015-10-31 DIAGNOSIS — I252 Old myocardial infarction: Secondary | ICD-10-CM | POA: Diagnosis not present

## 2015-10-31 DIAGNOSIS — F1120 Opioid dependence, uncomplicated: Secondary | ICD-10-CM | POA: Diagnosis not present

## 2015-10-31 DIAGNOSIS — F4310 Post-traumatic stress disorder, unspecified: Secondary | ICD-10-CM | POA: Diagnosis not present

## 2015-10-31 DIAGNOSIS — Z79899 Other long term (current) drug therapy: Secondary | ICD-10-CM | POA: Diagnosis not present

## 2015-10-31 DIAGNOSIS — F419 Anxiety disorder, unspecified: Secondary | ICD-10-CM | POA: Diagnosis not present

## 2015-11-01 DIAGNOSIS — Z9114 Patient's other noncompliance with medication regimen: Secondary | ICD-10-CM | POA: Diagnosis not present

## 2015-11-01 DIAGNOSIS — R45851 Suicidal ideations: Secondary | ICD-10-CM | POA: Diagnosis not present

## 2015-11-01 DIAGNOSIS — F319 Bipolar disorder, unspecified: Secondary | ICD-10-CM | POA: Diagnosis not present

## 2015-11-01 DIAGNOSIS — F1120 Opioid dependence, uncomplicated: Secondary | ICD-10-CM | POA: Diagnosis not present

## 2015-11-02 DIAGNOSIS — F1123 Opioid dependence with withdrawal: Secondary | ICD-10-CM | POA: Diagnosis not present

## 2015-11-03 DIAGNOSIS — F1120 Opioid dependence, uncomplicated: Secondary | ICD-10-CM | POA: Diagnosis not present

## 2015-12-10 DIAGNOSIS — F1110 Opioid abuse, uncomplicated: Secondary | ICD-10-CM | POA: Diagnosis not present

## 2015-12-10 DIAGNOSIS — T50904A Poisoning by unspecified drugs, medicaments and biological substances, undetermined, initial encounter: Secondary | ICD-10-CM | POA: Diagnosis not present

## 2015-12-10 DIAGNOSIS — F319 Bipolar disorder, unspecified: Secondary | ICD-10-CM | POA: Diagnosis not present

## 2015-12-10 DIAGNOSIS — F17210 Nicotine dependence, cigarettes, uncomplicated: Secondary | ICD-10-CM | POA: Diagnosis not present

## 2015-12-10 DIAGNOSIS — F1410 Cocaine abuse, uncomplicated: Secondary | ICD-10-CM | POA: Diagnosis not present

## 2015-12-10 DIAGNOSIS — Z888 Allergy status to other drugs, medicaments and biological substances status: Secondary | ICD-10-CM | POA: Diagnosis not present

## 2015-12-10 DIAGNOSIS — F1210 Cannabis abuse, uncomplicated: Secondary | ICD-10-CM | POA: Diagnosis not present

## 2015-12-10 DIAGNOSIS — I252 Old myocardial infarction: Secondary | ICD-10-CM | POA: Diagnosis not present

## 2015-12-10 DIAGNOSIS — T401X1A Poisoning by heroin, accidental (unintentional), initial encounter: Secondary | ICD-10-CM | POA: Diagnosis not present

## 2016-01-02 ENCOUNTER — Emergency Department (HOSPITAL_BASED_OUTPATIENT_CLINIC_OR_DEPARTMENT_OTHER): Payer: PPO

## 2016-01-02 ENCOUNTER — Encounter (HOSPITAL_BASED_OUTPATIENT_CLINIC_OR_DEPARTMENT_OTHER): Payer: Self-pay

## 2016-01-02 ENCOUNTER — Emergency Department (HOSPITAL_BASED_OUTPATIENT_CLINIC_OR_DEPARTMENT_OTHER)
Admission: EM | Admit: 2016-01-02 | Discharge: 2016-01-02 | Disposition: A | Discharge status: Home / Self Care | Payer: PPO | Attending: Emergency Medicine | Admitting: Emergency Medicine

## 2016-01-02 DIAGNOSIS — M5410 Radiculopathy, site unspecified: Secondary | ICD-10-CM | POA: Diagnosis not present

## 2016-01-02 DIAGNOSIS — M542 Cervicalgia: Secondary | ICD-10-CM | POA: Diagnosis not present

## 2016-01-02 DIAGNOSIS — Y92410 Unspecified street and highway as the place of occurrence of the external cause: Secondary | ICD-10-CM | POA: Insufficient documentation

## 2016-01-02 DIAGNOSIS — I252 Old myocardial infarction: Secondary | ICD-10-CM | POA: Diagnosis not present

## 2016-01-02 DIAGNOSIS — M5441 Lumbago with sciatica, right side: Secondary | ICD-10-CM | POA: Diagnosis not present

## 2016-01-02 DIAGNOSIS — Y999 Unspecified external cause status: Secondary | ICD-10-CM | POA: Diagnosis not present

## 2016-01-02 DIAGNOSIS — F319 Bipolar disorder, unspecified: Secondary | ICD-10-CM | POA: Diagnosis not present

## 2016-01-02 DIAGNOSIS — F17210 Nicotine dependence, cigarettes, uncomplicated: Secondary | ICD-10-CM | POA: Diagnosis not present

## 2016-01-02 DIAGNOSIS — S134XXA Sprain of ligaments of cervical spine, initial encounter: Secondary | ICD-10-CM | POA: Insufficient documentation

## 2016-01-02 DIAGNOSIS — Y9389 Activity, other specified: Secondary | ICD-10-CM | POA: Diagnosis not present

## 2016-01-02 DIAGNOSIS — S199XXA Unspecified injury of neck, initial encounter: Secondary | ICD-10-CM | POA: Diagnosis not present

## 2016-01-02 DIAGNOSIS — M5431 Sciatica, right side: Secondary | ICD-10-CM | POA: Diagnosis not present

## 2016-01-02 DIAGNOSIS — S3992XA Unspecified injury of lower back, initial encounter: Secondary | ICD-10-CM | POA: Diagnosis not present

## 2016-01-02 HISTORY — DX: Malingerer (conscious simulation): Z76.5

## 2016-01-02 HISTORY — DX: Poisoning by heroin, accidental (unintentional), initial encounter: T40.1X1A

## 2016-01-02 HISTORY — DX: Opioid abuse, uncomplicated: F11.10

## 2016-01-02 MED ORDER — ACETAMINOPHEN 500 MG PO TABS
1000.0000 mg | ORAL_TABLET | Freq: Once | ORAL | Status: AC
  Administered 2016-01-02: 1000 mg via ORAL
  Filled 2016-01-02: qty 2

## 2016-01-02 NOTE — ED Notes (Signed)
Pt states he is now having posterior neck pain-advised I would add to back xray

## 2016-01-02 NOTE — Discharge Instructions (Signed)
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
## 2016-01-06 DIAGNOSIS — W228XXA Striking against or struck by other objects, initial encounter: Secondary | ICD-10-CM | POA: Diagnosis not present

## 2016-01-06 DIAGNOSIS — B9561 Methicillin susceptible Staphylococcus aureus infection as the cause of diseases classified elsewhere: Secondary | ICD-10-CM | POA: Diagnosis not present

## 2016-01-06 DIAGNOSIS — M549 Dorsalgia, unspecified: Secondary | ICD-10-CM | POA: Diagnosis not present

## 2016-01-06 DIAGNOSIS — Y998 Other external cause status: Secondary | ICD-10-CM | POA: Diagnosis not present

## 2016-01-06 DIAGNOSIS — I2510 Atherosclerotic heart disease of native coronary artery without angina pectoris: Secondary | ICD-10-CM | POA: Diagnosis not present

## 2016-01-06 DIAGNOSIS — Z888 Allergy status to other drugs, medicaments and biological substances status: Secondary | ICD-10-CM | POA: Diagnosis not present

## 2016-01-06 DIAGNOSIS — G8929 Other chronic pain: Secondary | ICD-10-CM | POA: Diagnosis not present

## 2016-01-06 DIAGNOSIS — M791 Myalgia: Secondary | ICD-10-CM | POA: Diagnosis not present

## 2016-01-06 DIAGNOSIS — M7989 Other specified soft tissue disorders: Secondary | ICD-10-CM | POA: Diagnosis not present

## 2016-01-06 DIAGNOSIS — I10 Essential (primary) hypertension: Secondary | ICD-10-CM | POA: Diagnosis not present

## 2016-01-06 DIAGNOSIS — F17210 Nicotine dependence, cigarettes, uncomplicated: Secondary | ICD-10-CM | POA: Diagnosis not present

## 2016-01-06 DIAGNOSIS — I252 Old myocardial infarction: Secondary | ICD-10-CM | POA: Diagnosis not present

## 2016-01-06 DIAGNOSIS — G40909 Epilepsy, unspecified, not intractable, without status epilepticus: Secondary | ICD-10-CM | POA: Diagnosis not present

## 2016-01-06 DIAGNOSIS — F419 Anxiety disorder, unspecified: Secondary | ICD-10-CM | POA: Diagnosis not present

## 2016-01-06 DIAGNOSIS — Z91040 Latex allergy status: Secondary | ICD-10-CM | POA: Diagnosis not present

## 2016-01-06 DIAGNOSIS — S59911A Unspecified injury of right forearm, initial encounter: Secondary | ICD-10-CM | POA: Diagnosis not present

## 2016-01-06 DIAGNOSIS — Y929 Unspecified place or not applicable: Secondary | ICD-10-CM | POA: Diagnosis not present

## 2016-01-06 DIAGNOSIS — Z886 Allergy status to analgesic agent status: Secondary | ICD-10-CM | POA: Diagnosis not present

## 2016-01-06 DIAGNOSIS — L03113 Cellulitis of right upper limb: Secondary | ICD-10-CM | POA: Diagnosis not present

## 2016-01-06 DIAGNOSIS — S51831A Puncture wound without foreign body of right forearm, initial encounter: Secondary | ICD-10-CM | POA: Diagnosis not present

## 2016-01-06 DIAGNOSIS — Y93H3 Activity, building and construction: Secondary | ICD-10-CM | POA: Diagnosis not present

## 2016-01-06 DIAGNOSIS — F329 Major depressive disorder, single episode, unspecified: Secondary | ICD-10-CM | POA: Diagnosis not present

## 2016-01-06 DIAGNOSIS — L02413 Cutaneous abscess of right upper limb: Secondary | ICD-10-CM | POA: Diagnosis not present

## 2016-01-06 DIAGNOSIS — Z6837 Body mass index (BMI) 37.0-37.9, adult: Secondary | ICD-10-CM | POA: Diagnosis not present

## 2016-01-06 DIAGNOSIS — Z79899 Other long term (current) drug therapy: Secondary | ICD-10-CM | POA: Diagnosis not present

## 2016-01-06 DIAGNOSIS — E669 Obesity, unspecified: Secondary | ICD-10-CM | POA: Diagnosis not present

## 2016-01-07 DIAGNOSIS — L02413 Cutaneous abscess of right upper limb: Secondary | ICD-10-CM | POA: Diagnosis not present

## 2016-01-07 DIAGNOSIS — L0390 Cellulitis, unspecified: Secondary | ICD-10-CM | POA: Diagnosis not present

## 2016-01-07 DIAGNOSIS — L03113 Cellulitis of right upper limb: Secondary | ICD-10-CM | POA: Diagnosis not present

## 2016-01-08 DIAGNOSIS — L02413 Cutaneous abscess of right upper limb: Secondary | ICD-10-CM | POA: Diagnosis not present

## 2016-01-08 DIAGNOSIS — L03113 Cellulitis of right upper limb: Secondary | ICD-10-CM | POA: Diagnosis not present

## 2016-01-08 DIAGNOSIS — L0390 Cellulitis, unspecified: Secondary | ICD-10-CM | POA: Diagnosis not present

## 2016-01-09 DIAGNOSIS — L02413 Cutaneous abscess of right upper limb: Secondary | ICD-10-CM | POA: Diagnosis not present

## 2016-01-09 DIAGNOSIS — L0390 Cellulitis, unspecified: Secondary | ICD-10-CM | POA: Diagnosis not present

## 2016-01-09 DIAGNOSIS — I10 Essential (primary) hypertension: Secondary | ICD-10-CM | POA: Diagnosis not present

## 2016-01-09 DIAGNOSIS — F419 Anxiety disorder, unspecified: Secondary | ICD-10-CM | POA: Diagnosis not present

## 2016-01-09 DIAGNOSIS — L03113 Cellulitis of right upper limb: Secondary | ICD-10-CM | POA: Diagnosis not present

## 2016-01-09 DIAGNOSIS — F4310 Post-traumatic stress disorder, unspecified: Secondary | ICD-10-CM | POA: Diagnosis not present

## 2016-01-10 DIAGNOSIS — A4901 Methicillin susceptible Staphylococcus aureus infection, unspecified site: Secondary | ICD-10-CM | POA: Diagnosis not present

## 2016-01-10 DIAGNOSIS — L03113 Cellulitis of right upper limb: Secondary | ICD-10-CM | POA: Diagnosis not present

## 2016-01-10 DIAGNOSIS — F4310 Post-traumatic stress disorder, unspecified: Secondary | ICD-10-CM | POA: Diagnosis not present

## 2016-01-10 DIAGNOSIS — L02413 Cutaneous abscess of right upper limb: Secondary | ICD-10-CM | POA: Diagnosis not present

## 2016-01-16 DIAGNOSIS — A4901 Methicillin susceptible Staphylococcus aureus infection, unspecified site: Secondary | ICD-10-CM | POA: Diagnosis not present

## 2016-01-16 DIAGNOSIS — L02413 Cutaneous abscess of right upper limb: Secondary | ICD-10-CM | POA: Diagnosis not present

## 2016-01-25 DIAGNOSIS — F17210 Nicotine dependence, cigarettes, uncomplicated: Secondary | ICD-10-CM | POA: Diagnosis not present

## 2016-01-25 DIAGNOSIS — T402X1A Poisoning by other opioids, accidental (unintentional), initial encounter: Secondary | ICD-10-CM | POA: Diagnosis not present

## 2016-01-25 DIAGNOSIS — T50904A Poisoning by unspecified drugs, medicaments and biological substances, undetermined, initial encounter: Secondary | ICD-10-CM | POA: Diagnosis not present

## 2016-01-25 DIAGNOSIS — J449 Chronic obstructive pulmonary disease, unspecified: Secondary | ICD-10-CM | POA: Diagnosis not present

## 2016-02-03 DIAGNOSIS — E785 Hyperlipidemia, unspecified: Secondary | ICD-10-CM | POA: Diagnosis not present

## 2016-02-03 DIAGNOSIS — F419 Anxiety disorder, unspecified: Secondary | ICD-10-CM | POA: Diagnosis not present

## 2016-02-03 DIAGNOSIS — F1410 Cocaine abuse, uncomplicated: Secondary | ICD-10-CM | POA: Diagnosis not present

## 2016-02-03 DIAGNOSIS — F1210 Cannabis abuse, uncomplicated: Secondary | ICD-10-CM | POA: Diagnosis not present

## 2016-02-03 DIAGNOSIS — F1120 Opioid dependence, uncomplicated: Secondary | ICD-10-CM | POA: Diagnosis not present

## 2016-02-03 DIAGNOSIS — F332 Major depressive disorder, recurrent severe without psychotic features: Secondary | ICD-10-CM | POA: Diagnosis not present

## 2016-02-03 DIAGNOSIS — R45851 Suicidal ideations: Secondary | ICD-10-CM | POA: Diagnosis not present

## 2016-02-03 DIAGNOSIS — F17210 Nicotine dependence, cigarettes, uncomplicated: Secondary | ICD-10-CM | POA: Diagnosis not present

## 2016-02-03 DIAGNOSIS — I252 Old myocardial infarction: Secondary | ICD-10-CM | POA: Diagnosis not present

## 2016-02-03 DIAGNOSIS — F4310 Post-traumatic stress disorder, unspecified: Secondary | ICD-10-CM | POA: Diagnosis not present

## 2016-02-03 DIAGNOSIS — F1129 Opioid dependence with unspecified opioid-induced disorder: Secondary | ICD-10-CM | POA: Diagnosis not present

## 2016-02-03 DIAGNOSIS — T1491 Suicide attempt: Secondary | ICD-10-CM | POA: Diagnosis not present

## 2016-02-04 DIAGNOSIS — F1120 Opioid dependence, uncomplicated: Secondary | ICD-10-CM | POA: Diagnosis not present

## 2016-02-04 DIAGNOSIS — F339 Major depressive disorder, recurrent, unspecified: Secondary | ICD-10-CM | POA: Diagnosis not present

## 2016-02-05 DIAGNOSIS — F1120 Opioid dependence, uncomplicated: Secondary | ICD-10-CM | POA: Diagnosis not present

## 2016-02-07 DIAGNOSIS — L03113 Cellulitis of right upper limb: Secondary | ICD-10-CM | POA: Diagnosis not present

## 2016-02-23 DIAGNOSIS — F1120 Opioid dependence, uncomplicated: Secondary | ICD-10-CM | POA: Diagnosis not present

## 2016-02-24 DIAGNOSIS — M545 Low back pain: Secondary | ICD-10-CM | POA: Diagnosis not present

## 2016-02-24 DIAGNOSIS — R112 Nausea with vomiting, unspecified: Secondary | ICD-10-CM | POA: Diagnosis not present

## 2016-02-24 DIAGNOSIS — K645 Perianal venous thrombosis: Secondary | ICD-10-CM | POA: Diagnosis not present

## 2016-02-24 DIAGNOSIS — K760 Fatty (change of) liver, not elsewhere classified: Secondary | ICD-10-CM | POA: Diagnosis not present

## 2016-02-25 DIAGNOSIS — F1410 Cocaine abuse, uncomplicated: Secondary | ICD-10-CM | POA: Diagnosis not present

## 2016-02-25 DIAGNOSIS — R112 Nausea with vomiting, unspecified: Secondary | ICD-10-CM | POA: Diagnosis not present

## 2016-02-25 DIAGNOSIS — F419 Anxiety disorder, unspecified: Secondary | ICD-10-CM | POA: Diagnosis not present

## 2016-02-25 DIAGNOSIS — F332 Major depressive disorder, recurrent severe without psychotic features: Secondary | ICD-10-CM | POA: Diagnosis not present

## 2016-02-25 DIAGNOSIS — Y901 Blood alcohol level of 20-39 mg/100 ml: Secondary | ICD-10-CM | POA: Diagnosis not present

## 2016-02-25 DIAGNOSIS — F1110 Opioid abuse, uncomplicated: Secondary | ICD-10-CM | POA: Diagnosis not present

## 2016-02-25 DIAGNOSIS — F4310 Post-traumatic stress disorder, unspecified: Secondary | ICD-10-CM | POA: Diagnosis not present

## 2016-02-25 DIAGNOSIS — F1020 Alcohol dependence, uncomplicated: Secondary | ICD-10-CM | POA: Diagnosis not present

## 2016-02-25 DIAGNOSIS — Z765 Malingerer [conscious simulation]: Secondary | ICD-10-CM | POA: Diagnosis not present

## 2016-02-25 DIAGNOSIS — K645 Perianal venous thrombosis: Secondary | ICD-10-CM | POA: Diagnosis not present

## 2016-02-25 DIAGNOSIS — F17210 Nicotine dependence, cigarettes, uncomplicated: Secondary | ICD-10-CM | POA: Diagnosis not present

## 2016-02-25 DIAGNOSIS — I252 Old myocardial infarction: Secondary | ICD-10-CM | POA: Diagnosis not present

## 2016-02-25 DIAGNOSIS — F6089 Other specific personality disorders: Secondary | ICD-10-CM | POA: Diagnosis not present

## 2016-02-25 DIAGNOSIS — F1010 Alcohol abuse, uncomplicated: Secondary | ICD-10-CM | POA: Diagnosis not present

## 2016-02-25 DIAGNOSIS — K580 Irritable bowel syndrome with diarrhea: Secondary | ICD-10-CM | POA: Diagnosis not present

## 2016-02-25 DIAGNOSIS — E785 Hyperlipidemia, unspecified: Secondary | ICD-10-CM | POA: Diagnosis not present

## 2016-02-25 DIAGNOSIS — M545 Low back pain: Secondary | ICD-10-CM | POA: Diagnosis not present

## 2016-02-25 DIAGNOSIS — R45851 Suicidal ideations: Secondary | ICD-10-CM | POA: Diagnosis not present

## 2016-02-25 DIAGNOSIS — F1210 Cannabis abuse, uncomplicated: Secondary | ICD-10-CM | POA: Diagnosis not present

## 2016-02-25 DIAGNOSIS — F11229 Opioid dependence with intoxication, unspecified: Secondary | ICD-10-CM | POA: Diagnosis not present

## 2016-02-25 DIAGNOSIS — K760 Fatty (change of) liver, not elsewhere classified: Secondary | ICD-10-CM | POA: Diagnosis not present

## 2016-02-25 DIAGNOSIS — F1129 Opioid dependence with unspecified opioid-induced disorder: Secondary | ICD-10-CM | POA: Diagnosis not present

## 2016-02-25 DIAGNOSIS — F602 Antisocial personality disorder: Secondary | ICD-10-CM | POA: Diagnosis not present

## 2016-03-01 DIAGNOSIS — F1120 Opioid dependence, uncomplicated: Secondary | ICD-10-CM | POA: Diagnosis not present

## 2016-03-08 DIAGNOSIS — F1120 Opioid dependence, uncomplicated: Secondary | ICD-10-CM | POA: Diagnosis not present

## 2016-03-16 DIAGNOSIS — F1120 Opioid dependence, uncomplicated: Secondary | ICD-10-CM | POA: Diagnosis not present

## 2016-03-18 ENCOUNTER — Encounter (HOSPITAL_BASED_OUTPATIENT_CLINIC_OR_DEPARTMENT_OTHER): Payer: Self-pay | Admitting: Emergency Medicine

## 2016-03-18 ENCOUNTER — Emergency Department (HOSPITAL_BASED_OUTPATIENT_CLINIC_OR_DEPARTMENT_OTHER)
Admission: EM | Admit: 2016-03-18 | Discharge: 2016-03-18 | Disposition: A | Discharge status: Home / Self Care | Payer: PPO | Attending: Emergency Medicine | Admitting: Emergency Medicine

## 2016-03-18 DIAGNOSIS — Y999 Unspecified external cause status: Secondary | ICD-10-CM | POA: Diagnosis not present

## 2016-03-18 DIAGNOSIS — W260XXA Contact with knife, initial encounter: Secondary | ICD-10-CM | POA: Insufficient documentation

## 2016-03-18 DIAGNOSIS — Y9389 Activity, other specified: Secondary | ICD-10-CM | POA: Insufficient documentation

## 2016-03-18 DIAGNOSIS — F17210 Nicotine dependence, cigarettes, uncomplicated: Secondary | ICD-10-CM | POA: Diagnosis not present

## 2016-03-18 DIAGNOSIS — S51812A Laceration without foreign body of left forearm, initial encounter: Secondary | ICD-10-CM | POA: Insufficient documentation

## 2016-03-18 DIAGNOSIS — Y929 Unspecified place or not applicable: Secondary | ICD-10-CM | POA: Diagnosis not present

## 2016-03-18 MED ORDER — LIDOCAINE-EPINEPHRINE 2 %-1:100000 IJ SOLN: 10.0000 mL | Freq: Once | INTRAMUSCULAR | Status: DC

## 2016-03-18 MED ORDER — CEPHALEXIN 500 MG PO CAPS: 500.0000 mg | ORAL_CAPSULE | Freq: Four times a day (QID) | ORAL | 0 refills | Status: DC

## 2016-03-18 MED ORDER — CEPHALEXIN 250 MG PO CAPS
1000.0000 mg | ORAL_CAPSULE | Freq: Once | ORAL | Status: AC
  Administered 2016-03-18: 1000 mg via ORAL
  Filled 2016-03-18: qty 4

## 2016-03-18 MED ORDER — LIDOCAINE-EPINEPHRINE (PF) 1 %-1:200000 IJ SOLN
INTRAMUSCULAR | Status: AC
  Filled 2016-03-18: qty 30

## 2016-03-18 MED ORDER — LIDOCAINE-EPINEPHRINE (PF) 2 %-1:200000 IJ SOLN: 20.0000 mL | Freq: Once | INTRAMUSCULAR | Status: DC

## 2016-03-18 MED ORDER — LIDOCAINE-EPINEPHRINE (PF) 1 %-1:200000 IJ SOLN
30.0000 mL | Freq: Once | INTRAMUSCULAR | Status: AC
  Administered 2016-03-18: 30 mL via INTRADERMAL
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
## 2016-03-18 NOTE — ED Triage Notes (Signed)
Pt sustained laceration to left forearm while carving wood.

## 2016-03-19 DIAGNOSIS — F1120 Opioid dependence, uncomplicated: Secondary | ICD-10-CM | POA: Diagnosis not present

## 2016-03-29 DIAGNOSIS — F1120 Opioid dependence, uncomplicated: Secondary | ICD-10-CM | POA: Diagnosis not present

## 2016-04-13 DIAGNOSIS — E785 Hyperlipidemia, unspecified: Secondary | ICD-10-CM | POA: Diagnosis not present

## 2016-04-13 DIAGNOSIS — M79673 Pain in unspecified foot: Secondary | ICD-10-CM | POA: Diagnosis not present

## 2016-04-13 DIAGNOSIS — I10 Essential (primary) hypertension: Secondary | ICD-10-CM | POA: Diagnosis not present

## 2016-04-13 DIAGNOSIS — S21112A Laceration without foreign body of left front wall of thorax without penetration into thoracic cavity, initial encounter: Secondary | ICD-10-CM | POA: Diagnosis not present

## 2016-04-13 DIAGNOSIS — S299XXA Unspecified injury of thorax, initial encounter: Secondary | ICD-10-CM | POA: Diagnosis not present

## 2016-04-13 DIAGNOSIS — F17200 Nicotine dependence, unspecified, uncomplicated: Secondary | ICD-10-CM | POA: Diagnosis not present

## 2016-04-13 DIAGNOSIS — F419 Anxiety disorder, unspecified: Secondary | ICD-10-CM | POA: Diagnosis not present

## 2016-04-13 DIAGNOSIS — Z8249 Family history of ischemic heart disease and other diseases of the circulatory system: Secondary | ICD-10-CM | POA: Diagnosis not present

## 2016-04-13 DIAGNOSIS — R030 Elevated blood-pressure reading, without diagnosis of hypertension: Secondary | ICD-10-CM | POA: Diagnosis not present

## 2016-04-13 DIAGNOSIS — G8929 Other chronic pain: Secondary | ICD-10-CM | POA: Diagnosis not present

## 2016-04-13 DIAGNOSIS — I252 Old myocardial infarction: Secondary | ICD-10-CM | POA: Diagnosis not present

## 2016-04-13 DIAGNOSIS — M549 Dorsalgia, unspecified: Secondary | ICD-10-CM | POA: Diagnosis not present

## 2016-04-13 DIAGNOSIS — F1410 Cocaine abuse, uncomplicated: Secondary | ICD-10-CM | POA: Diagnosis not present

## 2016-04-13 DIAGNOSIS — Z888 Allergy status to other drugs, medicaments and biological substances status: Secondary | ICD-10-CM | POA: Diagnosis not present

## 2016-04-13 DIAGNOSIS — F1110 Opioid abuse, uncomplicated: Secondary | ICD-10-CM | POA: Diagnosis not present

## 2016-04-13 DIAGNOSIS — S21139A Puncture wound without foreign body of unspecified front wall of thorax without penetration into thoracic cavity, initial encounter: Secondary | ICD-10-CM | POA: Diagnosis not present

## 2016-04-13 DIAGNOSIS — M79606 Pain in leg, unspecified: Secondary | ICD-10-CM | POA: Diagnosis not present

## 2016-04-13 DIAGNOSIS — F319 Bipolar disorder, unspecified: Secondary | ICD-10-CM | POA: Diagnosis not present

## 2016-04-13 DIAGNOSIS — F1210 Cannabis abuse, uncomplicated: Secondary | ICD-10-CM | POA: Diagnosis not present

## 2016-04-14 DIAGNOSIS — F1120 Opioid dependence, uncomplicated: Secondary | ICD-10-CM | POA: Diagnosis not present

## 2016-04-23 DIAGNOSIS — F1120 Opioid dependence, uncomplicated: Secondary | ICD-10-CM | POA: Diagnosis not present

## 2016-05-12 DIAGNOSIS — F1120 Opioid dependence, uncomplicated: Secondary | ICD-10-CM | POA: Diagnosis not present

## 2016-05-20 DIAGNOSIS — F1120 Opioid dependence, uncomplicated: Secondary | ICD-10-CM | POA: Diagnosis not present

## 2016-05-28 DIAGNOSIS — F1120 Opioid dependence, uncomplicated: Secondary | ICD-10-CM | POA: Diagnosis not present

## 2016-05-31 DIAGNOSIS — F1120 Opioid dependence, uncomplicated: Secondary | ICD-10-CM | POA: Diagnosis not present

## 2016-06-03 DIAGNOSIS — F1020 Alcohol dependence, uncomplicated: Secondary | ICD-10-CM | POA: Diagnosis not present

## 2016-06-03 DIAGNOSIS — L02512 Cutaneous abscess of left hand: Secondary | ICD-10-CM | POA: Diagnosis not present

## 2016-06-03 DIAGNOSIS — Z79899 Other long term (current) drug therapy: Secondary | ICD-10-CM | POA: Diagnosis not present

## 2016-06-03 DIAGNOSIS — W11XXXA Fall on and from ladder, initial encounter: Secondary | ICD-10-CM | POA: Diagnosis not present

## 2016-06-03 DIAGNOSIS — S60512A Abrasion of left hand, initial encounter: Secondary | ICD-10-CM | POA: Diagnosis not present

## 2016-06-17 DIAGNOSIS — F1120 Opioid dependence, uncomplicated: Secondary | ICD-10-CM | POA: Diagnosis not present

## 2016-06-22 DIAGNOSIS — F1120 Opioid dependence, uncomplicated: Secondary | ICD-10-CM | POA: Diagnosis not present

## 2016-06-27 IMAGING — CR DG LUMBAR SPINE COMPLETE 4+V
5 series · 5 of 5 positions shown · non-contrast
Comparison: MRI of 11/15/2013

CLINICAL DATA: Back pain after a fall.

EXAM:
LUMBAR SPINE - COMPLETE 4+ VIEW

[t l-spine a.p.]
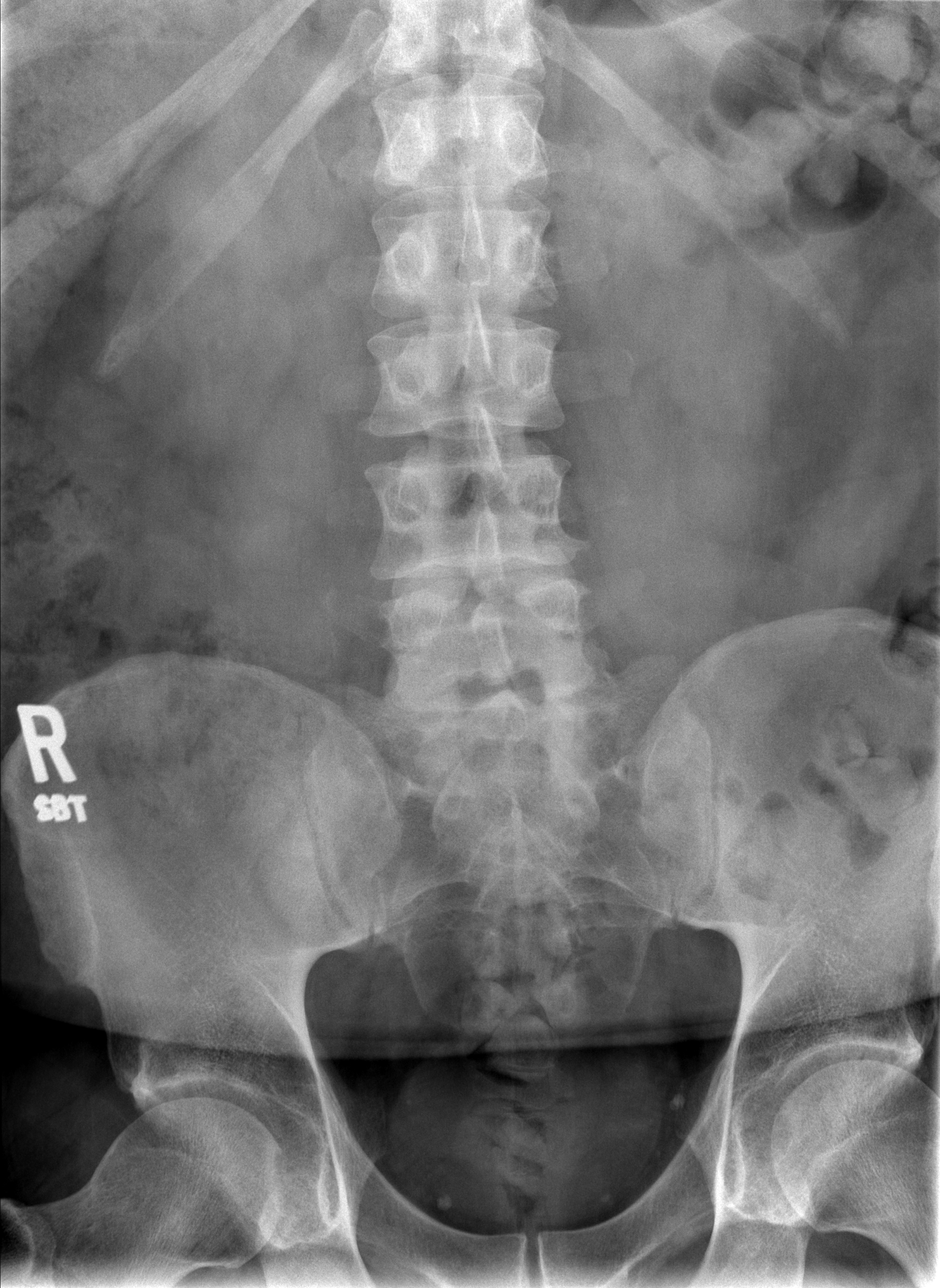

[t l-spine oblique exposure (1 of 2)]
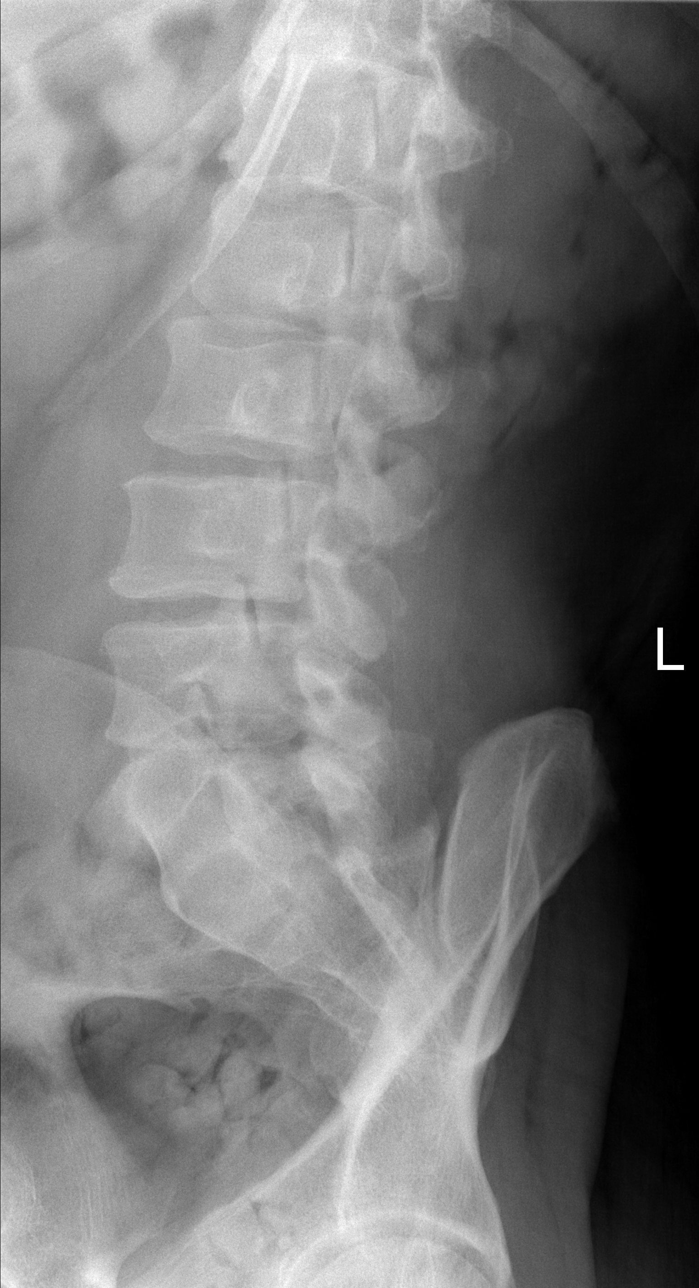

[t l-spine oblique exposure (2 of 2)]
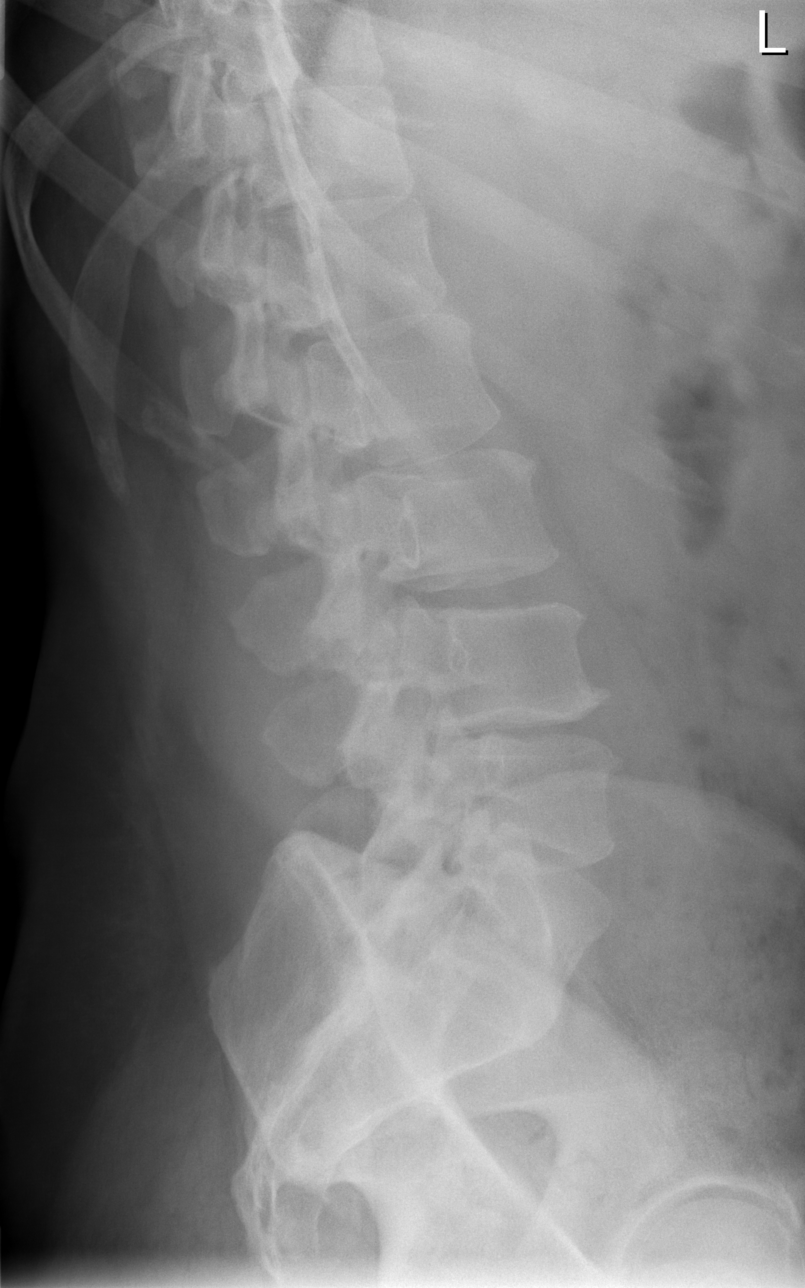

[t l-spine lat]
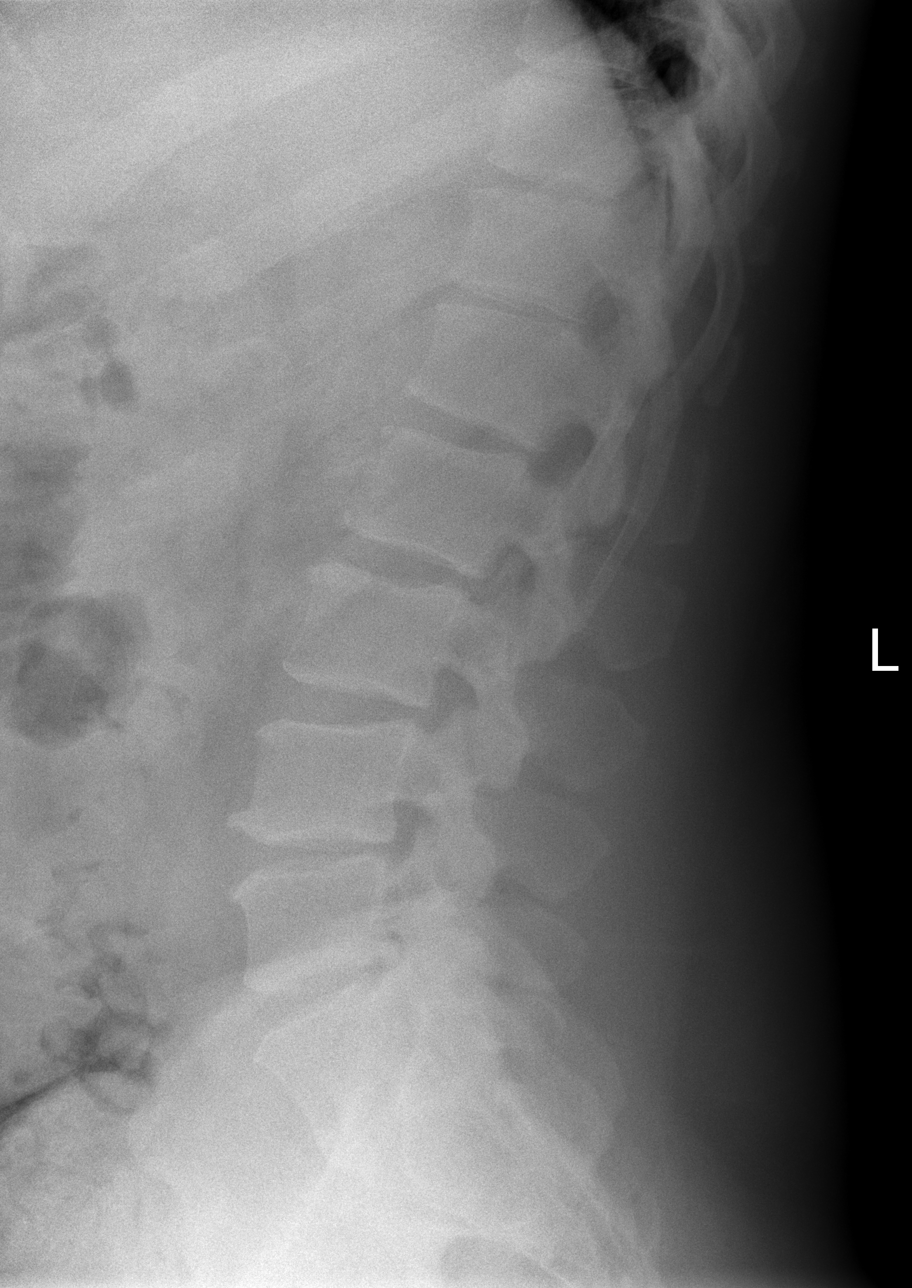

[t l-spine l5-s1 spot]
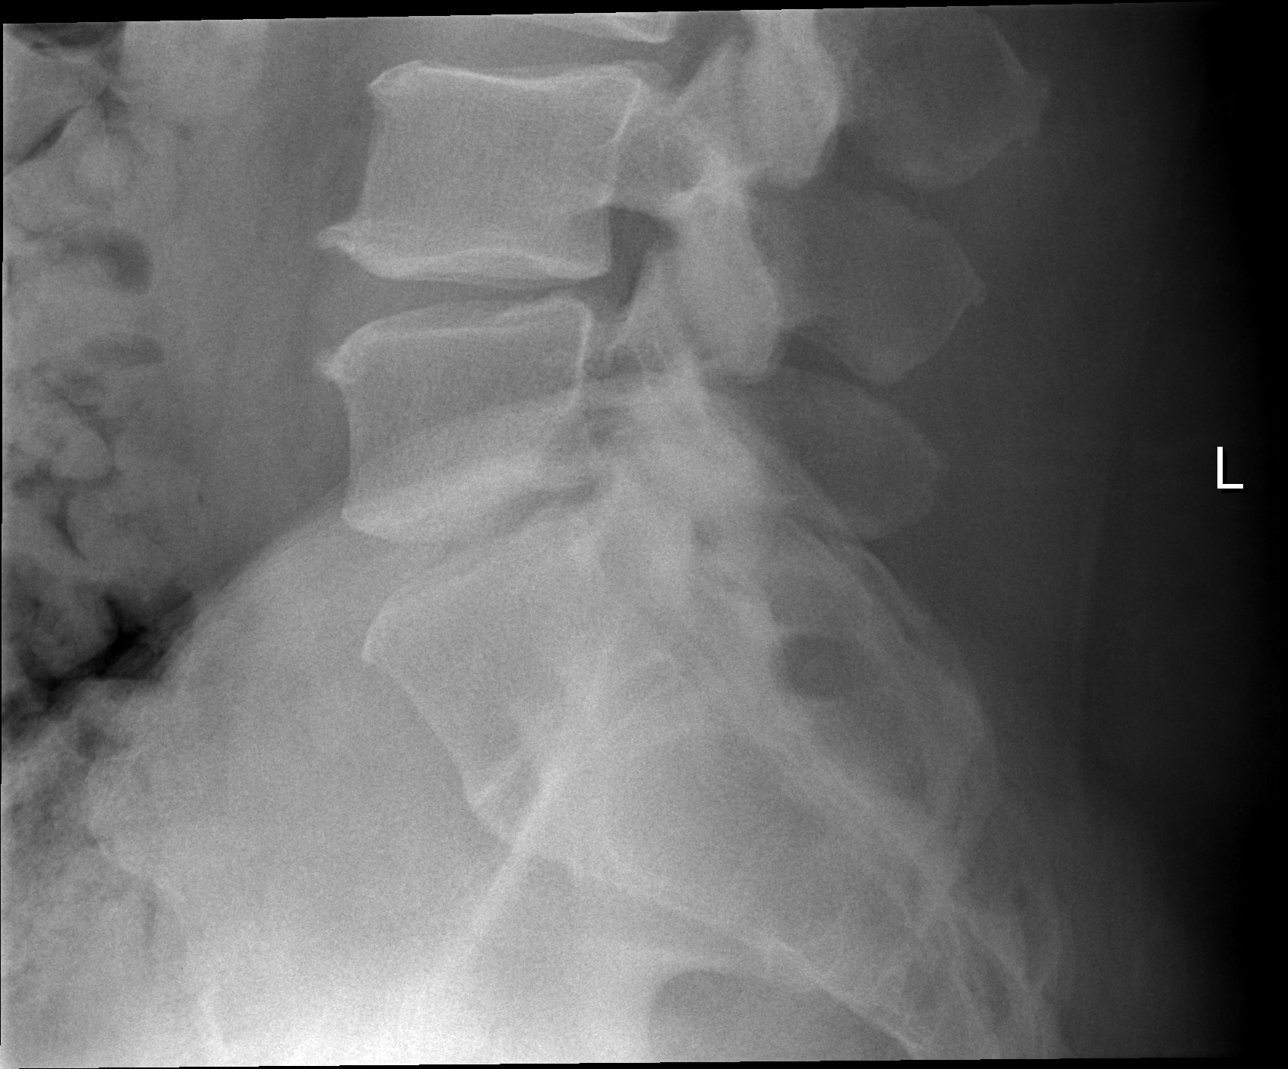

[5 of 5 positions shown; findings below may reference images not displayed]

FINDINGS: Transitional anatomy as was detailed on the 11/15/2013 MRI.
Sacroiliac joints are symmetric. Maintenance of vertebral body
height. Trace L5-S1 retrolisthesis is similar to on the prior exam.
Degenerative disc disease at this level is moderate. Other
intervertebral disc height is maintained.
IMPRESSION: Spondylosis and trace L5-S1 retrolisthesis, as before. No acute
superimposed process.

## 2016-12-11 DIAGNOSIS — R197 Diarrhea, unspecified: Secondary | ICD-10-CM | POA: Diagnosis not present

## 2016-12-11 DIAGNOSIS — K089 Disorder of teeth and supporting structures, unspecified: Secondary | ICD-10-CM | POA: Diagnosis not present

## 2016-12-11 DIAGNOSIS — G8929 Other chronic pain: Secondary | ICD-10-CM | POA: Diagnosis not present

## 2016-12-11 DIAGNOSIS — K029 Dental caries, unspecified: Secondary | ICD-10-CM | POA: Diagnosis not present

## 2017-02-04 DIAGNOSIS — R600 Localized edema: Secondary | ICD-10-CM | POA: Diagnosis not present

## 2017-02-04 DIAGNOSIS — I252 Old myocardial infarction: Secondary | ICD-10-CM | POA: Diagnosis not present

## 2017-02-04 DIAGNOSIS — R61 Generalized hyperhidrosis: Secondary | ICD-10-CM | POA: Diagnosis not present

## 2017-02-04 DIAGNOSIS — G8929 Other chronic pain: Secondary | ICD-10-CM | POA: Diagnosis not present

## 2017-02-04 DIAGNOSIS — F17210 Nicotine dependence, cigarettes, uncomplicated: Secondary | ICD-10-CM | POA: Diagnosis not present

## 2017-02-04 DIAGNOSIS — J449 Chronic obstructive pulmonary disease, unspecified: Secondary | ICD-10-CM | POA: Diagnosis not present

## 2017-02-04 DIAGNOSIS — R0789 Other chest pain: Secondary | ICD-10-CM | POA: Diagnosis not present

## 2017-02-04 DIAGNOSIS — M79602 Pain in left arm: Secondary | ICD-10-CM | POA: Diagnosis not present

## 2017-02-04 DIAGNOSIS — M7989 Other specified soft tissue disorders: Secondary | ICD-10-CM | POA: Diagnosis not present

## 2017-02-04 DIAGNOSIS — R079 Chest pain, unspecified: Secondary | ICD-10-CM | POA: Diagnosis not present

## 2017-02-04 DIAGNOSIS — R110 Nausea: Secondary | ICD-10-CM | POA: Diagnosis not present

## 2017-02-04 DIAGNOSIS — M542 Cervicalgia: Secondary | ICD-10-CM | POA: Diagnosis not present

## 2017-02-04 DIAGNOSIS — R0602 Shortness of breath: Secondary | ICD-10-CM | POA: Diagnosis not present

## 2017-02-04 DIAGNOSIS — E785 Hyperlipidemia, unspecified: Secondary | ICD-10-CM | POA: Diagnosis not present

## 2017-03-10 DIAGNOSIS — I10 Essential (primary) hypertension: Secondary | ICD-10-CM | POA: Diagnosis not present

## 2017-03-10 DIAGNOSIS — Z888 Allergy status to other drugs, medicaments and biological substances status: Secondary | ICD-10-CM | POA: Diagnosis not present

## 2017-03-10 DIAGNOSIS — G8929 Other chronic pain: Secondary | ICD-10-CM | POA: Diagnosis not present

## 2017-03-10 DIAGNOSIS — M549 Dorsalgia, unspecified: Secondary | ICD-10-CM | POA: Diagnosis not present

## 2017-03-10 DIAGNOSIS — J449 Chronic obstructive pulmonary disease, unspecified: Secondary | ICD-10-CM | POA: Diagnosis not present

## 2017-03-10 DIAGNOSIS — R945 Abnormal results of liver function studies: Secondary | ICD-10-CM | POA: Diagnosis not present

## 2017-03-10 DIAGNOSIS — I252 Old myocardial infarction: Secondary | ICD-10-CM | POA: Diagnosis not present

## 2017-03-10 DIAGNOSIS — F419 Anxiety disorder, unspecified: Secondary | ICD-10-CM | POA: Diagnosis not present

## 2017-03-10 DIAGNOSIS — F319 Bipolar disorder, unspecified: Secondary | ICD-10-CM | POA: Diagnosis not present

## 2017-03-10 DIAGNOSIS — Z79899 Other long term (current) drug therapy: Secondary | ICD-10-CM | POA: Diagnosis not present

## 2017-03-10 DIAGNOSIS — F17210 Nicotine dependence, cigarettes, uncomplicated: Secondary | ICD-10-CM | POA: Diagnosis not present

## 2017-03-10 DIAGNOSIS — E876 Hypokalemia: Secondary | ICD-10-CM | POA: Diagnosis not present

## 2017-03-10 DIAGNOSIS — R079 Chest pain, unspecified: Secondary | ICD-10-CM | POA: Diagnosis not present

## 2017-05-20 DIAGNOSIS — F1120 Opioid dependence, uncomplicated: Secondary | ICD-10-CM | POA: Diagnosis not present

## 2017-05-20 DIAGNOSIS — F1410 Cocaine abuse, uncomplicated: Secondary | ICD-10-CM | POA: Diagnosis not present

## 2017-05-20 DIAGNOSIS — F1010 Alcohol abuse, uncomplicated: Secondary | ICD-10-CM | POA: Diagnosis not present

## 2017-05-20 DIAGNOSIS — J449 Chronic obstructive pulmonary disease, unspecified: Secondary | ICD-10-CM | POA: Diagnosis not present

## 2017-05-20 DIAGNOSIS — I10 Essential (primary) hypertension: Secondary | ICD-10-CM | POA: Diagnosis not present

## 2017-05-30 DIAGNOSIS — Z7289 Other problems related to lifestyle: Secondary | ICD-10-CM | POA: Diagnosis not present

## 2017-05-30 DIAGNOSIS — F1190 Opioid use, unspecified, uncomplicated: Secondary | ICD-10-CM | POA: Diagnosis not present

## 2017-05-30 DIAGNOSIS — Z5320 Procedure and treatment not carried out because of patient's decision for unspecified reasons: Secondary | ICD-10-CM | POA: Diagnosis not present

## 2017-05-30 DIAGNOSIS — M5441 Lumbago with sciatica, right side: Secondary | ICD-10-CM | POA: Diagnosis not present

## 2017-05-30 DIAGNOSIS — M5442 Lumbago with sciatica, left side: Secondary | ICD-10-CM | POA: Diagnosis not present

## 2017-06-06 ENCOUNTER — Emergency Department (HOSPITAL_COMMUNITY)
Admission: EM | Admit: 2017-06-06 | Discharge: 2017-06-07 | Disposition: A | Discharge status: Other Acute Inpatient Hospital | Payer: PPO | Attending: Emergency Medicine | Admitting: Emergency Medicine

## 2017-06-06 ENCOUNTER — Encounter (HOSPITAL_COMMUNITY): Payer: Self-pay | Admitting: Emergency Medicine

## 2017-06-06 DIAGNOSIS — Z046 Encounter for general psychiatric examination, requested by authority: Secondary | ICD-10-CM | POA: Diagnosis not present

## 2017-06-06 DIAGNOSIS — F17210 Nicotine dependence, cigarettes, uncomplicated: Secondary | ICD-10-CM | POA: Insufficient documentation

## 2017-06-06 DIAGNOSIS — F32A Depression, unspecified: Secondary | ICD-10-CM

## 2017-06-06 DIAGNOSIS — R45851 Suicidal ideations: Secondary | ICD-10-CM | POA: Diagnosis not present

## 2017-06-06 DIAGNOSIS — R740 Nonspecific elevation of levels of transaminase and lactic acid dehydrogenase [LDH]: Secondary | ICD-10-CM | POA: Diagnosis not present

## 2017-06-06 DIAGNOSIS — R451 Restlessness and agitation: Secondary | ICD-10-CM | POA: Diagnosis not present

## 2017-06-06 DIAGNOSIS — Z79899 Other long term (current) drug therapy: Secondary | ICD-10-CM | POA: Insufficient documentation

## 2017-06-06 DIAGNOSIS — Z91040 Latex allergy status: Secondary | ICD-10-CM | POA: Diagnosis not present

## 2017-06-06 DIAGNOSIS — F329 Major depressive disorder, single episode, unspecified: Secondary | ICD-10-CM | POA: Diagnosis not present

## 2017-06-06 DIAGNOSIS — R7401 Elevation of levels of liver transaminase levels: Secondary | ICD-10-CM

## 2017-06-06 LAB — CBC
HCT: 43.1 % (ref 39.0–52.0)
Hemoglobin: 15.2 g/dL (ref 13.0–17.0)
MCH: 30.8 pg (ref 26.0–34.0)
MCHC: 35.3 g/dL (ref 30.0–36.0)
MCV: 87.2 fL (ref 78.0–100.0)
Platelets: 181 10*3/uL (ref 150–400)
RBC: 4.94 MIL/uL (ref 4.22–5.81)
RDW: 13.1 % (ref 11.5–15.5)
WBC: 5.8 10*3/uL (ref 4.0–10.5)

## 2017-06-06 LAB — COMPREHENSIVE METABOLIC PANEL
ALT: 413 U/L — ABNORMAL HIGH (ref 17–63)
AST: 255 U/L — ABNORMAL HIGH (ref 15–41)
Albumin: 3.6 g/dL (ref 3.5–5.0)
Alkaline Phosphatase: 100 U/L (ref 38–126)
Anion gap: 8 (ref 5–15)
BUN: 10 mg/dL (ref 6–20)
CO2: 28 mmol/L (ref 22–32)
Calcium: 8.7 mg/dL — ABNORMAL LOW (ref 8.9–10.3)
Chloride: 104 mmol/L (ref 101–111)
Creatinine, Ser: 0.79 mg/dL (ref 0.61–1.24)
GFR calc Af Amer: >60 mL/min (ref >60)
GFR calc non Af Amer: >60 mL/min (ref >60)
Glucose, Bld: 138 mg/dL — ABNORMAL HIGH (ref 65–99)
Potassium: 3.6 mmol/L (ref 3.5–5.1)
Sodium: 140 mmol/L (ref 135–145)
Total Bilirubin: 1 mg/dL (ref 0.3–1.2)
Total Protein: 6.4 g/dL — ABNORMAL LOW (ref 6.5–8.1)

## 2017-06-06 LAB — ETHANOL: Alcohol, Ethyl (B): <10 mg/dL (ref <10)

## 2017-06-06 LAB — RAPID URINE DRUG SCREEN, HOSP PERFORMED
Amphetamines: NOT DETECTED
Barbiturates: POSITIVE — AB
Benzodiazepines: NOT DETECTED
Cocaine: POSITIVE — AB
Opiates: POSITIVE — AB
Tetrahydrocannabinol: POSITIVE — AB

## 2017-06-06 LAB — ACETAMINOPHEN LEVEL: Acetaminophen (Tylenol), Serum: <10 ug/mL — ABNORMAL LOW (ref 10–30)

## 2017-06-06 LAB — SALICYLATE LEVEL: Salicylate Lvl: <7 mg/dL (ref 2.8–30.0)

## 2017-06-06 MED ORDER — VITAMIN B-1 100 MG PO TABS
100.0000 mg | ORAL_TABLET | Freq: Every day | ORAL | Status: DC
  Administered 2017-06-06: 100 mg via ORAL
  Filled 2017-06-06: qty 1

## 2017-06-06 MED ORDER — CITALOPRAM HYDROBROMIDE 10 MG PO TABS: 20.0000 mg | ORAL_TABLET | ORAL | Status: DC

## 2017-06-06 MED ORDER — LORAZEPAM 1 MG PO TABS: 0.0000 mg | ORAL_TABLET | Freq: Two times a day (BID) | ORAL | Status: DC

## 2017-06-06 MED ORDER — LORAZEPAM 2 MG/ML IJ SOLN: 0.0000 mg | Freq: Two times a day (BID) | INTRAMUSCULAR | Status: DC

## 2017-06-06 MED ORDER — NICOTINE 21 MG/24HR TD PT24
21.0000 mg | MEDICATED_PATCH | Freq: Every day | TRANSDERMAL | Status: DC
  Administered 2017-06-06: 21 mg via TRANSDERMAL
  Filled 2017-06-06: qty 1

## 2017-06-06 MED ORDER — LORAZEPAM 2 MG/ML IJ SOLN
0.0000 mg | Freq: Four times a day (QID) | INTRAMUSCULAR | Status: DC
Start: 2017-06-06 — End: 2017-06-07

## 2017-06-06 MED ORDER — THIAMINE HCL 100 MG/ML IJ SOLN: 100.0000 mg | Freq: Every day | INTRAMUSCULAR | Status: DC

## 2017-06-06 MED ORDER — GABAPENTIN 400 MG PO CAPS
800.0000 mg | ORAL_CAPSULE | Freq: Three times a day (TID) | ORAL | Status: DC
  Administered 2017-06-06: 800 mg via ORAL
  Filled 2017-06-06: qty 2

## 2017-06-06 MED ORDER — LORAZEPAM 1 MG PO TABS
0.0000 mg | ORAL_TABLET | Freq: Four times a day (QID) | ORAL | Status: DC
  Administered 2017-06-06: 2 mg via ORAL
  Filled 2017-06-06: qty 2

## 2017-06-06 MED ORDER — ONDANSETRON HCL 4 MG PO TABS: 4.0000 mg | ORAL_TABLET | Freq: Three times a day (TID) | ORAL | Status: DC | PRN

## 2017-06-06 MED ORDER — PRAZOSIN HCL 2 MG PO CAPS
2.0000 mg | ORAL_CAPSULE | Freq: Every day | ORAL | Status: DC
  Administered 2017-06-06: 2 mg via ORAL
  Filled 2017-06-06: qty 1

## 2017-06-06 MED ORDER — BUSPIRONE HCL 10 MG PO TABS
15.0000 mg | ORAL_TABLET | Freq: Three times a day (TID) | ORAL | Status: DC
Start: 2017-06-07 — End: 2017-06-07

## 2017-06-06 MED ORDER — QUETIAPINE FUMARATE 300 MG PO TABS: 300.0000 mg | ORAL_TABLET | Freq: Every day | ORAL | Status: DC

## 2017-06-06 MED ORDER — ACETAMINOPHEN 325 MG PO TABS
650.0000 mg | ORAL_TABLET | ORAL | Status: DC | PRN
  Administered 2017-06-06: 650 mg via ORAL
  Filled 2017-06-06: qty 2
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
## 2017-06-06 NOTE — ED Provider Notes (Signed)
[place] EMERGENCY DEPARTMENT Provider Note   CSN: [id number] Arrival date & time: 06/06/17  1649     History   Chief Complaint Chief Complaint  Patient presents with  . Suicidal    HPI Scott [name] is a 46 y.o. male.  46 year old male with history of seizures, bipolar 1 disorder, anxiety and depression, reflux, PTSD, and chronic pain presents to the emergency department for worsening depression and suicidal ideations.  He states that his wife walked in when he was about to inject IV fentanyl with intent to overdose.  He notes a history of drug addiction and alcohol abuse.  He recently relapsed 3 months ago after 1 year of sobriety.  Patient last used heroin 6 hours ago.  He has been drinking approximately 1/5 of liquor daily.  Last alcohol ingestion 8 hours ago.  He denies any seizures from alcohol withdrawal, but is feeling slightly agitated.  He denies any homicidal ideations.  Patient with chronic pain, but denies any new or worsening pain from baseline.  No nausea or vomiting.      Past Medical History:  Diagnosis Date  . Accidental heroin overdose ([place])   . Anxiety   . Back pain   . Bipolar 1 disorder ([place])   . Chronic back pain   . Depression   . Drug-seeking behavior   . GERD (gastroesophageal reflux disease)   . Heroin abuse ([place])   . History of ETOH abuse   . Myocardial infarction ([place])   . Opioid dependence ([place])   . PTSD (post-traumatic stress disorder)   . Seizures [facility])     Patient Active Problem List   Diagnosis Date Noted  . Alcohol use disorder, severe, dependence ([place]) 07/24/2015  . Alcohol withdrawal ([place]) 07/24/2015  . Cocaine use disorder, mild, abuse ([place]) 07/24/2015  . Substance induced mood disorder ([place]) 07/22/2015  . Drug-seeking behavior 02/16/2015  . PTSD (post-traumatic stress disorder) 12/26/2014  . Opioid type dependence, continuous ([place]) 12/26/2014  . Major depressive disorder, recurrent episode, moderate ([place])    . Chest pain at rest 10/22/2013  . Chronic pain syndrome 10/22/2013  . Seizure disorder ([place]) 10/22/2013  . Chest pain 10/21/2013    Past Surgical History:  Procedure Laterality Date  . ABDOMINAL SURGERY    . APPENDECTOMY    . BACK SURGERY         Home Medications    Prior to Admission medications   Medication Sig Start Date End Date Taking? Authorizing Provider  busPIRone (BUSPAR) 7.5 MG tablet Take 1 tablet (7.5 mg total) by mouth 3 (three) times daily. Patient taking differently: Take 15 mg by mouth 3 (three) times daily.  01/02/15  Yes Niel Hummer, NP  citalopram (CELEXA) 10 MG tablet Take 3 tablets (30 mg total) by mouth every morning. Patient taking differently: Take 20 mg by mouth every morning.  07/26/15  Yes Withrow, Elyse Jarvis, FNP  gabapentin (NEURONTIN) 400 MG capsule Take 2 capsules (800 mg total) by mouth 3 (three) times daily. 07/26/15  Yes Withrow, Elyse Jarvis, FNP  loperamide (IMODIUM A-D) 2 MG tablet Take 2 tablets (4 mg total) by mouth 4 (four) times daily as needed for diarrhea or loose stools. 02/23/15  Yes Carrie Mew, MD  LORazepam (ATIVAN) 1 MG tablet Take 1 tablet (1 mg total) by mouth 3 (three) times daily. Patient taking differently: Take 1 mg by mouth 3 (three) times daily as needed for anxiety.  07/26/15  Yes Withrow, Elyse Jarvis, FNP  Multiple Vitamin (MULTIVITAMIN WITH MINERALS) TABS tablet Take 1 tablet by mouth daily. 07/24/15  Yes Derrill Center, NP  Naloxone HCl 0.4 MG/0.4ML SOAJ Inject 1 application as directed once. Dispense 1 naloxone autoinjector kit Patient taking differently: Inject 1 application as directed daily as needed (overdose). Dispense 1 naloxone autoinjector kit 02/25/15  Yes Delman Kitten, MD  nicotine (NICODERM CQ - DOSED IN MG/24 HOURS) 21 mg/24hr patch Place 1 patch (21 mg total) onto the skin daily. Patient taking differently: Place 21 mg onto the skin daily as needed (nicotine dependence).  07/26/15  Yes Withrow, Elyse Jarvis, FNP    ondansetron (ZOFRAN ODT) 8 MG disintegrating tablet Take 1 tablet (8 mg total) by mouth every 8 (eight) hours as needed for nausea or vomiting. 02/23/15  Yes Carrie Mew, MD  potassium chloride (K-DUR,KLOR-CON) 10 MEQ tablet Take 1 tablet (10 mEq total) by mouth daily. To prevent low potassium from diuretic usage. Patient taking differently: Take 10 mEq by mouth daily.  01/02/15  Yes Niel Hummer, NP  prazosin (MINIPRESS) 2 MG capsule Take 2 mg by mouth at bedtime.   Yes [provider]  QUEtiapine (SEROQUEL) 300 MG tablet Take 300 mg by mouth at bedtime.   Yes [provider]    Family History Family History  Problem Relation Age of Onset  . Hypertension Mother   . CAD Father   . COPD Father   . Stroke Father   . Testicular cancer Brother     Social History Social History  Substance Use Topics  . Smoking status: Current Every Day Smoker    Packs/day: 1.50    Types: Cigarettes  . Smokeless tobacco: Never Used  . Alcohol use Yes     Comment: a fifth and a half of whiskey per day     Allergies   Benadryl [diphenhydramine hcl]; Celecoxib; Diphenhydramine; Prednisone; Risperidone and related; Nsaids; Other; Rofecoxib; Trazodone and nefazodone; Esomeprazole; Ibuprofen; Latex; and Paliperidone palmitate   Review of Systems Review of Systems Ten systems reviewed and are negative for acute change, except as noted in the HPI.    Physical Exam Updated Vital Signs BP 116/74   Pulse 86   Temp 97.7 F (36.5 C) (Oral)   Resp 16   Ht 6' (1.829 m)   Wt 116.1 kg (256 lb)   SpO2 99%   BMI 34.72 kg/m   Physical Exam  Constitutional: He is oriented to person, place, and time. He appears well-developed and well-nourished. No distress.  Nontoxic appearing and in NAD  HENT:  Head: Normocephalic and atraumatic.  Eyes: Conjunctivae and EOM are normal. No scleral icterus.  Neck: Normal range of motion.  Pulmonary/Chest: Effort normal. No respiratory distress.   Respirations even and unlabored  Musculoskeletal: Normal range of motion.  Neurological: He is alert and oriented to person, place, and time. He exhibits normal muscle tone. Coordination normal.  Skin: Skin is warm and dry. No rash noted. He is not diaphoretic. No erythema. No pallor.  Psychiatric: His speech is normal and behavior is normal. He exhibits a depressed mood. He expresses suicidal ideation. He expresses no homicidal ideation. He expresses suicidal plans. He expresses no homicidal plans.  Nursing note and vitals reviewed.    ED Treatments / Results  Labs (all labs ordered are listed, but only abnormal results are displayed) Labs Reviewed  COMPREHENSIVE METABOLIC PANEL - Abnormal; Notable for the following:       Result Value   Glucose, Bld 138 (*)  Calcium 8.7 (*)    Total Protein 6.4 (*)    AST 255 (*)    ALT 413 (*)    All other components within normal limits  ACETAMINOPHEN LEVEL - Abnormal; Notable for the following:    Acetaminophen (Tylenol), Serum <10 (*)    All other components within normal limits  RAPID URINE DRUG SCREEN, HOSP PERFORMED - Abnormal; Notable for the following:    Opiates POSITIVE (*)    Cocaine POSITIVE (*)    Tetrahydrocannabinol POSITIVE (*)    Barbiturates POSITIVE (*)    All other components within normal limits  ETHANOL  SALICYLATE LEVEL  CBC    EKG  EKG Interpretation None       Radiology No results found.  Procedures Procedures (including critical care time)  Medications Ordered in ED Medications - No data to display   Initial Impression / Assessment and Plan / ED Course  I have reviewed the triage vital signs and the nursing notes.  Pertinent labs & imaging results that were available during my care of the patient were reviewed by me and considered in my medical decision making (see chart for details).     46 year old male presents for worsening depression and suicidal ideation.  History of alcohol and opiate  abuse.  Transaminitis suspected secondary to use of illicit substances and drinking 1/5 of liquor daily.  The patient has been medically cleared and evaluated by TTS who have accepted the patient to behavioral health for inpatient psychiatric management.  Accepting MD, Dr. Parke Poisson.  Vitals:   06/06/17 1703 06/06/17 2243 06/06/17 2344 06/07/17 0002  BP: 139/83 139/83 118/60 116/74  Pulse: 63 63 65 86  Resp: _0 Temp: 97.8 F (36.6 C)  97.7 F (36.5 C)   TempSrc: Oral  Oral   SpO2: 96%  95% 99%  Weight: 116.1 kg (256 lb)     Height: 6' (1.829 m)       Final Clinical Impressions(s) / ED Diagnoses   Final diagnoses:  Suicidal ideation  Depression, unspecified depression type  Transaminitis    New Prescriptions Discharge Medication List as of 06/07/2017 12:49 AM       Antonietta Breach, PA-C 06/07/17 0328    Fatima Blank, MD 06/07/17 2351

## 2017-06-06 NOTE — ED Triage Notes (Signed)
Pt to ER with complaint of suicidal ideations with plan to overdose on heroin. States has hx of deep depression, and reports 2-3 weeks of feeling hopeless and feeling suicidal. States today was trying to overdose when wife found him and stopped him. VSS. Patient calm and cooperative. VSS at this time. States ETOH and heroin abuse, 8 hours ago was last ETOH and 6 hours ago for heroin use.

## 2017-06-06 NOTE — BHH Counselor (Addendum)
Per Donell SievertSpencer Simon, PA-C: Patient meets criteria for inpatient treatment.  Per [facility], Fransico MichaelKim Brooks, [facility] and can come at any time.   Accepting Provider: Donell SievertSpencer Simon, PA-C Attending Provider: Jama Flavorsobos, MD Nursing Report: [phone] Room Nursing Report: [phone]  [facility] Attending Provider, Antony MaduraKelly Humes, PA-C and KerbyBobby notified at [phone].

## 2017-06-06 NOTE — ED Notes (Signed)
Cords were removed from the room.

## 2017-06-06 NOTE — ED Notes (Signed)
Pt on TTS at this time.  
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
## 2017-06-06 NOTE — ED Notes (Signed)
ED Provider at bedside. 

## 2017-06-07 ENCOUNTER — Inpatient Hospital Stay (HOSPITAL_COMMUNITY)
Admission: AD | Admit: 2017-06-07 | Discharge: 2017-06-11 | DRG: 897 | Disposition: A | Discharge status: Home / Self Care | Payer: PPO | Source: Intra-hospital | Attending: Psychiatry | Admitting: Psychiatry

## 2017-06-07 ENCOUNTER — Encounter (HOSPITAL_COMMUNITY): Payer: Self-pay | Admitting: Emergency Medicine

## 2017-06-07 DIAGNOSIS — Z888 Allergy status to other drugs, medicaments and biological substances status: Secondary | ICD-10-CM

## 2017-06-07 DIAGNOSIS — F1120 Opioid dependence, uncomplicated: Secondary | ICD-10-CM | POA: Diagnosis present

## 2017-06-07 DIAGNOSIS — F419 Anxiety disorder, unspecified: Secondary | ICD-10-CM | POA: Diagnosis not present

## 2017-06-07 DIAGNOSIS — F39 Unspecified mood [affective] disorder: Secondary | ICD-10-CM | POA: Diagnosis not present

## 2017-06-07 DIAGNOSIS — G4700 Insomnia, unspecified: Secondary | ICD-10-CM | POA: Diagnosis not present

## 2017-06-07 DIAGNOSIS — Z818 Family history of other mental and behavioral disorders: Secondary | ICD-10-CM | POA: Diagnosis not present

## 2017-06-07 DIAGNOSIS — R45851 Suicidal ideations: Secondary | ICD-10-CM | POA: Diagnosis not present

## 2017-06-07 DIAGNOSIS — F1124 Opioid dependence with opioid-induced mood disorder: Secondary | ICD-10-CM | POA: Diagnosis not present

## 2017-06-07 DIAGNOSIS — I252 Old myocardial infarction: Secondary | ICD-10-CM | POA: Diagnosis not present

## 2017-06-07 DIAGNOSIS — F17210 Nicotine dependence, cigarettes, uncomplicated: Secondary | ICD-10-CM | POA: Diagnosis not present

## 2017-06-07 DIAGNOSIS — I10 Essential (primary) hypertension: Secondary | ICD-10-CM | POA: Diagnosis present

## 2017-06-07 DIAGNOSIS — R450 Nervousness: Secondary | ICD-10-CM | POA: Diagnosis not present

## 2017-06-07 DIAGNOSIS — F1410 Cocaine abuse, uncomplicated: Secondary | ICD-10-CM | POA: Diagnosis not present

## 2017-06-07 DIAGNOSIS — M2550 Pain in unspecified joint: Secondary | ICD-10-CM | POA: Diagnosis not present

## 2017-06-07 DIAGNOSIS — F259 Schizoaffective disorder, unspecified: Secondary | ICD-10-CM | POA: Diagnosis present

## 2017-06-07 DIAGNOSIS — F1110 Opioid abuse, uncomplicated: Secondary | ICD-10-CM | POA: Diagnosis not present

## 2017-06-07 DIAGNOSIS — Z886 Allergy status to analgesic agent status: Secondary | ICD-10-CM | POA: Diagnosis not present

## 2017-06-07 DIAGNOSIS — R443 Hallucinations, unspecified: Secondary | ICD-10-CM | POA: Diagnosis not present

## 2017-06-07 DIAGNOSIS — M549 Dorsalgia, unspecified: Secondary | ICD-10-CM | POA: Diagnosis not present

## 2017-06-07 DIAGNOSIS — Z79899 Other long term (current) drug therapy: Secondary | ICD-10-CM | POA: Diagnosis not present

## 2017-06-07 DIAGNOSIS — F1420 Cocaine dependence, uncomplicated: Secondary | ICD-10-CM | POA: Diagnosis not present

## 2017-06-07 DIAGNOSIS — F4310 Post-traumatic stress disorder, unspecified: Secondary | ICD-10-CM | POA: Diagnosis not present

## 2017-06-07 DIAGNOSIS — Z915 Personal history of self-harm: Secondary | ICD-10-CM | POA: Diagnosis not present

## 2017-06-07 DIAGNOSIS — R197 Diarrhea, unspecified: Secondary | ICD-10-CM | POA: Diagnosis not present

## 2017-06-07 DIAGNOSIS — F1024 Alcohol dependence with alcohol-induced mood disorder: Principal | ICD-10-CM | POA: Diagnosis present

## 2017-06-07 MED ORDER — DICYCLOMINE HCL 20 MG PO TABS
20.0000 mg | ORAL_TABLET | Freq: Four times a day (QID) | ORAL | Status: DC | PRN
  Administered 2017-06-08 – 2017-06-09 (×3): 20 mg via ORAL
  Filled 2017-06-07 (×3): qty 1

## 2017-06-07 MED ORDER — ACETAMINOPHEN 325 MG PO TABS
650.0000 mg | ORAL_TABLET | Freq: Four times a day (QID) | ORAL | Status: DC | PRN
  Administered 2017-06-09 – 2017-06-10 (×2): 650 mg via ORAL
  Filled 2017-06-07 (×2): qty 2

## 2017-06-07 MED ORDER — THIAMINE HCL 100 MG/ML IJ SOLN: 100.0000 mg | Freq: Once | INTRAMUSCULAR | Status: DC

## 2017-06-07 MED ORDER — GABAPENTIN 100 MG PO CAPS
100.0000 mg | ORAL_CAPSULE | Freq: Three times a day (TID) | ORAL | Status: DC
  Administered 2017-06-07 – 2017-06-09 (×6): 100 mg via ORAL
  Filled 2017-06-07 (×12): qty 1

## 2017-06-07 MED ORDER — LORAZEPAM 1 MG PO TABS
1.0000 mg | ORAL_TABLET | Freq: Two times a day (BID) | ORAL | Status: AC
  Administered 2017-06-09 (×2): 1 mg via ORAL
  Filled 2017-06-07 (×2): qty 1

## 2017-06-07 MED ORDER — NICOTINE 21 MG/24HR TD PT24
21.0000 mg | MEDICATED_PATCH | Freq: Every day | TRANSDERMAL | Status: DC
  Administered 2017-06-07 – 2017-06-11 (×5): 21 mg via TRANSDERMAL
  Filled 2017-06-07 (×8): qty 1

## 2017-06-07 MED ORDER — HYDROXYZINE HCL 25 MG PO TABS: 25.0000 mg | ORAL_TABLET | Freq: Four times a day (QID) | ORAL | Status: DC | PRN

## 2017-06-07 MED ORDER — HYDROXYZINE HCL 25 MG PO TABS
25.0000 mg | ORAL_TABLET | Freq: Four times a day (QID) | ORAL | Status: DC | PRN
  Administered 2017-06-08 – 2017-06-10 (×5): 25 mg via ORAL
  Filled 2017-06-07 (×5): qty 1

## 2017-06-07 MED ORDER — LOPERAMIDE HCL 2 MG PO CAPS
2.0000 mg | ORAL_CAPSULE | ORAL | Status: DC | PRN
  Administered 2017-06-08: 4 mg via ORAL
  Administered 2017-06-09: 2 mg via ORAL
  Filled 2017-06-07: qty 1
  Filled 2017-06-07: qty 2

## 2017-06-07 MED ORDER — QUETIAPINE FUMARATE 25 MG PO TABS
25.0000 mg | ORAL_TABLET | Freq: Every day | ORAL | Status: DC
  Administered 2017-06-07 – 2017-06-10 (×4): 25 mg via ORAL
  Filled 2017-06-07 (×7): qty 1

## 2017-06-07 MED ORDER — CLONIDINE HCL 0.1 MG PO TABS
0.1000 mg | ORAL_TABLET | Freq: Every day | ORAL | Status: DC
  Administered 2017-06-11: 0.1 mg via ORAL
  Filled 2017-06-07 (×2): qty 1

## 2017-06-07 MED ORDER — VITAMIN B-1 100 MG PO TABS
100.0000 mg | ORAL_TABLET | Freq: Every day | ORAL | Status: DC
  Administered 2017-06-08 – 2017-06-11 (×4): 100 mg via ORAL
  Filled 2017-06-07 (×6): qty 1

## 2017-06-07 MED ORDER — PRAZOSIN HCL 1 MG PO CAPS
1.0000 mg | ORAL_CAPSULE | Freq: Every day | ORAL | Status: DC
  Filled 2017-06-07 (×3): qty 1

## 2017-06-07 MED ORDER — CLONIDINE HCL 0.1 MG PO TABS
0.1000 mg | ORAL_TABLET | ORAL | Status: AC
  Administered 2017-06-09 – 2017-06-10 (×4): 0.1 mg via ORAL
  Filled 2017-06-07 (×4): qty 1

## 2017-06-07 MED ORDER — LORAZEPAM 1 MG PO TABS
1.0000 mg | ORAL_TABLET | Freq: Four times a day (QID) | ORAL | Status: AC | PRN
  Administered 2017-06-08 (×2): 1 mg via ORAL
  Filled 2017-06-07 (×2): qty 1

## 2017-06-07 MED ORDER — QUETIAPINE FUMARATE 100 MG PO TABS
100.0000 mg | ORAL_TABLET | Freq: Every day | ORAL | Status: DC
  Filled 2017-06-07 (×3): qty 1

## 2017-06-07 MED ORDER — CLONIDINE HCL 0.1 MG PO TABS
0.1000 mg | ORAL_TABLET | Freq: Four times a day (QID) | ORAL | Status: AC
  Administered 2017-06-07 – 2017-06-08 (×7): 0.1 mg via ORAL
  Filled 2017-06-07 (×10): qty 1

## 2017-06-07 MED ORDER — METHOCARBAMOL 500 MG PO TABS
500.0000 mg | ORAL_TABLET | Freq: Three times a day (TID) | ORAL | Status: DC | PRN
  Administered 2017-06-08 – 2017-06-10 (×4): 500 mg via ORAL
  Filled 2017-06-07 (×4): qty 1

## 2017-06-07 MED ORDER — ADULT MULTIVITAMIN W/MINERALS CH
1.0000 | ORAL_TABLET | Freq: Every day | ORAL | Status: DC
  Administered 2017-06-07 – 2017-06-11 (×5): 1 via ORAL
  Filled 2017-06-07 (×7): qty 1

## 2017-06-07 MED ORDER — LORAZEPAM 1 MG PO TABS
1.0000 mg | ORAL_TABLET | Freq: Three times a day (TID) | ORAL | Status: AC
  Administered 2017-06-08 (×2): 1 mg via ORAL
  Filled 2017-06-07 (×3): qty 1

## 2017-06-07 MED ORDER — LORAZEPAM 1 MG PO TABS
1.0000 mg | ORAL_TABLET | Freq: Four times a day (QID) | ORAL | Status: AC
  Administered 2017-06-07 (×4): 1 mg via ORAL
  Filled 2017-06-07 (×4): qty 1

## 2017-06-07 MED ORDER — CITALOPRAM HYDROBROMIDE 10 MG PO TABS
10.0000 mg | ORAL_TABLET | ORAL | Status: DC
  Administered 2017-06-08 – 2017-06-11 (×4): 10 mg via ORAL
  Filled 2017-06-07 (×6): qty 1

## 2017-06-07 MED ORDER — LORAZEPAM 1 MG PO TABS
1.0000 mg | ORAL_TABLET | Freq: Every day | ORAL | Status: AC
  Administered 2017-06-10: 1 mg via ORAL
  Filled 2017-06-07: qty 1

## 2017-06-07 MED ORDER — CITALOPRAM HYDROBROMIDE 20 MG PO TABS
20.0000 mg | ORAL_TABLET | ORAL | Status: DC
  Administered 2017-06-07: 20 mg via ORAL
  Filled 2017-06-07 (×4): qty 1

## 2017-06-07 MED ORDER — GABAPENTIN 400 MG PO CAPS
400.0000 mg | ORAL_CAPSULE | Freq: Three times a day (TID) | ORAL | Status: DC
  Administered 2017-06-07 (×2): 400 mg via ORAL
  Filled 2017-06-07 (×8): qty 1

## 2017-06-07 MED ORDER — MAGNESIUM HYDROXIDE 400 MG/5ML PO SUSP
30.0000 mL | Freq: Every day | ORAL | Status: DC | PRN
Start: 2017-06-07 — End: 2017-06-11

## 2017-06-07 MED ORDER — ONDANSETRON 4 MG PO TBDP
4.0000 mg | ORAL_TABLET | Freq: Four times a day (QID) | ORAL | Status: DC | PRN
  Administered 2017-06-08 – 2017-06-09 (×2): 4 mg via ORAL
  Filled 2017-06-07 (×2): qty 1

## 2017-06-07 NOTE — ED Notes (Signed)
Report given to Marcelino DusterMichelle RN at [facility] , pt. will be transferred to room 307-2 ,[facility] transport service notified for pt. transport , pt. signed consent to transfer .
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
## 2017-06-07 NOTE — Tx Team (Cosign Needed)
Initial Treatment Plan 06/07/2017 1:41 AM Scott [name] ZOX:[id number]    PATIENT STRESSORS: Substance abuse   PATIENT STRENGTHS: Ability for insight Active sense of humor Average or above average intelligence Capable of independent living Communication skills Financial means General fund of knowledge Motivation for treatment/growth Supportive family/friends   PATIENT IDENTIFIED PROBLEMS: Substance Abuse  Depression                   DISCHARGE CRITERIA:  Withdrawal symptoms are absent or subacute and managed without 24-hour nursing intervention  PRELIMINARY DISCHARGE PLAN: Outpatient therapy Return to previous living arrangement  PATIENT/FAMILY INVOLVEMENT: This treatment plan has been presented to and reviewed with the patient, Scott [name]. The patient has been given the opportunity to ask questions and make suggestions. Pt wants to work on his alcohol and drug addiction, and dealing with depression.  Renaee MundaSadler, Eulamae Greenstein Thomas, RN 06/07/2017, 1:41 AM

## 2017-06-07 NOTE — Progress Notes (Signed)
Patient ID: Scott [name], male   DOB: September 09, 1970, 46 y.o.   MRN: [id number] PER STATE REGULATIONS 482.30  THIS CHART WAS REVIEWED FOR MEDICAL NECESSITY WITH RESPECT TO THE PATIENT'S ADMISSION/DURATION OF STAY.  NEXT REVIEW DATE: 06/11/17  Loura HaltBARBARA Tery Hoeger, RN, BSN CASE MANAGER
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
## 2017-06-07 NOTE — Progress Notes (Signed)
Patient ID: Scott [name], male   DOB: [birth year], 46 y.o.   MRN: [id number]   Admission Note: Patient admitted to unit for alcohol detox and heroin abuse with suicidal ideations. Pt. arrived to unit voluntarily, pt states his wife accompanied him to the ER to get help with his substance abuse and depression. Patient pleasant and cooperative with staff on admission and is noted to joke appropriately at times and he presents with a bright affect. Pt with complaints of auditory hallucinations and states he hears jumbled voices that sound like static and he cannot make out what they are saying. Pt denies any visual or tactile hallucinations at this time. Patient states he has good support from his wife and 3 children, which he lives with. 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 Patient denies plans to harm himself and he verbally contracts for safety on admission. No signs of distress noted. Skin warm, dry and intact upon inspection; no noted wounds or open areas to body. Per patient, his wife took all of his personal belongings home from the ER. Pt with no dentures, hearing devices, or glasses. Pt in paper scrubs. Pt ambulated to his room and is steady.

## 2017-06-07 NOTE — [facility] Group Notes (Signed)
LCSW Group Therapy Note   06/07/2017 1:15pm   Type of Therapy and Topic:  Group Therapy:  Overcoming Obstacles   Participation Level: Pt was present for the duration of the group. However, pt did not participate and slept the whole time instead.  Jonathon JordanLynn B Maripat Borba, MSW, LCSWA 06/07/2017 3:56 PM

## 2017-06-07 NOTE — Progress Notes (Signed)
Recreation Therapy Notes  Animal-Assisted Activity (AAA) Program Checklist/Progress Notes Patient Eligibility Criteria Checklist & Daily Group note for Rec TxIntervention  Date: 10.30.2018 Time: 2:45pm Location: [address] Dayroom   AAA/T Program Assumption of Risk Form signed by Patient/ or Parent Legal Guardian Yes  Patient is free of allergies or sever asthma Yes  Patient reports no fear of animals Yes  Patient reports no history of cruelty to animals Yes  Patient understands his/her participation is voluntary Yes  Behavioral Response: Did not attend.   Carlis Blanchard L Skyllar Notarianni, LRT/CTRS        Aleera Gilcrease L 06/07/2017 3:00 PM 
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
## 2017-06-07 NOTE — Progress Notes (Signed)
DAR NOTE: Patient presents with anxious affect and depressed mood. Pt has been isolating himself in the room the whole shift. Pt complained of withdrawal symptoms, shakes and chills. Pt stated he uses alcohol and drugs to medicate himself. Denies pain, auditory and visual hallucinations.  Rates depression at 8, hopelessness at 8, and anxiety at 8.  Maintained on routine safety checks.  Medications given as prescribed.  Support and encouragement offered as needed.  Will continue to monitor.

## 2017-06-08 DIAGNOSIS — F1110 Opioid abuse, uncomplicated: Secondary | ICD-10-CM

## 2017-06-08 DIAGNOSIS — R197 Diarrhea, unspecified: Secondary | ICD-10-CM

## 2017-06-08 DIAGNOSIS — F259 Schizoaffective disorder, unspecified: Secondary | ICD-10-CM

## 2017-06-08 DIAGNOSIS — M549 Dorsalgia, unspecified: Secondary | ICD-10-CM

## 2017-06-08 DIAGNOSIS — R45851 Suicidal ideations: Secondary | ICD-10-CM

## 2017-06-08 DIAGNOSIS — F17210 Nicotine dependence, cigarettes, uncomplicated: Secondary | ICD-10-CM

## 2017-06-08 DIAGNOSIS — M2550 Pain in unspecified joint: Secondary | ICD-10-CM

## 2017-06-08 DIAGNOSIS — F39 Unspecified mood [affective] disorder: Secondary | ICD-10-CM

## 2017-06-08 DIAGNOSIS — R443 Hallucinations, unspecified: Secondary | ICD-10-CM

## 2017-06-08 DIAGNOSIS — F1410 Cocaine abuse, uncomplicated: Secondary | ICD-10-CM

## 2017-06-08 LAB — HIV ANTIBODY (ROUTINE TESTING W REFLEX): HIV Screen 4th Generation wRfx: NONREACTIVE

## 2017-06-08 LAB — HEPATIC FUNCTION PANEL
ALT: 400 U/L — ABNORMAL HIGH (ref 17–63)
AST: 241 U/L — ABNORMAL HIGH (ref 15–41)
Albumin: 3.8 g/dL (ref 3.5–5.0)
Alkaline Phosphatase: 104 U/L (ref 38–126)
Bilirubin, Direct: 0.2 mg/dL (ref 0.1–0.5)
Indirect Bilirubin: 1 mg/dL — ABNORMAL HIGH (ref 0.3–0.9)
Total Bilirubin: 1.2 mg/dL (ref 0.3–1.2)
Total Protein: 7.1 g/dL (ref 6.5–8.1)

## 2017-06-08 NOTE — Progress Notes (Signed)
D:  Patient denied SI and HI, contracts for safety.  Denied A/V hallucinations. A:  Medications administered per MD orders.  Emotional support and encouragement given patient. R:  Safety maintained with 15 minute checks.  Patient has been sleeping most of the day. Patient not given ativan and clonidine at 1700 because of low BP 93/51 P62.

## 2017-06-08 NOTE — Progress Notes (Signed)
Pt reports he is having moderate withdrawal symptoms at this time.  He was in the dayroom at the beginning of the shift, but shortly afterwards he went to his room and lay down.  He denies SI/HI, but says he is hearing muffled voices which he has been hearing for some time.  He says he has night terrors from his PTSD.  He is hopeful to start meds that will help him deal with his mental health issues and discourage his substance abuse.  He says his wife supports him, but he doesn't know how long she will continue to be supportive.  He has been pleasant and cooperative with staff.  Support and encouragement offered.  Discharge plans are in process.  Safety maintained with q15 minute checks.

## 2017-06-08 NOTE — [facility] Group Notes (Signed)
LCSW Group Therapy Note  06/08/2017 1:15pm  Type of Therapy/Topic:  Group Therapy:  Emotion Regulation  Participation Level: Pt invited, did not attend.   Jonathon JordanLynn B Janalynn Eder, MSW, LCSWA 06/08/2017 2:59 PM

## 2017-06-08 NOTE — Tx Team (Signed)
Interdisciplinary Treatment and Diagnostic Plan Update 06/08/2017 Time of Session: 9:30am  Scott [name]  MRN: [id number]  Principal Diagnosis: Schizoaffective disorder [facility])  Secondary Diagnoses: Principal Problem:   Schizoaffective disorder ([place]) Active Problems:   PTSD (post-traumatic stress disorder)   Opioid type dependence, continuous (HCC)   Alcohol use disorder, severe, dependence ([place])   Current Medications:  Current Facility-Administered Medications  Medication Dose Route Frequency Provider Last Rate Last Dose  . acetaminophen (TYLENOL) tablet 650 mg  650 mg Oral Q6H PRN Laverle Hobby, PA-C      . citalopram (CELEXA) tablet 10 mg  10 mg Oral BH-q7a Cobos, Myer Peer, MD   10 mg at 06/08/17 0631  . cloNIDine (CATAPRES) tablet 0.1 mg  0.1 mg Oral QID Laverle Hobby, PA-C   0.1 mg at 06/08/17 2979   Followed by  . [START ON 06/09/2017] cloNIDine (CATAPRES) tablet 0.1 mg  0.1 mg Oral BH-qamhs Simon, Spencer E, PA-C       Followed by  . [START ON 06/11/2017] cloNIDine (CATAPRES) tablet 0.1 mg  0.1 mg Oral QAC breakfast Laverle Hobby, PA-C      . dicyclomine (BENTYL) tablet 20 mg  20 mg Oral Q6H PRN Laverle Hobby, PA-C      . gabapentin (NEURONTIN) capsule 100 mg  100 mg Oral TID Cobos, Myer Peer, MD   100 mg at 06/08/17 0830  . hydrOXYzine (ATARAX/VISTARIL) tablet 25 mg  25 mg Oral Q6H PRN Laverle Hobby, PA-C   25 mg at 06/08/17 0116  . loperamide (IMODIUM) capsule 2-4 mg  2-4 mg Oral PRN Laverle Hobby, PA-C      . LORazepam (ATIVAN) tablet 1 mg  1 mg Oral Q6H PRN Laverle Hobby, PA-C   1 mg at 06/08/17 0116  . LORazepam (ATIVAN) tablet 1 mg  1 mg Oral TID Patriciaann Clan E, PA-C   1 mg at 06/08/17 0830   Followed by  . [START ON 06/09/2017] LORazepam (ATIVAN) tablet 1 mg  1 mg Oral BID Patriciaann Clan E, PA-C       Followed by  . [START ON 06/10/2017] LORazepam (ATIVAN) tablet 1 mg  1 mg Oral Daily Simon, Spencer E, PA-C      . magnesium hydroxide (MILK OF  MAGNESIA) suspension 30 mL  30 mL Oral Daily PRN Laverle Hobby, PA-C      . methocarbamol (ROBAXIN) tablet 500 mg  500 mg Oral Q8H PRN Patriciaann Clan E, PA-C   500 mg at 06/08/17 8921  . multivitamin with minerals tablet 1 tablet  1 tablet Oral Daily Laverle Hobby, PA-C   1 tablet at 06/08/17 1941  . nicotine (NICODERM CQ - dosed in mg/24 hours) patch 21 mg  21 mg Transdermal Daily Patriciaann Clan E, PA-C   21 mg at 06/08/17 7408  . ondansetron (ZOFRAN-ODT) disintegrating tablet 4 mg  4 mg Oral Q6H PRN Laverle Hobby, PA-C      . QUEtiapine (SEROQUEL) tablet 25 mg  25 mg Oral QHS Cobos, Myer Peer, MD   25 mg at 06/07/17 2120  . thiamine (B-1) injection 100 mg  100 mg Intramuscular Once Patriciaann Clan E, PA-C      . thiamine (VITAMIN B-1) tablet 100 mg  100 mg Oral Daily Laverle Hobby, PA-C   100 mg at 06/08/17 1448    PTA Medications: Prescriptions Prior to Admission  Medication Sig Dispense Refill Last Dose  . busPIRone (BUSPAR) 7.5 MG tablet Take  1 tablet (7.5 mg total) by mouth 3 (three) times daily. (Patient taking differently: Take 15 mg by mouth 3 (three) times daily. ) 90 tablet 0 06/05/2017 at Unknown time  . citalopram (CELEXA) 10 MG tablet Take 3 tablets (30 mg total) by mouth every morning. (Patient taking differently: Take 20 mg by mouth every morning. ) 90 tablet 0 06/05/2017 at Unknown time  . gabapentin (NEURONTIN) 400 MG capsule Take 2 capsules (800 mg total) by mouth 3 (three) times daily. 180 capsule 0 06/05/2017 at Unknown time  . loperamide (IMODIUM A-D) 2 MG tablet Take 2 tablets (4 mg total) by mouth 4 (four) times daily as needed for diarrhea or loose stools. 30 tablet 0 unk  . LORazepam (ATIVAN) 1 MG tablet Take 1 tablet (1 mg total) by mouth 3 (three) times daily. (Patient taking differently: Take 1 mg by mouth 3 (three) times daily as needed for anxiety. ) 42 tablet 0 Past Week at Unknown time  . Multiple Vitamin (MULTIVITAMIN WITH MINERALS) TABS tablet Take 1  tablet by mouth daily. 30 tablet 0 06/06/2017 at Unknown time  . Naloxone HCl 0.4 MG/0.4ML SOAJ Inject 1 application as directed once. Dispense 1 naloxone autoinjector kit (Patient taking differently: Inject 1 application as directed daily as needed (overdose). Dispense 1 naloxone autoinjector kit) 1 Package 0 unk  . nicotine (NICODERM CQ - DOSED IN MG/24 HOURS) 21 mg/24hr patch Place 1 patch (21 mg total) onto the skin daily. (Patient taking differently: Place 21 mg onto the skin daily as needed (nicotine dependence). ) 28 patch 0 unk  . ondansetron (ZOFRAN ODT) 8 MG disintegrating tablet Take 1 tablet (8 mg total) by mouth every 8 (eight) hours as needed for nausea or vomiting. 20 tablet 0 unk  . potassium chloride (K-DUR,KLOR-CON) 10 MEQ tablet Take 1 tablet (10 mEq total) by mouth daily. To prevent low potassium from diuretic usage. (Patient taking differently: Take 10 mEq by mouth daily. ) 30 tablet 0 06/05/2017 at Unknown time  . prazosin (MINIPRESS) 2 MG capsule Take 2 mg by mouth at bedtime.   06/05/2017 at Unknown time  . QUEtiapine (SEROQUEL) 300 MG tablet Take 300 mg by mouth at bedtime.   06/05/2017 at Unknown time    Treatment Modalities: Medication Management, Group therapy, Case management,  1 to 1 session with clinician, Psychoeducation, Recreational therapy.  Patient Stressors: Substance abuse Patient Strengths: Ability for insight Active sense of humor Average or above average intelligence Capable of independent living Occupational psychologist fund of knowledge Motivation for treatment/growth Supportive family/friends  Physician Treatment Plan for Primary Diagnosis: Schizoaffective disorder ([place]) Long Term Goal(s): Improvement in symptoms so as ready for discharge Short Term Goals: Ability to maintain clinical measurements within normal limits will improve Compliance with prescribed medications will improve Ability to verbalize feelings will  improve Ability to disclose and discuss suicidal ideas Ability to demonstrate self-control will improve Ability to identify and develop effective coping behaviors will improve  Medication Management: Evaluate patient's response, side effects, and tolerance of medication regimen.  Therapeutic Interventions: 1 to 1 sessions, Unit Group sessions and Medication administration.  Evaluation of Outcomes: Not Met  Physician Treatment Plan for Secondary Diagnosis: Principal Problem:   Schizoaffective disorder ([place]) Active Problems:   PTSD (post-traumatic stress disorder)   Opioid type dependence, continuous (HCC)   Alcohol use disorder, severe, dependence ([place])  Long Term Goal(s): Improvement in symptoms so as ready for discharge  Short Term Goals: Ability to maintain clinical measurements  within normal limits will improve Compliance with prescribed medications will improve Ability to verbalize feelings will improve Ability to disclose and discuss suicidal ideas Ability to demonstrate self-control will improve Ability to identify and develop effective coping behaviors will improve  Medication Management: Evaluate patient's response, side effects, and tolerance of medication regimen.  Therapeutic Interventions: 1 to 1 sessions, Unit Group sessions and Medication administration.  Evaluation of Outcomes: Not Met  RN Treatment Plan for Primary Diagnosis: Schizoaffective disorder ([place]) Long Term Goal(s): Knowledge of disease and therapeutic regimen to maintain health will improve  Short Term Goals: Compliance with prescribed medications will improve  Medication Management: RN will administer medications as ordered by provider, will assess and evaluate patient's response and provide education to patient for prescribed medication. RN will report any adverse and/or side effects to prescribing provider.  Therapeutic Interventions: 1 on 1 counseling sessions, Psychoeducation, Medication  administration, Evaluate responses to treatment, Monitor vital signs and CBGs as ordered, Perform/monitor CIWA, COWS, AIMS and Fall Risk screenings as ordered, Perform wound care treatments as ordered.  Evaluation of Outcomes: Not Met  LCSW Treatment Plan for Primary Diagnosis: Schizoaffective disorder ([place]) Long Term Goal(s): Safe transition to appropriate next level of care at discharge, Engage patient in therapeutic group addressing interpersonal concerns. Short Term Goals: Engage patient in aftercare planning with referrals and resources, Facilitate patient progression through stages of change regarding substance use diagnoses and concerns, Identify triggers associated with mental health/substance abuse issues and Increase skills for wellness and recovery  Therapeutic Interventions: Assess for all discharge needs, 1 to 1 time with Social worker, Explore available resources and support systems, Assess for adequacy in community support network, Educate family and significant other(s) on suicide prevention, Complete Psychosocial Assessment, Interpersonal group therapy.  Evaluation of Outcomes: Not Met  Progress in Treatment: Attending groups: No Participating in groups: No Taking medication as prescribed: Yes, MD continues to assess for medication changes as needed Toleration medication: Yes, no side effects reported at this time Family/Significant other contact made: No, CSW will make contact if pt consents. Patient understands diagnosis: Continuing to assess  Discussing patient identified problems/goals with staff: Yes Medical problems stabilized or resolved: Yes Denies suicidal/homicidal ideation: No, pt recently admitted with SI Issues/concerns per patient self-inventory: None Other: N/A  New problem(s) identified: None identified at this time.   New Short Term/Long Term Goal(s): None identified at this time.   Discharge Plan or Barriers: CSW still assessing for an appropriate  plan.  Reason for Continuation of Hospitalization:   Depression Medication stabilization Suicidal ideation Withdrawal symptoms  Estimated Length of Stay: 3-5 days; Estimated discharge date 06/13/17  Attendees: Patient: 06/08/2017 11:19 AM  Physician: Dr. Nancy Fetter  06/08/2017 11:19 AM  Nursing: Rise Paganini, RN 06/08/2017 11:19 AM  RN Care Manager: Lars Pinks, RN 06/08/2017 11:19 AM  Social Worker: Matthew Saras, [place] 06/08/2017 11:19 AM  Recreational Therapist:  06/08/2017 11:19 AM  Other: Lindell Spar, NP 06/08/2017 11:19 AM  Other:  06/08/2017 11:19 AM  Other: 06/08/2017 11:19 AM  Scribe for Treatment Team: Georga Kaufmann, MSW,LCSWA 06/08/2017 11:19 AM

## 2017-06-08 NOTE — Progress Notes (Signed)
Ativan 1 mg po and clonidine 0.1 mg po not given at 1700 because BP 93/51 P62.  Discussed with charge nurse/staff. Patient resting in bed.  Respirations even and unlabored.  No signs/symptoms of pain/distress noted on patient's face/body movements. Safety maintained with 15 minute checks.

## 2017-06-08 NOTE — Progress Notes (Signed)
[facility] MD Progress Note  06/08/2017 3:05 PM Scott [name]  MRN:  [id number]   Subjective:  Patient reports that he is having some withdrawal symptoms of diarrhea, minor occasional hallucinations, and some back pain. He admits depression but denies any SI/HI. He denies any AVH when he has remained sober and feels it is just some of the withdrawal symptoms.   Objective: Patient's chart and findings reviewed with treatment team. Patient is cooperative. Patient seems to be coping with his withdrawal symptoms well. He has experienced them before and has said he knows it is going to happen.   Principal Problem: Schizoaffective disorder (HCC) Diagnosis:   Patient Active Problem List   Diagnosis Date Noted  . Schizoaffective disorder (HCC) [F25.9] 06/07/2017  . Alcohol use disorder, severe, dependence (HCC) [F10.20] 07/24/2015  . Alcohol withdrawal (HCC) [F10.239] 07/24/2015  . Cocaine use disorder, mild, abuse (HCC) [F14.10] 07/24/2015  . Substance induced mood disorder (HCC) [F19.94] 07/22/2015  . Drug-seeking behavior [Z76.5] 02/16/2015  . PTSD (post-traumatic stress disorder) [F43.10] 12/26/2014  . Opioid type dependence, continuous (HCC) [F11.20] 12/26/2014  . Major depressive disorder, recurrent episode, moderate (HCC) [F33.1]   . Chest pain at rest [R07.9] 10/22/2013  . Chronic pain syndrome [G89.4] 10/22/2013  . Seizure disorder (HCC) [G40.909] 10/22/2013  . Chest pain [R07.9] 10/21/2013   Total Time spent with patient: 15 minutes  Past Psychiatric History: See H&P  Past Medical History:  Past Medical History:  Diagnosis Date  . Accidental heroin overdose (HCC)   . Anxiety   . Back pain   . Bipolar 1 disorder (HCC)   . Chronic back pain   . Depression   . Drug-seeking behavior   . GERD (gastroesophageal reflux disease)   . Heroin abuse (HCC)   . History of ETOH abuse   . Myocardial infarction (HCC)   . Opioid dependence (HCC)   . PTSD (post-traumatic stress disorder)   .  Seizures (HCC)     Past Surgical History:  Procedure Laterality Date  . ABDOMINAL SURGERY    . APPENDECTOMY    . BACK SURGERY     Family History:  Family History  Problem Relation Age of Onset  . Hypertension Mother   . CAD Father   . COPD Father   . Stroke Father   . Testicular cancer Brother    Family Psychiatric  History: See H&P Social History:  History  Alcohol Use  . Yes    Comment: a fifth and a half of whiskey per day     History  Drug Use  . Types: Heroin, Cocaine    Comment: heroin    Social History   Social History  . Marital status: Married    Spouse name: N/A  . Number of children: N/A  . Years of education: N/A   Social History Main Topics  . Smoking status: Current Every Day Smoker    Packs/day: 1.50    Types: Cigarettes  . Smokeless tobacco: Never Used  . Alcohol use Yes     Comment: a fifth and a half of whiskey per day  . Drug use: Yes    Types: Heroin, Cocaine     Comment: heroin  . Sexual activity: Yes     Comment: heroin   Other Topics Concern  . None   Social History Narrative  . None   Additional Social History:    History of alcohol / drug use?: Yes Longest period of sobriety (when/how long): unknown Negative Consequences of Use:  Financial                    Sleep: Fair  Appetite:  Fair  Current Medications: Current Facility-Administered Medications  Medication Dose Route Frequency Provider Last Rate Last Dose  . acetaminophen (TYLENOL) tablet 650 mg  650 mg Oral Q6H PRN Kerry HoughSimon, Spencer E, PA-C      . citalopram (CELEXA) tablet 10 mg  10 mg Oral BH-q7a Destan Franchini, Rockey SituFernando A, MD   10 mg at 06/08/17 0631  . cloNIDine (CATAPRES) tablet 0.1 mg  0.1 mg Oral QID Donell SievertSimon, Spencer E, PA-C   0.1 mg at 06/08/17 1214   Followed by  . [START ON 06/09/2017] cloNIDine (CATAPRES) tablet 0.1 mg  0.1 mg Oral BH-qamhs Simon, Spencer E, PA-C       Followed by  . [START ON 06/11/2017] cloNIDine (CATAPRES) tablet 0.1 mg  0.1 mg Oral QAC  breakfast Kerry HoughSimon, Spencer E, PA-C      . dicyclomine (BENTYL) tablet 20 mg  20 mg Oral Q6H PRN Kerry HoughSimon, Spencer E, PA-C      . gabapentin (NEURONTIN) capsule 100 mg  100 mg Oral TID Rubens Cranston, Rockey SituFernando A, MD   100 mg at 06/08/17 1215  . hydrOXYzine (ATARAX/VISTARIL) tablet 25 mg  25 mg Oral Q6H PRN Kerry HoughSimon, Spencer E, PA-C   25 mg at 06/08/17 0116  . loperamide (IMODIUM) capsule 2-4 mg  2-4 mg Oral PRN Kerry HoughSimon, Spencer E, PA-C      . LORazepam (ATIVAN) tablet 1 mg  1 mg Oral Q6H PRN Kerry HoughSimon, Spencer E, PA-C   1 mg at 06/08/17 0116  . LORazepam (ATIVAN) tablet 1 mg  1 mg Oral TID Kerry HoughSimon, Spencer E, PA-C   1 mg at 06/08/17 1217   Followed by  . [START ON 06/09/2017] LORazepam (ATIVAN) tablet 1 mg  1 mg Oral BID Donell SievertSimon, Spencer E, PA-C       Followed by  . [START ON 06/10/2017] LORazepam (ATIVAN) tablet 1 mg  1 mg Oral Daily Simon, Spencer E, PA-C      . magnesium hydroxide (MILK OF MAGNESIA) suspension 30 mL  30 mL Oral Daily PRN Kerry HoughSimon, Spencer E, PA-C      . methocarbamol (ROBAXIN) tablet 500 mg  500 mg Oral Q8H PRN Donell SievertSimon, Spencer E, PA-C   500 mg at 06/08/17 [id number]  . multivitamin with minerals tablet 1 tablet  1 tablet Oral Daily Kerry HoughSimon, Spencer E, PA-C   1 tablet at 06/08/17 [id number]  . nicotine (NICODERM CQ - dosed in mg/24 hours) patch 21 mg  21 mg Transdermal Daily Donell SievertSimon, Spencer E, PA-C   21 mg at 06/08/17 [id number]  . ondansetron (ZOFRAN-ODT) disintegrating tablet 4 mg  4 mg Oral Q6H PRN Kerry HoughSimon, Spencer E, PA-C      . QUEtiapine (SEROQUEL) tablet 25 mg  25 mg Oral QHS Silvano Garofano, Rockey SituFernando A, MD   25 mg at 06/07/17 2120  . thiamine (B-1) injection 100 mg  100 mg Intramuscular Once Donell SievertSimon, Spencer E, PA-C      . thiamine (VITAMIN B-1) tablet 100 mg  100 mg Oral Daily Kerry HoughSimon, Spencer E, PA-C   100 mg at 06/08/17 [id number]    Lab Results:  Results for orders placed or performed during the hospital encounter of 06/07/17 (from the past 48 hour(s))  HIV antibody     Status: None   Collection Time: 06/08/17  6:37 AM  Result Value Ref  Range   HIV Screen 4th Generation wRfx Non Reactive Non  Reactive    Comment: (NOTE) Performed At: [facility] [address] [place], [place] [id number] Mila Homer MD WU:[id number] Performed at [facility], [address] [place], [place] [address]   Hepatic function panel     Status: Abnormal   Collection Time: 06/08/17  6:37 AM  Result Value Ref Range   Total Protein 7.1 6.5 - 8.1 g/dL   Albumin 3.8 3.5 - 5.0 g/dL   AST 308 (H) 15 - 41 U/L   ALT 400 (H) 17 - 63 U/L   Alkaline Phosphatase 104 38 - 126 U/L   Total Bilirubin 1.2 0.3 - 1.2 mg/dL   Bilirubin, Direct 0.2 0.1 - 0.5 mg/dL   Indirect Bilirubin 1.0 (H) 0.3 - 0.9 mg/dL    Comment: Performed at [facility], [address] [place], [place] [address]    Blood Alcohol level:  Lab Results  Component Value Date   ETH <10 06/06/2017   ETH <5 07/21/2015    Metabolic Disorder Labs: No results found for: HGBA1C, MPG No results found for: PROLACTIN No results found for: CHOL, TRIG, HDL, CHOLHDL, VLDL, LDLCALC  Physical Findings: AIMS: Facial and Oral Movements Muscles of Facial Expression: None, normal Lips and Perioral Area: None, normal Jaw: None, normal Tongue: None, normal,Extremity Movements Upper (arms, wrists, hands, fingers): None, normal Lower (legs, knees, ankles, toes): None, normal, Trunk Movements Neck, shoulders, hips: None, normal, Overall Severity Severity of abnormal movements (highest score from questions above): None, normal Incapacitation due to abnormal movements: None, normal Patient's awareness of abnormal movements (rate only patient's report): No Awareness, Dental Status Current problems with teeth and/or dentures?: No Does patient usually wear dentures?: No  CIWA:  CIWA-Ar Total: 2 COWS:  COWS Total Score: 6  Musculoskeletal: Strength & Muscle Tone: within normal limits Gait & Station: normal Patient leans: N/A  Psychiatric  Specialty Exam: Physical Exam  Nursing note and vitals reviewed. Constitutional: He is oriented to person, place, and time. He appears well-developed and well-nourished.  Cardiovascular: Normal rate.   Respiratory: Effort normal.  Musculoskeletal: Normal range of motion.  Neurological: He is alert and oriented to person, place, and time.  Skin: Skin is warm.    Review of Systems  Constitutional: Negative.   Eyes: Negative.   Respiratory: Negative.   Cardiovascular: Negative.   Gastrointestinal: Positive for diarrhea.  Genitourinary: Negative.   Musculoskeletal: Positive for back pain and joint pain.  Neurological: Negative.   Endo/Heme/Allergies: Negative.   Psychiatric/Behavioral: Positive for depression, hallucinations and suicidal ideas.    Blood pressure 123/66, pulse 60, temperature 98.7 F (37.1 C), temperature source Oral, resp. rate 16, height 6' (1.829 m), weight 120.2 kg (265 lb).Body mass index is 35.94 kg/m.  General Appearance: Disheveled  Eye Contact:  Good  Speech:  Clear and Coherent and Normal Rate  Volume:  Normal  Mood:  Depressed  Affect:  Flat  Thought Process:  Goal Directed and Descriptions of Associations: Intact  Orientation:  Full (Time, Place, and Person)  Thought Content:  WDL occasional hallucinations from withdrawal  Suicidal Thoughts:  No  Homicidal Thoughts:  No  Memory:  Immediate;   Good Recent;   Good Remote;   Good  Judgement:  Good  Insight:  Good  Psychomotor Activity:  Normal  Concentration:  Concentration: Good and Attention Span: Good  Recall:  Good  Fund of Knowledge:  Good  Language:  Good  Akathisia:  No  Handed:  Right  AIMS (if indicated):  Assets:  Communication Skills Desire for Improvement Financial Resources/Insurance Housing Physical Health Social Support  ADL's:  Intact  Cognition:  WNL  Sleep:  Number of Hours: 2.5   Problems Addressed: PTSD Schizoaffective  Alcohol abuse Opioid  dependence  Treatment Plan Summary: Daily contact with patient to assess and evaluate symptoms and progress in treatment, Medication management and Plan is to:  -Continue Ativan Detox Protocol -Continue Clonidine Detox Protocol -Continue Gabapentin 100 mg PO TID for withdrawal symptoms -Continue Celexa 10 mg PO Daily for mood stability  -Continue Seroquel 25 mg PO QHS for mood stability -Encourage group therapy participation  Maryfrances Bunnell, FNP 06/08/2017, 3:05 PM   Agree with NP Progress Note

## 2017-06-09 LAB — HEPATITIS B SURFACE ANTIGEN: Hepatitis B Surface Ag: NEGATIVE

## 2017-06-09 LAB — HEPATITIS C ANTIBODY: HCV Ab: >11 s/co ratio — ABNORMAL HIGH (ref 0.0–0.9)

## 2017-06-09 MED ORDER — GABAPENTIN 300 MG PO CAPS
300.0000 mg | ORAL_CAPSULE | Freq: Three times a day (TID) | ORAL | Status: DC
  Administered 2017-06-09 – 2017-06-11 (×6): 300 mg via ORAL
  Filled 2017-06-09 (×12): qty 1

## 2017-06-09 NOTE — Plan of Care (Signed)
Problem: Education: Goal: Emotional status will improve Outcome: Progressing Nurse discussed depression/anxiety/coping skills with patient.    

## 2017-06-09 NOTE — [facility] Group Notes (Signed)
[facility] Group Therapy 06/09/2017 1:15pm  Type of Therapy: [facility] [employer] Presentation  Participation Level: Active  Participation Quality: Attentive  Affect: Appropriate  Cognitive: Oriented  Insight: Developing/Improving  Engagement in Therapy: Engaged  Modes of Intervention: Discussion, Education and Socialization  Summary of Progress/Problems: [facility] [employer] ([facility]) Speaker came to talk about his personal journey with mental health. The pt processed ways by which to relate to the speaker. MHA speaker provided handouts and educational information pertaining to groups and services offered by the MHA. Pt was engaged in speaker's presentation and was receptive to resources provided.    Scott [name] N Smart, LCSW 06/09/2017 3:26 PM  

## 2017-06-09 NOTE — Progress Notes (Signed)
Pt has been in bed most of the evening.  He states his withdrawal symptoms have been moderate all day.  He states he is having difficulty getting relief for his back pain.  He did not get up until after 2200.  He was medicated at that time.  While pt was still in the bed earlier in the shift, writer encouraged him to drink fluids so that his BP would increase to a level where he could take his withdrawal meds.  Pt was encouraged to make his needs known to staff.  He denies SI/HI/AVH.  He has been polite and cooperative.  Support and encouragement offered.  Discharge plans are in process.  Safety maintained with q15 minute checks.

## 2017-06-09 NOTE — [facility] Group Notes (Signed)
The focus of this group is to educate the patient on the purpose and policies of crisis stabilization and provide a format to answer questions about their admission.  The group details unit policies and expectations of patients while admitted.  Patient attended 0900 nurse education orientation group this morning.  Patient actively participated and had appropriate affect.  Patient was alert.  Patient had appropriate insight and appropriate engagements.  Today patient will work on 3 goals for discharge.   

## 2017-06-09 NOTE — Progress Notes (Signed)
[facility] MD Progress Note  06/09/2017 3:05 PM Scott [name]  MRN:  [id number]   Subjective:  Patient reports that he is feeling better today. He reports still feeling depressed and his chronic pain is bad today. He states that the pain is  A big issue at home. He gets depressed over the pain, uses drugs to stop the pain, then gets more depressed due to using drugs. "I feel like it's a never ending cycle."   Objective: Patient's chart and findings reviewed and discussed with treatment team. Patient presents pleasant and cooperative. He has stayed in the day room most of the day and has attended groups and interacted with peers and staff appropriately. Due to continued complaint of pain will increase Gabapentin to 300 mg TID.  Principal Problem: Schizoaffective disorder (HCC) Diagnosis:   Patient Active Problem List   Diagnosis Date Noted  . Schizoaffective disorder (HCC) [F25.9] 06/07/2017  . Alcohol use disorder, severe, dependence (HCC) [F10.20] 07/24/2015  . Alcohol withdrawal (HCC) [F10.239] 07/24/2015  . Cocaine use disorder, mild, abuse (HCC) [F14.10] 07/24/2015  . Substance induced mood disorder (HCC) [F19.94] 07/22/2015  . Drug-seeking behavior [Z76.5] 02/16/2015  . PTSD (post-traumatic stress disorder) [F43.10] 12/26/2014  . Opioid type dependence, continuous (HCC) [F11.20] 12/26/2014  . Major depressive disorder, recurrent episode, moderate (HCC) [F33.1]   . Chest pain at rest [R07.9] 10/22/2013  . Chronic pain syndrome [G89.4] 10/22/2013  . Seizure disorder (HCC) [G40.909] 10/22/2013  . Chest pain [R07.9] 10/21/2013   Total Time spent with patient: 25 minutes  Past Psychiatric History: See H&P  Past Medical History:  Past Medical History:  Diagnosis Date  . Accidental heroin overdose (HCC)   . Anxiety   . Back pain   . Bipolar 1 disorder (HCC)   . Chronic back pain   . Depression   . Drug-seeking behavior   . GERD (gastroesophageal reflux disease)   . Heroin abuse (HCC)    . History of ETOH abuse   . Myocardial infarction (HCC)   . Opioid dependence (HCC)   . PTSD (post-traumatic stress disorder)   . Seizures (HCC)     Past Surgical History:  Procedure Laterality Date  . ABDOMINAL SURGERY    . APPENDECTOMY    . BACK SURGERY     Family History:  Family History  Problem Relation Age of Onset  . Hypertension Mother   . CAD Father   . COPD Father   . Stroke Father   . Testicular cancer Brother    Family Psychiatric  History: See H&P Social History:  History  Alcohol Use  . Yes    Comment: a fifth and a half of whiskey per day     History  Drug Use  . Types: Heroin, Cocaine    Comment: heroin    Social History   Social History  . Marital status: Married    Spouse name: N/A  . Number of children: N/A  . Years of education: N/A   Social History Main Topics  . Smoking status: Current Every Day Smoker    Packs/day: 1.50    Types: Cigarettes  . Smokeless tobacco: Never Used  . Alcohol use Yes     Comment: a fifth and a half of whiskey per day  . Drug use: Yes    Types: Heroin, Cocaine     Comment: heroin  . Sexual activity: Yes     Comment: heroin   Other Topics Concern  . None   Social History Narrative  .  None   Additional Social History:    History of alcohol / drug use?: Yes Longest period of sobriety (when/how long): unknown Negative Consequences of Use: Financial                    Sleep: Good  Appetite:  Good  Current Medications: Current Facility-Administered Medications  Medication Dose Route Frequency Provider Last Rate Last Dose  . acetaminophen (TYLENOL) tablet 650 mg  650 mg Oral Q6H PRN Kerry HoughSimon, Spencer E, PA-C      . citalopram (CELEXA) tablet 10 mg  10 mg Oral BH-q7a Cobos, Rockey SituFernando A, MD   10 mg at 06/09/17 0636  . cloNIDine (CATAPRES) tablet 0.1 mg  0.1 mg Oral BH-qamhs Donell SievertSimon, Spencer E, PA-C   0.1 mg at 06/09/17 0801   Followed by  . [START ON 06/11/2017] cloNIDine (CATAPRES) tablet 0.1 mg   0.1 mg Oral QAC breakfast Kerry HoughSimon, Spencer E, PA-C      . dicyclomine (BENTYL) tablet 20 mg  20 mg Oral Q6H PRN Kerry HoughSimon, Spencer E, PA-C   20 mg at 06/08/17 2233  . gabapentin (NEURONTIN) capsule 300 mg  300 mg Oral TID Money, Gerlene Burdockravis B, FNP      . hydrOXYzine (ATARAX/VISTARIL) tablet 25 mg  25 mg Oral Q6H PRN Kerry HoughSimon, Spencer E, PA-C   25 mg at 06/08/17 2233  . loperamide (IMODIUM) capsule 2-4 mg  2-4 mg Oral PRN Donell SievertSimon, Spencer E, PA-C   2 mg at 06/09/17 [id number]  . LORazepam (ATIVAN) tablet 1 mg  1 mg Oral Q6H PRN Kerry HoughSimon, Spencer E, PA-C   1 mg at 06/08/17 2232  . LORazepam (ATIVAN) tablet 1 mg  1 mg Oral BID Kerry HoughSimon, Spencer E, PA-C   1 mg at 06/09/17 [id number]   Followed by  . [START ON 06/10/2017] LORazepam (ATIVAN) tablet 1 mg  1 mg Oral Daily Simon, Spencer E, PA-C      . magnesium hydroxide (MILK OF MAGNESIA) suspension 30 mL  30 mL Oral Daily PRN Kerry HoughSimon, Spencer E, PA-C      . methocarbamol (ROBAXIN) tablet 500 mg  500 mg Oral Q8H PRN Donell SievertSimon, Spencer E, PA-C   500 mg at 06/08/17 2233  . multivitamin with minerals tablet 1 tablet  1 tablet Oral Daily Kerry HoughSimon, Spencer E, PA-C   1 tablet at 06/09/17 0801  . nicotine (NICODERM CQ - dosed in mg/24 hours) patch 21 mg  21 mg Transdermal Daily Donell SievertSimon, Spencer E, PA-C   21 mg at 06/09/17 0802  . ondansetron (ZOFRAN-ODT) disintegrating tablet 4 mg  4 mg Oral Q6H PRN Kerry HoughSimon, Spencer E, PA-C   4 mg at 06/08/17 1514  . QUEtiapine (SEROQUEL) tablet 25 mg  25 mg Oral QHS Cobos, Rockey SituFernando A, MD   25 mg at 06/08/17 2233  . thiamine (B-1) injection 100 mg  100 mg Intramuscular Once Donell SievertSimon, Spencer E, PA-C      . thiamine (VITAMIN B-1) tablet 100 mg  100 mg Oral Daily Kerry HoughSimon, Spencer E, PA-C   100 mg at 06/09/17 [id number]    Lab Results:  Results for orders placed or performed during the hospital encounter of 06/07/17 (from the past 48 hour(s))  Hepatitis C antibody     Status: Abnormal   Collection Time: 06/08/17  6:37 AM  Result Value Ref Range   HCV Ab >11.0 (H) 0.0 - 0.9 s/co ratio     Comment: (NOTE)  Negative:     < 0.8                             Indeterminate: 0.8 - 0.9                                  Positive:     > 0.9 The CDC recommends that a positive HCV antibody result be followed up with a HCV Nucleic Acid Amplification test ([id number]). Performed At: [facility] [address] [place], [place] [id number] Mila Homer MD OZ:[id number] Performed at [facility], [address] [place], [place] [address]   Hepatitis B surface antigen     Status: None   Collection Time: 06/08/17  6:37 AM  Result Value Ref Range   Hepatitis B Surface Ag Negative Negative    Comment: (NOTE) Performed At: [facility] [address] [place], [place] [id number] Mila Homer MD RJ:[id number] Performed at [facility], [address], [place], [place] [address]   HIV antibody     Status: None   Collection Time: 06/08/17  6:37 AM  Result Value Ref Range   HIV Screen 4th Generation wRfx Non Reactive Non Reactive    Comment: (NOTE) Performed At: [facility] [address] [place], [place] [id number] Mila Homer MD UK:[id number] Performed at [facility], [address] [place], [place] [address]   Hepatic function panel     Status: Abnormal   Collection Time: 06/08/17  6:37 AM  Result Value Ref Range   Total Protein 7.1 6.5 - 8.1 g/dL   Albumin 3.8 3.5 - 5.0 g/dL   AST 151 (H) 15 - 41 U/L   ALT 400 (H) 17 - 63 U/L   Alkaline Phosphatase 104 38 - 126 U/L   Total Bilirubin 1.2 0.3 - 1.2 mg/dL   Bilirubin, Direct 0.2 0.1 - 0.5 mg/dL   Indirect Bilirubin 1.0 (H) 0.3 - 0.9 mg/dL    Comment: Performed at [facility], [address], [place], [place] [address]    Blood Alcohol level:  Lab Results  Component Value Date   ETH <10 06/06/2017   ETH <5 07/21/2015    Metabolic Disorder Labs: No results found for: HGBA1C,  MPG No results found for: PROLACTIN No results found for: CHOL, TRIG, HDL, CHOLHDL, VLDL, LDLCALC  Physical Findings: AIMS: Facial and Oral Movements Muscles of Facial Expression: None, normal Lips and Perioral Area: None, normal Jaw: None, normal Tongue: None, normal,Extremity Movements Upper (arms, wrists, hands, fingers): None, normal Lower (legs, knees, ankles, toes): None, normal, Trunk Movements Neck, shoulders, hips: None, normal, Overall Severity Severity of abnormal movements (highest score from questions above): None, normal Incapacitation due to abnormal movements: None, normal Patient's awareness of abnormal movements (rate only patient's report): No Awareness, Dental Status Current problems with teeth and/or dentures?: No Does patient usually wear dentures?: No  CIWA:  CIWA-Ar Total: 2 COWS:  COWS Total Score: 3  Musculoskeletal: Strength & Muscle Tone: within normal limits Gait & Station: normal Patient leans: N/A  Psychiatric Specialty Exam: Physical Exam  Nursing note and vitals reviewed. Constitutional: He is oriented to person, place, and time. He appears well-developed and well-nourished.  Cardiovascular: Normal rate.   Respiratory: Effort normal.  Musculoskeletal: Normal range of motion.  Neurological: He is alert and oriented to person, place,  and time.  Skin: Skin is warm.    Review of Systems  Constitutional: Negative.   HENT: Negative.   Eyes: Negative.   Respiratory: Negative.   Cardiovascular: Negative.   Gastrointestinal: Negative.   Genitourinary: Negative.   Musculoskeletal: Positive for back pain.  Skin: Negative.   Neurological: Negative.   Endo/Heme/Allergies: Negative.   Psychiatric/Behavioral: Positive for depression and hallucinations. Negative for suicidal ideas. The patient is nervous/anxious.     Blood pressure 115/72, pulse 81, temperature 98 F (36.7 C), temperature source Oral, resp. rate 20, height 6' (1.829 m), weight 120.2  kg (265 lb).Body mass index is 35.94 kg/m.  General Appearance: Casual  Eye Contact:  Good  Speech:  Clear and Coherent and Normal Rate  Volume:  Normal  Mood:  Depressed  Affect:  Flat  Thought Process:  Goal Directed and Descriptions of Associations: Intact  Orientation:  Full (Time, Place, and Person)  Thought Content:  WDL  Suicidal Thoughts:  No  Homicidal Thoughts:  No  Memory:  Immediate;   Good Recent;   Good Remote;   Good  Judgement:  Good  Insight:  Good  Psychomotor Activity:  Normal  Concentration:  Concentration: Good and Attention Span: Good  Recall:  Good  Fund of Knowledge:  Good  Language:  Good  Akathisia:  No  Handed:  Right  AIMS (if indicated):     Assets:  Communication Skills Desire for Improvement Financial Resources/Insurance Housing Physical Health Social Support Transportation  ADL's:  Intact  Cognition:  WNL  Sleep:  Number of Hours: 6.25     Treatment Plan Summary: Daily contact with patient to assess and evaluate symptoms and progress in treatment, Medication management and Plan is to:  -Continue Celexa 10 mg PO Daily for mood stability -Continue Ativan Detox Protocol -Continue Clonidine Detox Protocol -Increase Gabapentin 300 mg PO TID for withdrawal symptoms and neuropathic pain -Continue Seroquel 25 mg PO QHS  For mood stability -Encourage group therapy particiaption  Gerlene Burdock Money, FNP 06/09/2017, 3:05 PM   Agree with NP Progress Note

## 2017-06-09 NOTE — Progress Notes (Signed)
D:  Patient came to medication window this morning for meds and then returned to bed.  Patient denied SI and HI, contracts for safety.  Denied visual hallucinations.  Patient stated he "always hear voices".   A:  Medications administered per MD orders.  Emotional support and encouragement given patient. R:  Safety maintained with 15 minute checks.

## 2017-06-09 NOTE — Progress Notes (Signed)
Psychoeducational Group Note  Date:  06/09/2017 Time:  2045  Group Topic/Focus:  wrap up group  Participation Level: Did Not Attend  Participation Quality:  Not Applicable  Affect:  Not Applicable  Cognitive:  Not Applicable  Insight:  Not Applicable  Engagement in Group: Not Applicable  Additional Comments:  Pt was notified that group was beginning but returned to his room.   Marcille BuffyMcNeil, Recia Sons S 06/09/2017, 10:43 PM

## 2017-06-10 DIAGNOSIS — F1420 Cocaine dependence, uncomplicated: Secondary | ICD-10-CM

## 2017-06-10 DIAGNOSIS — F419 Anxiety disorder, unspecified: Secondary | ICD-10-CM

## 2017-06-10 DIAGNOSIS — R450 Nervousness: Secondary | ICD-10-CM

## 2017-06-10 NOTE — Progress Notes (Signed)
Data. Patient denies SI/HI/AVH. Verbally contracts for safety on the unit and to come to staff before acting of any self harm thoughts/feelings.  Patient interacting well with staff and other patients. Affect is flat and only brightens minimally with interaction. Patient reports he is craving opiates and alcohol. MD notified. On his self assessment patient reports 4/10 for depression, 2/10 for hopelessness and 9/10 for anxiety. His goal for today is, "Get better every day." Action. Emotional support and encouragement offered. Education provided on medication, indications and side effect. Q 15 minute checks done for safety. Response. Safety on the unit maintained through 15 minute checks.  Medications taken as prescribed. Remained calm and appropriate through out shift.

## 2017-06-10 NOTE — Progress Notes (Addendum)
[facility] MD Progress Note  06/10/2017 4:34 PM Scott [name]  MRN:  [id number]   Subjective:  Patient states he is feeling better. At this time he feels he is over the worse of the withdrawal symptoms and denies significant symptoms at present. Denies medication side effects. Reports improving mood and denies suicidal ideations .  He is future oriented, and is looking forward to discharge soon. States he plans to explore option of outpatient Suboxone management .   Objective: I have discussed case with treatment team and have met with patient. As per staff reports he is becoming more visible on unit, mor interactive, and expressing a desire for further improvement.  Denies medication side effects. Today reports improvement of withdrawal symptoms and does not appear to be in any acute discomfort or distress. Vitals are stable, tolerating PO well, no vomiting .  Today presents with improved mood and range of affect and as above, presents future oriented, stating he is interested in outpatient suboxone management following discharge. Plans to return home. Currently not interested in going to a residential rehab setting. No disruptive behaviors on unit. Presents calm , pleasant on approach.   Principal Problem: Schizoaffective disorder ([place]) Diagnosis:   Patient Active Problem List   Diagnosis Date Noted  . Schizoaffective disorder ([place]) [F25.9] 06/07/2017  . Alcohol use disorder, severe, dependence ([place]) [F10.20] 07/24/2015  . Alcohol withdrawal ([place]) [F10.239] 07/24/2015  . Cocaine use disorder, mild, abuse ([place]) [F14.10] 07/24/2015  . Substance induced mood disorder ([place]) [F19.94] 07/22/2015  . Drug-seeking behavior [Z76.5] 02/16/2015  . PTSD (post-traumatic stress disorder) [F43.10] 12/26/2014  . Opioid type dependence, continuous ([place]) [F11.20] 12/26/2014  . Major depressive disorder, recurrent episode, moderate (HCC) [F33.1]   . Chest pain at rest [R07.9] 10/22/2013  . Chronic pain syndrome  [G89.4] 10/22/2013  . Seizure disorder ([place]) [Y19.509] 10/22/2013  . Chest pain [R07.9] 10/21/2013   Total Time spent with patient: 20 minutes  Past Psychiatric History: See H&P  Past Medical History:  Past Medical History:  Diagnosis Date  . Accidental heroin overdose ([place])   . Anxiety   . Back pain   . Bipolar 1 disorder ([place])   . Chronic back pain   . Depression   . Drug-seeking behavior   . GERD (gastroesophageal reflux disease)   . Heroin abuse ([place])   . History of ETOH abuse   . Myocardial infarction ([place])   . Opioid dependence ([place])   . PTSD (post-traumatic stress disorder)   . Seizures ([place])     Past Surgical History:  Procedure Laterality Date  . ABDOMINAL SURGERY    . APPENDECTOMY    . BACK SURGERY     Family History:  Family History  Problem Relation Age of Onset  . Hypertension Mother   . CAD Father   . COPD Father   . Stroke Father   . Testicular cancer Brother    Family Psychiatric  History: See H&P Social History:  History  Alcohol Use  . Yes    Comment: a fifth and a half of whiskey per day     History  Drug Use  . Types: Heroin, Cocaine    Comment: heroin    Social History   Social History  . Marital status: Married    Spouse name: N/A  . Number of children: N/A  . Years of education: N/A   Social History Main Topics  . Smoking status: Current Every Day Smoker    Packs/day: 1.50    Types:  Cigarettes  . Smokeless tobacco: Never Used  . Alcohol use Yes     Comment: a fifth and a half of whiskey per day  . Drug use: Yes    Types: Heroin, Cocaine     Comment: heroin  . Sexual activity: Yes     Comment: heroin   Other Topics Concern  . None   Social History Narrative  . None   Additional Social History:    History of alcohol / drug use?: Yes Longest period of sobriety (when/how long): unknown Negative Consequences of Use: Financial  Sleep: Fair  Appetite:  improving   Current Medications: Current  Facility-Administered Medications  Medication Dose Route Frequency Provider Last Rate Last Dose  . acetaminophen (TYLENOL) tablet 650 mg  650 mg Oral Q6H PRN Laverle Hobby, PA-C   650 mg at 06/09/17 1701  . citalopram (CELEXA) tablet 10 mg  10 mg Oral BH-q7a Cobos, Myer Peer, MD   10 mg at 06/10/17 0609  . cloNIDine (CATAPRES) tablet 0.1 mg  0.1 mg Oral BH-qamhs Simon, Spencer E, PA-C   0.1 mg at 06/10/17 0750   Followed by  . [START ON 06/11/2017] cloNIDine (CATAPRES) tablet 0.1 mg  0.1 mg Oral QAC breakfast Laverle Hobby, PA-C      . dicyclomine (BENTYL) tablet 20 mg  20 mg Oral Q6H PRN Patriciaann Clan E, PA-C   20 mg at 06/09/17 2129  . gabapentin (NEURONTIN) capsule 300 mg  300 mg Oral TID Money, Lowry Ram, FNP   300 mg at 06/10/17 1158  . hydrOXYzine (ATARAX/VISTARIL) tablet 25 mg  25 mg Oral Q6H PRN Laverle Hobby, PA-C   25 mg at 06/10/17 2330  . loperamide (IMODIUM) capsule 2-4 mg  2-4 mg Oral PRN Patriciaann Clan E, PA-C   2 mg at 06/09/17 0762  . magnesium hydroxide (MILK OF MAGNESIA) suspension 30 mL  30 mL Oral Daily PRN Laverle Hobby, PA-C      . methocarbamol (ROBAXIN) tablet 500 mg  500 mg Oral Q8H PRN Laverle Hobby, PA-C   500 mg at 06/09/17 2129  . multivitamin with minerals tablet 1 tablet  1 tablet Oral Daily Laverle Hobby, PA-C   1 tablet at 06/10/17 0747  . nicotine (NICODERM CQ - dosed in mg/24 hours) patch 21 mg  21 mg Transdermal Daily Patriciaann Clan E, PA-C   21 mg at 06/10/17 0746  . ondansetron (ZOFRAN-ODT) disintegrating tablet 4 mg  4 mg Oral Q6H PRN Laverle Hobby, PA-C   4 mg at 06/09/17 1847  . QUEtiapine (SEROQUEL) tablet 25 mg  25 mg Oral QHS Cobos, Myer Peer, MD   25 mg at 06/09/17 2129  . thiamine (B-1) injection 100 mg  100 mg Intramuscular Once Patriciaann Clan E, PA-C      . thiamine (VITAMIN B-1) tablet 100 mg  100 mg Oral Daily Patriciaann Clan E, PA-C   100 mg at 06/10/17 2633    Lab Results:  No results found for this or any previous visit  (from the past 48 hour(s)).  Blood Alcohol level:  Lab Results  Component Value Date   ETH <10 06/06/2017   ETH <5 35/45/6256    Metabolic Disorder Labs: No results found for: HGBA1C, MPG No results found for: PROLACTIN No results found for: CHOL, TRIG, HDL, CHOLHDL, VLDL, LDLCALC  Physical Findings: AIMS: Facial and Oral Movements Muscles of Facial Expression: None, normal Lips and Perioral Area: None, normal Jaw: None, normal Tongue:  None, normal,Extremity Movements Upper (arms, wrists, hands, fingers): None, normal Lower (legs, knees, ankles, toes): None, normal, Trunk Movements Neck, shoulders, hips: None, normal, Overall Severity Severity of abnormal movements (highest score from questions above): None, normal Incapacitation due to abnormal movements: None, normal Patient's awareness of abnormal movements (rate only patient's report): No Awareness, Dental Status Current problems with teeth and/or dentures?: No Does patient usually wear dentures?: No  CIWA:  CIWA-Ar Total: 5 COWS:  COWS Total Score: 3  Musculoskeletal: Strength & Muscle Tone: within normal limits Gait & Station: normal Patient leans: N/A  Psychiatric Specialty Exam: Physical Exam  Nursing note and vitals reviewed. Constitutional: He is oriented to person, place, and time. He appears well-developed and well-nourished.  Cardiovascular: Normal rate.   Respiratory: Effort normal.  Musculoskeletal: Normal range of motion.  Neurological: He is alert and oriented to person, place, and time.  Skin: Skin is warm.    Review of Systems  Constitutional: Negative.   HENT: Negative.   Eyes: Negative.   Respiratory: Negative.   Cardiovascular: Negative.   Gastrointestinal: Negative.   Genitourinary: Negative.   Musculoskeletal: Positive for back pain.  Skin: Negative.   Neurological: Negative.   Endo/Heme/Allergies: Negative.   Psychiatric/Behavioral: Positive for depression and hallucinations. Negative  for suicidal ideas. The patient is nervous/anxious.   no chest pain, no dyspnea   Blood pressure 107/67, pulse 66, temperature 97.7 F (36.5 C), resp. rate 16, height 6' (1.829 m), weight 120.2 kg (265 lb).Body mass index is 35.94 kg/m.  General Appearance: improving grooming   Eye Contact:  Good  Speech:  Normal Rate  Volume:  Normal  Mood:  improving compared to admission, less depressed   Affect:  more reactive , smiles briefly at times   Thought Process:  Linear and Descriptions of Associations: Intact  Orientation:  Full (Time, Place, and Person)  Thought Content:  denies hallucinations, no delusions, not internally preoccupied   Suicidal Thoughts:  No- denies suicidal or self injurious ideations, denies homicidal or violent ideations   Homicidal Thoughts:  No  Memory:  recent and remote grossly intact   Judgement:  Other:  improving   Insight:  improving  Psychomotor Activity:  Normal  Concentration:  Concentration: Good and Attention Span: Good  Recall:  Good  Fund of Knowledge:  Good  Language:  Good  Akathisia:  No  Handed:  Right  AIMS (if indicated):     Assets:  Communication Skills Desire for Improvement Financial Resources/Insurance Housing Physical Health Social Support Transportation  ADL's:  Intact  Cognition:  WNL  Sleep:  Number of Hours: 5.25   Assessment - patient is presenting with improving mood and affect. Less dysphoric. Currently denies SI and is  future oriented, more focused on disposition planning- he is expressing interest in outpatient suboxone maintenance treatment following discharge. Withdrawal symptoms improved and currently presents calm and in no acute distress   Treatment Plan Summary: Daily contact with patient to assess and evaluate symptoms and progress in treatment, Medication management and Plan is to:  Treatment team reviewed as below today 11/2  Encourage group and milieu participation to work on coping skills and symptom  reduction Continue to encourage efforts and motivation to work on sobriety and relapse prevention Treatment team working on disposition planning as above  -Continue Celexa 10 mg QDAY for depression, anxiety -Completing  Ativan Detox Protocol to minimize risk of withdrawal -Completing Clonidine Detox Protocol to minimize risk of withdrawal -Continue Gabapentin 300 mg PO TID for  anxiety and pain -Continue Seroquel 25 mg PO QHS for insomnia and mood disorder -Recheck LFTs which have been elevated to insure that they are trending downward .   Jenne Campus, MD 06/10/2017, 4:34 PM   Patient ID: Scott [name], male   DOB: 04/01/1971, 46 y.o.   MRN: [id number]

## 2017-06-10 NOTE — Plan of Care (Signed)
Problem: Medication: Goal: Compliance with prescribed medication regimen will improve Outcome: Progressing Patient is training his medications as prescribed.  Problem: Activity: Goal: Interest or engagement in leisure activities will improve Outcome: Progressing Patient is participating in the unit milieu and some unit activities.

## 2017-06-10 NOTE — Progress Notes (Signed)
Nursing Progress Note: 7p-7a D: Pt currently presents with a anxious/pleasant affect and behavior. Pt states "I had a much better day today. I am not withdrawaling at all today. It's the first day I haven't felt sick." Interacting minimally with the milieu. Pt reports good sleep during the previous night with current medication regimen. Pt did not attend wrap-up group.  A: Pt provided with medications per providers orders. Pt's labs and vitals were monitored throughout the night. Pt supported emotionally and encouraged to express concerns and questions. Pt educated on medications.  R: Pt's safety ensured with 15 minute and environmental checks. Pt currently denies SI, HI, and AVH. Pt verbally contracts to seek staff if SI,HI, or AVH occurs and to consult with staff before acting on any harmful thoughts. Will continue to monitor.

## 2017-06-10 NOTE — Progress Notes (Addendum)
D: Pt was in the dayroom upon initial approach.  Pt presents with anxious, depressed affect and mood.  He reports he is "tired."  His goal is "to feel better and I'm starting to do that, feel more positive."  Pt denies SI/HI, denies hallucinations, reports back pain of 7/10, complains of withdrawal symptoms of anxiety and tremor.  Pt has been visible in milieu interacting with peers and staff appropriately.    A: Introduced self to pt.  Actively listened to pt and offered support and encouragement. Medications administered per order.  PRN medication administered for abdominal cramps, anxiety, and pain from muscle spasms.  Q15 minute safety checks maintained.  PO fluids encouraged and provided.  R: Pt is safe on the unit.  Pt is compliant with medications.  Pt verbally contracts for safety.  Will continue to monitor and assess.

## 2017-06-10 NOTE — Progress Notes (Signed)
  [facility] :  Will you be returning to the same living situation after discharge:  Yes,  home At discharge, do you have transportation home?: Yes,  wife/family member will pick him up either Saturday or Sunday-Dr. Cobos will determine which day patient can discharge. Do you have the ability to pay for your medications: Yes,  [employer]  Release of information consent forms completed and submitted to medical records by CSW.  Patient to Follow up at: Follow-up Information    [facility].   Why:  Walk in within 3 days of hospital discharge to be assessed for outpatient services including medication management and counseling. Walk in hours: Monday-Friday 8am-3pm. Please bring: photo ID, social security card, proof of income, and insurance card. Contact information: [address] [place], [place] [address] Phone: [phone] Fax: [phone]          Next level of care provider [facility]:no  Safety Planning and Suicide Prevention discussed: Yes,  SPE completed with pt; pt declined to consent to family contact. SPI pamphlet and Mobile Crisis information provided.  Have you used any form of tobacco in the last 30 days? (Cigarettes, Smokeless Tobacco, Cigars, and/or Pipes): Yes  Has patient been referred to the Quitline?: Patient refused referral  Patient has been referred for addiction treatment: Yes  Pulte HomesHeather N Smart, LCSW 06/10/2017, 3:52 PM

## 2017-06-10 NOTE — [facility] Suicide Risk Assessment (Signed)
[facility] INPATIENT:  Family/Significant Other Suicide Prevention Education  Suicide Prevention Education:  Patient Refusal for Family/Significant Other Suicide Prevention Education: The patient Scott [name] has refused to provide written consent for family/significant other to be provided Family/Significant Other Suicide Prevention Education during admission and/or prior to discharge.  Physician notified.  SPE completed with pt, as pt refused to consent to family contact. SPI pamphlet provided to pt and pt was encouraged to share information with support network, ask questions, and talk about any concerns relating to SPE. Pt denies access to guns/firearms and verbalized understanding of information provided. Mobile Crisis information also provided to pt.    Jenica Costilow N Smart 06/10/2017, 3:52 PM

## 2017-06-10 NOTE — Plan of Care (Signed)
Problem: Safety: Goal: Periods of time without injury will increase Outcome: Progressing Pt has not harmed self or others tonight.  He denies SI/HI and verbally contracts for safety.   

## 2017-06-10 NOTE — [facility] Group Notes (Signed)
LCSW Group Therapy Note  06/10/2017 1:15pm  Type of Therapy and Topic:  Group Therapy:  Feelings around Relapse and Recovery  Participation Level:  Did Not Attend -pt invited. Chose to remain in bed.   Description of Group:    Patients in this group will discuss emotions they experience before and after a relapse. They will process how experiencing these feelings, or avoidance of experiencing them, relates to having a relapse. Facilitator will guide patients to explore emotions they have related to recovery. Patients will be encouraged to process which emotions are more powerful. They will be guided to discuss the emotional reaction significant others in their lives may have to their relapse or recovery. Patients will be assisted in exploring ways to respond to the emotions of others without this contributing to a relapse.  Therapeutic Goals: 1. Patient will identify two or more emotions that lead to a relapse for them 2. Patient will identify two emotions that result when they relapse 3. Patient will identify two emotions related to recovery 4. Patient will demonstrate ability to communicate their needs through discussion and/or role plays   Summary of Patient Progress:     Therapeutic Modalities:   Cognitive Behavioral Therapy Solution-Focused Therapy Assertiveness Training Relapse Prevention Therapy   Ledell PeoplesHeather N Smart, LCSW 06/10/2017 3:15 PM

## 2017-06-11 ENCOUNTER — Encounter (HOSPITAL_COMMUNITY): Payer: Self-pay | Admitting: Behavioral Health

## 2017-06-11 LAB — HEPATIC FUNCTION PANEL
ALT: 323 U/L — ABNORMAL HIGH (ref 17–63)
AST: 176 U/L — ABNORMAL HIGH (ref 15–41)
Albumin: 3.5 g/dL (ref 3.5–5.0)
Alkaline Phosphatase: 88 U/L (ref 38–126)
Bilirubin, Direct: 0.4 mg/dL (ref 0.1–0.5)
Indirect Bilirubin: 1.1 mg/dL — ABNORMAL HIGH (ref 0.3–0.9)
Total Bilirubin: 1.5 mg/dL — ABNORMAL HIGH (ref 0.3–1.2)
Total Protein: 6.6 g/dL (ref 6.5–8.1)

## 2017-06-11 MED ORDER — GABAPENTIN 300 MG PO CAPS: 300.0000 mg | ORAL_CAPSULE | Freq: Three times a day (TID) | ORAL | 0 refills | Status: DC

## 2017-06-11 MED ORDER — CITALOPRAM HYDROBROMIDE 10 MG PO TABS: 10.0000 mg | ORAL_TABLET | ORAL | 0 refills | Status: DC

## 2017-06-11 MED ORDER — NICOTINE 21 MG/24HR TD PT24: 21.0000 mg | MEDICATED_PATCH | Freq: Every day | TRANSDERMAL | 0 refills | Status: DC

## 2017-06-11 MED ORDER — QUETIAPINE FUMARATE 25 MG PO TABS: 25.0000 mg | ORAL_TABLET | Freq: Every day | ORAL | 0 refills | Status: DC

## 2017-06-11 MED ORDER — HYDROXYZINE HCL 25 MG PO TABS: 25.0000 mg | ORAL_TABLET | Freq: Four times a day (QID) | ORAL | 0 refills | Status: DC | PRN

## 2017-06-11 NOTE — [facility] Group Notes (Signed)
[facility] LCSW Group Therapy Note  06/11/2017 at  10:20 to 11:15 AM  Type of Therapy and Topic:  Group Therapy: Avoiding Self-Sabotaging and Enabling Behaviors  Participation Level:  Active   Description of Group The main focus of today's process group to discuss what "self-sabotage" means and use motivational iInterviewing to discuss what benefits, negative or positive, were involved in a self-identified self-sabotaging behavior. We then talked about reasons the patient may want to change the behavior and their current desire to change.   Summary of Patient Progress: Patient engaged easily in group discussion yet had tendency to talk over others and monopolize discussion with repeated rationalizations for his active addictions. Patient processed his tendency to people please and overdo for others and did not like appreciate redirection or challenging concepts.     Therapeutic molalities: Cognitive Behavioral Therapy Person-Centered Therapy Motivational Interviewing  Therapeutic Goals: 1. Patients will demonstrate understanding of the concept of self sabotage 2. Patients will be able to identify pros and cons of their behaviors 3. Patients will be able to identify at least two motivating factors for l of their desire for change   Carney Bernatherine C Elgar Scoggins, LCSW
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
## 2017-06-11 NOTE — [facility] Suicide Risk Assessment (Addendum)
[facility]   Principal Problem: Schizoaffective disorder [facility](HCC) Discharge Diagnoses:  Patient Active Problem List   Diagnosis Date Noted  . Schizoaffective disorder (HCC) [F25.9] 06/07/2017  . Alcohol use disorder, severe, dependence (HCC) [F10.20] 07/24/2015  . Alcohol withdrawal (HCC) [F10.239] 07/24/2015  . Cocaine use disorder, mild, abuse (HCC) [F14.10] 07/24/2015  . Substance induced mood disorder (HCC) [F19.94] 07/22/2015  . Drug-seeking behavior [Z76.5] 02/16/2015  . PTSD (post-traumatic stress disorder) [F43.10] 12/26/2014  . Opioid type dependence, continuous (HCC) [F11.20] 12/26/2014  . Major depressive disorder, recurrent episode, moderate (HCC) [F33.1]   . Chest pain at rest [R07.9] 10/22/2013  . Chronic pain syndrome [G89.4] 10/22/2013  . Seizure disorder (HCC) [G40.909] 10/22/2013  . Chest pain [R07.9] 10/21/2013    Total Time spent with patient: 30 minutes  Musculoskeletal: Strength & Muscle Tone: within normal limits Gait & Station: normal Patient leans: N/A  Psychiatric Specialty Exam: ROS denies headache, no nausea, no vomiting, no fever  Blood pressure 116/72, pulse 64, temperature 98.2 F (36.8 C), temperature source Oral, resp. rate 18, height 6' (1.829 m), weight 120.2 kg (265 lb), SpO2 99 %.Body mass index is 35.94 kg/m.  General Appearance: Well Groomed  Eye Contact::  Good  Speech:  Normal Rate409  Volume:  Normal  Mood:  improved   Affect:  Appropriate and Full Range  Thought Process:  Linear and Descriptions of Associations: Intact  Orientation:  Full (Time, Place, and Person)  Thought Content:  denies hallucinations, no delusions, not internally preoccupied  Suicidal Thoughts:  No denies suicidal ideations , denies self injurious ideations, no homicidal or violent ideations  Homicidal Thoughts:  No  Memory:  recent and remote grossly intact   Judgement:  Other:  improving   Insight:  improving   Psychomotor  Activity:  Normal  Concentration:  Good  Recall:  Good  Fund of Knowledge:Good  Language: Good  Akathisia:  Negative  Handed:  Right  AIMS (if indicated):     Assets:  Communication Skills Desire for Improvement Social Support  Sleep:  Number of Hours: 6.75  Cognition: WNL  ADL's:  Intact   Mental Status Per Nursing Assessment::   On Admission:  Suicidal ideation indicated by patient  Demographic Factors:  46 year old married male, has three children, on disability  Loss Factors: Substance abuse , disability   Historical Factors: History of Mood Disorder, history of Substance Dependence, identifies opiates and alcohol as substances of choice   Risk Reduction Factors:   Responsible for children under 45 years of age, Sense of responsibility to family, Living with another person, especially a relative and Positive coping skills or problem solving skills  Continued Clinical Symptoms:  Patient reports that he is no longer having symptoms of WDL, and presents in no acute distress or discomfort. States he is feeling better, and feels his mood is improved, reports improved energy level. Denies suicidal ideations, denies homicidal ideations, no psychotic symptoms. Denies medication side effects Presents pleasant , cooperative on approach  We discussed elevated LFTs and Hep C (+) status . Patient aware and interested in following up with PCP for referral to obtain appropriate treatment.  Cognitive Features That Contribute To Risk:  No gross cognitive deficits noted upon discharge. Is alert , attentive, and oriented x 3   Suicide Risk:  Mild:  Suicidal ideation of limited frequency, intensity, duration, and specificity.  There are no identifiable plans, no associated intent, mild dysphoria and related symptoms, good self-control (both objective  and subjective assessment), few other risk factors, and identifiable protective factors, including available and accessible social  support.  Follow-up Information    [facility].   Why:  Walk in within 3 days of hospital discharge to be assessed for outpatient services including medication management and counseling. Walk in hours: Monday-Friday 8am-3pm. Please bring: photo ID, social security card, proof of income, and insurance card. Contact information: [address] [place], [place] [address] Phone: [phone] Fax: [phone]          Plan Of Care/Follow-up recommendations:  Activity:  as tolerated  Diet:  Regular Tests:  NA Other:  See below  Patient is expressing readiness for discharge and is leaving unit in good spirits  Plans to return home Plans to follow up at [facility] and states he is considering going on Suboxone maintenance thought [facility]  States he is planning on going to [facility] in [facility]  Craige Cotta, MD 06/11/2017, 11:40 AM

## 2017-06-11 NOTE — Progress Notes (Addendum)
Pt A & O X4. Denies SI, HI, AVH and pain when assessed "I just feel my regular everyday back pain, I can deal with it for now" Rates his depression 0/10, hopelessness 0/10 and anxiety 8/10. Pt d/c home as per MD's orders. Picked up in the lobby by his wife.  A: Emotional support and availability provided to pt. D/C instructions reviewed with pt including prescriptions and  follow up appointment. Pt encouraged to comply with instructions. Pt had no belongings in locker at time of d/c. Q 15 minutes safety checks continues till time of d/c without self harm gestures or outburst to note thus far.  R: Pt receptive to care. Compliant with medications when offered. Denies adverse drug reactions at this time. Pt verbalized understanding related to d/c instructions. Pt signed belonging sheet in agreement that he had no belongings in locker at time of admission.  Observed in dayroom engaged with peers. Ambulatory with a steady gait. Appears to be in no physical distress at time of d/c.

## 2017-08-24 ENCOUNTER — Emergency Department (HOSPITAL_COMMUNITY)
Admission: EM | Admit: 2017-08-24 | Discharge: 2017-08-24 | Disposition: A | Discharge status: Home / Self Care | Payer: PPO | Attending: Emergency Medicine | Admitting: Emergency Medicine

## 2017-08-24 DIAGNOSIS — Z046 Encounter for general psychiatric examination, requested by authority: Secondary | ICD-10-CM | POA: Insufficient documentation

## 2017-08-24 DIAGNOSIS — Z5321 Procedure and treatment not carried out due to patient leaving prior to being seen by health care provider: Secondary | ICD-10-CM | POA: Diagnosis not present

## 2017-08-25 ENCOUNTER — Encounter (HOSPITAL_COMMUNITY): Payer: Self-pay | Admitting: Emergency Medicine

## 2017-08-25 ENCOUNTER — Other Ambulatory Visit: Payer: Self-pay

## 2017-08-25 ENCOUNTER — Emergency Department (HOSPITAL_COMMUNITY)
Admission: EM | Admit: 2017-08-25 | Discharge: 2017-08-25 | Disposition: A | Discharge status: Home / Self Care | Payer: PPO | Attending: Emergency Medicine | Admitting: Emergency Medicine

## 2017-08-25 DIAGNOSIS — Z5321 Procedure and treatment not carried out due to patient leaving prior to being seen by health care provider: Secondary | ICD-10-CM | POA: Insufficient documentation

## 2017-08-25 DIAGNOSIS — F19939 Other psychoactive substance use, unspecified with withdrawal, unspecified: Secondary | ICD-10-CM | POA: Diagnosis not present

## 2017-08-25 LAB — COMPREHENSIVE METABOLIC PANEL
ALT: 205 U/L — ABNORMAL HIGH (ref 17–63)
AST: 120 U/L — ABNORMAL HIGH (ref 15–41)
Albumin: 3.4 g/dL — ABNORMAL LOW (ref 3.5–5.0)
Alkaline Phosphatase: 83 U/L (ref 38–126)
Anion gap: 9 (ref 5–15)
BUN: 11 mg/dL (ref 6–20)
CO2: 24 mmol/L (ref 22–32)
Calcium: 8.8 mg/dL — ABNORMAL LOW (ref 8.9–10.3)
Chloride: 107 mmol/L (ref 101–111)
Creatinine, Ser: 0.73 mg/dL (ref 0.61–1.24)
GFR calc Af Amer: >60 mL/min (ref >60)
GFR calc non Af Amer: >60 mL/min (ref >60)
Glucose, Bld: 135 mg/dL — ABNORMAL HIGH (ref 65–99)
Potassium: 3.9 mmol/L (ref 3.5–5.1)
Sodium: 140 mmol/L (ref 135–145)
Total Bilirubin: 0.6 mg/dL (ref 0.3–1.2)
Total Protein: 6.2 g/dL — ABNORMAL LOW (ref 6.5–8.1)

## 2017-08-25 LAB — CBC
HCT: 43.5 % (ref 39.0–52.0)
Hemoglobin: 15.2 g/dL (ref 13.0–17.0)
MCH: 31.8 pg (ref 26.0–34.0)
MCHC: 34.9 g/dL (ref 30.0–36.0)
MCV: 91 fL (ref 78.0–100.0)
Platelets: 177 10*3/uL (ref 150–400)
RBC: 4.78 MIL/uL (ref 4.22–5.81)
RDW: 13.8 % (ref 11.5–15.5)
WBC: 4.9 10*3/uL (ref 4.0–10.5)

## 2017-08-25 LAB — ETHANOL: Alcohol, Ethyl (B): <10 mg/dL (ref <10)

## 2017-08-25 NOTE — ED Notes (Signed)
Writer called for urine sample, no response.

## 2017-08-25 NOTE — ED Notes (Signed)
Lab work, radiology results and vital signs reviewed, no critical results at this time, no change in acuity indicated.  

## 2017-08-25 NOTE — ED Triage Notes (Signed)
Pt reports relapsing, reports using cocaine, heroin and meth in last 12 hours. Pt denies CP, SOB.  Pt states, "I need to be here for my safety." Pt denies SI at this time.

## 2017-08-25 NOTE — ED Notes (Signed)
Called pt, no answer.

## 2017-08-25 NOTE — ED Notes (Signed)
Called Pt to reassess. No answer

## 2017-08-26 DIAGNOSIS — Z885 Allergy status to narcotic agent status: Secondary | ICD-10-CM | POA: Diagnosis not present

## 2017-08-26 DIAGNOSIS — F1910 Other psychoactive substance abuse, uncomplicated: Secondary | ICD-10-CM | POA: Diagnosis not present

## 2017-08-26 DIAGNOSIS — F1220 Cannabis dependence, uncomplicated: Secondary | ICD-10-CM | POA: Diagnosis not present

## 2017-08-26 DIAGNOSIS — F10120 Alcohol abuse with intoxication, uncomplicated: Secondary | ICD-10-CM | POA: Diagnosis not present

## 2017-08-26 DIAGNOSIS — R7989 Other specified abnormal findings of blood chemistry: Secondary | ICD-10-CM | POA: Diagnosis not present

## 2017-08-26 DIAGNOSIS — Z87892 Personal history of anaphylaxis: Secondary | ICD-10-CM | POA: Diagnosis not present

## 2017-08-26 DIAGNOSIS — I10 Essential (primary) hypertension: Secondary | ICD-10-CM | POA: Diagnosis not present

## 2017-08-26 DIAGNOSIS — F329 Major depressive disorder, single episode, unspecified: Secondary | ICD-10-CM | POA: Diagnosis not present

## 2017-08-26 DIAGNOSIS — Z91040 Latex allergy status: Secondary | ICD-10-CM | POA: Diagnosis not present

## 2017-08-26 DIAGNOSIS — F1420 Cocaine dependence, uncomplicated: Secondary | ICD-10-CM | POA: Diagnosis not present

## 2017-08-26 DIAGNOSIS — F419 Anxiety disorder, unspecified: Secondary | ICD-10-CM | POA: Diagnosis not present

## 2017-08-26 DIAGNOSIS — R748 Abnormal levels of other serum enzymes: Secondary | ICD-10-CM | POA: Diagnosis not present

## 2017-08-26 DIAGNOSIS — G40909 Epilepsy, unspecified, not intractable, without status epilepticus: Secondary | ICD-10-CM | POA: Diagnosis not present

## 2017-08-26 DIAGNOSIS — F1020 Alcohol dependence, uncomplicated: Secondary | ICD-10-CM | POA: Diagnosis not present

## 2017-08-26 DIAGNOSIS — F1120 Opioid dependence, uncomplicated: Secondary | ICD-10-CM | POA: Diagnosis not present

## 2017-08-26 DIAGNOSIS — F17200 Nicotine dependence, unspecified, uncomplicated: Secondary | ICD-10-CM | POA: Diagnosis not present

## 2017-08-26 DIAGNOSIS — J449 Chronic obstructive pulmonary disease, unspecified: Secondary | ICD-10-CM | POA: Diagnosis not present

## 2017-08-26 DIAGNOSIS — I252 Old myocardial infarction: Secondary | ICD-10-CM | POA: Diagnosis not present

## 2017-08-26 DIAGNOSIS — N3289 Other specified disorders of bladder: Secondary | ICD-10-CM | POA: Diagnosis not present

## 2017-08-26 DIAGNOSIS — Z79899 Other long term (current) drug therapy: Secondary | ICD-10-CM | POA: Diagnosis not present

## 2017-08-26 DIAGNOSIS — K5730 Diverticulosis of large intestine without perforation or abscess without bleeding: Secondary | ICD-10-CM | POA: Diagnosis not present

## 2017-08-26 DIAGNOSIS — Z888 Allergy status to other drugs, medicaments and biological substances status: Secondary | ICD-10-CM | POA: Diagnosis not present

## 2017-08-31 ENCOUNTER — Encounter (HOSPITAL_COMMUNITY): Payer: Self-pay | Admitting: Emergency Medicine

## 2017-08-31 ENCOUNTER — Emergency Department (HOSPITAL_COMMUNITY)
Admission: EM | Admit: 2017-08-31 | Discharge: 2017-08-31 | Disposition: A | Discharge status: Home / Self Care | Payer: PPO | Attending: Emergency Medicine | Admitting: Emergency Medicine

## 2017-08-31 DIAGNOSIS — R079 Chest pain, unspecified: Secondary | ICD-10-CM | POA: Diagnosis not present

## 2017-08-31 DIAGNOSIS — Z5321 Procedure and treatment not carried out due to patient leaving prior to being seen by health care provider: Secondary | ICD-10-CM | POA: Insufficient documentation

## 2017-08-31 DIAGNOSIS — F1120 Opioid dependence, uncomplicated: Secondary | ICD-10-CM | POA: Diagnosis not present

## 2017-08-31 LAB — CBC
HCT: 45 % (ref 39.0–52.0)
Hemoglobin: 15.7 g/dL (ref 13.0–17.0)
MCH: 31.5 pg (ref 26.0–34.0)
MCHC: 34.9 g/dL (ref 30.0–36.0)
MCV: 90.4 fL (ref 78.0–100.0)
Platelets: 203 10*3/uL (ref 150–400)
RBC: 4.98 MIL/uL (ref 4.22–5.81)
RDW: 13.3 % (ref 11.5–15.5)
WBC: 7 10*3/uL (ref 4.0–10.5)

## 2017-08-31 LAB — COMPREHENSIVE METABOLIC PANEL
ALT: 256 U/L — ABNORMAL HIGH (ref 17–63)
AST: 141 U/L — ABNORMAL HIGH (ref 15–41)
Albumin: 3.7 g/dL (ref 3.5–5.0)
Alkaline Phosphatase: 90 U/L (ref 38–126)
Anion gap: 11 (ref 5–15)
BUN: 10 mg/dL (ref 6–20)
CO2: 27 mmol/L (ref 22–32)
Calcium: 8.8 mg/dL — ABNORMAL LOW (ref 8.9–10.3)
Chloride: 105 mmol/L (ref 101–111)
Creatinine, Ser: 1 mg/dL (ref 0.61–1.24)
GFR calc Af Amer: >60 mL/min (ref >60)
GFR calc non Af Amer: >60 mL/min (ref >60)
Glucose, Bld: 109 mg/dL — ABNORMAL HIGH (ref 65–99)
Potassium: 4.2 mmol/L (ref 3.5–5.1)
Sodium: 143 mmol/L (ref 135–145)
Total Bilirubin: 1.1 mg/dL (ref 0.3–1.2)
Total Protein: 6.3 g/dL — ABNORMAL LOW (ref 6.5–8.1)

## 2017-08-31 LAB — ETHANOL: Alcohol, Ethyl (B): <10 mg/dL (ref <10)

## 2017-08-31 NOTE — ED Triage Notes (Signed)
Pt to ER for assistance with heroin detox states last use was last night. No ETOH in one month. Denies any symptoms of withdrawal at this time.

## 2017-08-31 NOTE — ED Notes (Signed)
Pt did not answer when called for vitals re-check.

## 2017-08-31 NOTE — ED Notes (Signed)
Pt denies SI/HI. States had some minor chest pains this morning, no pain at this time, hx of MI.

## 2017-08-31 NOTE — ED Notes (Signed)
Per Anell BarrKaren Cobb, RN she believed pt has already left.  No answer when called.

## 2017-08-31 NOTE — ED Notes (Signed)
No answer in waiting area x 3  

## 2017-09-05 DIAGNOSIS — F1120 Opioid dependence, uncomplicated: Secondary | ICD-10-CM | POA: Diagnosis not present

## 2017-09-16 DIAGNOSIS — F1120 Opioid dependence, uncomplicated: Secondary | ICD-10-CM | POA: Diagnosis not present

## 2017-09-27 DIAGNOSIS — F1120 Opioid dependence, uncomplicated: Secondary | ICD-10-CM | POA: Diagnosis not present

## 2017-10-06 DIAGNOSIS — F1120 Opioid dependence, uncomplicated: Secondary | ICD-10-CM | POA: Diagnosis not present

## 2017-10-17 DIAGNOSIS — F1120 Opioid dependence, uncomplicated: Secondary | ICD-10-CM | POA: Diagnosis not present

## 2017-10-25 DIAGNOSIS — F1120 Opioid dependence, uncomplicated: Secondary | ICD-10-CM | POA: Diagnosis not present

## 2017-11-04 DIAGNOSIS — F1120 Opioid dependence, uncomplicated: Secondary | ICD-10-CM | POA: Diagnosis not present

## 2017-11-24 DIAGNOSIS — F1120 Opioid dependence, uncomplicated: Secondary | ICD-10-CM | POA: Diagnosis not present

## 2017-11-27 DIAGNOSIS — M79641 Pain in right hand: Secondary | ICD-10-CM | POA: Diagnosis not present

## 2017-11-27 DIAGNOSIS — M25431 Effusion, right wrist: Secondary | ICD-10-CM | POA: Diagnosis not present

## 2017-11-27 DIAGNOSIS — M25531 Pain in right wrist: Secondary | ICD-10-CM | POA: Diagnosis not present

## 2017-11-27 DIAGNOSIS — S6991XA Unspecified injury of right wrist, hand and finger(s), initial encounter: Secondary | ICD-10-CM | POA: Diagnosis not present

## 2017-11-27 DIAGNOSIS — S63501A Unspecified sprain of right wrist, initial encounter: Secondary | ICD-10-CM | POA: Diagnosis not present

## 2017-11-27 DIAGNOSIS — W010XXA Fall on same level from slipping, tripping and stumbling without subsequent striking against object, initial encounter: Secondary | ICD-10-CM | POA: Diagnosis not present

## 2017-12-01 DIAGNOSIS — F1120 Opioid dependence, uncomplicated: Secondary | ICD-10-CM | POA: Diagnosis not present

## 2017-12-08 DIAGNOSIS — F1120 Opioid dependence, uncomplicated: Secondary | ICD-10-CM | POA: Diagnosis not present

## 2017-12-11 DIAGNOSIS — R509 Fever, unspecified: Secondary | ICD-10-CM | POA: Diagnosis not present

## 2017-12-11 DIAGNOSIS — R0989 Other specified symptoms and signs involving the circulatory and respiratory systems: Secondary | ICD-10-CM | POA: Diagnosis not present

## 2017-12-11 DIAGNOSIS — R05 Cough: Secondary | ICD-10-CM | POA: Diagnosis not present

## 2017-12-11 DIAGNOSIS — R0781 Pleurodynia: Secondary | ICD-10-CM | POA: Diagnosis not present

## 2017-12-12 DIAGNOSIS — R05 Cough: Secondary | ICD-10-CM | POA: Diagnosis not present

## 2017-12-15 DIAGNOSIS — F1120 Opioid dependence, uncomplicated: Secondary | ICD-10-CM | POA: Diagnosis not present

## 2017-12-16 ENCOUNTER — Emergency Department (HOSPITAL_COMMUNITY)
Admission: EM | Admit: 2017-12-16 | Discharge: 2017-12-16 | Disposition: A | Discharge status: Other Acute Inpatient Hospital | Payer: PPO | Attending: Emergency Medicine | Admitting: Emergency Medicine

## 2017-12-16 ENCOUNTER — Other Ambulatory Visit: Payer: Self-pay

## 2017-12-16 ENCOUNTER — Inpatient Hospital Stay (HOSPITAL_COMMUNITY)
Admission: AD | Admit: 2017-12-16 | Discharge: 2017-12-23 | DRG: 885 | Disposition: A | Discharge status: Home / Self Care | Payer: PPO | Source: Intra-hospital | Attending: Psychiatry | Admitting: Psychiatry

## 2017-12-16 ENCOUNTER — Encounter (HOSPITAL_COMMUNITY): Payer: Self-pay | Admitting: *Deleted

## 2017-12-16 ENCOUNTER — Emergency Department (HOSPITAL_COMMUNITY): Payer: PPO

## 2017-12-16 ENCOUNTER — Encounter (HOSPITAL_COMMUNITY): Payer: Self-pay | Admitting: Emergency Medicine

## 2017-12-16 DIAGNOSIS — Z825 Family history of asthma and other chronic lower respiratory diseases: Secondary | ICD-10-CM | POA: Diagnosis not present

## 2017-12-16 DIAGNOSIS — F17210 Nicotine dependence, cigarettes, uncomplicated: Secondary | ICD-10-CM | POA: Diagnosis present

## 2017-12-16 DIAGNOSIS — Z79899 Other long term (current) drug therapy: Secondary | ICD-10-CM | POA: Insufficient documentation

## 2017-12-16 DIAGNOSIS — F332 Major depressive disorder, recurrent severe without psychotic features: Principal | ICD-10-CM | POA: Diagnosis present

## 2017-12-16 DIAGNOSIS — F259 Schizoaffective disorder, unspecified: Secondary | ICD-10-CM | POA: Diagnosis not present

## 2017-12-16 DIAGNOSIS — F329 Major depressive disorder, single episode, unspecified: Secondary | ICD-10-CM | POA: Insufficient documentation

## 2017-12-16 DIAGNOSIS — F1190 Opioid use, unspecified, uncomplicated: Secondary | ICD-10-CM | POA: Diagnosis present

## 2017-12-16 DIAGNOSIS — K219 Gastro-esophageal reflux disease without esophagitis: Secondary | ICD-10-CM | POA: Diagnosis not present

## 2017-12-16 DIAGNOSIS — R748 Abnormal levels of other serum enzymes: Secondary | ICD-10-CM | POA: Diagnosis present

## 2017-12-16 DIAGNOSIS — F1290 Cannabis use, unspecified, uncomplicated: Secondary | ICD-10-CM | POA: Diagnosis not present

## 2017-12-16 DIAGNOSIS — F515 Nightmare disorder: Secondary | ICD-10-CM | POA: Diagnosis not present

## 2017-12-16 DIAGNOSIS — Z8249 Family history of ischemic heart disease and other diseases of the circulatory system: Secondary | ICD-10-CM | POA: Diagnosis not present

## 2017-12-16 DIAGNOSIS — Z8043 Family history of malignant neoplasm of testis: Secondary | ICD-10-CM | POA: Diagnosis not present

## 2017-12-16 DIAGNOSIS — G894 Chronic pain syndrome: Secondary | ICD-10-CM | POA: Diagnosis not present

## 2017-12-16 DIAGNOSIS — F410 Panic disorder [episodic paroxysmal anxiety] without agoraphobia: Secondary | ICD-10-CM | POA: Diagnosis not present

## 2017-12-16 DIAGNOSIS — F322 Major depressive disorder, single episode, severe without psychotic features: Secondary | ICD-10-CM | POA: Diagnosis not present

## 2017-12-16 DIAGNOSIS — R45851 Suicidal ideations: Secondary | ICD-10-CM | POA: Diagnosis present

## 2017-12-16 DIAGNOSIS — Z818 Family history of other mental and behavioral disorders: Secondary | ICD-10-CM | POA: Diagnosis not present

## 2017-12-16 DIAGNOSIS — G479 Sleep disorder, unspecified: Secondary | ICD-10-CM | POA: Diagnosis not present

## 2017-12-16 DIAGNOSIS — Z823 Family history of stroke: Secondary | ICD-10-CM

## 2017-12-16 DIAGNOSIS — F1590 Other stimulant use, unspecified, uncomplicated: Secondary | ICD-10-CM | POA: Diagnosis not present

## 2017-12-16 DIAGNOSIS — F1410 Cocaine abuse, uncomplicated: Secondary | ICD-10-CM | POA: Diagnosis present

## 2017-12-16 DIAGNOSIS — R05 Cough: Secondary | ICD-10-CM | POA: Diagnosis present

## 2017-12-16 DIAGNOSIS — F4310 Post-traumatic stress disorder, unspecified: Secondary | ICD-10-CM | POA: Diagnosis not present

## 2017-12-16 DIAGNOSIS — Z91040 Latex allergy status: Secondary | ICD-10-CM

## 2017-12-16 DIAGNOSIS — Z630 Problems in relationship with spouse or partner: Secondary | ICD-10-CM | POA: Diagnosis not present

## 2017-12-16 DIAGNOSIS — Z91411 Personal history of adult psychological abuse: Secondary | ICD-10-CM | POA: Diagnosis not present

## 2017-12-16 DIAGNOSIS — F1099 Alcohol use, unspecified with unspecified alcohol-induced disorder: Secondary | ICD-10-CM | POA: Diagnosis not present

## 2017-12-16 DIAGNOSIS — Z888 Allergy status to other drugs, medicaments and biological substances status: Secondary | ICD-10-CM | POA: Diagnosis not present

## 2017-12-16 DIAGNOSIS — I252 Old myocardial infarction: Secondary | ICD-10-CM | POA: Diagnosis not present

## 2017-12-16 DIAGNOSIS — Z23 Encounter for immunization: Secondary | ICD-10-CM

## 2017-12-16 DIAGNOSIS — M549 Dorsalgia, unspecified: Secondary | ICD-10-CM | POA: Diagnosis present

## 2017-12-16 DIAGNOSIS — F419 Anxiety disorder, unspecified: Secondary | ICD-10-CM | POA: Diagnosis not present

## 2017-12-16 DIAGNOSIS — Y900 Blood alcohol level of less than 20 mg/100 ml: Secondary | ICD-10-CM | POA: Diagnosis not present

## 2017-12-16 DIAGNOSIS — R450 Nervousness: Secondary | ICD-10-CM | POA: Diagnosis not present

## 2017-12-16 DIAGNOSIS — F1120 Opioid dependence, uncomplicated: Secondary | ICD-10-CM | POA: Diagnosis not present

## 2017-12-16 DIAGNOSIS — F1490 Cocaine use, unspecified, uncomplicated: Secondary | ICD-10-CM | POA: Diagnosis not present

## 2017-12-16 DIAGNOSIS — G40909 Epilepsy, unspecified, not intractable, without status epilepticus: Secondary | ICD-10-CM | POA: Diagnosis present

## 2017-12-16 DIAGNOSIS — J069 Acute upper respiratory infection, unspecified: Secondary | ICD-10-CM | POA: Diagnosis not present

## 2017-12-16 LAB — CBC WITH DIFFERENTIAL/PLATELET
Basophils Absolute: 0 10*3/uL (ref 0.0–0.1)
Basophils Relative: 0 %
Eosinophils Absolute: 0.2 10*3/uL (ref 0.0–0.7)
Eosinophils Relative: 3 %
HCT: 43.1 % (ref 39.0–52.0)
Hemoglobin: 14.8 g/dL (ref 13.0–17.0)
Lymphocytes Relative: 32 %
Lymphs Abs: 1.9 10*3/uL (ref 0.7–4.0)
MCH: 30.8 pg (ref 26.0–34.0)
MCHC: 34.3 g/dL (ref 30.0–36.0)
MCV: 89.8 fL (ref 78.0–100.0)
Monocytes Absolute: 0.6 10*3/uL (ref 0.1–1.0)
Monocytes Relative: 10 %
Neutro Abs: 3.3 10*3/uL (ref 1.7–7.7)
Neutrophils Relative %: 55 %
Platelets: 183 10*3/uL (ref 150–400)
RBC: 4.8 MIL/uL (ref 4.22–5.81)
RDW: 13.3 % (ref 11.5–15.5)
WBC: 5.9 10*3/uL (ref 4.0–10.5)

## 2017-12-16 LAB — COMPREHENSIVE METABOLIC PANEL
ALT: 346 U/L — ABNORMAL HIGH (ref 17–63)
AST: 243 U/L — ABNORMAL HIGH (ref 15–41)
Albumin: 3.3 g/dL — ABNORMAL LOW (ref 3.5–5.0)
Alkaline Phosphatase: 106 U/L (ref 38–126)
Anion gap: 6 (ref 5–15)
BUN: 11 mg/dL (ref 6–20)
CO2: 29 mmol/L (ref 22–32)
Calcium: 8.8 mg/dL — ABNORMAL LOW (ref 8.9–10.3)
Chloride: 105 mmol/L (ref 101–111)
Creatinine, Ser: 0.7 mg/dL (ref 0.61–1.24)
GFR calc Af Amer: >60 mL/min (ref >60)
GFR calc non Af Amer: >60 mL/min (ref >60)
Glucose, Bld: 91 mg/dL (ref 65–99)
Potassium: 4.3 mmol/L (ref 3.5–5.1)
Sodium: 140 mmol/L (ref 135–145)
Total Bilirubin: 0.9 mg/dL (ref 0.3–1.2)
Total Protein: 6.8 g/dL (ref 6.5–8.1)

## 2017-12-16 LAB — ETHANOL: Alcohol, Ethyl (B): <10 mg/dL (ref <10)

## 2017-12-16 LAB — RAPID URINE DRUG SCREEN, HOSP PERFORMED
Amphetamines: NOT DETECTED
Barbiturates: NOT DETECTED
Benzodiazepines: NOT DETECTED
Cocaine: POSITIVE — AB
Opiates: NOT DETECTED
Tetrahydrocannabinol: NOT DETECTED

## 2017-12-16 LAB — ACETAMINOPHEN LEVEL: Acetaminophen (Tylenol), Serum: <10 ug/mL — ABNORMAL LOW (ref 10–30)

## 2017-12-16 LAB — SALICYLATE LEVEL: Salicylate Lvl: <7 mg/dL (ref 2.8–30.0)

## 2017-12-16 MED ORDER — ONDANSETRON 4 MG PO TBDP: 4.0000 mg | ORAL_TABLET | Freq: Four times a day (QID) | ORAL | Status: AC | PRN

## 2017-12-16 MED ORDER — VITAMIN B-1 100 MG PO TABS: 100.0000 mg | ORAL_TABLET | Freq: Every day | ORAL | Status: DC

## 2017-12-16 MED ORDER — LORAZEPAM 1 MG PO TABS: 0.0000 mg | ORAL_TABLET | Freq: Two times a day (BID) | ORAL | Status: DC

## 2017-12-16 MED ORDER — NICOTINE 21 MG/24HR TD PT24
21.0000 mg | MEDICATED_PATCH | Freq: Every day | TRANSDERMAL | Status: DC
  Administered 2017-12-17 – 2017-12-23 (×7): 21 mg via TRANSDERMAL
  Filled 2017-12-16 (×10): qty 1

## 2017-12-16 MED ORDER — NICOTINE 21 MG/24HR TD PT24
21.0000 mg | MEDICATED_PATCH | Freq: Once | TRANSDERMAL | Status: DC
  Administered 2017-12-16: 21 mg via TRANSDERMAL
  Filled 2017-12-16: qty 1

## 2017-12-16 MED ORDER — PANTOPRAZOLE SODIUM 40 MG PO TBEC: 80.0000 mg | DELAYED_RELEASE_TABLET | Freq: Every day | ORAL | Status: DC

## 2017-12-16 MED ORDER — PRAZOSIN HCL 2 MG PO CAPS: 2.0000 mg | ORAL_CAPSULE | Freq: Every day | ORAL | Status: DC

## 2017-12-16 MED ORDER — DICYCLOMINE HCL 20 MG PO TABS
20.0000 mg | ORAL_TABLET | Freq: Four times a day (QID) | ORAL | Status: AC | PRN
  Administered 2017-12-20 – 2017-12-21 (×2): 20 mg via ORAL
  Filled 2017-12-16 (×2): qty 1

## 2017-12-16 MED ORDER — LORAZEPAM 1 MG PO TABS: 0.0000 mg | ORAL_TABLET | Freq: Four times a day (QID) | ORAL | Status: DC

## 2017-12-16 MED ORDER — LORAZEPAM 2 MG/ML IJ SOLN: 0.0000 mg | Freq: Two times a day (BID) | INTRAMUSCULAR | Status: DC

## 2017-12-16 MED ORDER — LOPERAMIDE HCL 2 MG PO CAPS: 2.0000 mg | ORAL_CAPSULE | ORAL | Status: AC | PRN

## 2017-12-16 MED ORDER — ALBUTEROL SULFATE HFA 108 (90 BASE) MCG/ACT IN AERS
2.0000 | INHALATION_SPRAY | RESPIRATORY_TRACT | Status: DC | PRN
  Administered 2017-12-16: 2 via RESPIRATORY_TRACT

## 2017-12-16 MED ORDER — AZITHROMYCIN 250 MG PO TABS
250.0000 mg | ORAL_TABLET | Freq: Every day | ORAL | Status: DC
  Administered 2017-12-16: 250 mg via ORAL
  Filled 2017-12-16 (×2): qty 1

## 2017-12-16 MED ORDER — ACETAMINOPHEN 325 MG PO TABS: 650.0000 mg | ORAL_TABLET | Freq: Four times a day (QID) | ORAL | Status: DC | PRN

## 2017-12-16 MED ORDER — NICOTINE 21 MG/24HR TD PT24: 21.0000 mg | MEDICATED_PATCH | Freq: Every day | TRANSDERMAL | Status: DC

## 2017-12-16 MED ORDER — OPTICHAMBER DIAMOND MISC
1.0000 | Freq: Once | Status: DC
  Filled 2017-12-16: qty 1

## 2017-12-16 MED ORDER — TRAMADOL HCL 50 MG PO TABS: 50.0000 mg | ORAL_TABLET | Freq: Four times a day (QID) | ORAL | Status: DC | PRN

## 2017-12-16 MED ORDER — PANTOPRAZOLE SODIUM 40 MG PO TBEC
40.0000 mg | DELAYED_RELEASE_TABLET | Freq: Every day | ORAL | Status: DC
  Administered 2017-12-17 – 2017-12-23 (×7): 40 mg via ORAL
  Filled 2017-12-16 (×10): qty 1

## 2017-12-16 MED ORDER — ALBUTEROL SULFATE HFA 108 (90 BASE) MCG/ACT IN AERS: 2.0000 | INHALATION_SPRAY | Freq: Once | RESPIRATORY_TRACT | Status: DC

## 2017-12-16 MED ORDER — HYDROXYZINE HCL 25 MG PO TABS: 25.0000 mg | ORAL_TABLET | Freq: Four times a day (QID) | ORAL | Status: DC | PRN

## 2017-12-16 MED ORDER — ADULT MULTIVITAMIN W/MINERALS CH: 1.0000 | ORAL_TABLET | Freq: Every day | ORAL | Status: DC

## 2017-12-16 MED ORDER — METHOCARBAMOL 500 MG PO TABS
500.0000 mg | ORAL_TABLET | Freq: Three times a day (TID) | ORAL | Status: AC | PRN
  Administered 2017-12-16 – 2017-12-21 (×11): 500 mg via ORAL
  Filled 2017-12-16 (×11): qty 1

## 2017-12-16 MED ORDER — ALBUTEROL SULFATE HFA 108 (90 BASE) MCG/ACT IN AERS
2.0000 | INHALATION_SPRAY | Freq: Once | RESPIRATORY_TRACT | Status: AC
  Administered 2017-12-16: 2 via RESPIRATORY_TRACT
  Filled 2017-12-16: qty 6.7

## 2017-12-16 MED ORDER — HYDROXYZINE HCL 25 MG PO TABS
25.0000 mg | ORAL_TABLET | Freq: Four times a day (QID) | ORAL | Status: DC | PRN
  Administered 2017-12-16: 25 mg via ORAL
  Filled 2017-12-16: qty 1

## 2017-12-16 MED ORDER — PRAZOSIN HCL 2 MG PO CAPS
2.0000 mg | ORAL_CAPSULE | Freq: Every day | ORAL | Status: DC
  Administered 2017-12-16: 2 mg via ORAL
  Filled 2017-12-16 (×2): qty 1
  Filled 2017-12-16: qty 2
  Filled 2017-12-16: qty 1

## 2017-12-16 MED ORDER — LORAZEPAM 2 MG/ML IJ SOLN: 0.0000 mg | Freq: Four times a day (QID) | INTRAMUSCULAR | Status: DC

## 2017-12-16 MED ORDER — THIAMINE HCL 100 MG/ML IJ SOLN: 100.0000 mg | Freq: Every day | INTRAMUSCULAR | Status: DC

## 2017-12-16 MED ORDER — MAGNESIUM HYDROXIDE 400 MG/5ML PO SUSP: 30.0000 mL | Freq: Every day | ORAL | Status: DC | PRN

## 2017-12-16 NOTE — ED Notes (Signed)
Pt reports taking  of Methadone every morning per [facility] facility.

## 2017-12-16 NOTE — ED Provider Notes (Signed)
[facility] EMERGENCY DEPARTMENT Provider Note   CSN: [id number] Arrival date & time: 12/16/17  1233     History   Chief Complaint Chief Complaint  Patient presents with  . Suicidal    HPI Scott [name] is a 47 y.o. male Patient was sent to the ED by the [facility] clinic for suicidal evaluation.  He has felt suicidal acutely for the past 3 days, history of bipolar, alcohol abuse, and hepatitis C.  He has active suicide ideation with a plan.  He reports that he was seen 2 days ago and diagnosed with a possible pneumonia, he was started on azithromycin, and has not had today's dose yet, missed it this morning.    He denies any continued shortness of breath, no acute physical concerns.  He denies HI, AVH. He is requesting food, and nicotine patch at this time.  HPI  Past Medical History:  Diagnosis Date  . Accidental heroin overdose (HCC)   . Anxiety   . Back pain   . Bipolar 1 disorder (HCC)   . Chronic back pain   . Depression   . Drug-seeking behavior   . GERD (gastroesophageal reflux disease)   . Heroin abuse (HCC)   . History of ETOH abuse   . Myocardial infarction (HCC)   . Opioid dependence (HCC)   . PTSD (post-traumatic stress disorder)   . Seizures [facility])     Patient Active Problem List   Diagnosis Date Noted  . Schizoaffective disorder (HCC) 06/07/2017  . Alcohol use disorder, severe, dependence (HCC) 07/24/2015  . Alcohol withdrawal (HCC) 07/24/2015  . Cocaine use disorder, mild, abuse (HCC) 07/24/2015  . Substance induced mood disorder (HCC) 07/22/2015  . Drug-seeking behavior 02/16/2015  . PTSD (post-traumatic stress disorder) 12/26/2014  . Opioid type dependence, continuous (HCC) 12/26/2014  . Major depressive disorder, recurrent episode, moderate (HCC)   . Chest pain at rest 10/22/2013  . Chronic pain syndrome 10/22/2013  . Seizure disorder (HCC) 10/22/2013  . Chest pain 10/21/2013    Past Surgical History:  Procedure Laterality Date  .  ABDOMINAL SURGERY    . APPENDECTOMY    . BACK SURGERY          Home Medications    Prior to Admission medications   Medication Sig Start Date End Date Taking? Authorizing Provider  albuterol (PROVENTIL HFA;VENTOLIN HFA) 108 (90 Base) MCG/ACT inhaler Inhale 2 puffs into the lungs 4 (four) times daily as needed for wheezing or shortness of breath.  11/26/17  Yes [provider]  METHADONE HCL PO Take 60 mg by mouth daily.   Yes [provider]  Multiple Vitamin (MULTIVITAMIN WITH MINERALS) TABS tablet Take 1 tablet by mouth daily. 07/24/15  Yes Oneta Rack, NP  nicotine (NICODERM CQ - DOSED IN MG/24 HOURS) 21 mg/24hr patch Place 1 patch (21 mg total) onto the skin daily. 06/12/17  Yes Denzil Magnuson, NP  omeprazole (PRILOSEC) 40 MG capsule Take 40 mg by mouth at bedtime. 11/26/17  Yes [provider]  prazosin (MINIPRESS) 2 MG capsule Take 2 mg by mouth at bedtime. For night terrors 11/26/17  Yes [provider]  traMADol (ULTRAM) 50 MG tablet Take 50 mg by mouth every 6 (six) hours as needed. 11/27/17  Yes [provider]  hydrOXYzine (ATARAX/VISTARIL) 25 MG tablet Take 1 tablet (25 mg total) by mouth every 6 (six) hours as needed for anxiety. Patient not taking: Reported on 12/16/2017 06/11/17   Denzil Magnuson, NP  QUEtiapine (  SEROQUEL) 25 MG tablet Take 1 tablet (25 mg total) by mouth at bedtime. Patient not taking: Reported on 12/16/2017 06/11/17   Denzil Magnuson, NP    Family History Family History  Problem Relation Age of Onset  . Hypertension Mother   . CAD Father   . COPD Father   . Stroke Father   . Testicular cancer Brother     Social History Social History   Tobacco Use  . Smoking status: Current Every Day Smoker    Packs/day: 1.50    Types: Cigarettes  . Smokeless tobacco: Never Used  Substance Use Topics  . Alcohol use: Yes    Comment: a fifth and a half of whiskey per day  . Drug use: Yes    Types: Heroin,  Cocaine, Methamphetamines, Marijuana, IV    Comment: heroin     Allergies   Benadryl [diphenhydramine hcl]; Celecoxib; Diphenhydramine; Prednisone; Risperidone and related; Nsaids; Other; Rofecoxib; Tolmetin; Trazodone and nefazodone; Ibuprofen; Latex; and Paliperidone palmitate er   Review of Systems Review of Systems  Constitutional: Negative for appetite change, chills and fever.  HENT: Negative for ear pain and sore throat.   Eyes: Negative for pain and visual disturbance.  Respiratory: Negative for cough and shortness of breath.   Cardiovascular: Negative for chest pain and palpitations.  Gastrointestinal: Negative for abdominal pain and vomiting.  Genitourinary: Negative for dysuria and hematuria.  Musculoskeletal: Negative for arthralgias and back pain.  Skin: Negative for color change and rash.  Neurological: Negative for seizures and syncope.  Psychiatric/Behavioral: Positive for behavioral problems, dysphoric mood and suicidal ideas. The patient is not nervous/anxious.   All other systems reviewed and are negative.    Physical Exam Updated Vital Signs BP 129/70 (BP Location: Right Arm)   Pulse (!) 50   Temp 98.5 F (36.9 C) (Oral)   Resp 16   SpO2 98%   Physical Exam  Constitutional: He appears well-developed and well-nourished. No distress.  HENT:  Head: Normocephalic and atraumatic.  Eyes: Conjunctivae are normal. Right eye exhibits no discharge. Left eye exhibits no discharge. No scleral icterus.  Neck: Normal range of motion.  Cardiovascular: Normal rate and regular rhythm.  Pulmonary/Chest: Effort normal. No stridor. No respiratory distress. He has rhonchi (Slight) in the right upper field, the right middle field, the right lower field, the left upper field, the left middle field and the left lower field. He has no rales.  Abdominal: He exhibits no distension.  Musculoskeletal: He exhibits no edema or deformity.  Neurological: He is alert. He exhibits  normal muscle tone.  Skin: Skin is warm and dry. He is not diaphoretic.  Psychiatric: He has a normal mood and affect. His behavior is normal.  Nursing note and vitals reviewed.    ED Treatments / Results  Labs (all labs ordered are listed, but only abnormal results are displayed) Labs Reviewed  COMPREHENSIVE METABOLIC PANEL - Abnormal; Notable for the following components:      Result Value   Calcium 8.8 (*)    Albumin 3.3 (*)    AST 243 (*)    ALT 346 (*)    All other components within normal limits  ACETAMINOPHEN LEVEL - Abnormal; Notable for the following components:   Acetaminophen (Tylenol), Serum <10 (*)    All other components within normal limits  RAPID URINE DRUG SCREEN, HOSP PERFORMED - Abnormal; Notable for the following components:   Cocaine POSITIVE (*)    All other components within normal limits  ETHANOL  SALICYLATE LEVEL  CBC WITH DIFFERENTIAL/PLATELET    EKG None  Radiology Dg Chest 2 View  Result Date: 12/16/2017 CLINICAL DATA:  Productive cough for 1 week. EXAM: CHEST - 2 VIEW COMPARISON:  12/12/2017 and prior exams FINDINGS: The cardiomediastinal silhouette is unremarkable. There is no evidence of focal airspace disease, pulmonary edema, suspicious pulmonary nodule/mass, pleural effusion, or pneumothorax. No acute bony abnormalities are identified. IMPRESSION: No active cardiopulmonary disease. Electronically Signed   By: Harmon Pier M.D.   On: 12/16/2017 14:56    Procedures Procedures (including critical care time)  Medications Ordered in ED Medications  nicotine (NICODERM CQ - dosed in mg/24 hours) patch 21 mg (21 mg Transdermal Patch Applied 12/16/17 1413)  azithromycin (ZITHROMAX) tablet 250 mg (250 mg Oral Given 12/16/17 1922)  albuterol (PROVENTIL HFA;VENTOLIN HFA) 108 (90 Base) MCG/ACT inhaler 2 puff (2 puffs Inhalation Given 12/16/17 1951)  optichamber diamond 1 each (has no administration in time range)  nicotine (NICODERM CQ - dosed in mg/24  hours) patch 21 mg (has no administration in time range)  hydrOXYzine (ATARAX/VISTARIL) tablet 25 mg (has no administration in time range)  multivitamin with minerals tablet 1 tablet (has no administration in time range)  pantoprazole (PROTONIX) EC tablet 80 mg (has no administration in time range)  prazosin (MINIPRESS) capsule 2 mg (has no administration in time range)  traMADol (ULTRAM) tablet 50 mg (has no administration in time range)  albuterol (PROVENTIL HFA;VENTOLIN HFA) 108 (90 Base) MCG/ACT inhaler 2 puff (has no administration in time range)  albuterol (PROVENTIL HFA;VENTOLIN HFA) 108 (90 Base) MCG/ACT inhaler 2 puff (2 puffs Inhalation Given 12/16/17 1922)     Initial Impression / Assessment and Plan / ED Course  I have reviewed the triage vital signs and the nursing notes.  Pertinent labs & imaging results that were available during my care of the patient were reviewed by me and considered in my medical decision making (see chart for details).  Clinical Course as of Dec 17 1955  Fri Dec 16, 2017  1421 EKG not yet obtained, patient will need azithromycin once EKG obtained.    [EH]  1912 Patient needs albuterol inhaler administration and re-evaluation prior to medical clear.    [EH]  1950 Patient reevaluated, breathing is much improved however still has slight rhonchi.  Will give second round of 2 puffs of albuterol.  Patient medically clear.    [EH]    Clinical Course User Index [EH] Cristina Gong, PA-C   Patient presents today for evaluation of suicidal ideation.  His plan is to either hang himself or overdose on Seroquel.  Patient reports that he was recently treated with azithromycin for pneumonia.  He has a long-standing smoking history, today chest x-ray is normal, suspect that this is more of a exacerbation of COPD than true pneumonia.  His azithromycin was continued.  He received 2 puffs of albuterol which provided improvement, after he was given on another 2 puffs  and medically cleared.  Steroids were not started as these reportedly caused him to have severe anger issues.  Home medications were reordered.  Patient medically clear.  TTS recommends inpatient placement.  Patient has a bed at [facility].  Final Clinical Impressions(s) / ED Diagnoses   Final diagnoses:  Suicidal ideation    ED Discharge Orders    None       Norman Clay 12/16/17 1957    Tegeler, Canary Brim, MD 12/17/17 0028

## 2017-12-16 NOTE — ED Notes (Signed)
Patient provided with turkey sandwich and coke.  
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
## 2017-12-16 NOTE — Progress Notes (Signed)
Pt accepted to [facility], room #301-2. [employer], [place] is the accepting provider.   Dr. Tamera Punt is the attending provider.   Call report to [phone]. Steward Drone @ [facility] ED notified.   Pt is voluntary and can be transported by [facility].  Pt is scheduled to arrive at [facility] at 7:30pm.   Wells Guiles, LCSW, LCAS Disposition CSW [facility]/TTS [phone]

## 2017-12-16 NOTE — ED Notes (Signed)
Regular Diet Ordered for Patient. 

## 2017-12-16 NOTE — ED Notes (Signed)
Security paged to wand pt 

## 2017-12-16 NOTE — ED Notes (Signed)
[facility] called and requested methadone urine test.

## 2017-12-16 NOTE — ED Notes (Addendum)
Pt belongings inventoried and placed in belongings bag. 1 bag given to [place] transportation. Belongings including pt's Samsung cell phone and wallet. Pt retains glasses.

## 2017-12-16 NOTE — ED Triage Notes (Signed)
Staffing notified of need for suicide sitter 
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
## 2017-12-17 DIAGNOSIS — Z630 Problems in relationship with spouse or partner: Secondary | ICD-10-CM

## 2017-12-17 DIAGNOSIS — R450 Nervousness: Secondary | ICD-10-CM

## 2017-12-17 DIAGNOSIS — F1099 Alcohol use, unspecified with unspecified alcohol-induced disorder: Secondary | ICD-10-CM

## 2017-12-17 DIAGNOSIS — F17210 Nicotine dependence, cigarettes, uncomplicated: Secondary | ICD-10-CM

## 2017-12-17 DIAGNOSIS — Z818 Family history of other mental and behavioral disorders: Secondary | ICD-10-CM

## 2017-12-17 DIAGNOSIS — R45851 Suicidal ideations: Secondary | ICD-10-CM

## 2017-12-17 DIAGNOSIS — F515 Nightmare disorder: Secondary | ICD-10-CM

## 2017-12-17 DIAGNOSIS — F1290 Cannabis use, unspecified, uncomplicated: Secondary | ICD-10-CM

## 2017-12-17 DIAGNOSIS — F4310 Post-traumatic stress disorder, unspecified: Secondary | ICD-10-CM

## 2017-12-17 DIAGNOSIS — Y900 Blood alcohol level of less than 20 mg/100 ml: Secondary | ICD-10-CM

## 2017-12-17 DIAGNOSIS — F1490 Cocaine use, unspecified, uncomplicated: Secondary | ICD-10-CM

## 2017-12-17 DIAGNOSIS — F419 Anxiety disorder, unspecified: Secondary | ICD-10-CM

## 2017-12-17 DIAGNOSIS — F322 Major depressive disorder, single episode, severe without psychotic features: Secondary | ICD-10-CM

## 2017-12-17 DIAGNOSIS — F1120 Opioid dependence, uncomplicated: Secondary | ICD-10-CM

## 2017-12-17 MED ORDER — BUSPIRONE HCL 5 MG PO TABS
5.0000 mg | ORAL_TABLET | Freq: Two times a day (BID) | ORAL | Status: DC
  Administered 2017-12-17 – 2017-12-19 (×4): 5 mg via ORAL
  Filled 2017-12-17 (×8): qty 1

## 2017-12-17 MED ORDER — METHADONE HCL 10 MG PO TABS
60.0000 mg | ORAL_TABLET | Freq: Every day | ORAL | Status: DC
  Administered 2017-12-17 – 2017-12-23 (×7): 60 mg via ORAL
  Filled 2017-12-17 (×7): qty 6

## 2017-12-17 MED ORDER — SERTRALINE HCL 25 MG PO TABS
25.0000 mg | ORAL_TABLET | Freq: Every day | ORAL | Status: DC
  Administered 2017-12-18: 25 mg via ORAL
  Filled 2017-12-17 (×2): qty 1

## 2017-12-17 MED ORDER — GUAIFENESIN ER 600 MG PO TB12
600.0000 mg | ORAL_TABLET | Freq: Two times a day (BID) | ORAL | Status: DC
  Administered 2017-12-17 – 2017-12-23 (×12): 600 mg via ORAL
  Filled 2017-12-17 (×20): qty 1

## 2017-12-17 MED ORDER — ALBUTEROL SULFATE HFA 108 (90 BASE) MCG/ACT IN AERS
1.0000 | INHALATION_SPRAY | RESPIRATORY_TRACT | Status: DC | PRN
  Administered 2017-12-17 – 2017-12-23 (×11): 2 via RESPIRATORY_TRACT
  Filled 2017-12-17 (×2): qty 6.7

## 2017-12-17 MED ORDER — PNEUMOCOCCAL VAC POLYVALENT 25 MCG/0.5ML IJ INJ
0.5000 mL | INJECTION | INTRAMUSCULAR | Status: AC
  Administered 2017-12-20: 0.5 mL via INTRAMUSCULAR

## 2017-12-17 MED ORDER — PRAZOSIN HCL 1 MG PO CAPS
2.0000 mg | ORAL_CAPSULE | Freq: Every day | ORAL | Status: DC
  Administered 2017-12-18 – 2017-12-22 (×5): 2 mg via ORAL
  Filled 2017-12-17 (×7): qty 2

## 2017-12-17 MED ORDER — AZITHROMYCIN 250 MG PO TABS
250.0000 mg | ORAL_TABLET | Freq: Every day | ORAL | Status: AC
  Administered 2017-12-17 – 2017-12-18 (×2): 250 mg via ORAL
  Filled 2017-12-17 (×3): qty 1

## 2017-12-17 MED ORDER — PRAZOSIN HCL 1 MG PO CAPS
1.0000 mg | ORAL_CAPSULE | Freq: Every day | ORAL | Status: DC
  Administered 2017-12-17: 1 mg via ORAL
  Filled 2017-12-17 (×2): qty 1

## 2017-12-17 NOTE — Tx Team (Signed)
Initial Treatment Plan 12/17/2017 12:19 AM [name] AOZ:[id number]    PATIENT STRESSORS: Financial difficulties Health problems Marital or family conflict Substance abuse   PATIENT STRENGTHS: Ability for insight Average or above average intelligence Capable of independent living Communication skills General fund of knowledge Motivation for treatment/growth   PATIENT IDENTIFIED PROBLEMS: Depression  Marital conflict  Substance abuse(cocaine)  Risk for self harm  Multiple health issues    "I need help with my suicidal thoughts"  " I need to find ways for self improvement"       DISCHARGE CRITERIA:  Ability to meet basic life and health needs Adequate post-discharge living arrangements Improved stabilization in mood, thinking, and/or behavior Motivation to continue treatment in a less acute level of care Need for constant or close observation no longer present Reduction of life-threatening or endangering symptoms to within safe limits Verbal commitment to aftercare and medication compliance  PRELIMINARY DISCHARGE PLAN: Attend aftercare/continuing care group Outpatient therapy Placement in alternative living arrangements  PATIENT/FAMILY INVOLVEMENT: This treatment plan has been presented to and reviewed with the patient, Scott [name], and/or family member.  The patient and family have been given the opportunity to ask questions and make suggestions.  Charlott Holler, RN 12/17/2017, 12:19 AM

## 2017-12-17 NOTE — [facility] Suicide Risk Assessment (Signed)
[facility] INPATIENT:  Family/Significant Other Suicide Prevention Education  Suicide Prevention Education:  Patient Refusal for Family/Significant Other Suicide Prevention Education: The patient Scott [name] has refused to provide written consent for family/significant other to be provided Family/Significant Other Suicide Prevention Education during admission and/or prior to discharge.  Physician notified.  Suicide Prevention Education was reviewed thoroughly with patient, including risk factors, warning signs, and what to do.  Mobile Crisis services were described and that telephone number pointed out, with encouragement to patient to put this number in personal cell phone.  Brochure was provided to patient to share with loved ones.  Patient acknowledged the ways in which they are at risk, and how working through each of their issues can gradually start to reduce their risk factors.  Patient was encouraged to think of the information in the context of people in their own lives.  Patient denied having access to weapons.  Patient endorsed a desire to live.   [name] 12/17/2017, 2:38 PM
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
## 2017-12-17 NOTE — [facility] Group Notes (Signed)
LCSW Group Therapy Note  12/17/2017   9:30--10:30am   Type of Therapy and Topic:  Group Therapy: Anger Cues and Responses  Participation Level:  Active   Description of Group:   In this group, patients learned how to recognize the physical, cognitive, emotional, and behavioral responses they have to anger-provoking situations.  They identified a recent time they became angry and how they reacted.  They analyzed how their reaction was possibly beneficial and how it was possibly unhelpful.  The group discussed a variety of healthier coping skills that could help with such a situation in the future.  Deep breathing was practiced briefly.  Therapeutic Goals: 1. Patients will remember their last incident of anger and how they felt emotionally and physically, what their thoughts were at the time, and how they behaved. 2. Patients will identify how their behavior at that time worked for them, as well as how it worked against them. 3. Patients will explore possible new behaviors to use in future anger situations. 4. Patients will learn that anger itself is normal and cannot be eliminated, and that healthier reactions can assist with resolving conflict rather than worsening situations.  Summary of Patient Progress:  The patient shared that his most recent time of anger was yesterday and said his wife tries to "keep me down."  He said a psychiatrist has told him that when he does well, his wife's desire to keep him sick is co-dependency.  He was in and out of the room a little, was very sleepy at times while present.  Other times he became alert and participated well.  He talked about having Dissociative Identity Disorder and not remembering things he does, but another time talked about how he has been arrested 10-15 times for not doing anything.  Therapeutic Modalities:   Cognitive Behavioral Therapy  Lynnell Chad

## 2017-12-17 NOTE — H&P (Addendum)
Psychiatric Admission Assessment Adult  Patient Identification: Scott [name] MRN:  [id number] Date of Evaluation:  12/17/2017 Chief Complaint:  MDD Principal Diagnosis: MDD (major depressive disorder), severe ([place]) Diagnosis:   Patient Active Problem List   Diagnosis Date Noted  . MDD (major depressive disorder), severe ([place]) [F32.2] 12/16/2017  . Schizoaffective disorder ([place]) [F25.9] 06/07/2017  . Alcohol use disorder, severe, dependence ([place]) [F10.20] 07/24/2015  . Alcohol withdrawal ([place]) [F10.239] 07/24/2015  . Cocaine use disorder, mild, abuse ([place]) [F14.10] 07/24/2015  . Substance induced mood disorder ([place]) [F19.94] 07/22/2015  . Drug-seeking behavior [Z76.5] 02/16/2015  . PTSD (post-traumatic stress disorder) [F43.10] 12/26/2014  . Opioid type dependence, continuous ([place]) [F11.20] 12/26/2014  . Major depressive disorder, recurrent episode, moderate ([facility]) [F33.1]   . Chest pain at rest [R07.9] 10/22/2013  . Chronic pain syndrome [G89.4] 10/22/2013  . Seizure disorder ([place]) [F81.017] 10/22/2013  . Chest pain [R07.9] 10/21/2013   History of Present Illness: Scott [name] 47 year old caucasian male present with depression and suicidal ideations.  is awake alert and oriented x3 flat guarded affect.  Reports feeling depressed due to his wife keeps bringing up his past, States her statement makes him have suicidial thoughts.  Reports he is never good enough for her and reports feelings of despair and hopelessness.  Rates his depression 10 /10 during this assessment patient is requesting to be restarted on methadone 60 mg reports he is prescribed by Dr. [name] at [facility].  UDS positive for cocaine . denies that he is prescribed and anyother antidepressants or psychotropic medications. See SRA for medication management. Support encouragement and reassurance was provided.   Associated Signs/Symptoms: Depression Symptoms:  depressed mood, feelings of worthlessness/guilt, disturbed sleep, decreased  appetite, (Hypo) Manic Symptoms:  Distractibility, Anxiety Symptoms:  Excessive Worry, Psychotic Symptoms:  Hallucinations: None PTSD Symptoms: Avoidance:  Decreased Interest/Participation Total Time spent with patient: 20 minutes  Past Psychiatric History:  Is the patient at risk to self? Yes.    Has the patient been a risk to self in the past 6 months? Yes.    Has the patient been a risk to self within the distant past? No.  Is the patient a risk to others? No.  Has the patient been a risk to others in the past 6 months? No.  Has the patient been a risk to others within the distant past? No.   Prior Inpatient Therapy:   Prior Outpatient Therapy:    Alcohol Screening: 1. How often do you have a drink containing alcohol?: Never 2. How many drinks containing alcohol do you have on a typical day when you are drinking?: 1 or 2 3. How often do you have six or more drinks on one occasion?: Never AUDIT-C Score: 0 4. How often during the last year have you found that you were not able to stop drinking once you had started?: Never 5. How often during the last year have you failed to do what was normally expected from you becasue of drinking?: Never 6. How often during the last year have you needed a first drink in the morning to get yourself going after a heavy drinking session?: Never 7. How often during the last year have you had a feeling of guilt of remorse after drinking?: Never 8. How often during the last year have you been unable to remember what happened the night before because you had been drinking?: Never 9. Have you or someone else been injured as a result of your drinking?: No 10. Has  a relative or friend or a doctor or another health worker been concerned about your drinking or suggested you cut down?: Yes, during the last year("I have been sober from alcohol for 6 months") Alcohol Use Disorder Identification Test Final Score (AUDIT): 4 Intervention/Follow-up: AUDIT Score <7  follow-up not indicated Substance Abuse History in the last 12 months:  Yes.   Consequences of Substance Abuse: NA Previous Psychotropic Medications: Psychological Evaluations:  Past Medical History:  Past Medical History:  Diagnosis Date  . Accidental heroin overdose ([place])   . Anxiety   . Back pain   . Bipolar 1 disorder ([place])   . Chronic back pain   . Depression   . Drug-seeking behavior   . GERD (gastroesophageal reflux disease)   . Heroin abuse ([place])   . History of ETOH abuse   . Myocardial infarction [facility])    Pt says that the doctor determined it was panic attacks  . Opioid dependence ([place])   . PTSD (post-traumatic stress disorder)   . Seizures ([place])    alcohol induced- pt reports sober for 6 months    Past Surgical History:  Procedure Laterality Date  . ABDOMINAL SURGERY    . APPENDECTOMY    . BACK SURGERY     Family History:  Family History  Problem Relation Age of Onset  . Hypertension Mother   . CAD Father   . COPD Father   . Stroke Father   . Testicular cancer Brother    Family Psychiatric  History: Reports mother side of the family has history of depression schizophrenia reports uncle: Psychosis Tobacco Screening: Have you used any form of tobacco in the last 30 days? (Cigarettes, Smokeless Tobacco, Cigars, and/or Pipes): Yes Tobacco use, Select all that apply: 5 or more cigarettes per day Are you interested in Tobacco Cessation Medications?: Yes, will notify MD for an order Counseled patient on smoking cessation including recognizing danger situations, developing coping skills and basic information about quitting provided: Refused/Declined practical counseling Social History:  Social History   Substance and Sexual Activity  Alcohol Use Not Currently   Comment: Pt reports sober for 6 months     Social History   Substance and Sexual Activity  Drug Use Yes  . Types: Cocaine, Methamphetamines, Marijuana, IV   Comment: Pt says not sure why positive for  cocaine    Additional Social History:      Pain Medications: See home med list Prescriptions: See home med list Over the Counter: See home med list History of alcohol / drug use?: Yes Longest period of sobriety (when/how long): Pt reports 6 mon sober from alcohol Negative Consequences of Use: Financial, Personal relationships, Work / School   Name of Substance 2: cocaine 2 - Age of First Use: 39 2 - Amount (size/oz): "just a little" 2 - Frequency: occasionally 2 - Duration: ongoing 2 - Last Use / Amount: 12/15/17 Name of Substance 3: THC 3 - Age of First Use: 29 3 - Amount (size/oz): varies 3 - Frequency: occasionally 3 - Duration: ongoing 3 - Last Use / Amount: unsure              Allergies:   Allergies  Allergen Reactions  . Benadryl [Diphenhydramine Hcl] Anaphylaxis and Other (See Comments)    Muscles lock up.  . Celecoxib Diarrhea, Rash and Other (See Comments)    Nose bleeds, bloating, migraines   . Diphenhydramine Anaphylaxis    Hard time breathing, convulsions,  . Prednisone Other (See Comments)  Severe Anger  . Risperidone And Related Other (See Comments)    Overly sedates  . Nsaids Diarrhea, Rash and Other (See Comments)    Nose bleeds, bloating, migraines  . Other Other (See Comments)    STEROIDS--caused anger,irritation  . Rofecoxib Diarrhea, Rash and Other (See Comments)    Nose bleeds, bloating, migraines  . Tolmetin Diarrhea, Rash and Other (See Comments)    Nose bleeds, bloating, migraines   . Trazodone And Nefazodone Other (See Comments)    headaches  . Ibuprofen Rash  . Latex Itching, Rash and Other (See Comments)    Skin cracks  . Paliperidone Palmitate Er Other (See Comments)    Overly sedates   Lab Results:  Results for orders placed or performed during the hospital encounter of 12/16/17 (from the past 48 hour(s))  Comprehensive metabolic panel     Status: Abnormal   Collection Time: 12/16/17  1:31 PM  Result Value Ref Range    Sodium 140 135 - 145 mmol/L   Potassium 4.3 3.5 - 5.1 mmol/L   Chloride 105 101 - 111 mmol/L   CO2 29 22 - 32 mmol/L   Glucose, Bld 91 65 - 99 mg/dL   BUN 11 6 - 20 mg/dL   Creatinine, Ser 0.70 0.61 - 1.24 mg/dL   Calcium 8.8 (L) 8.9 - 10.3 mg/dL   Total Protein 6.8 6.5 - 8.1 g/dL   Albumin 3.3 (L) 3.5 - 5.0 g/dL   AST 243 (H) 15 - 41 U/L   ALT 346 (H) 17 - 63 U/L   Alkaline Phosphatase 106 38 - 126 U/L   Total Bilirubin 0.9 0.3 - 1.2 mg/dL   GFR calc non Af Amer >60 >60 mL/min   GFR calc Af Amer >60 >60 mL/min    Comment: (NOTE) The eGFR has been calculated using the CKD EPI equation. This calculation has not been validated in all clinical situations. eGFR's persistently <60 mL/min signify possible Chronic Kidney Disease.    Anion gap 6 5 - 15    Comment: Performed at [facility] [address] [place], [place] [address]  Ethanol     Status: None   Collection Time: 12/16/17  1:32 PM  Result Value Ref Range   Alcohol, Ethyl (B) <10 <10 mg/dL    Comment:        LOWEST DETECTABLE LIMIT FOR SERUM ALCOHOL IS 10 mg/dL FOR MEDICAL PURPOSES ONLY Performed at [facility], [place] [address], [place], [place] [address]   Salicylate level     Status: None   Collection Time: 12/16/17  1:32 PM  Result Value Ref Range   Salicylate Lvl <4.5 2.8 - 30.0 mg/dL    Comment: Performed at [facility] [address] [place], [place] [address]  Acetaminophen level     Status: Abnormal   Collection Time: 12/16/17  1:32 PM  Result Value Ref Range   Acetaminophen (Tylenol), Serum <10 (L) 10 - 30 ug/mL    Comment:        THERAPEUTIC CONCENTRATIONS VARY SIGNIFICANTLY. A RANGE OF 10-30 ug/mL MAY BE AN EFFECTIVE CONCENTRATION FOR MANY PATIENTS. HOWEVER, SOME ARE BEST TREATED AT CONCENTRATIONS OUTSIDE THIS RANGE. ACETAMINOPHEN CONCENTRATIONS >150 ug/mL AT 4 HOURS AFTER INGESTION AND >50 ug/mL AT 12 HOURS AFTER INGESTION ARE OFTEN ASSOCIATED WITH  TOXIC REACTIONS. Performed at [facility], [place] [address] [place], [place] [address]   CBC with Differential     Status: None  Collection Time: 12/16/17  1:42 PM  Result Value Ref Range   WBC 5.9 4.0 - 10.5 K/uL   RBC 4.80 4.22 - 5.81 MIL/uL   Hemoglobin 14.8 13.0 - 17.0 g/dL   HCT 43.1 39.0 - 52.0 %   MCV 89.8 78.0 - 100.0 fL   MCH 30.8 26.0 - 34.0 pg   MCHC 34.3 30.0 - 36.0 g/dL   RDW 13.3 11.5 - 15.5 %   Platelets 183 150 - 400 K/uL   Neutrophils Relative % 55 %   Neutro Abs 3.3 1.7 - 7.7 K/uL   Lymphocytes Relative 32 %   Lymphs Abs 1.9 0.7 - 4.0 K/uL   Monocytes Relative 10 %   Monocytes Absolute 0.6 0.1 - 1.0 K/uL   Eosinophils Relative 3 %   Eosinophils Absolute 0.2 0.0 - 0.7 K/uL   Basophils Relative 0 %   Basophils Absolute 0.0 0.0 - 0.1 K/uL    Comment: Performed at [facility] [address] [place], [place] [address]  Rapid urine drug screen (hospital performed)     Status: Abnormal   Collection Time: 12/16/17  5:42 PM  Result Value Ref Range   Opiates NONE DETECTED NONE DETECTED   Cocaine POSITIVE (A) NONE DETECTED   Benzodiazepines NONE DETECTED NONE DETECTED   Amphetamines NONE DETECTED NONE DETECTED   Tetrahydrocannabinol NONE DETECTED NONE DETECTED   Barbiturates NONE DETECTED NONE DETECTED    Comment: (NOTE) DRUG SCREEN FOR MEDICAL PURPOSES ONLY.  IF CONFIRMATION IS NEEDED FOR ANY PURPOSE, NOTIFY LAB WITHIN 5 DAYS. LOWEST DETECTABLE LIMITS FOR URINE DRUG SCREEN Drug Class                     Cutoff (ng/mL) Amphetamine and metabolites    1000 Barbiturate and metabolites    200 Benzodiazepine                 502 Tricyclics and metabolites     300 Opiates and metabolites        300 Cocaine and metabolites        300 THC                            50 Performed at [facility], [place] [address] [place], [place] [address]     Blood Alcohol level:  Lab Results  Component Value Date   ETH <10 12/16/2017   ETH <10 87/86/7672     Metabolic Disorder Labs:  No results found for: HGBA1C, MPG No results found for: PROLACTIN No results found for: CHOL, TRIG, HDL, CHOLHDL, VLDL, LDLCALC  Current Medications: Current Facility-Administered Medications  Medication Dose Route Frequency Provider Last Rate Last Dose  . acetaminophen (TYLENOL) tablet 650 mg  650 mg Oral Q6H PRN Money, Lowry Ram, FNP      . albuterol (PROVENTIL HFA;VENTOLIN HFA) 108 (90 Base) MCG/ACT inhaler 1-2 puff  1-2 puff Inhalation Q4H PRN Derrill Center, NP      . busPIRone (BUSPAR) tablet 5 mg  5 mg Oral BID Cobos, Myer Peer, MD      . dicyclomine (BENTYL) tablet 20 mg  20 mg Oral Q6H PRN Money, Lowry Ram, FNP      . loperamide (IMODIUM) capsule 2-4 mg  2-4 mg Oral PRN Money, Lowry Ram, FNP      . magnesium hydroxide (MILK OF MAGNESIA) suspension 30 mL  30 mL Oral Daily PRN Money, Lowry Ram,  FNP      . methadone (DOLOPHINE) tablet 60 mg  60 mg Oral Daily Cobos, Myer Peer, MD   60 mg at 12/17/17 1101  . methocarbamol (ROBAXIN) tablet 500 mg  500 mg Oral Q8H PRN Money, Lowry Ram, FNP   500 mg at 12/16/17 2238  . nicotine (NICODERM CQ - dosed in mg/24 hours) patch 21 mg  21 mg Transdermal Daily Nanci Pina, FNP   21 mg at 12/17/17 0818  . ondansetron (ZOFRAN-ODT) disintegrating tablet 4 mg  4 mg Oral Q6H PRN Money, Lowry Ram, FNP      . pantoprazole (PROTONIX) EC tablet 40 mg  40 mg Oral Daily Nanci Pina, FNP   40 mg at 12/17/17 0818  . [START ON 12/18/2017] pneumococcal 23 valent vaccine (PNU-IMMUNE) injection 0.5 mL  0.5 mL Intramuscular Tomorrow-1000 Sharma Covert, MD      . prazosin (MINIPRESS) capsule 1 mg  1 mg Oral QHS Cobos, Myer Peer, MD      . Derrill Memo ON 12/18/2017] sertraline (ZOLOFT) tablet 25 mg  25 mg Oral Daily Cobos, Myer Peer, MD       PTA Medications: Medications Prior to Admission  Medication Sig Dispense Refill Last Dose  . albuterol (PROVENTIL HFA;VENTOLIN HFA) 108 (90 Base) MCG/ACT inhaler Inhale 2 puffs into the lungs  4 (four) times daily as needed for wheezing or shortness of breath.   0 12/16/2017 at noon  . hydrOXYzine (ATARAX/VISTARIL) 25 MG tablet Take 1 tablet (25 mg total) by mouth every 6 (six) hours as needed for anxiety. (Patient not taking: Reported on 12/16/2017) 30 tablet 0 Not Taking at Unknown time  . METHADONE HCL PO Take 60 mg by mouth daily.   12/16/2017 at 930  . Multiple Vitamin (MULTIVITAMIN WITH MINERALS) TABS tablet Take 1 tablet by mouth daily. 30 tablet 0 12/16/2017 at am  . nicotine (NICODERM CQ - DOSED IN MG/24 HOURS) 21 mg/24hr patch Place 1 patch (21 mg total) onto the skin daily. 28 patch 0 12/15/2017 at Unknown time  . omeprazole (PRILOSEC) 40 MG capsule Take 40 mg by mouth at bedtime.  0 12/15/2017 at pm  . prazosin (MINIPRESS) 2 MG capsule Take 2 mg by mouth at bedtime. For night terrors  0 12/15/2017 at pm  . QUEtiapine (SEROQUEL) 25 MG tablet Take 1 tablet (25 mg total) by mouth at bedtime. (Patient not taking: Reported on 12/16/2017) 30 tablet 0 Not Taking at Unknown time  . traMADol (ULTRAM) 50 MG tablet Take 50 mg by mouth every 6 (six) hours as needed.  0 couple days ago    Musculoskeletal: Strength & Muscle Tone: within normal limits Gait & Station: normal Patient leans: N/A  Psychiatric Specialty Exam: Physical Exam  Vitals reviewed. Constitutional: He appears well-developed.  Cardiovascular: Normal rate.  Neurological: He is alert.  Psychiatric: He has a normal mood and affect. His behavior is normal.    Review of Systems  Psychiatric/Behavioral: Positive for depression and suicidal ideas. The patient is nervous/anxious.   All other systems reviewed and are negative.   Blood pressure 113/72, pulse 89, temperature 98 F (36.7 C), temperature source Oral, resp. rate 18, height '5\' 10"'  (1.778 m), weight 121.1 kg (267 lb), SpO2 98 %.Body mass index is 38.31 kg/m.  General Appearance: Casual and Guarded  Eye Contact:  Fair  Speech:  Clear and Coherent  Volume:  Normal   Mood:  Depressed  Affect:  Depressed and Flat  Thought Process:  Coherent  Orientation:  Full (Time, Place, and Person)  Thought Content:  Hallucinations: None and Rumination  Suicidal Thoughts:  Yes.  with intent/plan  Homicidal Thoughts:  No  Memory:  Immediate;   Fair Recent;   Fair Remote;   Fair  Judgement:  Fair  Insight:  Present  Psychomotor Activity:  Normal  Concentration:  Concentration: Fair  Recall:  Poor  Fund of Knowledge:  Fair  Language:  Fair  Akathisia:  No  Handed:  Right  AIMS (if indicated):     Assets:  Communication Skills Desire for Improvement Resilience Social Support  ADL's:  Intact  Cognition:  WNL  Sleep:  Number of Hours: 6    Treatment Plan Summary: Daily contact with patient to assess and evaluate symptoms and progress in treatment and Medication management See SRA for medications management    Observation Level/Precautions:  15 minute checks  Laboratory:  CBC Chemistry Profile HbAIC UDS  Psychotherapy:  individual  and group sessio   Medications:  See sra  Consultations: csw and psychiatry   Discharge Concerns:  Safety, stabilization, and risk of access to medication and medication stabilization   Estimated LOS: 5-7days  Other:     Physician Treatment Plan for Primary Diagnosis: MDD (major depressive disorder), severe ([place]) Long Term Goal(s): Improvement in symptoms so as ready for discharge  Short Term Goals: Ability to identify changes in lifestyle to reduce recurrence of condition will improve, Ability to disclose and discuss suicidal ideas, Ability to maintain clinical measurements within normal limits will improve and Ability to identify triggers associated with substance abuse/mental health issues will improve  Physician Treatment Plan for Secondary Diagnosis: Principal Problem:   MDD (major depressive disorder), severe ([place])  Long Term Goal(s): Improvement in symptoms so as ready for discharge  Short Term Goals:  Ability to verbalize feelings will improve, Ability to disclose and discuss suicidal ideas and Ability to identify and develop effective coping behaviors will improve  I certify that inpatient services furnished can reasonably be expected to improve the patient's condition.    Derrill Center, NP 5/11/20192:15 PM   I have discussed case with NP and have met with patient  Agree with NP note and assessment  47 year old married male, three children, on disability. Presented to ED due to worsening depression, suicidal ideations.  Patient reports that marital tension is a major stressor, states " she is verbally abusive, she puts me down all the time, nothing I do is right". Of note, patient reports that he is on Methadone Maintenance through [facility], with dose being 60 mgrs daily, last took it yesterday. Staff has contacted [facility] and has confirmed .  States he has been doing well and has been sober, but did relapse on Cocaine 2 days ago. Admission UDS positive for Cocaine, admission BAL <10   Has been diagnosed with MDD and with PTSD in the past. History of Substance Use Disorder, identifies opiates and cocaine as substances of choice, states he has been doing well on Methadone maintenance . Known to our unit from prior psychiatric admission in October 2018. At the time was admitted due to depression, suicidal ideations, substance abuse , discharged on Buspar, Neurontin, Celexa, Seroquel.  States " they did not really help. The only one that did work was Buspar". . Of note, 5/10 EKG QTc 467.   Medical History remarkable for Hep C , elevated LFTs   Dx- MDD versus Substance Induced Mood Disorder  Substance Use Disorder- Opiate dependence on agonist  therapy, Cocaine Use Disorder  Plan- Inpatient admission. Patient wants to continue Methadone Maintenance and participation in [facility] and dose has been confirmed by staff. Continue Methadone 60 mgrs QDAY  Start Buspar 5 mgrs BID, which he reports has been  effective and well tolerated  Start Zoloft 25 mgrs QDAY - side effects discussed.  Continue Minipress 1 mgr QHS for PTSD related nightmares

## 2017-12-17 NOTE — Progress Notes (Signed)
D.  Pt pleasant on approach, no complaints voiced other than generalized body aches.  Pt was positive for evening wrap up group, observed engaged in appropriate interaction with peers on the unit.  Pt denies SI/HI/AVH at this time.  A.  Support and encouragement offered, medication given as ordered  R.  Pt remains safe on the unit, will continue to monitor.

## 2017-12-17 NOTE — Plan of Care (Signed)
Patient continues to experience symptoms of depression and hopelessness, but participates in group therapy and is working to manage depression.

## 2017-12-17 NOTE — [facility] Group Notes (Signed)
[facility] Group Notes:  (Nursing/MHT/Case Management/Adjunct)  Date:  12/17/2017  Time:  4:41 PM   Type of Therapy:  Psychoeducational Skills  Participation Level:  Minimal  Participation Quality:  Drowsy  Affect:  Flat  Cognitive:  Appropriate and Oriented  Insight:  Improving  Engagement in Group:  Lacking  Modes of Intervention:  Activity and Education  Summary of Progress/Problems: Patient had minimal participation in group session on TIPP distress tolerance skills. Patient was drowsy, but would answer some questions.  Kirstie Mirza 12/17/2017, 4:41 PM

## 2017-12-17 NOTE — Progress Notes (Signed)
[facility] Group Notes:  (Nursing/MHT/Case Management/Adjunct)  Date:  12/17/2017  Time: 2045  Type of Therapy:  wrap up group  Participation Level:  Active  Participation Quality:  Appropriate, Attentive, Sharing and Supportive  Affect:  Flat  Cognitive:  Appropriate  Insight:  Improving  Engagement in Group:  Engaged  Modes of Intervention:  Clarification, Education and Support  Summary of Progress/Problems:  Scott [name] 12/17/2017, 9:33 PM
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
## 2017-12-17 NOTE — [facility] Group Notes (Signed)
[facility] Group Notes:  (Nursing/MHT/Case Management/Adjunct)  Date:  12/17/2017  Time:  9:24 AM  Type of Therapy:  Psychoeducational Skills  Participation Level:  Active  Participation Quality:  Appropriate  Affect:  Appropriate  Cognitive:  Alert  Insight:  Appropriate  Engagement in Group:  Engaged  Modes of Intervention:  Discussion and Education  Summary of Progress/Problems:  Pt attended and participated Orientation/ Psychoeducational group. During this group staff discussed the unit rules and the treatment agreement. Pts then participated in a therapeutic game.      Karren Cobble 12/17/2017, 9:24 AM
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
## 2017-12-17 NOTE — Progress Notes (Signed)
D: Patient presents calm and cooperative and groggy. Denies Sx of withdrawal. Sleep is good, and patient received no sleep medication last night. Appetite is fair, energy is "hyper" and concentration is poor. Depression rated 8/10, hopelessness 9/10, anxiety 7/10. Patient denied SI to RN, but listed on inventory sheet as occurring sometimes. Patient c/o SOB, obtained order for albuterol. A: Patient checked q15 min, and checks reviewed. Reviewed medication changes with patient and educated on side effects. Educated patient on importance of attending group therapy sessions and educated on several coping skills. Encouarged participation in milieu through recreation therapy and attending meals with peers. Provided support and encouragement. R: Patient receptive to education on medications, and is medication compliant. Patient attending all group therapy, recreation time, and cafeteria with peers. Patient contracts for safety on the unit. Goal: "finding more self worth" and meet goal by "go to groups".

## 2017-12-18 DIAGNOSIS — J069 Acute upper respiratory infection, unspecified: Secondary | ICD-10-CM

## 2017-12-18 DIAGNOSIS — F1590 Other stimulant use, unspecified, uncomplicated: Secondary | ICD-10-CM

## 2017-12-18 DIAGNOSIS — Z91411 Personal history of adult psychological abuse: Secondary | ICD-10-CM

## 2017-12-18 MED ORDER — SERTRALINE HCL 50 MG PO TABS
50.0000 mg | ORAL_TABLET | Freq: Every day | ORAL | Status: DC
  Administered 2017-12-19 – 2017-12-22 (×4): 50 mg via ORAL
  Filled 2017-12-18 (×6): qty 1

## 2017-12-18 NOTE — Progress Notes (Signed)
D.  Pt pleasant on approach, compliant of still recovering from pneumonia.  Pt did attend evening AA group, observed interacting appropriately with peers on the unit.  Pt pleased to have hospital bed that raises up now because he normally sleeps sitting up due to COPD.  Pt denies SI/HI/AVH at this time.  A.  Support and encouragement offered, medication given as ordered  R.  Pt remains safe on the unit, will continue to monitor.

## 2017-12-18 NOTE — Progress Notes (Signed)
Pt attended goals and orientation group today. Pt said that his goal for the day is to feel better and to have more self worth. I feels as though he is very hopeless
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
## 2017-12-19 MED ORDER — BUSPIRONE HCL 10 MG PO TABS
10.0000 mg | ORAL_TABLET | Freq: Two times a day (BID) | ORAL | Status: DC
  Administered 2017-12-19 – 2017-12-23 (×8): 10 mg via ORAL
  Filled 2017-12-19 (×3): qty 1
  Filled 2017-12-19: qty 2
  Filled 2017-12-19 (×3): qty 1
  Filled 2017-12-19: qty 2
  Filled 2017-12-19: qty 1
  Filled 2017-12-19: qty 2
  Filled 2017-12-19 (×3): qty 1
  Filled 2017-12-19 (×2): qty 2
  Filled 2017-12-19 (×2): qty 1

## 2017-12-19 NOTE — Progress Notes (Addendum)
[facility] MD Progress Note  12/19/2017 10:30 AM Scott [name]  MRN:  [id number] Subjective:  "I feel really worthless.  I have lost hope, nothing ever works out for me."  He is concerned about where he will go when he discharges as his wife wants a divorce.  During this evaluation, patient is alert and oriented x4 and cooperative.Patient isshowing a little improvement in moodand with a 7/10 depression with passive  suicidal ideations, no plan or intent.  "High" anxiety at this time, concerned about his marital issues with his wife wanting a divorce.  They have three children:  6, 6, and 9 yo boys.  Scott [name] has no close families or friends to turn to.   He is currently on disability for his mental health and chronic spinal issues.  Scott [name] was positive for cocaine and explained that despite the methadone being restarted by a provider that it would not be in the future as this is contraindicated with methadone use.  Educated him on having to be drug free except for what was prescribed for him or he would lose the right to take methadone in the future.  He minimizes his cocaine use with "I just used a little bit."  Again, reinforced the need to stay clean if he wants the methadone clinic to continue his methadone treatment.  Scott [name] also reports going to [facility] for substance abuse.Sleep and appetite are "fair". He is active in group sessionsand engages easily with his peers. Scott [name] any HI or AVH and does not appear to be responding to internal stimuli. He has remained free fromself harmingbehaviors on the unit. Denies somatic complaints or acute pain. At this time, he is contracting for safety on the unit.  Continued Zoloft for depression, methadone for substance dependence, and Prazosin for nightmares, denies side effects and withdrawal symptoms, monitoring continues.  Will increase Buspar for for anxiety   Principal Problem: MDD (major depressive disorder), severe ([place]) Diagnosis:   Patient  Active Problem List   Diagnosis Date Noted  . MDD (major depressive disorder), severe ([place]) [F32.2] 12/16/2017    Priority: High  . Cocaine use disorder, mild, abuse ([place]) [F14.10] 07/24/2015    Priority: High  . Alcohol use disorder, severe, dependence ([place]) [F10.20] 07/24/2015    Priority: Low  . Schizoaffective disorder ([place]) [F25.9] 06/07/2017  . Alcohol withdrawal ([place]) [F10.239] 07/24/2015  . Substance induced mood disorder ([place]) [F19.94] 07/22/2015  . Drug-seeking behavior [Z76.5] 02/16/2015  . PTSD (post-traumatic stress disorder) [F43.10] 12/26/2014  . Opioid type dependence, continuous ([place]) [F11.20] 12/26/2014  . Major depressive disorder, recurrent episode, moderate ([facility]) [F33.1]   . Chest pain at rest [R07.9] 10/22/2013  . Chronic pain syndrome [G89.4] 10/22/2013  . Seizure disorder ([place]) [J24.268] 10/22/2013  . Chest pain [R07.9] 10/21/2013   Total Time spent with patient: 30 minutes  Past Psychiatric History: PTSD, depression, anxiety, substance abuse  Past Medical History:  Past Medical History:  Diagnosis Date  . Accidental heroin overdose ([place])   . Anxiety   . Back pain   . Bipolar 1 disorder ([place])   . Chronic back pain   . Depression   . Drug-seeking behavior   . GERD (gastroesophageal reflux disease)   . Heroin abuse ([place])   . History of ETOH abuse   . Myocardial infarction [facility])    Pt says that the doctor determined it was panic attacks  . Opioid dependence ([place])   . PTSD (post-traumatic stress disorder)   . Seizures ([place])    alcohol  induced- pt reports sober for 6 months    Past Surgical History:  Procedure Laterality Date  . ABDOMINAL SURGERY    . APPENDECTOMY    . BACK SURGERY     Family History:  Family History  Problem Relation Age of Onset  . Hypertension Mother   . CAD Father   . COPD Father   . Stroke Father   . Testicular cancer Brother    Family Psychiatric  History: NOne Social History:  Social History   Substance and  Sexual Activity  Alcohol Use Not Currently   Comment: Pt reports sober for 6 months     Social History   Substance and Sexual Activity  Drug Use Yes  . Types: Cocaine, Methamphetamines, Marijuana, IV   Comment: Pt says not sure why positive for cocaine    Social History   Socioeconomic History  . Marital status: Married    Spouse name: Not on file  . Number of children: Not on file  . Years of education: Not on file  . Highest education level: Not on file  Occupational History  . Not on file  Social Needs  . Financial resource strain: Not on file  . Food insecurity:    Worry: Not on file    Inability: Not on file  . Transportation needs:    Medical: Not on file    Non-medical: Not on file  Tobacco Use  . Smoking status: Current Every Day Smoker    Packs/day: 1.50    Types: Cigarettes  . Smokeless tobacco: Never Used  Substance and Sexual Activity  . Alcohol use: Not Currently    Comment: Pt reports sober for 6 months  . Drug use: Yes    Types: Cocaine, Methamphetamines, Marijuana, IV    Comment: Pt says not sure why positive for cocaine  . Sexual activity: Yes    Birth control/protection: None    Comment: heroin  Lifestyle  . Physical activity:    Days per week: Not on file    Minutes per session: Not on file  . Stress: Not on file  Relationships  . Social connections:    Talks on phone: Not on file    Gets together: Not on file    Attends religious service: Not on file    Active member of club or organization: Not on file    Attends meetings of clubs or organizations: Not on file    Relationship status: Not on file  Other Topics Concern  . Not on file  Social History Narrative  . Not on file   Additional Social History:    Pain Medications: See home med list Prescriptions: See home med list Over the Counter: See home med list History of alcohol / drug use?: Yes Longest period of sobriety (when/how long): Pt reports 6 mon sober from alcohol Negative  Consequences of Use: Financial, Personal relationships, Work / School   Name of Substance 2: cocaine 2 - Age of First Use: 37 2 - Amount (size/oz): "just a little" 2 - Frequency: occasionally 2 - Duration: ongoing 2 - Last Use / Amount: 12/15/17 Name of Substance 3: THC 3 - Age of First Use: 29 3 - Amount (size/oz): varies 3 - Frequency: occasionally 3 - Duration: ongoing 3 - Last Use / Amount: unsure              Sleep: Fair  Appetite:  Fair  Current Medications: Current Facility-Administered Medications  Medication Dose Route Frequency Provider Last Rate  Last Dose  . acetaminophen (TYLENOL) tablet 650 mg  650 mg Oral Q6H PRN Money, Lowry Ram, FNP      . albuterol (PROVENTIL HFA;VENTOLIN HFA) 108 (90 Base) MCG/ACT inhaler 1-2 puff  1-2 puff Inhalation Q4H PRN Derrill Center, NP   2 puff at 12/18/17 1824  . busPIRone (BUSPAR) tablet 5 mg  5 mg Oral BID Cobos, Myer Peer, MD   5 mg at 12/19/17 0752  . dicyclomine (BENTYL) tablet 20 mg  20 mg Oral Q6H PRN Money, Lowry Ram, FNP      . guaiFENesin (MUCINEX) 12 hr tablet 600 mg  600 mg Oral BID Money, Lowry Ram, FNP   600 mg at 12/19/17 0752  . loperamide (IMODIUM) capsule 2-4 mg  2-4 mg Oral PRN Money, Lowry Ram, FNP      . magnesium hydroxide (MILK OF MAGNESIA) suspension 30 mL  30 mL Oral Daily PRN Money, Darnelle Maffucci B, FNP      . methadone (DOLOPHINE) tablet 60 mg  60 mg Oral Daily Cobos, Myer Peer, MD   60 mg at 12/19/17 0752  . methocarbamol (ROBAXIN) tablet 500 mg  500 mg Oral Q8H PRN Money, Lowry Ram, FNP   500 mg at 12/19/17 0756  . nicotine (NICODERM CQ - dosed in mg/24 hours) patch 21 mg  21 mg Transdermal Daily Nanci Pina, FNP   21 mg at 12/19/17 0753  . ondansetron (ZOFRAN-ODT) disintegrating tablet 4 mg  4 mg Oral Q6H PRN Money, Lowry Ram, FNP      . pantoprazole (PROTONIX) EC tablet 40 mg  40 mg Oral Daily Nanci Pina, FNP   40 mg at 12/19/17 0752  . pneumococcal 23 valent vaccine (PNU-IMMUNE) injection 0.5 mL  0.5  mL Intramuscular Tomorrow-1000 Sharma Covert, MD      . prazosin (MINIPRESS) capsule 2 mg  2 mg Oral QHS Dara Hoyer, PA-C   2 mg at 12/18/17 2149  . sertraline (ZOLOFT) tablet 50 mg  50 mg Oral Daily Derrill Center, NP   50 mg at 12/19/17 7062    Lab Results: No results found for this or any previous visit (from the past 48 hour(s)).  Blood Alcohol level:  Lab Results  Component Value Date   ETH <10 12/16/2017   ETH <10 37/62/8315    Metabolic Disorder Labs: No results found for: HGBA1C, MPG No results found for: PROLACTIN No results found for: CHOL, TRIG, HDL, CHOLHDL, VLDL, LDLCALC  Physical Findings: AIMS: Facial and Oral Movements Muscles of Facial Expression: None, normal Lips and Perioral Area: None, normal Jaw: None, normal Tongue: None, normal,Extremity Movements Upper (arms, wrists, hands, fingers): None, normal Lower (legs, knees, ankles, toes): None, normal, Trunk Movements Neck, shoulders, hips: None, normal, Overall Severity Severity of abnormal movements (highest score from questions above): None, normal Incapacitation due to abnormal movements: None, normal Patient's awareness of abnormal movements (rate only patient's report): No Awareness, Dental Status Current problems with teeth and/or dentures?: No Does patient usually wear dentures?: No  CIWA:  CIWA-Ar Total: 0 COWS:  COWS Total Score: 3  Musculoskeletal: Strength & Muscle Tone: within normal limits Gait & Station: normal Patient leans: N/A  Psychiatric Specialty Exam: Physical Exam  Constitutional: He is oriented to person, place, and time. He appears well-developed and well-nourished.  HENT:  Head: Normocephalic.  Neck: Normal range of motion.  Respiratory: Effort normal.  Musculoskeletal: Normal range of motion.  Neurological: He is alert and oriented to person, place, and  time.  Psychiatric: His speech is normal and behavior is normal. Judgment normal. His mood appears anxious.  Cognition and memory are normal. He exhibits a depressed mood. He expresses suicidal ideation.    Review of Systems  Psychiatric/Behavioral: Positive for depression, substance abuse and suicidal ideas. The patient is nervous/anxious.   All other systems reviewed and are negative.   Blood pressure 120/72, pulse 90, temperature 98.6 F (37 C), temperature source Oral, resp. rate 20, height '5\' 10"'  (1.778 m), weight 121.1 kg (267 lb), SpO2 94 %.Body mass index is 38.31 kg/m.  General Appearance: Casual  Eye Contact:  Good  Speech:  Normal Rate  Volume:  Normal  Mood:  Anxious and Depressed  Affect:  Congruent  Thought Process:  Coherent and Descriptions of Associations: Intact  Orientation:  Full (Time, Place, and Person)  Thought Content:  Rumination  Suicidal Thoughts:  No  Homicidal Thoughts:  No  Memory:  Immediate;   Fair Recent;   Fair Remote;   Fair  Judgement:  Fair  Insight:  Fair  Psychomotor Activity:  Decreased  Concentration:  Concentration: Fair and Attention Span: Fair  Recall:  [employer] of Knowledge:  Fair  Language:  Good  Akathisia:  No  Handed:  Right  AIMS (if indicated):     Assets:  Leisure Time Physical Health Resilience Social Support  ADL's:  Intact  Cognition:  WNL  Sleep:  Number of Hours: 5.5   Treatment Plan Summary:Reviewed current treatment plan, Will continue the following plan without adjustment at this time. Daily contact with patient to assess and evaluate symptoms and progress in treatment  Major depressive disorder, recurrent, severe without psychosis:  Medication: To reduce current symptoms to base line and improve the patient's overall level of functioning will continue Medication management as follows: -Continue Zoloft 50 mg daily for depression  Opiate dependence: -Continue Methadone 60 mg daily for opiate dependence  Anxiety --Increased Buspar 5 mg BID to 10 mg for anxiety   Nightmares: -Continue Prazosin 2 mg at  bedtime for nightmares  Plan: 1. Patient admitted to the adult unit at [facility] under the service of Dr. Mallie Darting 2. Routine labs: UDS negative for alcohol, positive for cocaine.  Glucose, acetaminophen, and salicylate levels are WDL.  CBC & Diff WNL.  Met B WDL except  Low calcium of 8.8 and albumin 3.3, AST and ALT elevated at 243 and 346 respectively 3. Will maintain Q 15 minutes observation for safety.Estimated LOS: 5-7 days  4. During this hospitalization the patient will receive a psychosocialassessment. 5. Patient will participate ingroup, milieu, and family therapy.Psychotherapy: Social and Airline pilot, anti-bullying, learning based strategies, cognitive behavioral, and family object relations individuation separation intervention psychotherapies can be considered. 6. Will continue to monitor patient's mood and behavior. 7. Social Work willschedule a Family meeting to obtain collateral information and discuss discharge and follow up plan.Discharge concerns will also be addressed: Safety, stabilization, and access to medication 8. Discharge date to be determined  Waylan Boga, NP 12/19/2017, 10:30 AM

## 2017-12-19 NOTE — Progress Notes (Signed)
Late entry for 12/18/17 1700    D Pt is observed OOB UAL on the 300 hall today. He is socializing with his peers. He coughs frequently and says " I guess I'm getting better". He wears hospital-issue patient scrubs. HE takes his scheduled meds as planned. HEsayss " I gotta do better ".      A He completed his daily assessment and on this he wrote he has had feelings of SI but he contracts with this writer to not hurt himslef. He rates his feelings of depression, hopelessness and axneity " 8/9/7", respectively.     R Safety is in place.

## 2017-12-19 NOTE — Progress Notes (Signed)
Nursing Progress Note: 7p-7a D: Pt currently presents with a pleasant/appropriate affect and behavior. Pt states "I am feeling much better." Interacting appropriately with the milieu. Pt reports good sleep during the previous night with current medication regimen. Pt did attend wrap-up group.  A: Pt provided with medications per providers orders. Pt's labs and vitals were monitored throughout the night. Pt supported emotionally and encouraged to express concerns and questions. Pt educated on medications.  R: Pt's safety ensured with 15 minute and environmental checks. Pt currently denies SI, HI, and AVH. Pt verbally contracts to seek staff if SI,HI, or AVH occurs and to consult with staff before acting on any harmful thoughts. Will continue to monitor.

## 2017-12-19 NOTE — [facility] Group Notes (Signed)
LCSW Group Therapy Note   12/19/2017 1:15pm   Type of Therapy and Topic:  Group Therapy:  Overcoming Obstacles   Participation Level:  Active   Description of Group:    In this group patients will be encouraged to explore what they see as obstacles to their own wellness and recovery. They will be guided to discuss their thoughts, feelings, and behaviors related to these obstacles. The group will process together ways to cope with barriers, with attention given to specific choices patients can make. Each patient will be challenged to identify changes they are motivated to make in order to overcome their obstacles. This group will be process-oriented, with patients participating in exploration of their own experiences as well as giving and receiving support and challenge from other group members.   Therapeutic Goals: 1. Patient will identify personal and current obstacles as they relate to admission. 2. Patient will identify barriers that currently interfere with their wellness or overcoming obstacles.  3. Patient will identify feelings, thought process and behaviors related to these barriers. 4. Patient will identify two changes they are willing to make to overcome these obstacles:      Summary of Patient Progress  Scott [name] was attentive and engaged during today's processing group. He shared that he still struggles with guilt and SI thoughts although he is feeling more hopeful about the future. Scott [name] hopes to get into [facility] visitor house at discharge and resume methadone maintenance and [facility] at discharge.     Therapeutic Modalities:   Cognitive Behavioral Therapy Solution Focused Therapy Motivational Interviewing Relapse Prevention Therapy  Ledell Peoples Smart, LCSW 12/19/2017 10:30 AM

## 2017-12-19 NOTE — Progress Notes (Signed)
Patient did attend the evening speaker AA meeting.  

## 2017-12-19 NOTE — Progress Notes (Signed)
Patient ID: Scott [name], male   DOB: [birth year], 47 y.o.   MRN: [id number]  Nursing Progress Note [id number]  Data: Patient presents with animated affect and pleasant mood. Patient has a non-productive cough and occasional shortness of breath relieved with [place] of his Albuterol inhaler. Patient complaint with all scheduled medications. Patient denies SI/HI/AVH or pain. Patient contracts for safety on the unit at this time. Patient completed self-inventory sheet and rates depression, hopelessness, and anxiety 8,7,8 respectively. Patient rates their sleep and appetite as fair/fair respectively. Patient states goal for today is to "work on my self pride" and "go to groups". Patient is seen attending groups and visible in the milieu.  Action: Patient educated about and provided medication per provider's orders. Patient safety maintained with q15 min safety checks. Low fall risk precautions in place. Emotional support given. 1:1 interaction and active listening provided. Patient encouraged to attend meals and groups. Labs, vital signs and patient behavior monitored throughout shift. Patient encouraged to work on treatment plan and goals.  Response: Patient remains safe on the unit at this time. Patient is interacting with peers appropriately on the unit. Will continue to support and monitor.

## 2017-12-19 NOTE — Progress Notes (Signed)
Pt attended goals and orientation group this morning. Pt participated and said that his goal for the day is to stay awake. He also said that he wants to gain self worth. He said that talking to his wife is helping a lot through his healing process

## 2017-12-19 NOTE — Progress Notes (Signed)
Adult Psychoeducational Group Note  Date:  12/19/2017 Time:  9:10 PM  Group Topic/Focus:  Wrap-Up Group:   The focus of this group is to help patients review their daily goal of treatment and discuss progress on daily workbooks.  Participation Level:  Active  Participation Quality:  Appropriate  Affect:  Appropriate  Cognitive:  Alert  Insight: Appropriate  Engagement in Group:  Engaged  Modes of Intervention:  Discussion  Additional Comments:  Patient stated having an alright day. Patient's goal for today was to talk to the counselor. Patient met goal.  Wilma Michaelson L Suzane Vanderweide 12/19/2017, 9:10 PM

## 2017-12-19 NOTE — Tx Team (Signed)
Interdisciplinary Treatment and Diagnostic Plan Update  12/19/2017 Time of Session: 0830AM Kindrick Lankford MRN: [id number]  Principal Diagnosis: MDD (major depressive disorder), severe (HCC)  Secondary Diagnoses: Principal Problem:   MDD (major depressive disorder), severe (HCC)   Current Medications:  Current Facility-Administered Medications  Medication Dose Route Frequency Provider Last Rate Last Dose  . acetaminophen (TYLENOL) tablet 650 mg  650 mg Oral Q6H PRN Money, Gerlene Burdock, FNP      . albuterol (PROVENTIL HFA;VENTOLIN HFA) 108 (90 Base) MCG/ACT inhaler 1-2 puff  1-2 puff Inhalation Q4H PRN Oneta Rack, NP   2 puff at 12/18/17 1824  . busPIRone (BUSPAR) tablet 5 mg  5 mg Oral BID Cobos, Rockey Situ, MD   5 mg at 12/19/17 0752  . dicyclomine (BENTYL) tablet 20 mg  20 mg Oral Q6H PRN Money, Gerlene Burdock, FNP      . guaiFENesin (MUCINEX) 12 hr tablet 600 mg  600 mg Oral BID Money, Gerlene Burdock, FNP   600 mg at 12/19/17 0752  . loperamide (IMODIUM) capsule 2-4 mg  2-4 mg Oral PRN Money, Gerlene Burdock, FNP      . magnesium hydroxide (MILK OF MAGNESIA) suspension 30 mL  30 mL Oral Daily PRN Money, Feliz Beam B, FNP      . methadone (DOLOPHINE) tablet 60 mg  60 mg Oral Daily Cobos, Rockey Situ, MD   60 mg at 12/19/17 0752  . methocarbamol (ROBAXIN) tablet 500 mg  500 mg Oral Q8H PRN Money, Gerlene Burdock, FNP   500 mg at 12/19/17 0756  . nicotine (NICODERM CQ - dosed in mg/24 hours) patch 21 mg  21 mg Transdermal Daily Truman Hayward, FNP   21 mg at 12/19/17 0753  . ondansetron (ZOFRAN-ODT) disintegrating tablet 4 mg  4 mg Oral Q6H PRN Money, Gerlene Burdock, FNP      . pantoprazole (PROTONIX) EC tablet 40 mg  40 mg Oral Daily Truman Hayward, FNP   40 mg at 12/19/17 0752  . pneumococcal 23 valent vaccine (PNU-IMMUNE) injection 0.5 mL  0.5 mL Intramuscular Tomorrow-1000 Antonieta Pert, MD      . prazosin (MINIPRESS) capsule 2 mg  2 mg Oral QHS Court Joy, PA-C   2 mg at 12/18/17 2149  . sertraline (ZOLOFT)  tablet 50 mg  50 mg Oral Daily Oneta Rack, NP   50 mg at 12/19/17 8657   PTA Medications: Medications Prior to Admission  Medication Sig Dispense Refill Last Dose  . albuterol (PROVENTIL HFA;VENTOLIN HFA) 108 (90 Base) MCG/ACT inhaler Inhale 2 puffs into the lungs 4 (four) times daily as needed for wheezing or shortness of breath.   0 12/16/2017 at noon  . hydrOXYzine (ATARAX/VISTARIL) 25 MG tablet Take 1 tablet (25 mg total) by mouth every 6 (six) hours as needed for anxiety. (Patient not taking: Reported on 12/16/2017) 30 tablet 0 Not Taking at Unknown time  . METHADONE HCL PO Take 60 mg by mouth daily.   12/16/2017 at 930  . Multiple Vitamin (MULTIVITAMIN WITH MINERALS) TABS tablet Take 1 tablet by mouth daily. 30 tablet 0 12/16/2017 at am  . nicotine (NICODERM CQ - DOSED IN MG/24 HOURS) 21 mg/24hr patch Place 1 patch (21 mg total) onto the skin daily. 28 patch 0 12/15/2017 at Unknown time  . omeprazole (PRILOSEC) 40 MG capsule Take 40 mg by mouth at bedtime.  0 12/15/2017 at pm  . prazosin (MINIPRESS) 2 MG capsule Take 2 mg by mouth at bedtime. For  night terrors  0 12/15/2017 at pm  . QUEtiapine (SEROQUEL) 25 MG tablet Take 1 tablet (25 mg total) by mouth at bedtime. (Patient not taking: Reported on 12/16/2017) 30 tablet 0 Not Taking at Unknown time  . traMADol (ULTRAM) 50 MG tablet Take 50 mg by mouth every 6 (six) hours as needed.  0 couple days ago    Patient Stressors: Financial difficulties Health problems Marital or family conflict Substance abuse  Patient Strengths: Ability for insight Average or above average intelligence Capable of independent living Wellsite geologist fund of knowledge Motivation for treatment/growth  Treatment Modalities: Medication Management, Group therapy, Case management,  1 to 1 session with clinician, Psychoeducation, Recreational therapy.   Physician Treatment Plan for Primary Diagnosis: MDD (major depressive disorder), severe (HCC) Long  Term Goal(s): Improvement in symptoms so as ready for discharge Improvement in symptoms so as ready for discharge   Short Term Goals: Ability to identify changes in lifestyle to reduce recurrence of condition will improve Ability to disclose and discuss suicidal ideas Ability to maintain clinical measurements within normal limits will improve Ability to identify triggers associated with substance abuse/mental health issues will improve Ability to verbalize feelings will improve Ability to disclose and discuss suicidal ideas Ability to identify and develop effective coping behaviors will improve  Medication Management: Evaluate patient's response, side effects, and tolerance of medication regimen.  Therapeutic Interventions: 1 to 1 sessions, Unit Group sessions and Medication administration.  Evaluation of Outcomes: Progressing  Physician Treatment Plan for Secondary Diagnosis: Principal Problem:   MDD (major depressive disorder), severe (HCC)  Long Term Goal(s): Improvement in symptoms so as ready for discharge Improvement in symptoms so as ready for discharge   Short Term Goals: Ability to identify changes in lifestyle to reduce recurrence of condition will improve Ability to disclose and discuss suicidal ideas Ability to maintain clinical measurements within normal limits will improve Ability to identify triggers associated with substance abuse/mental health issues will improve Ability to verbalize feelings will improve Ability to disclose and discuss suicidal ideas Ability to identify and develop effective coping behaviors will improve     Medication Management: Evaluate patient's response, side effects, and tolerance of medication regimen.  Therapeutic Interventions: 1 to 1 sessions, Unit Group sessions and Medication administration.  Evaluation of Outcomes: Progressing   RN Treatment Plan for Primary Diagnosis: MDD (major depressive disorder), severe (HCC) Long Term  Goal(s): Knowledge of disease and therapeutic regimen to maintain health will improve  Short Term Goals: Ability to remain free from injury will improve, Ability to disclose and discuss suicidal ideas and Ability to identify and develop effective coping behaviors will improve  Medication Management: RN will administer medications as ordered by provider, will assess and evaluate patient's response and provide education to patient for prescribed medication. RN will report any adverse and/or side effects to prescribing provider.  Therapeutic Interventions: 1 on 1 counseling sessions, Psychoeducation, Medication administration, Evaluate responses to treatment, Monitor vital signs and CBGs as ordered, Perform/monitor CIWA, COWS, AIMS and Fall Risk screenings as ordered, Perform wound care treatments as ordered.  Evaluation of Outcomes: Progressing   LCSW Treatment Plan for Primary Diagnosis: MDD (major depressive disorder), severe (HCC) Long Term Goal(s): Safe transition to appropriate next level of care at discharge, Engage patient in therapeutic group addressing interpersonal concerns.  Short Term Goals: Engage patient in aftercare planning with referrals and resources, Facilitate patient progression through stages of change regarding substance use diagnoses and concerns and Identify triggers associated with mental  health/substance abuse issues  Therapeutic Interventions: Assess for all discharge needs, 1 to 1 time with Social worker, Explore available resources and support systems, Assess for adequacy in community support network, Educate family and significant other(s) on suicide prevention, Complete Psychosocial Assessment, Interpersonal group therapy.  Evaluation of Outcomes: Progressing   Progress in Treatment: Attending groups: Yes. Participating in groups: Yes. Taking medication as prescribed: Yes. Toleration medication: Yes. Family/Significant other contact made: SPE completed with pt;  pt declined to consent to family contact.  Patient understands diagnosis: Yes. Discussing patient identified problems/goals with staff: Yes. Medical problems stabilized or resolved: Yes. Denies suicidal/homicidal ideation: Yes. Issues/concerns per patient self-inventory: Yes. Other: n/a   New problem(s) identified: No, Describe:  n/a  New Short Term/Long Term Goal(s): detox, medication management for mood stabilization; elimination of SI thoughts; development of comprehensive mental wellness/sobriety plan.   Patient Goal: "to get into a halfway house when I leave the hosptial and stay off cocaine."  Discharge Plan or Barriers:  CSW assessing for appropriate referrals. ADS for methadone maintenance and SAIOP. Pt provided with MHAG pamphlet and AA/NA information for additional community support.   Reason for Continuation of Hospitalization: Anxiety Depression Medication stabilization Withdrawal symptoms  Estimated Length of Stay: Tuesday, 12/20/17  Attendees: Patient: Scott [name] 12/19/2017 8:43 AM  Physician: Dr. Jola Babinski MD; Dr. Altamese Woodway MD 12/19/2017 8:43 AM  Nursing: Marchelle Folks RN 12/19/2017 8:43 AM  RN Care Manager:x 12/19/2017 8:43 AM  Social Worker: Chartered loss adjuster, LCSW 12/19/2017 8:43 AM  Recreational Therapist: x 12/19/2017 8:43 AM  Other: Armandina Stammer NP; Hillery Jacks NP; Nanine Means NP 12/19/2017 8:43 AM  Other:  12/19/2017 8:43 AM  Other: 12/19/2017 8:43 AM    Scribe for Treatment Team: Ledell Peoples Smart, LCSW 12/19/2017 8:43 AM

## 2017-12-20 LAB — COMPREHENSIVE METABOLIC PANEL
ALT: 360 U/L — ABNORMAL HIGH (ref 17–63)
AST: 239 U/L — ABNORMAL HIGH (ref 15–41)
Albumin: 3.4 g/dL — ABNORMAL LOW (ref 3.5–5.0)
Alkaline Phosphatase: 113 U/L (ref 38–126)
Anion gap: 9 (ref 5–15)
BUN: 13 mg/dL (ref 6–20)
CO2: 26 mmol/L (ref 22–32)
Calcium: 8.6 mg/dL — ABNORMAL LOW (ref 8.9–10.3)
Chloride: 104 mmol/L (ref 101–111)
Creatinine, Ser: 0.63 mg/dL (ref 0.61–1.24)
GFR calc Af Amer: >60 mL/min (ref >60)
GFR calc non Af Amer: >60 mL/min (ref >60)
Glucose, Bld: 94 mg/dL (ref 65–99)
Potassium: 4.1 mmol/L (ref 3.5–5.1)
Sodium: 139 mmol/L (ref 135–145)
Total Bilirubin: 0.8 mg/dL (ref 0.3–1.2)
Total Protein: 7 g/dL (ref 6.5–8.1)

## 2017-12-20 LAB — APTT: aPTT: 33 seconds (ref 24–36)

## 2017-12-20 NOTE — Plan of Care (Signed)
Pt is progressing in these metrics. Pt contracts for safety. Will continue to monitor.

## 2017-12-20 NOTE — [facility] Group Notes (Signed)
[facility] Mental Health Association Group Therapy 12/20/2017 10:30AM  Type of Therapy: Mental Health Association Presentation  Participation Level: Active  Participation Quality: Attentive  Affect: Appropriate  Cognitive: Oriented  Insight: Developing/Improving  Engagement in Therapy: Engaged  Modes of Intervention: Discussion, Education and Socialization  Summary of Progress/Problems: CSW provided pt and group with MHAG information and other community resources The pt processed ways by which to utilize these services. CSW provided handouts and educational information pertaining to groups and services offered by the [facility]. Pt was engaged in presentation and was receptive to resources provided.  Pt also states that is is worried about discharging tomorrow and states that his depression is still high. He is aware that Winferd Humphrey from [facility] will be meeting with him today to discuss possible halfway house placement as an option at discharge.   [employer], LCSW 12/20/2017 11:58 AM

## 2017-12-20 NOTE — Progress Notes (Signed)
D: pt found in dayroom watching tv. Pt denies any si/hi. Pt states his goal for today is to be more positive and to not feel so worthless. Pt rates his depression/hopelessness/anxiety an 8/7/6 respectively out of 10. Pt stated he didn't sleep well last night.  A: pt provided medications per orders and protocol. Pt given support and encouragement to help achieve their goal. Q42m checks implemented and continued. R: pt safe on the unit. Pt contracts for safety. Will continue to monitor.

## 2017-12-20 NOTE — [facility] Group Notes (Signed)
Adult Psychoeducational Group Note  Date:  12/20/2017 Time:  1600  Group Topic/Focus: Mindfulness, Support Systems and Recovery Recovery Goals:   The focus of this group is to identify appropriate goals for recovery and establish a plan to achieve them.  Participation Level:  Active  Participation Quality:  Appropriate  Affect:  Appropriate  Cognitive:  Alert and Oriented  Insight: Improving  Engagement in Group:  Engaged and Supportive  Modes of Intervention:  Discussion, Education, Socialization and Support  Additional Comments:  Patient was engaged in group and supportive of peers. Patient demonstrated fair insight. Patient talked of his recent relapses and the benefits of long term treatment. Patient reports recovery to him means making good choices.  Marchelle Folks A Christella App 12/20/2017, 6:18 PM

## 2017-12-20 NOTE — Progress Notes (Signed)
Recreation Therapy Notes  Animal-Assisted Activity (AAA) Program Checklist/Progress Notes Patient Eligibility Criteria Checklist & Daily Group note for Rec Tx Intervention  Date: 5.14.19 Time: 1430 Location: [address]   AAA/T Program Assumption of Risk Form signed by Engineer, production or Parent Legal Guardian YES   Patient is free of allergies or sever asthma NO  Patient reports no fear of animals YES   Patient reports no history of cruelty to animals YES   Patient understands his/her participation is voluntary YES   Patient washes hands before animal contact YES   Patient washes hands after animal contact YES   Education: Charity fundraiser, Appropriate Animal Interaction   Education Outcome: Acknowledges understanding/In group clarification offered/Needs additional education.   Clinical Observations/Feedback: Pt did not attend group.    Caroll Rancher, LRT/CTRS         Caroll Rancher A 12/20/2017 3:55 PM

## 2017-12-20 NOTE — Progress Notes (Addendum)
Patient ID: Scott [name], male   DOB: Jul 03, 1971, 47 y.o.   MRN: [id number] [facility] MD Progress Note  12/20/2017 9:38 AM Scott [name]  MRN:  [id number] Subjective:  "I feel better today."  6/10 depression and 2/10 anxiety with no suicidal ideations.  Discussed his going to [place], halfway house, and he "think it is a good idea."  During this evaluation, patient is alert and oriented x4 and cooperative.Patient isshowing in improvement in moodand with a 6/10 depression with no suicidal ideationst.  2/10 anxiety at this time.  Appears hopeful that he now has a place to go and live.  Sleep was "up and down" due to his cough, treatment in place.  Appetite is "good."He is active in group sessionsand engages easily with his peers. Nelsondenies any HI or AVH and does not appear to be responding to internal stimuli. He has remained free fromself harmingbehaviors on the unit. Denies somatic complaints or acute pain. At this time, he is contracting for safety on the unit.  Continued Zoloft for depression, methadone for substance dependence, and Prazosin for nightmares, denies side effects and withdrawal symptoms, monitoring continues.  Increased Buspar for for anxiety with less noted today.  Principal Problem: MDD (major depressive disorder), severe ([place]) Diagnosis:   Patient Active Problem List   Diagnosis Date Noted  . MDD (major depressive disorder), severe ([place]) [F32.2] 12/16/2017    Priority: High  . Cocaine use disorder, mild, abuse ([place]) [F14.10] 07/24/2015    Priority: High  . Alcohol use disorder, severe, dependence ([place]) [F10.20] 07/24/2015    Priority: Low  . Schizoaffective disorder ([place]) [F25.9] 06/07/2017  . Alcohol withdrawal ([place]) [F10.239] 07/24/2015  . Substance induced mood disorder ([place]) [F19.94] 07/22/2015  . Drug-seeking behavior [Z76.5] 02/16/2015  . PTSD (post-traumatic stress disorder) [F43.10] 12/26/2014  . Opioid type dependence, continuous ([place]) [F11.20]  12/26/2014  . Major depressive disorder, recurrent episode, moderate ([facility]) [F33.1]   . Chest pain at rest [R07.9] 10/22/2013  . Chronic pain syndrome [G89.4] 10/22/2013  . Seizure disorder ([place]) [Y81.448] 10/22/2013  . Chest pain [R07.9] 10/21/2013   Total Time spent with patient: 30 minutes  Past Psychiatric History: PTSD, depression, anxiety, substance abuse  Past Medical History:  Past Medical History:  Diagnosis Date  . Accidental heroin overdose ([place])   . Anxiety   . Back pain   . Bipolar 1 disorder ([place])   . Chronic back pain   . Depression   . Drug-seeking behavior   . GERD (gastroesophageal reflux disease)   . Heroin abuse ([place])   . History of ETOH abuse   . Myocardial infarction [facility])    Pt says that the doctor determined it was panic attacks  . Opioid dependence ([place])   . PTSD (post-traumatic stress disorder)   . Seizures ([place])    alcohol induced- pt reports sober for 6 months    Past Surgical History:  Procedure Laterality Date  . ABDOMINAL SURGERY    . APPENDECTOMY    . BACK SURGERY     Family History:  Family History  Problem Relation Age of Onset  . Hypertension Mother   . CAD Father   . COPD Father   . Stroke Father   . Testicular cancer Brother    Family Psychiatric  History: NOne Social History:  Social History   Substance and Sexual Activity  Alcohol Use Not Currently   Comment: Pt reports sober for 6 months     Social History   Substance and Sexual  Activity  Drug Use Yes  . Types: Cocaine, Methamphetamines, Marijuana, IV   Comment: Pt says not sure why positive for cocaine    Social History   Socioeconomic History  . Marital status: Married    Spouse name: Not on file  . Number of children: Not on file  . Years of education: Not on file  . Highest education level: Not on file  Occupational History  . Not on file  Social Needs  . Financial resource strain: Not on file  . Food insecurity:    Worry: Not on file    Inability:  Not on file  . Transportation needs:    Medical: Not on file    Non-medical: Not on file  Tobacco Use  . Smoking status: Current Every Day Smoker    Packs/day: 1.50    Types: Cigarettes  . Smokeless tobacco: Never Used  Substance and Sexual Activity  . Alcohol use: Not Currently    Comment: Pt reports sober for 6 months  . Drug use: Yes    Types: Cocaine, Methamphetamines, Marijuana, IV    Comment: Pt says not sure why positive for cocaine  . Sexual activity: Yes    Birth control/protection: None    Comment: heroin  Lifestyle  . Physical activity:    Days per week: Not on file    Minutes per session: Not on file  . Stress: Not on file  Relationships  . Social connections:    Talks on phone: Not on file    Gets together: Not on file    Attends religious service: Not on file    Active member of club or organization: Not on file    Attends meetings of clubs or organizations: Not on file    Relationship status: Not on file  Other Topics Concern  . Not on file  Social History Narrative  . Not on file   Additional Social History:    Pain Medications: See home med list Prescriptions: See home med list Over the Counter: See home med list History of alcohol / drug use?: Yes Longest period of sobriety (when/how long): Pt reports 6 mon sober from alcohol Negative Consequences of Use: Financial, Personal relationships, Work / School   Name of Substance 2: cocaine 2 - Age of First Use: 37 2 - Amount (size/oz): "just a little" 2 - Frequency: occasionally 2 - Duration: ongoing 2 - Last Use / Amount: 12/15/17 Name of Substance 3: THC 3 - Age of First Use: 29 3 - Amount (size/oz): varies 3 - Frequency: occasionally 3 - Duration: ongoing 3 - Last Use / Amount: unsure              Sleep: Fair  Appetite:  Fair  Current Medications: Current Facility-Administered Medications  Medication Dose Route Frequency Provider Last Rate Last Dose  . albuterol (PROVENTIL  HFA;VENTOLIN HFA) 108 (90 Base) MCG/ACT inhaler 1-2 puff  1-2 puff Inhalation Q4H PRN Derrill Center, NP   2 puff at 12/19/17 1101  . busPIRone (BUSPAR) tablet 10 mg  10 mg Oral BID Patrecia Pour, NP   10 mg at 12/20/17 0741  . dicyclomine (BENTYL) tablet 20 mg  20 mg Oral Q6H PRN Money, Lowry Ram, FNP      . guaiFENesin (MUCINEX) 12 hr tablet 600 mg  600 mg Oral BID Money, Lowry Ram, FNP   600 mg at 12/20/17 0741  . loperamide (IMODIUM) capsule 2-4 mg  2-4 mg Oral PRN Money, Lowry Ram,  FNP      . magnesium hydroxide (MILK OF MAGNESIA) suspension 30 mL  30 mL Oral Daily PRN Money, Darnelle Maffucci B, FNP      . methadone (DOLOPHINE) tablet 60 mg  60 mg Oral Daily Reyden Smith, Myer Peer, MD   60 mg at 12/20/17 0742  . methocarbamol (ROBAXIN) tablet 500 mg  500 mg Oral Q8H PRN Money, Lowry Ram, FNP   500 mg at 12/19/17 2107  . nicotine (NICODERM CQ - dosed in mg/24 hours) patch 21 mg  21 mg Transdermal Daily Nanci Pina, FNP   21 mg at 12/20/17 0741  . ondansetron (ZOFRAN-ODT) disintegrating tablet 4 mg  4 mg Oral Q6H PRN Money, Lowry Ram, FNP      . pantoprazole (PROTONIX) EC tablet 40 mg  40 mg Oral Daily Nanci Pina, FNP   40 mg at 12/20/17 0741  . pneumococcal 23 valent vaccine (PNU-IMMUNE) injection 0.5 mL  0.5 mL Intramuscular Tomorrow-1000 Sharma Covert, MD      . prazosin (MINIPRESS) capsule 2 mg  2 mg Oral QHS Dara Hoyer, PA-C   2 mg at 12/19/17 2107  . sertraline (ZOLOFT) tablet 50 mg  50 mg Oral Daily Derrill Center, NP   50 mg at 12/20/17 0741    Lab Results:  Results for orders placed or performed during the hospital encounter of 12/16/17 (from the past 48 hour(s))  Comprehensive metabolic panel     Status: Abnormal   Collection Time: 12/20/17  6:10 AM  Result Value Ref Range   Sodium 139 135 - 145 mmol/L   Potassium 4.1 3.5 - 5.1 mmol/L   Chloride 104 101 - 111 mmol/L   CO2 26 22 - 32 mmol/L   Glucose, Bld 94 65 - 99 mg/dL   BUN 13 6 - 20 mg/dL   Creatinine, Ser 0.63 0.61  - 1.24 mg/dL   Calcium 8.6 (L) 8.9 - 10.3 mg/dL   Total Protein 7.0 6.5 - 8.1 g/dL   Albumin 3.4 (L) 3.5 - 5.0 g/dL   AST 239 (H) 15 - 41 U/L   ALT 360 (H) 17 - 63 U/L   Alkaline Phosphatase 113 38 - 126 U/L   Total Bilirubin 0.8 0.3 - 1.2 mg/dL   GFR calc non Af Amer >60 >60 mL/min   GFR calc Af Amer >60 >60 mL/min    Comment: (NOTE) The eGFR has been calculated using the CKD EPI equation. This calculation has not been validated in all clinical situations. eGFR's persistently <60 mL/min signify possible Chronic Kidney Disease.    Anion gap 9 5 - 15    Comment: Performed at [facility], [place] [address] [place], [place] [address]  APTT     Status: None   Collection Time: 12/20/17  6:10 AM  Result Value Ref Range   aPTT 33 24 - 36 seconds    Comment: Performed at [facility], [place] [address] [place], [place] [address]    Blood Alcohol level:  Lab Results  Component Value Date   ETH <10 12/16/2017   ETH <10 34/19/3790    Metabolic Disorder Labs: No results found for: HGBA1C, MPG No results found for: PROLACTIN No results found for: CHOL, TRIG, HDL, CHOLHDL, VLDL, LDLCALC  Physical Findings: AIMS: Facial and Oral Movements Muscles of Facial Expression: None, normal Lips and Perioral Area: None, normal Jaw: None, normal Tongue: None, normal,Extremity Movements Upper (arms, wrists, hands, fingers): None, normal Lower (legs, knees,  ankles, toes): None, normal, Trunk Movements Neck, shoulders, hips: None, normal, Overall Severity Severity of abnormal movements (highest score from questions above): None, normal Incapacitation due to abnormal movements: None, normal Patient's awareness of abnormal movements (rate only patient's report): No Awareness, Dental Status Current problems with teeth and/or dentures?: No Does patient usually wear dentures?: No  CIWA:  CIWA-Ar Total: 0 COWS:  COWS Total Score: 0  Musculoskeletal: Strength &  Muscle Tone: within normal limits Gait & Station: normal Patient leans: N/A  Psychiatric Specialty Exam: Physical Exam  Constitutional: He is oriented to person, place, and time. He appears well-developed and well-nourished.  HENT:  Head: Normocephalic.  Neck: Normal range of motion.  Respiratory: Effort normal.  Musculoskeletal: Normal range of motion.  Neurological: He is alert and oriented to person, place, and time.  Psychiatric: His speech is normal and behavior is normal. Judgment and thought content normal. His mood appears anxious. Cognition and memory are normal. He exhibits a depressed mood.    Review of Systems  Psychiatric/Behavioral: Positive for depression and substance abuse. The patient is nervous/anxious.   All other systems reviewed and are negative.   Blood pressure 126/81, pulse 74, temperature 98.3 F (36.8 C), resp. rate 16, height '5\' 10"'  (1.778 m), weight 121.1 kg (267 lb), SpO2 94 %.Body mass index is 38.31 kg/m.  General Appearance: Casual  Eye Contact:  Good  Speech:  Normal Rate  Volume:  Normal  Mood:  Anxious and Depressed  Affect:  Congruent  Thought Process:  Coherent and Descriptions of Associations: Intact  Orientation:  Full (Time, Place, and Person)  Thought Content:  Rumination  Suicidal Thoughts:  No  Homicidal Thoughts:  No  Memory:  Immediate;   Fair Recent;   Fair Remote;   Fair  Judgement:  Fair  Insight:  Fair  Psychomotor Activity:  Decreased  Concentration:  Concentration: Fair and Attention Span: Fair  Recall:  [employer] of Knowledge:  Fair  Language:  Good  Akathisia:  No  Handed:  Right  AIMS (if indicated):     Assets:  Leisure Time Physical Health Resilience Social Support  ADL's:  Intact  Cognition:  WNL  Sleep:  Number of Hours: 6   Treatment Plan Summary:Reviewed current treatment plan, Will continue the following plan without adjustment at this time. Daily contact with patient to assess and evaluate  symptoms and progress in treatment  Major depressive disorder, recurrent, severe without psychosis:  Medication: To reduce current symptoms to base line and improve the patient's overall level of functioning will continue Medication management as follows: -Continue Zoloft 50 mg daily for depression  Opiate dependence: -Continue Methadone 60 mg daily for opiate dependence  Anxiety --Continue Buspar 10 mg BID for anxiety   Nightmares: -Continue Prazosin 2 mg at bedtime for nightmares  Plan: 1. Patient admitted to the adult unit at [facility] under the service of Dr. Mallie Darting 2. Routine labs: UDS negative for alcohol, positive for cocaine.  Glucose, acetaminophen, and salicylate levels are WDL.  CBC & Diff WNL.  Met B WDL except  Low calcium of 8.8 and albumin 3.3, AST and ALT elevated at 243 and 346 respectively 3. Will maintain Q 15 minutes observation for safety.Estimated LOS: 5-7 days  4. During this hospitalization the patient will receive a psychosocialassessment. 5. Patient will participate ingroup, milieu, and family therapy.Psychotherapy: Social and Airline pilot, anti-bullying, learning based strategies, cognitive behavioral, and family object relations individuation separation intervention psychotherapies can be  considered. 6. Will continue to monitor patient's mood and behavior. 7. Social Work willschedule a Family meeting to obtain collateral information and discuss discharge and follow up plan.Discharge concerns will also be addressed: Safety, stabilization, and access to medication 8. Discharge date to be determined  Waylan Boga, NP 12/20/2017, 9:38 AM    ..Agree with NP Progress Note

## 2017-12-20 NOTE — Progress Notes (Signed)
Nursing Progress Note: 7p-7a D: Pt currently presents with a pleasant/anxious affect and behavior. Pt states "I am feeling much better being here." Interacting appropriately with the milieu. Pt reports good sleep during the previous night with current medication regimen. Pt did attend wrap-up group.  A: Pt provided with medications per providers orders. Pt's labs and vitals were monitored throughout the night. Pt supported emotionally and encouraged to express concerns and questions. Pt educated on medications.  R: Pt's safety ensured with 15 minute and environmental checks. Pt currently denies SI, HI, and AVH. Pt verbally contracts to seek staff if SI,HI, or AVH occurs and to consult with staff before acting on any harmful thoughts. Will continue to monitor.

## 2017-12-21 NOTE — Plan of Care (Signed)
Pt stated he was feeling worthless when he woke up this morning but is focusing on being more positive again today and has moved past this. Q70m checks continued. Pt contracts for safety. Will continue to monitor.

## 2017-12-21 NOTE — Progress Notes (Signed)
Recreation Therapy Notes  Date: 5.15.19 Time: 0930 Location: [address] Dayroom  Group Topic: Stress Management  Goal Area(s) Addresses:  Patient will verbalize importance of using healthy stress management.  Patient will identify positive emotions associated with healthy stress management.   Behavioral Response: Engaged  Intervention: Stress Management  Activity :  Meditation.  LRT played a meditation on being resilient in the face of adversity.  Patients were to follow along as the meditation played.   Education:  Stress Management, Discharge Planning.   Education Outcome: Acknowledges edcuation/In group clarification offered/Needs additional education  Clinical Observations/Feedback: Pt attended and participated in group.    Mariadel Mruk, LRT/CTRS         Seher Schlagel A 12/21/2017 11:00 AM 

## 2017-12-21 NOTE — [facility] Group Notes (Signed)
LCSW Group Therapy Note  12/21/2017 1:15pm  Type of Therapy and Topic:  Group Therapy: Avoiding Self-Sabotaging and Enabling Behaviors  Participation Level:  Active   Description of Group:   In this group, patients will learn how to identify obstacles, self-sabotaging and enabling behaviors, as well as: what are they, why do we do them and what needs these behaviors meet. Discuss unhealthy relationships and how to have positive healthy boundaries with those that sabotage and enable. Explore aspects of self-sabotage and enabling in yourself and how to limit these self-destructive behaviors in everyday life.   Therapeutic Goals: 1. Patient will identify one obstacle that relates to self-sabotage and enabling behaviors 2. Patient will identify one personal self-sabotaging or enabling behavior they did prior to admission 3. Patient will state a plan to change the above identified behavior 4. Patient will demonstrate ability to communicate their needs through discussion and/or role play.   Summary of Patient Progress:  Scott [name] was attentive and engaged during today's processing group. He shared that self sabotage has gotten in the way of his progress though: negative thinking, negative relationships, and over thinking situations. Scott [name] continues to make progress in the group setting with improving insight.    Therapeutic Modalities:   Cognitive Behavioral Therapy Person-Centered Therapy Motivational Interviewing   [employer], LCSW 12/21/2017 2:45 PM

## 2017-12-21 NOTE — Progress Notes (Addendum)
Patient ID: Scott [name], male   DOB: 08-30-1970, 47 y.o.   MRN: [id number]  Patient ID: Scott [name], male   DOB: 13-Mar-1971, 47 y.o.   MRN: [id number] [facility] MD Progress Note  12/21/2017 3:52 PM Scott [name]  MRN:  [id number] Subjective:  "I feel about the same."  6/10 depression with no suicidal ideations but no anxiety.  He is planning to discharge tomorrow and go to [facility] to continue his recovery.  During this evaluation, patient is alert and oriented x4 and cooperative.Patient isshowing slight improvement in moodand with a 6/10 depression with no suicidal ideations but no anxiety at this time.  Sleep and appetite are "good."Scott [name] is planning to go to [facility] who said he could do chores to help pay until he gets his check at the end of the month.  He is active in group sessionsand engages easily with his peers. Nelsondenies any HI or AVH and does not appear to be responding to internal stimuli. He has remained free fromself harmingbehaviors on the unit. Denies somatic complaints or acute pain. At this time, he is contracting for safety on the unit.  Continued Zoloft for depression, Buspar, methadone for substance dependence, and Prazosin for nightmares, denies side effects and withdrawal symptoms, monitoring continues.   Principal Problem: MDD (major depressive disorder), severe ([place]) Diagnosis:   Patient Active Problem List   Diagnosis Date Noted  . MDD (major depressive disorder), severe ([place]) [F32.2] 12/16/2017    Priority: High  . Cocaine use disorder, mild, abuse ([place]) [F14.10] 07/24/2015    Priority: High  . Alcohol use disorder, severe, dependence ([place]) [F10.20] 07/24/2015    Priority: Low  . Schizoaffective disorder ([place]) [F25.9] 06/07/2017  . Alcohol withdrawal ([place]) [F10.239] 07/24/2015  . Substance induced mood disorder ([place]) [F19.94] 07/22/2015  . Drug-seeking behavior [Z76.5] 02/16/2015  . PTSD (post-traumatic stress disorder) [F43.10]  12/26/2014  . Opioid type dependence, continuous ([place]) [F11.20] 12/26/2014  . Major depressive disorder, recurrent episode, moderate ([facility]) [F33.1]   . Chest pain at rest [R07.9] 10/22/2013  . Chronic pain syndrome [G89.4] 10/22/2013  . Seizure disorder ([place]) [U13.244] 10/22/2013  . Chest pain [R07.9] 10/21/2013   Total Time spent with patient: 30 minutes  Past Psychiatric History: PTSD, depression, anxiety, substance abuse  Past Medical History:  Past Medical History:  Diagnosis Date  . Accidental heroin overdose ([place])   . Anxiety   . Back pain   . Bipolar 1 disorder ([place])   . Chronic back pain   . Depression   . Drug-seeking behavior   . GERD (gastroesophageal reflux disease)   . Heroin abuse ([place])   . History of ETOH abuse   . Myocardial infarction [facility])    Pt says that the doctor determined it was panic attacks  . Opioid dependence ([place])   . PTSD (post-traumatic stress disorder)   . Seizures ([place])    alcohol induced- pt reports sober for 6 months    Past Surgical History:  Procedure Laterality Date  . ABDOMINAL SURGERY    . APPENDECTOMY    . BACK SURGERY     Family History:  Family History  Problem Relation Age of Onset  . Hypertension Mother   . CAD Father   . COPD Father   . Stroke Father   . Testicular cancer Brother    Family Psychiatric  History: NOne Social History:  Social History   Substance and Sexual Activity  Alcohol Use Not Currently   Comment: Pt reports  sober for 6 months     Social History   Substance and Sexual Activity  Drug Use Yes  . Types: Cocaine, Methamphetamines, Marijuana, IV   Comment: Pt says not sure why positive for cocaine    Social History   Socioeconomic History  . Marital status: Married    Spouse name: Not on file  . Number of children: Not on file  . Years of education: Not on file  . Highest education level: Not on file  Occupational History  . Not on file  Social Needs  . Financial resource strain: Not on  file  . Food insecurity:    Worry: Not on file    Inability: Not on file  . Transportation needs:    Medical: Not on file    Non-medical: Not on file  Tobacco Use  . Smoking status: Current Every Day Smoker    Packs/day: 1.50    Types: Cigarettes  . Smokeless tobacco: Never Used  Substance and Sexual Activity  . Alcohol use: Not Currently    Comment: Pt reports sober for 6 months  . Drug use: Yes    Types: Cocaine, Methamphetamines, Marijuana, IV    Comment: Pt says not sure why positive for cocaine  . Sexual activity: Yes    Birth control/protection: None    Comment: heroin  Lifestyle  . Physical activity:    Days per week: Not on file    Minutes per session: Not on file  . Stress: Not on file  Relationships  . Social connections:    Talks on phone: Not on file    Gets together: Not on file    Attends religious service: Not on file    Active member of club or organization: Not on file    Attends meetings of clubs or organizations: Not on file    Relationship status: Not on file  Other Topics Concern  . Not on file  Social History Narrative  . Not on file   Additional Social History:    Pain Medications: See home med list Prescriptions: See home med list Over the Counter: See home med list History of alcohol / drug use?: Yes Longest period of sobriety (when/how long): Pt reports 6 mon sober from alcohol Negative Consequences of Use: Financial, Personal relationships, Work / School   Name of Substance 2: cocaine 2 - Age of First Use: 88 2 - Amount (size/oz): "just a little" 2 - Frequency: occasionally 2 - Duration: ongoing 2 - Last Use / Amount: 12/15/17 Name of Substance 3: THC 3 - Age of First Use: 29 3 - Amount (size/oz): varies 3 - Frequency: occasionally 3 - Duration: ongoing 3 - Last Use / Amount: unsure              Sleep: Fair  Appetite:  Fair  Current Medications: Current Facility-Administered Medications  Medication Dose Route Frequency  Provider Last Rate Last Dose  . albuterol (PROVENTIL HFA;VENTOLIN HFA) 108 (90 Base) MCG/ACT inhaler 1-2 puff  1-2 puff Inhalation Q4H PRN Derrill Center, NP   2 puff at 12/21/17 1205  . busPIRone (BUSPAR) tablet 10 mg  10 mg Oral BID Patrecia Pour, NP   10 mg at 12/21/17 0751  . dicyclomine (BENTYL) tablet 20 mg  20 mg Oral Q6H PRN Money, Lowry Ram, FNP   20 mg at 12/20/17 2118  . guaiFENesin (MUCINEX) 12 hr tablet 600 mg  600 mg Oral BID Money, Lowry Ram, FNP   600 mg  at 12/21/17 0752  . loperamide (IMODIUM) capsule 2-4 mg  2-4 mg Oral PRN Money, Lowry Ram, FNP      . magnesium hydroxide (MILK OF MAGNESIA) suspension 30 mL  30 mL Oral Daily PRN Money, Darnelle Maffucci B, FNP      . methadone (DOLOPHINE) tablet 60 mg  60 mg Oral Daily Cobos, Myer Peer, MD   60 mg at 12/21/17 0751  . methocarbamol (ROBAXIN) tablet 500 mg  500 mg Oral Q8H PRN Money, Lowry Ram, FNP   500 mg at 12/21/17 1207  . nicotine (NICODERM CQ - dosed in mg/24 hours) patch 21 mg  21 mg Transdermal Daily Nanci Pina, FNP   21 mg at 12/21/17 0750  . ondansetron (ZOFRAN-ODT) disintegrating tablet 4 mg  4 mg Oral Q6H PRN Money, Lowry Ram, FNP      . pantoprazole (PROTONIX) EC tablet 40 mg  40 mg Oral Daily Nanci Pina, FNP   40 mg at 12/21/17 0751  . prazosin (MINIPRESS) capsule 2 mg  2 mg Oral QHS Dara Hoyer, PA-C   2 mg at 12/20/17 2118  . sertraline (ZOLOFT) tablet 50 mg  50 mg Oral Daily Derrill Center, NP   50 mg at 12/21/17 0751    Lab Results:  Results for orders placed or performed during the hospital encounter of 12/16/17 (from the past 48 hour(s))  Comprehensive metabolic panel     Status: Abnormal   Collection Time: 12/20/17  6:10 AM  Result Value Ref Range   Sodium 139 135 - 145 mmol/L   Potassium 4.1 3.5 - 5.1 mmol/L   Chloride 104 101 - 111 mmol/L   CO2 26 22 - 32 mmol/L   Glucose, Bld 94 65 - 99 mg/dL   BUN 13 6 - 20 mg/dL   Creatinine, Ser 0.63 0.61 - 1.24 mg/dL   Calcium 8.6 (L) 8.9 - 10.3 mg/dL    Total Protein 7.0 6.5 - 8.1 g/dL   Albumin 3.4 (L) 3.5 - 5.0 g/dL   AST 239 (H) 15 - 41 U/L   ALT 360 (H) 17 - 63 U/L   Alkaline Phosphatase 113 38 - 126 U/L   Total Bilirubin 0.8 0.3 - 1.2 mg/dL   GFR calc non Af Amer >60 >60 mL/min   GFR calc Af Amer >60 >60 mL/min    Comment: (NOTE) The eGFR has been calculated using the CKD EPI equation. This calculation has not been validated in all clinical situations. eGFR's persistently <60 mL/min signify possible Chronic Kidney Disease.    Anion gap 9 5 - 15    Comment: Performed at [facility], [place] [address], [place], [place] [address]  APTT     Status: None   Collection Time: 12/20/17  6:10 AM  Result Value Ref Range   aPTT 33 24 - 36 seconds    Comment: Performed at [facility], [place] [address], [place], [place] [address]    Blood Alcohol level:  Lab Results  Component Value Date   ETH <10 12/16/2017   ETH <10 07/24/2445    Metabolic Disorder Labs: No results found for: HGBA1C, MPG No results found for: PROLACTIN No results found for: CHOL, TRIG, HDL, CHOLHDL, VLDL, LDLCALC  Physical Findings: AIMS: Facial and Oral Movements Muscles of Facial Expression: None, normal Lips and Perioral Area: None, normal Jaw: None, normal Tongue: None, normal,Extremity Movements Upper (arms, wrists, hands, fingers): None, normal Lower (legs, knees, ankles, toes): None, normal, Trunk  Movements Neck, shoulders, hips: None, normal, Overall Severity Severity of abnormal movements (highest score from questions above): None, normal Incapacitation due to abnormal movements: None, normal Patient's awareness of abnormal movements (rate only patient's report): No Awareness, Dental Status Current problems with teeth and/or dentures?: No Does patient usually wear dentures?: No  CIWA:  CIWA-Ar Total: 0 COWS:  COWS Total Score: 1  Musculoskeletal: Strength & Muscle Tone: within normal limits Gait & Station:  normal Patient leans: N/A  Psychiatric Specialty Exam: Physical Exam  Constitutional: He is oriented to person, place, and time. He appears well-developed and well-nourished.  HENT:  Head: Normocephalic.  Neck: Normal range of motion.  Respiratory: Effort normal.  Musculoskeletal: Normal range of motion.  Neurological: He is alert and oriented to person, place, and time.  Psychiatric: His speech is normal and behavior is normal. Judgment and thought content normal. His mood appears anxious. Cognition and memory are normal. He exhibits a depressed mood.    Review of Systems  Psychiatric/Behavioral: Positive for depression and substance abuse. The patient is nervous/anxious.   All other systems reviewed and are negative.   Blood pressure 124/69, pulse 91, temperature 98.3 F (36.8 C), temperature source Oral, resp. rate (!) 24, height _0  (1.778 m), weight 121.1 kg (267 lb), SpO2 94 %.Body mass index is 38.31 kg/m.  General Appearance: Casual  Eye Contact:  Good  Speech:  Normal Rate  Volume:  Normal  Mood:  Anxious and Depressed  Affect:  Congruent  Thought Process:  Coherent and Descriptions of Associations: Intact  Orientation:  Full (Time, Place, and Person)  Thought Content:  Rumination  Suicidal Thoughts:  No  Homicidal Thoughts:  No  Memory:  Immediate;   Fair Recent;   Fair Remote;   Fair  Judgement:  Fair  Insight:  Fair  Psychomotor Activity:  Decreased  Concentration:  Concentration: Fair and Attention Span: Fair  Recall:  [employer] of Knowledge:  Fair  Language:  Good  Akathisia:  No  Handed:  Right  AIMS (if indicated):     Assets:  Leisure Time Physical Health Resilience Social Support  ADL's:  Intact  Cognition:  WNL  Sleep:  Number of Hours: 6.75   Treatment Plan Summary:Reviewed current treatment plan, Will continue the following plan without adjustment at this time. Daily contact with patient to assess and evaluate symptoms and progress in  treatment  Major depressive disorder, recurrent, severe without psychosis:  Medication: To reduce current symptoms to base line and improve the patient's overall level of functioning will continue Medication management as follows: -Continue Zoloft 50 mg daily for depression  Opiate dependence: -Continue Methadone 60 mg daily for opiate dependence  Anxiety --Continue Buspar 10 mg BID for anxiety   Nightmares: -Continue Prazosin 2 mg at bedtime for nightmares  Plan: 1. Patient admitted to the adult unit at [facility] under the service of Dr. Mallie Darting 2. Routine labs: UDS negative for alcohol, positive for cocaine.  Glucose, acetaminophen, and salicylate levels are WDL.  CBC & Diff WNL.  Met B WDL except  Low calcium of 8.8 and albumin 3.3, AST and ALT elevated at 243 and 346 respectively 3. Will maintain Q 15 minutes observation for safety.Estimated LOS: 5-7 days  4. During this hospitalization the patient will receive a psychosocialassessment. 5. Patient will participate ingroup, milieu, and family therapy.Psychotherapy: Social and Airline pilot, anti-bullying, learning based strategies, cognitive behavioral, and family object relations individuation separation intervention psychotherapies can be considered.  6. Will continue to monitor patient's mood and behavior. 7. Social Work willschedule a Family meeting to obtain collateral information and discuss discharge and follow up plan.Discharge concerns will also be addressed: Safety, stabilization, and access to medication 8. Discharge date to be determined  Waylan Boga, NP 12/21/2017, 3:52 PM    ..Agree with NP Progress Note

## 2017-12-21 NOTE — Plan of Care (Signed)
D: patient presented to medication window. Pt expressed feelings of sadness and hopelessness upon waking this morning. Pt stated he used positive thinking to push through. Pt denies any si/hi. Patient contracts for safety. Pt rated his depression/hopelessness/anxiety an 8/8/6 respectively. Pt stated his goal for today was to try and figure out another facility to be discharged to.  A: support and encouragement given with si. Pt contracts for safety. Pt given medications per protocol and standing orders. Q11m checks implemented and continued.  R: patient safe on the unit. Will continue to monitor.

## 2017-12-21 NOTE — Progress Notes (Signed)
D: Pt was in bed in his room upon initial approach.  He presents with depressed affect and mood.  He reports his day was "rough" and he attributes this to his cough.  Goal is to "hopefully feel better."  Denies SI/HI and hallucinations.  He has been visible in milieu at times and he interacts appropriately with peers and staff.  A: Introduced self to pt.  Actively listened to pt and provided support and encouragement.  Medication administered per order.  PRN medication administered for abdominal cramps and muscle spasms.  Q15 minute safety checks maintained.  R: Pt is compliant with medications.  He verbally contracts for safety and reports he will inform staff of needs and concerns.  Will continue to monitor and assess.

## 2017-12-21 NOTE — Progress Notes (Signed)
Johnny Bridge from [facility] presently visiting with patient.

## 2017-12-21 NOTE — [facility] Group Notes (Signed)
Adult Psychoeducational Group Note  Date:  12/21/2017 Time:  9:47 AM  Group Topic/Focus:  Goals Group:   The focus of this group is to help patients establish daily goals to achieve during treatment and discuss how the patient can incorporate goal setting into their daily lives to aide in recovery.  Participation Level:  Minimal  Participation Quality:  Drowsy  Affect:  Flat  Cognitive:  Lacking  Insight: Good  Engagement in Group:  Limited  Modes of Intervention:  Orientation  Additional Comments:   Pt attended and participated in orientation/goals group. Pt slept throughout group. Pt woke up at the end  And stated that he has been drowsy and he didn't sleep well. Pt goal for today is to figure out where to go once he's discharged. (Maybe an [facility])    Dellia Nims 12/21/2017, 9:47 AM

## 2017-12-22 DIAGNOSIS — F332 Major depressive disorder, recurrent severe without psychotic features: Principal | ICD-10-CM

## 2017-12-22 MED ORDER — SERTRALINE HCL 25 MG PO TABS
75.0000 mg | ORAL_TABLET | Freq: Every day | ORAL | Status: DC
  Administered 2017-12-23: 75 mg via ORAL
  Filled 2017-12-22 (×3): qty 3

## 2017-12-22 NOTE — Progress Notes (Signed)
D: Patient alert and oriented. Affect/mood: Depressed, pleasant, cooperative. Denies SI, HI, AVH at this time. Patient reports per patient self inventory sheet that he slept "fair" without the use of sleep medication, has had "fair" appetite, and rates his energy level "normal". Patient reports "poor" concentration, and pain to lower lumbar, R leg, and headache pain. Patient shares that he is okay with his pain and may be discharging him tomorrow. Patient rates feelings of depression and hopelessness "6", and rates feelings of anxiety "5" (0-10). Patient reports that his goal for the day is to "be more positive, and try to get depression to a good level".   A: Scheduled medications administered to patient per MD order. Support and encouragement provided. Routine safety checks conducted every 15 minutes. Patient informed to notify staff with problems or concerns. Encouraged to notify if overwhelming feelings of harm toward self or others. Patient agrees.  R: No adverse drug reactions noted. Patient contracts for safety at this time. Patient compliant with medications and treatment plan. Patient receptive, calm, and cooperative. Patient interacts well with others on the unit. Patient remains safe at this time. Will continue to monitor.

## 2017-12-22 NOTE — [facility] Group Notes (Signed)
LCSW Group Therapy Note  12/22/2017 1:15pm  Type of Therapy/Topic:  Group Therapy:  Feelings about Diagnosis  Participation Level:  Did Not Attend--pt invited. Sleeping in room.    Description of Group:   This group will allow patients to explore their thoughts and feelings about diagnoses they have received. Patients will be guided to explore their level of understanding and acceptance of these diagnoses. Facilitator will encourage patients to process their thoughts and feelings about the reactions of others to their diagnosis and will guide patients in identifying ways to discuss their diagnosis with significant others in their lives. This group will be process-oriented, with patients participating in exploration of their own experiences, giving and receiving support, and processing challenge from other group members.   Therapeutic Goals: 1. Patient will demonstrate understanding of diagnosis as evidenced by identifying two or more symptoms of the disorder 2. Patient will be able to express two feelings regarding the diagnosis 3. Patient will demonstrate their ability to communicate their needs through discussion and/or role play  Summary of Patient Progress:    x   Therapeutic Modalities:   Cognitive Behavioral Therapy Brief Therapy Feelings Identification    Ledell Peoples Smart, LCSW 12/22/2017 12:32 PM

## 2017-12-22 NOTE — Progress Notes (Signed)
Pt did attend group with [facility].  Pt actively participated in activity with his peers. 

## 2017-12-22 NOTE — [facility] Group Notes (Signed)
Adult Psychoeducational Group Note  Date:  12/22/2017 Time:  10:23 PM  Group Topic/Focus:  Wrap-Up Group:   The focus of this group is to help patients review their daily goal of treatment and discuss progress on daily workbooks.  Participation Level:  Active  Participation Quality:  Appropriate and Attentive  Affect:  Appropriate  Cognitive:  Alert and Appropriate  Insight: Appropriate and Good  Engagement in Group:  Engaged  Modes of Intervention:  Discussion and Education  Additional Comments:  Pt attended and participated in wrap up group this evening. Pt had a good day even though their were a lot of pt who were "on edge" on the unit. Pt completed their goal of completing their discharge plan for tomorrow.   Chrisandra Netters 12/22/2017, 10:23 PM

## 2017-12-22 NOTE — Progress Notes (Addendum)
[facility] MD Progress Note  12/22/2017 8:35 AM Scott [name]  MRN:  [id number] Subjective:  Patient reports partial improvement , but states he remains vaguely depressed. Denies SI. Ruminates about where he will go after discharge. States he does not want to return home because of tension with wife, whom he states  is over critical , demeaning . States he is thinking of getting into an [employer] setting. Denies medication side effects.   Objective : I have discussed case with treatment team and have met with patient. 47 year old male who presented with depression, suicidal ideations. On methadone maintenance .  Patient reports partially improved mood compared to admission, but remains depressed ,sad . Denies SI, and is future oriented, as above, worried about where he will be going, but is thinking of going to an [employer]. He does want to continue methadone maintenance . He denies suicidal ideations. Denies medication side effects. Visible in day room, going to some groups, cooperative on approach. Patient has reported history of Hep C- AST , ALT elevated/stable compared to prior .Denies associated symptoms.    Principal Problem: MDD (major depressive disorder), severe ([place]) Diagnosis:   Patient Active Problem List   Diagnosis Date Noted  . MDD (major depressive disorder), severe ([place]) [F32.2] 12/16/2017  . Schizoaffective disorder ([place]) [F25.9] 06/07/2017  . Alcohol use disorder, severe, dependence ([place]) [F10.20] 07/24/2015  . Alcohol withdrawal ([place]) [F10.239] 07/24/2015  . Cocaine use disorder, mild, abuse ([place]) [F14.10] 07/24/2015  . Substance induced mood disorder ([place]) [F19.94] 07/22/2015  . Drug-seeking behavior [Z76.5] 02/16/2015  . PTSD (post-traumatic stress disorder) [F43.10] 12/26/2014  . Opioid type dependence, continuous ([place]) [F11.20] 12/26/2014  . Major depressive disorder, recurrent episode, moderate ([facility]) [F33.1]   . Chest pain at rest [R07.9] 10/22/2013  . Chronic  pain syndrome [G89.4] 10/22/2013  . Seizure disorder ([place]) [O13.086] 10/22/2013  . Chest pain [R07.9] 10/21/2013   Total Time spent with patient: 20 minutes  Past Psychiatric History: PTSD, depression, anxiety, substance abuse  Past Medical History:  Past Medical History:  Diagnosis Date  . Accidental heroin overdose ([place])   . Anxiety   . Back pain   . Bipolar 1 disorder ([place])   . Chronic back pain   . Depression   . Drug-seeking behavior   . GERD (gastroesophageal reflux disease)   . Heroin abuse ([place])   . History of ETOH abuse   . Myocardial infarction [facility])    Pt says that the doctor determined it was panic attacks  . Opioid dependence ([place])   . PTSD (post-traumatic stress disorder)   . Seizures ([place])    alcohol induced- pt reports sober for 6 months    Past Surgical History:  Procedure Laterality Date  . ABDOMINAL SURGERY    . APPENDECTOMY    . BACK SURGERY     Family History:  Family History  Problem Relation Age of Onset  . Hypertension Mother   . CAD Father   . COPD Father   . Stroke Father   . Testicular cancer Brother    Family Psychiatric  History: NOne Social History:  Social History   Substance and Sexual Activity  Alcohol Use Not Currently   Comment: Pt reports sober for 6 months     Social History   Substance and Sexual Activity  Drug Use Yes  . Types: Cocaine, Methamphetamines, Marijuana, IV   Comment: Pt says not sure why positive for cocaine    Social History   Socioeconomic History  .  Marital status: Married    Spouse name: Not on file  . Number of children: Not on file  . Years of education: Not on file  . Highest education level: Not on file  Occupational History  . Not on file  Social Needs  . Financial resource strain: Not on file  . Food insecurity:    Worry: Not on file    Inability: Not on file  . Transportation needs:    Medical: Not on file    Non-medical: Not on file  Tobacco Use  . Smoking status: Current Every  Day Smoker    Packs/day: 1.50    Types: Cigarettes  . Smokeless tobacco: Never Used  Substance and Sexual Activity  . Alcohol use: Not Currently    Comment: Pt reports sober for 6 months  . Drug use: Yes    Types: Cocaine, Methamphetamines, Marijuana, IV    Comment: Pt says not sure why positive for cocaine  . Sexual activity: Yes    Birth control/protection: None    Comment: heroin  Lifestyle  . Physical activity:    Days per week: Not on file    Minutes per session: Not on file  . Stress: Not on file  Relationships  . Social connections:    Talks on phone: Not on file    Gets together: Not on file    Attends religious service: Not on file    Active member of club or organization: Not on file    Attends meetings of clubs or organizations: Not on file    Relationship status: Not on file  Other Topics Concern  . Not on file  Social History Narrative  . Not on file   Additional Social History:    Pain Medications: See home med list Prescriptions: See home med list Over the Counter: See home med list History of alcohol / drug use?: Yes Longest period of sobriety (when/how long): Pt reports 6 mon sober from alcohol Negative Consequences of Use: Financial, Personal relationships, Work / School   Name of Substance 2: cocaine 2 - Age of First Use: 24 2 - Amount (size/oz): "just a little" 2 - Frequency: occasionally 2 - Duration: ongoing 2 - Last Use / Amount: 12/15/17 Name of Substance 3: THC 3 - Age of First Use: 29 3 - Amount (size/oz): varies 3 - Frequency: occasionally 3 - Duration: ongoing 3 - Last Use / Amount: unsure  Sleep: improving   Appetite:  improving   Current Medications: Current Facility-Administered Medications  Medication Dose Route Frequency Provider Last Rate Last Dose  . albuterol (PROVENTIL HFA;VENTOLIN HFA) 108 (90 Base) MCG/ACT inhaler 1-2 puff  1-2 puff Inhalation Q4H PRN Derrill Center, NP   2 puff at 12/21/17 1558  . busPIRone (BUSPAR)  tablet 10 mg  10 mg Oral BID Patrecia Pour, NP   10 mg at 12/22/17 7106  . guaiFENesin (MUCINEX) 12 hr tablet 600 mg  600 mg Oral BID Money, Lowry Ram, FNP   600 mg at 12/22/17 2694  . magnesium hydroxide (MILK OF MAGNESIA) suspension 30 mL  30 mL Oral Daily PRN Money, Darnelle Maffucci B, FNP      . methadone (DOLOPHINE) tablet 60 mg  60 mg Oral Daily Dorsey Charette, Myer Peer, MD   60 mg at 12/22/17 0803  . nicotine (NICODERM CQ - dosed in mg/24 hours) patch 21 mg  21 mg Transdermal Daily Nanci Pina, FNP   21 mg at 12/22/17 0801  . pantoprazole (PROTONIX)  EC tablet 40 mg  40 mg Oral Daily Nanci Pina, FNP   40 mg at 12/22/17 0802  . prazosin (MINIPRESS) capsule 2 mg  2 mg Oral QHS Dara Hoyer, PA-C   2 mg at 12/21/17 2117  . sertraline (ZOLOFT) tablet 50 mg  50 mg Oral Daily Derrill Center, NP   50 mg at 12/22/17 0802    Lab Results:  No results found for this or any previous visit (from the past 48 hour(s)).  Blood Alcohol level:  Lab Results  Component Value Date   ETH <10 12/16/2017   ETH <10 61/60/7371    Metabolic Disorder Labs: No results found for: HGBA1C, MPG No results found for: PROLACTIN No results found for: CHOL, TRIG, HDL, CHOLHDL, VLDL, LDLCALC  Physical Findings: AIMS: Facial and Oral Movements Muscles of Facial Expression: None, normal Lips and Perioral Area: None, normal Jaw: None, normal Tongue: None, normal,Extremity Movements Upper (arms, wrists, hands, fingers): None, normal Lower (legs, knees, ankles, toes): None, normal, Trunk Movements Neck, shoulders, hips: None, normal, Overall Severity Severity of abnormal movements (highest score from questions above): None, normal Incapacitation due to abnormal movements: None, normal Patient's awareness of abnormal movements (rate only patient's report): No Awareness, Dental Status Current problems with teeth and/or dentures?: No Does patient usually wear dentures?: No  CIWA:  CIWA-Ar Total: 0 COWS:  COWS Total  Score: 1  Musculoskeletal: Strength & Muscle Tone: within normal limits Gait & Station: normal Patient leans: N/A  Psychiatric Specialty Exam: Physical Exam  Constitutional: He is oriented to person, place, and time. He appears well-developed and well-nourished.  HENT:  Head: Normocephalic.  Neck: Normal range of motion.  Respiratory: Effort normal.  Musculoskeletal: Normal range of motion.  Neurological: He is alert and oriented to person, place, and time.  Psychiatric: His speech is normal and behavior is normal. Judgment and thought content normal. His mood appears anxious. Cognition and memory are normal. He exhibits a depressed mood.    Review of Systems  Psychiatric/Behavioral: Positive for depression and substance abuse. The patient is nervous/anxious.   All other systems reviewed and are negative.   Blood pressure 124/73, pulse 84, temperature 98.7 F (37.1 C), temperature source Oral, resp. rate 16, height '5\' 10"'  (1.778 m), weight 121.1 kg (267 lb), SpO2 94 %.Body mass index is 38.31 kg/m.  General Appearance: improving grooming   Eye Contact:  Good  Speech:  Normal Rate  Volume:  Normal  Mood:  some improvement but remains depressed   Affect:  Appropriate and constricted, but reactive   Thought Process:  Linear and Descriptions of Associations: Intact  Orientation:  Full (Time, Place, and Person)  Thought Content:  Rumination- no hallucinations, no delusions   Suicidal Thoughts:  No currently denies suicidal or self injurious ideations, denies any homicidal or violent ideations   Homicidal Thoughts:  No  Memory:  recent and remote grossly intact   Judgement:  Other:  improving   Insight:  Fair and improving   Psychomotor Activity:  Normal  Concentration:  Concentration: Good and Attention Span: Good  Recall:  Good  Fund of Knowledge:  Good  Language:  Good  Akathisia:  No  Handed:  Right  AIMS (if indicated):     Assets:  Leisure Time Physical  Health Resilience Social Support  ADL's:  Intact  Cognition:  WNL  Sleep:  Number of Hours: 6   Assessment- patient reports partial improvement in mood , but remains vaguely depressed, ruminative ,  in particular about where he will be going after discharge, but at this time is planning on going to an [facility] and continue methadone maintenance . Denies SI. Tolerating medications well .   Treatment Plan Summary: Treatment plan reviewed as below today 5/16  Daily contact with patient to assess and evaluate symptoms and progress in treatment  Major depressive disorder, recurrent, severe without psychosis:   Medication:  -Increase  Zoloft to 75  mg daily for depression  Opiate dependence: -Continue Methadone 60 mg daily for opiate dependence  Anxiety --Continue Buspar 10 mg BID for anxiety   Nightmares: -Continue Prazosin 2 mg at bedtime for nightmares  Treatment Plan working on disposition planning options  Jenne Campus, MD 12/22/2017, 8:35 AM    Patient ID: Scott [name], male   DOB: March 04, 1971, 47 y.o.   MRN: [id number]

## 2017-12-22 NOTE — Progress Notes (Signed)
D    Pt is pleasant on approach and cooperative    Requested his inhaler due to being SOB /congested just before group time   He interacts appropriately and is compliant with treatment     A    Verbal support given   Medications administered and effectiveness monitored    Q 15 min checks R    Pt is safe and receptive to verbal support

## 2017-12-23 MED ORDER — BUSPIRONE HCL 10 MG PO TABS: 10.0000 mg | ORAL_TABLET | Freq: Two times a day (BID) | ORAL | 0 refills | Status: DC

## 2017-12-23 MED ORDER — SERTRALINE HCL 25 MG PO TABS: 75.0000 mg | ORAL_TABLET | Freq: Every day | ORAL | 0 refills | Status: DC

## 2017-12-23 MED ORDER — PRAZOSIN HCL 2 MG PO CAPS: 2.0000 mg | ORAL_CAPSULE | Freq: Every day | ORAL | 0 refills | Status: DC

## 2017-12-23 MED ORDER — PANTOPRAZOLE SODIUM 40 MG PO TBEC: 40.0000 mg | DELAYED_RELEASE_TABLET | Freq: Every day | ORAL | 0 refills | Status: DC

## 2017-12-23 MED ORDER — HYDROXYZINE HCL 25 MG PO TABS: 25.0000 mg | ORAL_TABLET | Freq: Four times a day (QID) | ORAL | 0 refills | Status: DC | PRN

## 2017-12-23 NOTE — Progress Notes (Signed)
Patient ID: Scott [name], male   DOB: 26-Mar-1971, 47 y.o.   MRN: [id number]  D: Assumed care patient @ 2330. Patient in bed sleeping. Respiration regular and unlabored. No sign of distress noted at this time A: 15 mins checks for safety. R: Patient remains safe.

## 2017-12-23 NOTE — [facility] Suicide Risk Assessment (Signed)
[facility] Discharge Suicide Risk Assessment   Principal Problem: MDD (major depressive disorder), severe [place]) Discharge Diagnoses:  Patient Active Problem List   Diagnosis Date Noted  . MDD (major depressive disorder), severe (HCC) [F32.2] 12/16/2017  . Schizoaffective disorder ([facility]) [F25.9] 06/07/2017  . Alcohol use disorder, severe, dependence (HCC) [F10.20] 07/24/2015  . Alcohol withdrawal ([facility]CC) [F10.239] 07/24/2015  . Cocaine use disorder, mild, abuse ([facility]) [F14.10] 07/24/2015  . Substance induced mood disorder ([facility]CC) [F19.94] 07/22/2015  . Drug-seeking behavior [Z76.5] 02/16/2015  . PTSD (post-traumatic stress disorder) [F43.10] 12/26/2014  . Opioid type dependence, continuous (HCC) [F11.20] 12/26/2014  . Major depressive disorder, recurrent episode, moderate (HCC) [F33.1]   . Chest pain at rest [R07.9] 10/22/2013  . Chronic pain syndrome [G89.4] 10/22/2013  . Seizure disorder (HCC) [G40.909] 10/22/2013  . Chest pain [R07.9] 10/21/2013    Total Time spent with patient: 30 minutes  Musculoskeletal: Strength & Muscle Tone: within normal limits Gait & Station: normal Patient leans: N/A  Psychiatric Specialty Exam: ROS mild headache, no chest pain, no shortness of breath, reports dry cough ( 5/10 Chest X Ray negative) no abdominal pain, no vomiting, no fever, no chills  Blood pressure 114/76, pulse 67, temperature 98.2 F (36.8 C), temperature source Oral, resp. rate 16, height  (1.778 m), weight 121.1 kg (267 lb), SpO2 94 %.Body mass index is 38.31 kg/m.  General Appearance: Well Groomed  Eye Contact::  Good  Speech:  Normal Rate409  Volume:  Normal  Mood:  improving mood , reports he is feeling better   Affect:  Appropriate and Full Range  Thought Process:  Linear and Descriptions of Associations: Intact  Orientation:  Full (Time, Place, and Person)  Thought Content:  no hallucinations, no delusions, not internally preoccupied   Suicidal Thoughts:  No denies suicidal or  self injurious ideations, denies any homicidal or violent ideations   Homicidal Thoughts:  No  Memory:  recent and remote grossly intact   Judgement:  Other:  improving   Insight:  improving   Psychomotor Activity:  Normal  Concentration:  Good  Recall:  Good  Fund of Knowledge:Good  Language: Good  Akathisia:  Negative  Handed:  Right  AIMS (if indicated):     Assets:  Communication Skills Resilience  Sleep:  Number of Hours: 5.75  Cognition: WNL  ADL's:  Intact   Mental Status Per Nursing Assessment::   On Admission:  NA  Demographic Factors:  47 year old married male, has three children, on disability  Loss Factors: Marital tension  Historical Factors: History of depression, history of PTSD, history of opiate dependence, now in agonist therapy  Risk Reduction Factors:   Responsible for children under 15 years of age, Sense of responsibility to family, Living with another person, especially a relative and Positive coping skills or problem solving skills  Continued Clinical Symptoms:  At this time patient is alert, attentive, well related, mood improved, affect appropriate, reactive,no thought disorder, no suicidal or self injurious ideations , no homicidal ideations, no hallucinations, no delusions, not internally preoccupied. Denies current medication side effects. Behavior on unit in good control, pleasant on approach.  Cognitive Features That Contribute To Risk:  No gross cognitive deficits noted upon discharge. Is alert , attentive, and oriented x 3   Suicide Risk:  Mild:  Suicidal ideation of limited frequency, intensity, duration, and specificity.  There are no identifiable plans, no associated intent, mild dysphoria and related symptoms, good self-control (both objective and subjective assessment), few other  risk factors, and identifiable protective factors, including available and accessible social support.  Follow-up Information    [facility]  Follow up.   Why:  Please resume servies at this agency for methadone maintenance and SAIOP groups. Thank you.  Contact information: [address], [place] [address] Phone: [phone] Fax: [phone]          Plan Of Care/Follow-up recommendations:  Activity:  as tolerated  Diet:  regular Tests:  NA Other:  See below  Patient is expressing readiness for discharge, and is leaving unit in good spirits  Plans to go to an [address] [employer] to follow up at [facility] for psychiatric management and methadone maintenance Plans to see a PCP for ongoing monitoring , medical management as needed   Craige Cotta, MD 12/23/2017, 1:25 PM

## 2017-12-23 NOTE — Progress Notes (Signed)
Recreation Therapy Notes  Date: 5.17.19 Time: 0930 Location: [address] Dayroom  Group Topic: Stress Management  Goal Area(s) Addresses:  Patient will verbalize importance of using healthy stress management.  Patient will identify positive emotions associated with healthy stress management.   Intervention: Stress Management  Activity :  Progressive Muscle Relaxation.  LRT lead patients through the process of tensing each muscle group then releasing the tension.  Patients were to follow along as LRT read script to guide patients through the process.  Education:  Stress Management, Discharge Planning.   Education Outcome: Acknowledges edcuation/In group clarification offered/Needs additional education  Clinical Observations/Feedback: Pt did not attend group.    Caroll Rancher, LRT/CTRS         Caroll Rancher A 12/23/2017 12:20 PM

## 2017-12-23 NOTE — B[facility] Suicide Risk Assessment (Signed)
[facility] INPATIENT:  Family/Significant Other Suicide Prevention Education  Suicide Prevention Education:  Education Completed; Scott [name] (pt's wife) [phone] has been identified by the patient as the family member/significant other with whom the patient will be residing, and identified as the person(s) who will aid the patient in the event of a mental health crisis (suicidal ideations/suicide attempt).  With written consent from the patient, the family member/significant other has been provided the following suicide prevention education, prior to the and/or following the discharge of the patient.  The suicide prevention education provided includes the following:  Suicide risk factors  Suicide prevention and interventions  National Suicide Hotline telephone number  [facility] assessment telephone number  [facility] Emergency Assistance 911  [facility] and/or Residential Mobile Crisis Unit telephone number  Request made of family/significant other to:  Remove weapons (e.g., guns, rifles, knives), all items previously/currently identified as safety concern.    Remove drugs/medications (over-the-counter, prescriptions, illicit drugs), all items previously/currently identified as a safety concern.  The family member/significant other verbalizes understanding of the suicide prevention education information provided.  The family member/significant other agrees to remove the items of safety concern listed above.  Trixie Maclaren N Smart LCSW 12/23/2017, 8:58 AM

## 2017-12-23 NOTE — Progress Notes (Signed)
Patient was discharged per order. AVS, SRA, scripts and transition summary were all reviewed with patient. Pt was given an opportunity to ask questions and verbalized understanding of all discharge paperwork. Belongings were returned, and patient signed for receipt. Patient verbalized readiness for discharge and appeared in no acute distress when escorted to lobby.

## 2017-12-23 NOTE — Plan of Care (Signed)
D: Neill denied SI, HI, and AVH this a.m. He said he's ready to be discharged. He rated his depression 4/10, hopelessness 2/10, and anxiety 3/10.  He reported fair sleep, good appetite, and normal energy level. He didn't have any questions, concerns, or complaints when asked.   A: Meds given as ordered. Q15 safety checks maintained. Support/encouragement offered.  R: Pt remains free from harm and continues with treatment. Will continue to monitor for needs/safety.   Problem: Education: Goal: Emotional status will improve Outcome: Adequate for Discharge   Problem: Coping: Goal: Ability to verbalize frustrations and anger appropriately will improve Outcome: Adequate for Discharge Goal: Ability to demonstrate self-control will improve Outcome: Adequate for Discharge   Problem: Safety: Goal: Periods of time without injury will increase Outcome: Adequate for Discharge   Problem: Health Behavior/Discharge Planning: Goal: Compliance with therapeutic regimen will improve Outcome: Adequate for Discharge   Problem: Medication: Goal: Compliance with prescribed medication regimen will improve Outcome: Adequate for Discharge

## 2017-12-23 NOTE — Discharge Summary (Addendum)
Physician Discharge Summary Note  Patient:  Scott [name] is an 47 y.o., male MRN:  [id number] DOB:  [birth year] Patient phone:  [phone] (home)  Patient address:   [phone] [place] [address],  Total Time spent with patient: 30 minutes  Date of Admission:  12/16/2017 Date of Discharge:12/23/2017  Reason for Admission: History of Present Illness: Scott [name] 47 year old caucasian male present with depression and suicidal ideations.  is awake alert and oriented x3 flat guarded affect.  Reports feeling depressed due to his wife keeps bringing up his past, States her statement makes him have suicidial thoughts.  Reports he is never good enough for her and reports feelings of despair and hopelessness.  Rates his depression 10 /10 during this assessment patient is requesting to be restarted on methadone 60 mg reports he is prescribed by Dr. Betti Cruz at [facility].  UDS positive for cocaine . denies that he is prescribed and anyother antidepressants or psychotropic medications. See SRA for medication management. Support encouragement and reassurance was provided.   Associated Signs/Symptoms: Depression Symptoms:  depressed mood, feelings of worthlessness/guilt, disturbed sleep, decreased appetite, (Hypo) Manic Symptoms:  Distractibility, Anxiety Symptoms:  Excessive Worry, Psychotic Symptoms:  Hallucinations: None PTSD Symptoms: Avoidance:  Decreased Interest/Participation    Principal Problem: MDD (major depressive disorder), severe [facility]) Discharge Diagnoses: Patient Active Problem List   Diagnosis Date Noted  . MDD (major depressive disorder), severe (HCC) [F32.2] 12/16/2017  . Schizoaffective disorder (HCC) [F25.9] 06/07/2017  . Alcohol use disorder, severe, dependence (HCC) [F10.20] 07/24/2015  . Alcohol withdrawal (HCC) [F10.239] 07/24/2015  . Cocaine use disorder, mild, abuse (HCC) [F14.10] 07/24/2015  . Substance induced mood disorder (HCC) [F19.94] 07/22/2015  . Drug-seeking  behavior [Z76.5] 02/16/2015  . PTSD (post-traumatic stress disorder) [F43.10] 12/26/2014  . Opioid type dependence, continuous (HCC) [F11.20] 12/26/2014  . Major depressive disorder, recurrent episode, moderate (HCC) [F33.1]   . Chest pain at rest [R07.9] 10/22/2013  . Chronic pain syndrome [G89.4] 10/22/2013  . Seizure disorder (HCC) [G40.909] 10/22/2013  . Chest pain [R07.9] 10/21/2013    Past Psychiatric History:   Past Medical History:  Past Medical History:  Diagnosis Date  . Accidental heroin overdose (HCC)   . Anxiety   . Back pain   . Bipolar 1 disorder (HCC)   . Chronic back pain   . Depression   . Drug-seeking behavior   . GERD (gastroesophageal reflux disease)   . Heroin abuse (HCC)   . History of ETOH abuse   . Myocardial infarction [facility])    Pt says that the doctor determined it was panic attacks  . Opioid dependence (HCC)   . PTSD (post-traumatic stress disorder)   . Seizures (HCC)    alcohol induced- pt reports sober for 6 months    Past Surgical History:  Procedure Laterality Date  . ABDOMINAL SURGERY    . APPENDECTOMY    . BACK SURGERY     Family History:  Family History  Problem Relation Age of Onset  . Hypertension Mother   . CAD Father   . COPD Father   . Stroke Father   . Testicular cancer Brother    Family Psychiatric  History: Reports mother side of the family has history of depression schizophrenia reports uncle: Psychosis  Social History:  Social History   Substance and Sexual Activity  Alcohol Use Not Currently   Comment: Pt reports sober for 6 months     Social History   Substance and Sexual Activity  Drug Use Yes  . Types: Cocaine, Methamphetamines, Marijuana, IV   Comment: Pt says not sure why positive for cocaine    Social History   Socioeconomic History  . Marital status: Married    Spouse name: Not on file  . Number of children: Not on file  . Years of education: Not on file  . Highest education level: Not on file   Occupational History  . Not on file  Social Needs  . Financial resource strain: Not on file  . Food insecurity:    Worry: Not on file    Inability: Not on file  . Transportation needs:    Medical: Not on file    Non-medical: Not on file  Tobacco Use  . Smoking status: Current Every Day Smoker    Packs/day: 1.50    Types: Cigarettes  . Smokeless tobacco: Never Used  Substance and Sexual Activity  . Alcohol use: Not Currently    Comment: Pt reports sober for 6 months  . Drug use: Yes    Types: Cocaine, Methamphetamines, Marijuana, IV    Comment: Pt says not sure why positive for cocaine  . Sexual activity: Yes    Birth control/protection: None    Comment: heroin  Lifestyle  . Physical activity:    Days per week: Not on file    Minutes per session: Not on file  . Stress: Not on file  Relationships  . Social connections:    Talks on phone: Not on file    Gets together: Not on file    Attends religious service: Not on file    Active member of club or organization: Not on file    Attends meetings of clubs or organizations: Not on file    Relationship status: Not on file  Other Topics Concern  . Not on file  Social History Narrative  . Not on file    Hospital Course:  Mclane Arora was admitted for MDD (major depressive disorder), severe (HCC) and crisis management.  She was treated with the following medications Zoloft  po [name] for depression, Buspar  po BID.   Scott [name] was discharged with current medication and was instructed on how to take medications as prescribed; (details listed below under Medication List).  Medical problems were identified and treated as needed.  Home medications were restarted as appropriate.  Labs obtained during admission were reviewed and assessed and determined to be abnormal included AST at 239, ALT at 360, and UDS positive for cocaine.  Improvement was monitored by observation and Scott [name].   Emotional and mental status was monitored by [name] self-inventory reports completed by Scott [name].         Scott [name] was evaluated by the treatment team for stability and plans for continued recovery upon discharge.  Scott [name] was an integral factor for scheduling further treatment.  Employment, transportation, bed availability, health status, family support, and any pending legal issues were also considered during his hospital stay.  He was offered further treatment options upon discharge including but not limited to Residential, Intensive Outpatient, and Outpatient treatment.  Scott [name].     Upon completion of this admission the Scott [name] was both mentally and medically stable for discharge denying suicidal/homicidal ideation, auditory/visual/tactile hallucinations, delusional thoughts and paranoia.     Physical Findings: AIMS: Facial and Oral Movements Muscles of Facial Expression:  None, normal Lips and Perioral Area: None, normal Jaw: None, normal Tongue: None, normal,Extremity Movements Upper (arms, wrists, hands, fingers): None, normal Lower (legs, knees, ankles, toes): None, normal, Trunk Movements Neck, shoulders, hips: None, normal, Overall Severity Severity of abnormal movements (highest score from questions above): None, normal Incapacitation due to abnormal movements: None, normal Patient's awareness of abnormal movements (rate only patient's report): No Awareness, Dental Status Current problems with teeth and/or dentures?: No Does patient usually wear dentures?: No  CIWA:  CIWA-Ar Total: 0 COWS:  COWS Total Score: 0  Musculoskeletal: Strength & Muscle Tone: within normal limits Gait & Station: normal Patient leans: N/A  Psychiatric Specialty Exam: See MD SRA Physical Exam  ROS  Blood pressure 114/76, pulse 67, temperature 98.2 F (36.8 C), temperature  source Oral, resp. rate 16, height  (1.778 m), weight 121.1 kg (267 lb), SpO2 94 %.Body mass index is 38.31 kg/m.  Sleep:  Number of Hours: 5.75    Have you used any form of tobacco in the last 30 days? (Cigarettes, Smokeless Tobacco, Cigars, and/or Pipes): Yes  Has this patient used any form of tobacco in the last 30 days? (Cigarettes, Smokeless Tobacco, Cigars, and/or Pipes)  No  Blood Alcohol level:  Lab Results  Component Value Date   ETH <10 12/16/2017   ETH <10 08/31/2017    Metabolic Disorder Labs:  No results found for: HGBA1C, MPG No results found for: PROLACTIN No results found for: CHOL, TRIG, HDL, CHOLHDL, VLDL, LDLCALC  See Psychiatric Specialty Exam and Suicide Risk Assessment completed by Attending Physician prior to discharge.  Discharge destination:  Home  Is patient on multiple antipsychotic therapies at discharge:  No   Has Patient had three or more failed trials of antipsychotic monotherapy by history:  No  Recommended Plan for Multiple Antipsychotic Therapies: NA   Allergies as of 12/23/2017      Reactions   Benadryl [diphenhydramine Hcl] Anaphylaxis, Other (See Comments)   Muscles lock up.   Celecoxib Diarrhea, Rash, Other (See Comments)   Nose bleeds, bloating, migraines   Diphenhydramine Anaphylaxis   Hard time breathing, convulsions,   Prednisone Other (See Comments)   Severe Anger   Risperidone And Related Other (See Comments)   Overly sedates   Nsaids Diarrhea, Rash, Other (See Comments)   Nose bleeds, bloating, migraines   Other Other (See Comments)   STEROIDS--caused anger,irritation   Rofecoxib Diarrhea, Rash, Other (See Comments)   Nose bleeds, bloating, migraines   Tolmetin Diarrhea, Rash, Other (See Comments)   Nose bleeds, bloating, migraines   Trazodone And Nefazodone Other (See Comments)   headaches   Ibuprofen Rash   Latex Itching, Rash, Other (See Comments)   Skin cracks   Paliperidone Palmitate Er Other (See Comments)    Overly sedates      Medication List    STOP taking these medications   albuterol 108 (90 Base) MCG/ACT inhaler Commonly known as:  PROVENTIL HFA;VENTOLIN HFA   multivitamin with minerals Tabs tablet   nicotine 21 mg/24hr patch Commonly known as:  NICODERM CQ - dosed in mg/24 hours   omeprazole 40 MG capsule Commonly known as:  PRILOSEC Replaced by:  pantoprazole 40 MG tablet   QUEtiapine 25 MG tablet Commonly known as:  SEROQUEL     TAKE these medications     Indication  busPIRone 10 MG tablet Commonly known as:  BUSPAR Take 1 tablet (10 mg total) by mouth 2 (two) times [name].  Indication:  Major Depressive Disorder  hydrOXYzine 25 MG tablet Commonly known as:  ATARAX/VISTARIL Take 1 tablet (25 mg total) by mouth every 6 (six) hours as needed for anxiety.  Indication:  Feeling Anxious   METHADONE HCL PO Take 60 mg by mouth [name].  Indication:  Moderate to Severe Chronic Pain   pantoprazole 40 MG tablet Commonly known as:  PROTONIX Take 1 tablet (40 mg total) by mouth [name]. Start taking on:  12/24/2017 Replaces:  omeprazole 40 MG capsule  Indication:  Heartburn   prazosin 2 MG capsule Commonly known as:  MINIPRESS Take 1 capsule (2 mg total) by mouth at bedtime. What changed:  additional instructions  Indication:  night terrors   sertraline 25 MG tablet Commonly known as:  ZOLOFT Take 3 tablets (75 mg total) by mouth [name]. Start taking on:  12/24/2017  Indication:  Major Depressive Disorder   traMADol 50 MG tablet Commonly known as:  ULTRAM Take 50 mg by mouth every 6 (six) hours as needed.  Indication:  Pain      Follow-up [name]    [facility]   Specialty:  Behavioral Health Why:  Please resume servies at this agency for methadone maintenance and SAIOP groups. Thank you.  Contact [name]: [address] [place] [address] [phone]           Follow-up recommendations:  Activity:   Increase activity as tolerated. Diet:  Regular house diet as tolerated Tests:  Routine test as suggested by outpatient pscyhiatrist.  Suggest following up with liver enzyme panel, and possible referral to hepatology clinic for further management of elevated liver enzymes. Other:  Even if you begiin to feel better continue taking your medications.   Signed: Truman Hayward, FNP 12/23/2017, 9:43 AM   Patient seen, Suicide Assessment Completed.  Disposition Plan Reviewed

## 2017-12-23 NOTE — Progress Notes (Addendum)
  [facility] Adult Case Management Discharge Plan :  Will you be returning to the same living situation after discharge:  No. Pt plans to enter [facility] house at discharge. At discharge, do you have transportation home?: Yes,  Winferd Humphrey or bus Do you have the ability to pay for your medications: Yes,  [employer]   Release of information consent forms completed and submitted to medical records by CSW.  Patient to Follow up at: Follow-up Information    [facility]   Why:  Please resume servies at this agency for methadone maintenance and SAIOP groups. Thank you.  Contact information: [address], [place] [address] Phone: [phone] Fax: [phone]          Next level of care provider has access to [facility] Link:no  Safety Planning and Suicide Prevention discussed: Yes,  SPE completed with both pt and his wife. SPI pamphlet and Mobile Crisis information provided to pt.   Have you used any form of tobacco in the last 30 days? (Cigarettes, Smokeless Tobacco, Cigars, and/or Pipes): Yes  Has patient been referred to the Quitline?: Patient refused referral  Patient has been referred for addiction treatment: Yes  [employer], LCSW 12/23/2017, 1:08 PM

## 2017-12-23 NOTE — Tx Team (Signed)
Interdisciplinary Treatment and Diagnostic Plan Update  12/23/2017 Time of Session: 1610RU Scott [name] MRN: [id number]  Principal Diagnosis: MDD (major depressive disorder), severe (HCC)  Secondary Diagnoses: Principal Problem:   MDD (major depressive disorder), severe (HCC) Active Problems:   Cocaine use disorder, mild, abuse (HCC)   Current Medications:  Current Facility-Administered Medications  Medication Dose Route Frequency Provider Last Rate Last Dose  . albuterol (PROVENTIL HFA;VENTOLIN HFA) 108 (90 Base) MCG/ACT inhaler 1-2 puff  1-2 puff Inhalation Q4H PRN Oneta Rack, NP   2 puff at 12/22/17 2006  . busPIRone (BUSPAR) tablet 10 mg  10 mg Oral BID Charm Rings, NP   10 mg at 12/23/17 0748  . guaiFENesin (MUCINEX) 12 hr tablet 600 mg  600 mg Oral BID Money, Gerlene Burdock, FNP   600 mg at 12/23/17 0749  . magnesium hydroxide (MILK OF MAGNESIA) suspension 30 mL  30 mL Oral Daily PRN Money, Feliz Beam B, FNP      . methadone (DOLOPHINE) tablet 60 mg  60 mg Oral Daily Cobos, Rockey Situ, MD   60 mg at 12/23/17 0748  . nicotine (NICODERM CQ - dosed in mg/24 hours) patch 21 mg  21 mg Transdermal Daily Truman Hayward, FNP   21 mg at 12/23/17 0749  . pantoprazole (PROTONIX) EC tablet 40 mg  40 mg Oral Daily Truman Hayward, FNP   40 mg at 12/23/17 0748  . prazosin (MINIPRESS) capsule 2 mg  2 mg Oral QHS Court Joy, PA-C   2 mg at 12/22/17 2125  . sertraline (ZOLOFT) tablet 75 mg  75 mg Oral Daily Cobos, Rockey Situ, MD   75 mg at 12/23/17 9147   PTA Medications: Medications Prior to Admission  Medication Sig Dispense Refill Last Dose  . albuterol (PROVENTIL HFA;VENTOLIN HFA) 108 (90 Base) MCG/ACT inhaler Inhale 2 puffs into the lungs 4 (four) times daily as needed for wheezing or shortness of breath.   0 12/16/2017 at noon  . hydrOXYzine (ATARAX/VISTARIL) 25 MG tablet Take 1 tablet (25 mg total) by mouth every 6 (six) hours as needed for anxiety. (Patient not taking: Reported on  12/16/2017) 30 tablet 0 Not Taking at Unknown time  . METHADONE HCL PO Take 60 mg by mouth daily.   12/16/2017 at 930  . Multiple Vitamin (MULTIVITAMIN WITH MINERALS) TABS tablet Take 1 tablet by mouth daily. 30 tablet 0 12/16/2017 at am  . nicotine (NICODERM CQ - DOSED IN MG/24 HOURS) 21 mg/24hr patch Place 1 patch (21 mg total) onto the skin daily. 28 patch 0 12/15/2017 at Unknown time  . omeprazole (PRILOSEC) 40 MG capsule Take 40 mg by mouth at bedtime.  0 12/15/2017 at pm  . prazosin (MINIPRESS) 2 MG capsule Take 2 mg by mouth at bedtime. For night terrors  0 12/15/2017 at pm  . QUEtiapine (SEROQUEL) 25 MG tablet Take 1 tablet (25 mg total) by mouth at bedtime. (Patient not taking: Reported on 12/16/2017) 30 tablet 0 Not Taking at Unknown time  . traMADol (ULTRAM) 50 MG tablet Take 50 mg by mouth every 6 (six) hours as needed.  0 couple days ago    Patient Stressors: Financial difficulties Health problems Marital or family conflict Substance abuse  Patient Strengths: Ability for insight Average or above average intelligence Capable of independent living Wellsite geologist fund of knowledge Motivation for treatment/growth  Treatment Modalities: Medication Management, Group therapy, Case management,  1 to 1 session with clinician, Psychoeducation, Recreational therapy.  Physician Treatment Plan for Primary Diagnosis: MDD (major depressive disorder), severe (HCC) Long Term Goal(s): Improvement in symptoms so as ready for discharge Improvement in symptoms so as ready for discharge   Short Term Goals: Ability to identify changes in lifestyle to reduce recurrence of condition will improve Ability to disclose and discuss suicidal ideas Ability to maintain clinical measurements within normal limits will improve Ability to identify triggers associated with substance abuse/mental health issues will improve Ability to verbalize feelings will improve Ability to disclose and discuss  suicidal ideas Ability to identify and develop effective coping behaviors will improve  Medication Management: Evaluate patient's response, side effects, and tolerance of medication regimen.  Therapeutic Interventions: 1 to 1 sessions, Unit Group sessions and Medication administration.  Evaluation of Outcomes: Appropriate for discharge   Physician Treatment Plan for Secondary Diagnosis: Principal Problem:   MDD (major depressive disorder), severe (HCC) Active Problems:   Cocaine use disorder, mild, abuse (HCC)  Long Term Goal(s): Improvement in symptoms so as ready for discharge Improvement in symptoms so as ready for discharge   Short Term Goals: Ability to identify changes in lifestyle to reduce recurrence of condition will improve Ability to disclose and discuss suicidal ideas Ability to maintain clinical measurements within normal limits will improve Ability to identify triggers associated with substance abuse/mental health issues will improve Ability to verbalize feelings will improve Ability to disclose and discuss suicidal ideas Ability to identify and develop effective coping behaviors will improve     Medication Management: Evaluate patient's response, side effects, and tolerance of medication regimen.  Therapeutic Interventions: 1 to 1 sessions, Unit Group sessions and Medication administration.  Evaluation of Outcomes: Appropriate for discharge    RN Treatment Plan for Primary Diagnosis: MDD (major depressive disorder), severe (HCC) Long Term Goal(s): Knowledge of disease and therapeutic regimen to maintain health will improve  Short Term Goals: Ability to remain free from injury will improve, Ability to disclose and discuss suicidal ideas and Ability to identify and develop effective coping behaviors will improve  Medication Management: RN will administer medications as ordered by provider, will assess and evaluate patient's response and provide education to patient  for prescribed medication. RN will report any adverse and/or side effects to prescribing provider.  Therapeutic Interventions: 1 on 1 counseling sessions, Psychoeducation, Medication administration, Evaluate responses to treatment, Monitor vital signs and CBGs as ordered, Perform/monitor CIWA, COWS, AIMS and Fall Risk screenings as ordered, Perform wound care treatments as ordered.  Evaluation of Outcomes: Appropriate for discharge    LCSW Treatment Plan for Primary Diagnosis: MDD (major depressive disorder), severe (HCC) Long Term Goal(s): Safe transition to appropriate next level of care at discharge, Engage patient in therapeutic group addressing interpersonal concerns.  Short Term Goals: Engage patient in aftercare planning with referrals and resources, Facilitate patient progression through stages of change regarding substance use diagnoses and concerns and Identify triggers associated with mental health/substance abuse issues  Therapeutic Interventions: Assess for all discharge needs, 1 to 1 time with Social worker, Explore available resources and support systems, Assess for adequacy in community support network, Educate family and significant other(s) on suicide prevention, Complete Psychosocial Assessment, Interpersonal group therapy.  Evaluation of Outcomes: Appropriate for discharge    Progress in Treatment: Attending groups: Yes. Participating in groups: Yes. Taking medication as prescribed: Yes. Toleration medication: Yes. Family/Significant other contact made: SPE completed with pt's wife.  Patient understands diagnosis: Yes. Discussing patient identified problems/goals with staff: Yes. Medical problems stabilized or resolved: Yes. Denies suicidal/homicidal  ideation: Yes. Issues/concerns per patient self-inventory: Yes. Other: n/a   New problem(s) identified: No, Describe:  n/a  New Short Term/Long Term Goal(s): detox, medication management for mood stabilization;  elimination of SI thoughts; development of comprehensive mental wellness/sobriety plan.   Patient Goal: "to get into a halfway house when I leave the hosptial and stay off cocaine."  Discharge Plan or Barriers:  Pt plans [facility] at discharge. He will resume services at  [facility] for methadone maintenance and SAIOP. Pt provided with [facility] pamphlet and AA/NA information for additional community support.   Reason for Continuation of Hospitalization: none  Estimated Length of Stay: Friday, 12/23/17  Attendees: Patient:  12/23/2017 8:46 AM  Physician: Dr. Jama Flavors MD; Dr. Altamese Moncure MD 12/23/2017 8:46 AM  Nursing: Erskine Squibb RN; Clydie Braun RN 12/23/2017 8:46 AM  RN Care Manager:x 12/23/2017 8:46 AM  Social Worker: Chartered loss adjuster, LCSW 12/23/2017 8:46 AM  Recreational Therapist: x 12/23/2017 8:46 AM  Other: Armandina Stammer NP 12/23/2017 8:46 AM  Other:  12/23/2017 8:46 AM  Other: 12/23/2017 8:46 AM    Scribe for Treatment Team: Ledell Peoples Smart, LCSW 12/23/2017 8:46 AM

## 2017-12-29 DIAGNOSIS — F1120 Opioid dependence, uncomplicated: Secondary | ICD-10-CM | POA: Diagnosis not present

## 2018-01-17 DIAGNOSIS — Z79891 Long term (current) use of opiate analgesic: Secondary | ICD-10-CM | POA: Diagnosis not present

## 2018-01-24 DIAGNOSIS — Z79891 Long term (current) use of opiate analgesic: Secondary | ICD-10-CM | POA: Diagnosis not present

## 2018-01-30 DIAGNOSIS — Z79891 Long term (current) use of opiate analgesic: Secondary | ICD-10-CM | POA: Diagnosis not present

## 2018-02-06 DIAGNOSIS — F1120 Opioid dependence, uncomplicated: Secondary | ICD-10-CM | POA: Diagnosis not present

## 2018-02-21 DIAGNOSIS — Z79891 Long term (current) use of opiate analgesic: Secondary | ICD-10-CM | POA: Diagnosis not present

## 2018-02-23 ENCOUNTER — Other Ambulatory Visit: Payer: Self-pay | Admitting: *Deleted

## 2018-02-23 NOTE — Patient Outreach (Signed)
[facility] Care Management  02/23/2018  Scott [name] [birth year] [id number]   Transition of Care Referral    Referral Date: 02/21/18 Referral Source: [facility] IP discharges  Date of Admission: unknown at this time  Diagnosis: unknown at this time Date of Discharge:02/20/18  Facility[facility]  Insurance: [employer]   Outreach attempt # 1 No answer. [facility] RN CM left HIPAA compliant voicemail message along with CM's contact info.   Plan: [facility] CM sent an unsuccessful outreach letter and scheduled this patient for another call attempt within 4 business days   Stanly Si L. Noelle PennerGibbs, RN, BSN, CCM [facility] Telephonic Care Management Care Coordinator Direct number [phone]  [facility] number [phone] Fax number [phone]

## 2018-02-24 ENCOUNTER — Ambulatory Visit: Payer: Self-pay | Admitting: *Deleted

## 2018-02-27 ENCOUNTER — Ambulatory Visit: Payer: Self-pay | Admitting: *Deleted

## 2018-03-01 ENCOUNTER — Ambulatory Visit: Payer: Self-pay | Admitting: *Deleted

## 2018-03-05 ENCOUNTER — Encounter (HOSPITAL_COMMUNITY): Payer: Self-pay | Admitting: Emergency Medicine

## 2018-03-05 ENCOUNTER — Emergency Department (HOSPITAL_COMMUNITY)
Admission: EM | Admit: 2018-03-05 | Discharge: 2018-03-06 | Disposition: A | Discharge status: Home / Self Care | Payer: PPO | Attending: Emergency Medicine | Admitting: Emergency Medicine

## 2018-03-05 ENCOUNTER — Other Ambulatory Visit: Payer: Self-pay

## 2018-03-05 DIAGNOSIS — R443 Hallucinations, unspecified: Secondary | ICD-10-CM | POA: Diagnosis not present

## 2018-03-05 DIAGNOSIS — F1410 Cocaine abuse, uncomplicated: Secondary | ICD-10-CM | POA: Insufficient documentation

## 2018-03-05 DIAGNOSIS — F4310 Post-traumatic stress disorder, unspecified: Secondary | ICD-10-CM | POA: Insufficient documentation

## 2018-03-05 DIAGNOSIS — F1010 Alcohol abuse, uncomplicated: Secondary | ICD-10-CM | POA: Diagnosis not present

## 2018-03-05 DIAGNOSIS — Z79899 Other long term (current) drug therapy: Secondary | ICD-10-CM | POA: Diagnosis not present

## 2018-03-05 DIAGNOSIS — Z91040 Latex allergy status: Secondary | ICD-10-CM | POA: Diagnosis not present

## 2018-03-05 DIAGNOSIS — R45851 Suicidal ideations: Secondary | ICD-10-CM | POA: Diagnosis not present

## 2018-03-05 DIAGNOSIS — T50992A Poisoning by other drugs, medicaments and biological substances, intentional self-harm, initial encounter: Secondary | ICD-10-CM | POA: Diagnosis not present

## 2018-03-05 DIAGNOSIS — F333 Major depressive disorder, recurrent, severe with psychotic symptoms: Secondary | ICD-10-CM | POA: Insufficient documentation

## 2018-03-05 DIAGNOSIS — I252 Old myocardial infarction: Secondary | ICD-10-CM | POA: Insufficient documentation

## 2018-03-05 DIAGNOSIS — F17210 Nicotine dependence, cigarettes, uncomplicated: Secondary | ICD-10-CM | POA: Diagnosis not present

## 2018-03-05 LAB — CBC
HCT: 45.2 % (ref 39.0–52.0)
Hemoglobin: 15.5 g/dL (ref 13.0–17.0)
MCH: 31.2 pg (ref 26.0–34.0)
MCHC: 34.3 g/dL (ref 30.0–36.0)
MCV: 90.9 fL (ref 78.0–100.0)
Platelets: 166 10*3/uL (ref 150–400)
RBC: 4.97 MIL/uL (ref 4.22–5.81)
RDW: 13.1 % (ref 11.5–15.5)
WBC: 7 10*3/uL (ref 4.0–10.5)

## 2018-03-05 LAB — COMPREHENSIVE METABOLIC PANEL
ALT: 191 U/L — ABNORMAL HIGH (ref 0–44)
AST: 144 U/L — ABNORMAL HIGH (ref 15–41)
Albumin: 3.5 g/dL (ref 3.5–5.0)
Alkaline Phosphatase: 126 U/L (ref 38–126)
Anion gap: 9 (ref 5–15)
BUN: 9 mg/dL (ref 6–20)
CO2: 27 mmol/L (ref 22–32)
Calcium: 8.7 mg/dL — ABNORMAL LOW (ref 8.9–10.3)
Chloride: 106 mmol/L (ref 98–111)
Creatinine, Ser: 0.71 mg/dL (ref 0.61–1.24)
GFR calc Af Amer: >60 mL/min (ref >60)
GFR calc non Af Amer: >60 mL/min (ref >60)
Glucose, Bld: 119 mg/dL — ABNORMAL HIGH (ref 70–99)
Potassium: 3.4 mmol/L — ABNORMAL LOW (ref 3.5–5.1)
Sodium: 142 mmol/L (ref 135–145)
Total Bilirubin: 1 mg/dL (ref 0.3–1.2)
Total Protein: 6.6 g/dL (ref 6.5–8.1)

## 2018-03-05 LAB — ETHANOL: Alcohol, Ethyl (B): <10 mg/dL (ref <10)

## 2018-03-05 LAB — RAPID URINE DRUG SCREEN, HOSP PERFORMED
Amphetamines: NOT DETECTED
Barbiturates: NOT DETECTED
Benzodiazepines: NOT DETECTED
Cocaine: POSITIVE — AB
Opiates: NOT DETECTED
Tetrahydrocannabinol: NOT DETECTED

## 2018-03-05 LAB — SALICYLATE LEVEL: Salicylate Lvl: <7 mg/dL (ref 2.8–30.0)

## 2018-03-05 LAB — ACETAMINOPHEN LEVEL: Acetaminophen (Tylenol), Serum: <10 ug/mL — ABNORMAL LOW (ref 10–30)

## 2018-03-05 MED ORDER — LORAZEPAM 1 MG PO TABS: 0.0000 mg | ORAL_TABLET | Freq: Two times a day (BID) | ORAL | Status: DC

## 2018-03-05 MED ORDER — VITAMIN B-1 100 MG PO TABS
100.0000 mg | ORAL_TABLET | Freq: Every day | ORAL | Status: DC
  Administered 2018-03-05 – 2018-03-06 (×2): 100 mg via ORAL
  Filled 2018-03-05 (×2): qty 1

## 2018-03-05 MED ORDER — LORAZEPAM 2 MG/ML IJ SOLN: 0.0000 mg | Freq: Four times a day (QID) | INTRAMUSCULAR | Status: DC

## 2018-03-05 MED ORDER — LORAZEPAM 2 MG/ML IJ SOLN: 0.0000 mg | Freq: Two times a day (BID) | INTRAMUSCULAR | Status: DC

## 2018-03-05 MED ORDER — LORAZEPAM 1 MG PO TABS
0.0000 mg | ORAL_TABLET | Freq: Four times a day (QID) | ORAL | Status: DC
  Administered 2018-03-05 – 2018-03-06 (×2): 1 mg via ORAL
  Administered 2018-03-06: 2 mg via ORAL
  Filled 2018-03-05 (×2): qty 1
  Filled 2018-03-05: qty 2

## 2018-03-05 MED ORDER — THIAMINE HCL 100 MG/ML IJ SOLN: 100.0000 mg | Freq: Every day | INTRAMUSCULAR | Status: DC

## 2018-03-05 NOTE — ED Notes (Signed)
Regular Diet was ordered for Dinner. Patient was given a [place]urkey Sandwich bag and a Drink until Sara LeeDinner comes.

## 2018-03-05 NOTE — ED Notes (Addendum)
TTS machine placed in pt room waiting on call. Verbally confirmed with pt he understood the TTS process.

## 2018-03-05 NOTE — ED Triage Notes (Signed)
Belongings placed in Locker #6

## 2018-03-05 NOTE — ED Notes (Signed)
Pt in Pod B hallway with family member.  Awaiting EDP eval.

## 2018-03-05 NOTE — ED Notes (Signed)
[facility].

## 2018-03-05 NOTE — ED Triage Notes (Signed)
Pt's wife found him attempting to take an entire bottle of Seroquel attempting to end his life b/c "I feel like a burden to everybody..  I can't stop drinking."  Pt drank 2 5th and a case of beer last night.  Pt does have a tremor to his hands and hx of seizures when withdrawaling. Endorses crying and feelings of worthlessness.  Pt has chronic back pain and is involved w/ [place] clinic [place].
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
## 2018-03-05 NOTE — ED Notes (Signed)
Magda PaganiniAudrey, RN notified of pt being cleared for [facility].  Family member notified of [facility] visitor policy and visiting hours.  Both pt and family member verbalized understanding.  Magda Paganiniudrey notified that belongings were placed in locker by triage and have not been inventoried.  Per pt he did not have any valuables "only clothing".
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
## 2018-03-05 NOTE — ED Notes (Signed)
Per TTS, recommends IP

## 2018-03-06 ENCOUNTER — Inpatient Hospital Stay (HOSPITAL_COMMUNITY)
Admission: AD | Admit: 2018-03-06 | Discharge: 2018-03-17 | DRG: 885 | Disposition: A | Discharge status: Home / Self Care | Payer: PPO | Source: Intra-hospital | Attending: Psychiatry | Admitting: Psychiatry

## 2018-03-06 ENCOUNTER — Other Ambulatory Visit: Payer: Self-pay

## 2018-03-06 ENCOUNTER — Other Ambulatory Visit: Payer: Self-pay | Admitting: *Deleted

## 2018-03-06 ENCOUNTER — Encounter (HOSPITAL_COMMUNITY): Payer: Self-pay | Admitting: *Deleted

## 2018-03-06 DIAGNOSIS — G4700 Insomnia, unspecified: Secondary | ICD-10-CM | POA: Diagnosis not present

## 2018-03-06 DIAGNOSIS — R748 Abnormal levels of other serum enzymes: Secondary | ICD-10-CM

## 2018-03-06 DIAGNOSIS — Z91040 Latex allergy status: Secondary | ICD-10-CM

## 2018-03-06 DIAGNOSIS — F1490 Cocaine use, unspecified, uncomplicated: Secondary | ICD-10-CM | POA: Diagnosis present

## 2018-03-06 DIAGNOSIS — K219 Gastro-esophageal reflux disease without esophagitis: Secondary | ICD-10-CM | POA: Diagnosis not present

## 2018-03-06 DIAGNOSIS — K760 Fatty (change of) liver, not elsewhere classified: Secondary | ICD-10-CM | POA: Diagnosis not present

## 2018-03-06 DIAGNOSIS — F1590 Other stimulant use, unspecified, uncomplicated: Secondary | ICD-10-CM | POA: Diagnosis present

## 2018-03-06 DIAGNOSIS — F259 Schizoaffective disorder, unspecified: Secondary | ICD-10-CM | POA: Diagnosis present

## 2018-03-06 DIAGNOSIS — Z915 Personal history of self-harm: Secondary | ICD-10-CM

## 2018-03-06 DIAGNOSIS — Z888 Allergy status to other drugs, medicaments and biological substances status: Secondary | ICD-10-CM | POA: Diagnosis not present

## 2018-03-06 DIAGNOSIS — F1024 Alcohol dependence with alcohol-induced mood disorder: Secondary | ICD-10-CM | POA: Diagnosis not present

## 2018-03-06 DIAGNOSIS — Z8249 Family history of ischemic heart disease and other diseases of the circulatory system: Secondary | ICD-10-CM

## 2018-03-06 DIAGNOSIS — Z886 Allergy status to analgesic agent status: Secondary | ICD-10-CM

## 2018-03-06 DIAGNOSIS — Z818 Family history of other mental and behavioral disorders: Secondary | ICD-10-CM

## 2018-03-06 DIAGNOSIS — I252 Old myocardial infarction: Secondary | ICD-10-CM | POA: Diagnosis not present

## 2018-03-06 DIAGNOSIS — F1290 Cannabis use, unspecified, uncomplicated: Secondary | ICD-10-CM | POA: Diagnosis present

## 2018-03-06 DIAGNOSIS — F17210 Nicotine dependence, cigarettes, uncomplicated: Secondary | ICD-10-CM | POA: Diagnosis not present

## 2018-03-06 DIAGNOSIS — Z885 Allergy status to narcotic agent status: Secondary | ICD-10-CM

## 2018-03-06 DIAGNOSIS — F419 Anxiety disorder, unspecified: Secondary | ICD-10-CM | POA: Diagnosis present

## 2018-03-06 DIAGNOSIS — G40909 Epilepsy, unspecified, not intractable, without status epilepticus: Secondary | ICD-10-CM | POA: Diagnosis not present

## 2018-03-06 DIAGNOSIS — F332 Major depressive disorder, recurrent severe without psychotic features: Principal | ICD-10-CM | POA: Diagnosis present

## 2018-03-06 DIAGNOSIS — F1020 Alcohol dependence, uncomplicated: Secondary | ICD-10-CM | POA: Diagnosis not present

## 2018-03-06 DIAGNOSIS — Z836 Family history of other diseases of the respiratory system: Secondary | ICD-10-CM | POA: Diagnosis not present

## 2018-03-06 DIAGNOSIS — R935 Abnormal findings on diagnostic imaging of other abdominal regions, including retroperitoneum: Secondary | ICD-10-CM | POA: Diagnosis not present

## 2018-03-06 DIAGNOSIS — K838 Other specified diseases of biliary tract: Secondary | ICD-10-CM | POA: Diagnosis not present

## 2018-03-06 DIAGNOSIS — F1010 Alcohol abuse, uncomplicated: Secondary | ICD-10-CM | POA: Diagnosis not present

## 2018-03-06 DIAGNOSIS — G894 Chronic pain syndrome: Secondary | ICD-10-CM | POA: Diagnosis present

## 2018-03-06 DIAGNOSIS — Z79899 Other long term (current) drug therapy: Secondary | ICD-10-CM

## 2018-03-06 DIAGNOSIS — F4310 Post-traumatic stress disorder, unspecified: Secondary | ICD-10-CM | POA: Diagnosis not present

## 2018-03-06 DIAGNOSIS — R45851 Suicidal ideations: Secondary | ICD-10-CM | POA: Diagnosis not present

## 2018-03-06 DIAGNOSIS — F1190 Opioid use, unspecified, uncomplicated: Secondary | ICD-10-CM | POA: Diagnosis not present

## 2018-03-06 DIAGNOSIS — R7989 Other specified abnormal findings of blood chemistry: Secondary | ICD-10-CM

## 2018-03-06 DIAGNOSIS — R945 Abnormal results of liver function studies: Secondary | ICD-10-CM | POA: Diagnosis not present

## 2018-03-06 DIAGNOSIS — B182 Chronic viral hepatitis C: Secondary | ICD-10-CM | POA: Diagnosis not present

## 2018-03-06 MED ORDER — PANTOPRAZOLE SODIUM 40 MG PO TBEC
40.0000 mg | DELAYED_RELEASE_TABLET | Freq: Every day | ORAL | Status: DC
  Administered 2018-03-06: 40 mg via ORAL
  Filled 2018-03-06: qty 1

## 2018-03-06 MED ORDER — VITAMIN B-1 100 MG PO TABS
100.0000 mg | ORAL_TABLET | Freq: Every day | ORAL | Status: DC
  Administered 2018-03-07 – 2018-03-17 (×11): 100 mg via ORAL
  Filled 2018-03-06 (×13): qty 1

## 2018-03-06 MED ORDER — LORAZEPAM 1 MG PO TABS: 1.0000 mg | ORAL_TABLET | Freq: Every day | ORAL | Status: DC

## 2018-03-06 MED ORDER — LORAZEPAM 1 MG PO TABS
1.0000 mg | ORAL_TABLET | Freq: Three times a day (TID) | ORAL | Status: AC
  Administered 2018-03-08 – 2018-03-09 (×3): 1 mg via ORAL
  Filled 2018-03-06 (×3): qty 1

## 2018-03-06 MED ORDER — SERTRALINE HCL 25 MG PO TABS
75.0000 mg | ORAL_TABLET | Freq: Every day | ORAL | Status: DC
  Administered 2018-03-06: 75 mg via ORAL
  Filled 2018-03-06: qty 1

## 2018-03-06 MED ORDER — METHADONE HCL 40 MG PO TBSO: 75.0000 mg | ORAL_TABLET | Freq: Every day | ORAL | Status: DC

## 2018-03-06 MED ORDER — ACETAMINOPHEN 325 MG PO TABS
650.0000 mg | ORAL_TABLET | Freq: Four times a day (QID) | ORAL | Status: DC | PRN
Start: 2018-03-06 — End: 2018-03-13
  Administered 2018-03-07: 650 mg via ORAL
  Filled 2018-03-06: qty 2

## 2018-03-06 MED ORDER — HYDROXYZINE HCL 25 MG PO TABS
25.0000 mg | ORAL_TABLET | Freq: Four times a day (QID) | ORAL | Status: DC | PRN
  Administered 2018-03-09 – 2018-03-16 (×7): 25 mg via ORAL
  Filled 2018-03-06 (×9): qty 1

## 2018-03-06 MED ORDER — ONDANSETRON 4 MG PO TBDP: 4.0000 mg | ORAL_TABLET | Freq: Four times a day (QID) | ORAL | Status: AC | PRN

## 2018-03-06 MED ORDER — BUSPIRONE HCL 10 MG PO TABS
15.0000 mg | ORAL_TABLET | Freq: Three times a day (TID) | ORAL | Status: DC
  Administered 2018-03-06: 15 mg via ORAL
  Filled 2018-03-06: qty 2

## 2018-03-06 MED ORDER — METHADONE HCL 5 MG PO TABS
75.0000 mg | ORAL_TABLET | Freq: Every day | ORAL | Status: DC
  Administered 2018-03-07 – 2018-03-13 (×7): 75 mg via ORAL
  Filled 2018-03-06 (×7): qty 7

## 2018-03-06 MED ORDER — METHADONE HCL 10 MG PO TABS
75.0000 mg | ORAL_TABLET | Freq: Every day | ORAL | Status: DC
  Administered 2018-03-06: 75 mg via ORAL
  Filled 2018-03-06: qty 8

## 2018-03-06 MED ORDER — LORAZEPAM 1 MG PO TABS
1.0000 mg | ORAL_TABLET | Freq: Four times a day (QID) | ORAL | Status: AC
  Administered 2018-03-06 – 2018-03-08 (×5): 1 mg via ORAL
  Filled 2018-03-06 (×5): qty 1

## 2018-03-06 MED ORDER — QUETIAPINE FUMARATE 50 MG PO TABS: 100.0000 mg | ORAL_TABLET | Freq: Every day | ORAL | Status: DC

## 2018-03-06 MED ORDER — LORAZEPAM 1 MG PO TABS: 1.0000 mg | ORAL_TABLET | Freq: Four times a day (QID) | ORAL | Status: DC | PRN

## 2018-03-06 MED ORDER — LORAZEPAM 1 MG PO TABS: 1.0000 mg | ORAL_TABLET | Freq: Two times a day (BID) | ORAL | Status: DC

## 2018-03-06 MED ORDER — PANTOPRAZOLE SODIUM 40 MG PO TBEC
40.0000 mg | DELAYED_RELEASE_TABLET | Freq: Every day | ORAL | Status: DC
Start: 2018-03-07 — End: 2018-03-08
  Administered 2018-03-07 – 2018-03-08 (×2): 40 mg via ORAL
  Filled 2018-03-06 (×4): qty 1

## 2018-03-06 MED ORDER — LOPERAMIDE HCL 2 MG PO CAPS: 2.0000 mg | ORAL_CAPSULE | ORAL | Status: AC | PRN

## 2018-03-06 MED ORDER — QUETIAPINE FUMARATE 100 MG PO TABS
100.0000 mg | ORAL_TABLET | Freq: Every day | ORAL | Status: DC
  Administered 2018-03-06 – 2018-03-09 (×4): 100 mg via ORAL
  Filled 2018-03-06 (×6): qty 1

## 2018-03-06 MED ORDER — ADULT MULTIVITAMIN W/MINERALS CH
1.0000 | ORAL_TABLET | Freq: Every day | ORAL | Status: DC
  Administered 2018-03-07 – 2018-03-17 (×11): 1 via ORAL
  Filled 2018-03-06 (×13): qty 1

## 2018-03-06 MED ORDER — BENZTROPINE MESYLATE 1 MG PO TABS
1.0000 mg | ORAL_TABLET | Freq: Two times a day (BID) | ORAL | Status: DC
  Administered 2018-03-06: 1 mg via ORAL
  Filled 2018-03-06: qty 1

## 2018-03-06 MED ORDER — PRAZOSIN HCL 2 MG PO CAPS: 2.0000 mg | ORAL_CAPSULE | Freq: Every day | ORAL | Status: DC

## 2018-03-06 MED ORDER — ENSURE ENLIVE PO LIQD
237.0000 mL | Freq: Two times a day (BID) | ORAL | Status: DC
  Administered 2018-03-07: 237 mL via ORAL

## 2018-03-06 MED ORDER — MAGNESIUM HYDROXIDE 400 MG/5ML PO SUSP: 30.0000 mL | Freq: Every day | ORAL | Status: DC | PRN

## 2018-03-06 MED ORDER — PRAZOSIN HCL 2 MG PO CAPS
2.0000 mg | ORAL_CAPSULE | Freq: Every day | ORAL | Status: DC
  Administered 2018-03-06 – 2018-03-08 (×3): 2 mg via ORAL
  Filled 2018-03-06: qty 2
  Filled 2018-03-06 (×3): qty 1
  Filled 2018-03-06: qty 2
  Filled 2018-03-06: qty 1

## 2018-03-06 NOTE — Progress Notes (Addendum)
[facility]  Donell SievertSpencer Simon, PA is the accepting provider.  Dr. Nehemiah MassedFernando Cobos is the attending provider.  Call report to [phone]  [facility] notified.   Pt is Voluntary.  Pt may be transported by [facility]  [facility] once methadone dose is determined.  Timmothy EulerJean T. Kaylyn LimSutter, MSW, LCSWA Disposition Clinical Social Work [phone] (cell) [phone] (office)

## 2018-03-06 NOTE — ED Triage Notes (Signed)
[facility] requested [facility] to arrive after 1300. [facility] transportation called for transport after 1300.

## 2018-03-06 NOTE — ED Notes (Signed)
Notified Dr. Effie ShyWentz patient requesting daily medications be ordered. We are still waiting for methadone dose to be verified. I attempted to call and verify dose but received voicemail. Notified pharmacy who will continue to try to verify dose.
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
## 2018-03-06 NOTE — ED Triage Notes (Addendum)
Methadone dose confirmed by  Med . Rec. Staff. No dose seen . Methadone dose listed in Medication Hx. EDP Effie ShyWentz called to place orders for daily meds.

## 2018-03-06 NOTE — Progress Notes (Signed)
[facility]. 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  The dose was verified at [facility].

## 2018-03-06 NOTE — BHH Counselor (Signed)
TTS accepted to tele-psych patient for re-assessment. Informed computer system was down, waiting on IT to come look at the system. TTS will be called when able to see the patient.

## 2018-03-06 NOTE — ED Notes (Signed)
Regular diet was ordered for breakfast. 

## 2018-03-06 NOTE — B[facility] Counselor (Signed)
TTS no longer is required to see patient. [facility] .

## 2018-03-06 NOTE — Patient Outreach (Addendum)
[facility]) Care Management  03/06/2018  Scott [name] 1970/10/03 [id number]   Transition of Care Referral   Referral Date: 02/21/18 Referral Source: HTA IP discharges  Date of Admission: unknown at this time  Diagnosis: unknown at this time Date of Discharge:02/20/18  Facility:  [facility]  Insurance: HTA   Outreach attempt # 2 [facility] RN CM left HIPAA compliant voicemail message along with CM's contact info with a male.  [facility] CM sent an unsuccessful outreach letter on 02/23/18 but no response Cm noting pt with a re admission [facility] ED [facility]  [facility] CM updated [facility] hospital liaison of re admission CM will close this case at this time because patient has re admitted to hospital and CM unable to contact  [place]ush[place]Kimberly L. Noelle PennerGibbs, RN, BSN, CCM [facility] Telephonic Care Management Care Coordinator Direct number [phone]  [facility] number [phone] Fax number [phone]

## 2018-03-06 NOTE — Progress Notes (Signed)
Patient ID: Scott [name], male   DOB: [birth year], 47 y.o.   MRN: [id number]   D: Patient pleasant on approach. Giving gatorade to increase fluids due to withdrawals. 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 Presenting reports doesn't feel safe off the 1:1 and cannot completely contract for safety. Still reporting passive thoughts on and off since being here. A: Staff will monitor on 1:1 for safety, follow treatment plan, and give medications as ordered. R: Will talk to provider about ordering regular medication and ativan protocol. No attempts to harm self at present.

## 2018-03-06 NOTE — Progress Notes (Signed)
1:1 initial note: Patient came to nursing station requesting to see his nurse.  Patient states, "I'm feeling suicidal."  Patient states that he "cannot contract for safety."  He states that he sees "my dad and I know this is a delusion, but he beckons me to come with him."  Patient does not have a specific plan, however, feels that he needs a staff member with him to feel safe.  Received order from Dr. Jama Flavorsobos for continuous 1:1 for safety concern.

## 2018-03-07 DIAGNOSIS — F332 Major depressive disorder, recurrent severe without psychotic features: Principal | ICD-10-CM

## 2018-03-07 DIAGNOSIS — R45851 Suicidal ideations: Secondary | ICD-10-CM

## 2018-03-07 DIAGNOSIS — Z818 Family history of other mental and behavioral disorders: Secondary | ICD-10-CM

## 2018-03-07 DIAGNOSIS — F1490 Cocaine use, unspecified, uncomplicated: Secondary | ICD-10-CM

## 2018-03-07 DIAGNOSIS — F17210 Nicotine dependence, cigarettes, uncomplicated: Secondary | ICD-10-CM

## 2018-03-07 DIAGNOSIS — F4310 Post-traumatic stress disorder, unspecified: Secondary | ICD-10-CM

## 2018-03-07 MED ORDER — NICOTINE 21 MG/24HR TD PT24
21.0000 mg | MEDICATED_PATCH | Freq: Every day | TRANSDERMAL | Status: DC
  Administered 2018-03-07 – 2018-03-17 (×11): 21 mg via TRANSDERMAL
  Filled 2018-03-07 (×13): qty 1

## 2018-03-07 MED ORDER — BUSPIRONE HCL 10 MG PO TABS
10.0000 mg | ORAL_TABLET | Freq: Two times a day (BID) | ORAL | Status: DC
  Administered 2018-03-07 – 2018-03-10 (×7): 10 mg via ORAL
  Filled 2018-03-07: qty 2
  Filled 2018-03-07: qty 1
  Filled 2018-03-07: qty 2
  Filled 2018-03-07 (×9): qty 1

## 2018-03-07 MED ORDER — SERTRALINE HCL 25 MG PO TABS
25.0000 mg | ORAL_TABLET | Freq: Every day | ORAL | Status: DC
  Administered 2018-03-07 – 2018-03-09 (×3): 25 mg via ORAL
  Filled 2018-03-07 (×5): qty 1

## 2018-03-07 NOTE — [facility] Group Notes (Signed)
Pt attended spiritual care group on grief and loss facilitated by chaplain Scott [name]   Group opened with brief discussion and psycho-social ed around grief and loss in relationships and in relation to self - identifying life patterns, circumstances, changes that cause losses. Established group norm of speaking from own life experience. Group goal of establishing open and affirming space for members to share loss and experience with grief, normalize grief experience and provide psycho social education and grief support.  Group engaged in discussion around tasks of grief.   Scott [name] was present through group with exception of being asked to step out to speak with care team.  He was lethargic and did not engage in conversation.  Approached facilitator afterward and apologized for sleeping in group, stating he was hopeful to find a medication regimen that allowed him to have more energy.  Stated his desire to stay awake in groups.

## 2018-03-07 NOTE — [facility] Group Notes (Signed)
[facility] Group Notes:  (Nursing/MHT/Case Management/Adjunct)  Date:  03/07/2018  Time:  4:00 PM  Type of Therapy:  Nurse Education  Participation Level:  Active  Participation Quality:  Appropriate and Sharing  Affect:  Appropriate  Cognitive:  Appropriate  Insight:  Appropriate  Engagement in Group:  Developing/Improving and Engaged  Modes of Intervention:  Problem-solving and Socialization  Summary of Progress/Problems: Patient was attentive and supportive when other patients were sharing their stories.  Scott [name] 03/07/2018, 4:55 PM 

## 2018-03-07 NOTE — Progress Notes (Signed)
Recreation Therapy Notes  Animal-Assisted Activity (AAA) Program Checklist/Progress Notes Patient Eligibility Criteria Checklist & Daily Group note for Rec Tx Intervention  Date: 7.30.19 Time: 1430 Location: 400 Hall Dayroom   AAA/T Program Assumption of Risk Form signed by Patient/ or Parent Legal Guardian YES   Patient is free of allergies or sever asthma YES  Patient reports no fear of animals  YES  Patient reports no history of cruelty to animals  YES  Patient understands his/her participation is voluntary  YES   Patient washes hands before animal contact  YES   Patient washes hands after animal contact  YES   Behavioral Response: Engaged  Education: Hand Washing, Appropriate Animal Interaction   Education Outcome: Acknowledges understanding/In group clarification offered/Needs additional education.   Clinical Observations/Feedback: Pt attended and participated in group.    Scott [name], LRT/CTRS         Scott [name] 03/07/2018 3:52 PM 
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
## 2018-03-07 NOTE — Progress Notes (Signed)
1:1 Note Pt observed this morning on the whole way with gait steady, pt reported good sleep last night, denied SI/AV and contracted for safety, remains on 1:1, will continue to monitor.

## 2018-03-07 NOTE — Progress Notes (Signed)
[facility] Post 1:1 Observation Documentation  For the first (8) hours following discontinuation of 1:1 precautions, a progress note entry by nursing staff should be documented at least every 2 hours, reflecting the patient's behavior, condition, mood, and conversation.  Use the progress notes for additional entries.  Time 1:1 discontinued:  0928  Patient's Behavior:  calm  Patient's Condition:  Safe   Patient's Conversation:  Attending group   Bethann PunchesJane O Yanely Mast 03/07/2018, 6:36 PM

## 2018-03-07 NOTE — Progress Notes (Signed)
Nutrition Brief Note  Patient identified on the Malnutrition Screening Tool (MST) Report  Wt Readings from Last 15 Encounters:  03/06/18 265 lb (120.2 kg)  03/05/18 265 lb (120.2 kg)  12/16/17 267 lb (121.1 kg)  08/25/17 267 lb (121.1 kg)  06/07/17 265 lb (120.2 kg)  06/06/17 256 lb (116.1 kg)  03/18/16 268 lb (121.6 kg)  01/02/16 270 lb (122.5 kg)  07/25/15 286 lb (129.7 kg)  02/23/15 182 lb (82.6 kg)  02/16/15 290 lb (131.5 kg)  02/02/15 292 lb (132.5 kg)  12/26/14 299 lb (135.6 kg)  12/25/14 299 lb (135.6 kg)  12/12/14 (!) 314 lb (142.4 kg)    Body mass index is 35.94 kg/m. Patient meets criteria for obesity based on current BMI. Skin WDL. Patient admitted for SI and near SA.  Current diet order is Regular and patient is eating as desired for meals and snacks. Labs and medications reviewed.   No nutrition interventions warranted at this time. If nutrition issues arise, please consult RD.     Trenton GammonJessica Lateia Fraser, MS, RD, LDN, [facility] Inpatient Clinical Dietitian Pager # [phone] After hours/weekend pager # [phone]

## 2018-03-07 NOTE — Progress Notes (Signed)
[facility] Post 1:1 Observation Documentation  For the first (8) hours following discontinuation of 1:1 precautions, a progress note entry by nursing staff should be documented at least every 2 hours, reflecting the patient's behavior, condition, mood, and conversation.  Use the progress notes for additional entries.  Time 1:1 discontinued:  0928  Patient's Behavior: calm   Patient's Condition:  safe  Patient's Conversation:  Pt resting in bed at this time.  [name] 03/07/2018, 6:37 PM

## 2018-03-07 NOTE — Progress Notes (Signed)
D: Patient lying in bed with eyes closed. No distress noted.  A: No attempts to harm self while awake tonight. Staff will continue to monitor on 1:1 R: Appears asleep at present.

## 2018-03-07 NOTE — Progress Notes (Signed)
[facility] Post 1:1 Observation Documentation  For the first (8) hours following discontinuation of 1:1 precautions, a progress note entry by nursing staff should be documented at least every 2 hours, reflecting the patient's behavior, condition, mood, and conversation.  Use the progress notes for additional entries.  Time 1:1 discontinued:  0928  Patient's Behavior:  calm Patient's Condition:  safe  Patient's Conversation: Pt resting in bed at this time.   Bethann PunchesJane O Matti Minney 03/07/2018, 12:33 PM

## 2018-03-07 NOTE — Tx Team (Signed)
Initial Treatment Plan 03/07/2018 1:33 AM Scott KettleNelson [name] ZOX:[id number]    PATIENT STRESSORS: Financial difficulties Health problems Substance abuse Traumatic event   PATIENT STRENGTHS: Ability for insight Average or above average intelligence Capable of independent living Communication skills Motivation for treatment/growth Supportive family/friends   PATIENT IDENTIFIED PROBLEMS:   "suicidal thoughts"  " drinking a lot of alcohol"    Hx: Depression, PTSD, Polysubstance abuse             DISCHARGE CRITERIA:  Ability to meet basic life and health needs Adequate post-discharge living arrangements Reduction of life-threatening or endangering symptoms to within safe limits  PRELIMINARY DISCHARGE PLAN: Outpatient therapy Return to previous living arrangement  PATIENT/FAMILY INVOLVEMENT: This treatment plan has been presented to and reviewed with the patient, Scott [name], and/or family member, .  The patient and family have been given the opportunity to ask questions and make suggestions.  Scott [name], Scott Michelsen Hundley, RN 03/07/2018, 1:33 AM

## 2018-03-07 NOTE — Progress Notes (Signed)
[facility] Post 1:1 Observation Documentation  For the first (8) hours following discontinuation of 1:1 precautions, a progress note entry by nursing staff should be documented at least every 2 hours, reflecting the patient's behavior, condition, mood, and conversation.  Use the progress notes for additional entries.  Time 1:1 discontinued:  0928  Patient's Behavior: Calm    Patient's Condition:  safe  Patient's Conversation: " Doing fine and not having suicidal thoughts" Bethann PunchesJane O Rocklyn Mayberry 03/07/2018, 9:31 AM

## 2018-03-07 NOTE — Progress Notes (Signed)
[facility] Post 1:1 Observation Documentation  For the first (8) hours following discontinuation of 1:1 precautions, a progress note entry by nursing staff should be documented at least every 2 hours, reflecting the patient's behavior, condition, mood, and conversation.  Use the progress notes for additional entries.  Time 1:1 discontinued: 0928   Patient's Behavior:  calm  Patient's Condition:  safe  Patient's Conversation:  Ate lunch and enjoyed.it Bethann PunchesJane O Judith Campillo 03/07/2018, 2:40 PM

## 2018-03-07 NOTE — Plan of Care (Signed)
  Problem: Physical Regulation: Goal: Ability to maintain clinical measurements within normal limits will improve Outcome: Progressing   Problem: Coping: Goal: Will verbalize feelings Outcome: Progressing
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
## 2018-03-07 NOTE — Progress Notes (Signed)
D: Patient lying in bed with eyes closed. No distress noted.  A: No attempts to harm self during the night. Staff will continue to monitor on 1:1 for safety. R: Appears to be sleeping.

## 2018-03-08 MED ORDER — METHOCARBAMOL 500 MG PO TABS
500.0000 mg | ORAL_TABLET | Freq: Four times a day (QID) | ORAL | Status: DC | PRN
  Administered 2018-03-09 – 2018-03-12 (×8): 500 mg via ORAL
  Filled 2018-03-08 (×9): qty 1

## 2018-03-08 MED ORDER — PANTOPRAZOLE SODIUM 40 MG PO TBEC
40.0000 mg | DELAYED_RELEASE_TABLET | Freq: Every day | ORAL | Status: DC
  Administered 2018-03-08 – 2018-03-13 (×6): 40 mg via ORAL
  Filled 2018-03-08 (×8): qty 1

## 2018-03-08 MED ORDER — FAMOTIDINE 20 MG PO TABS
20.0000 mg | ORAL_TABLET | Freq: Two times a day (BID) | ORAL | Status: DC
  Administered 2018-03-08 – 2018-03-17 (×18): 20 mg via ORAL
  Filled 2018-03-08 (×24): qty 1

## 2018-03-08 NOTE — Progress Notes (Signed)
D:  Scott [name] has been up and visible on the unit.  He reported feeling a little better today than yesterday.  He denied SI/HI or A/V hallucinations.  He currently denied any withdrawal symptoms.  He denied any pain or discomfort and appeared to be in no physical distress.  He took his HS medication without difficulty.  No prns required at this time.   A:  1:1 with RN for support and encouragement.  Medications as ordered.  Q 15 minute checks maintained for safety.  Encouraged participation in group and unit activities.   R:  Scott [name] remains safe on the unit.  We will continue to monitor the progress towards his goals.

## 2018-03-08 NOTE — Progress Notes (Signed)
Pt presents with a flat affect and depressed mood. Pt appeared to be guarded on approach and forwarded little information. Pt reported decreased depression today. Pt denies SI/HI. Pt denies AVH. Pt reports withdrawal symptoms of: anxiety, chills, sweats and tremors. Pt administered Ativan per protocol for withdrawal symptoms. Pt reported fair sleep last night. Pt reports tolerating his meds well and denies any side effects.  Medications and orders reviewed with pt. Verbal support provided. Pt encouraged to attend groups. 15 minute checks performed for safety.  Pt compliant with tx plan.

## 2018-03-08 NOTE — Tx Team (Signed)
Interdisciplinary Treatment and Diagnostic Plan Update  03/08/2018 Time of Session: 10:10am Scott [name] MRN: [id number]  Principal Diagnosis: <principal problem not specified>  Secondary Diagnoses: Active Problems:   MDD (major depressive disorder), recurrent severe, without psychosis ([place])   Current Medications:  Current Facility-Administered Medications  Medication Dose Route Frequency Provider Last Rate Last Dose  . acetaminophen (TYLENOL) tablet 650 mg  650 mg Oral Q6H PRN Elmarie Shiley A, NP   650 mg at 03/07/18 1327  . busPIRone (BUSPAR) tablet 10 mg  10 mg Oral BID Sharma Covert, MD   10 mg at 03/08/18 0831  . hydrOXYzine (ATARAX/VISTARIL) tablet 25 mg  25 mg Oral Q6H PRN Elmarie Shiley A, NP      . loperamide (IMODIUM) capsule 2-4 mg  2-4 mg Oral PRN Lindon Romp A, NP      . LORazepam (ATIVAN) tablet 1 mg  1 mg Oral QID Lindon Romp A, NP   1 mg at 03/08/18 0819   Followed by  . LORazepam (ATIVAN) tablet 1 mg  1 mg Oral TID Rozetta Nunnery, NP       Followed by  . [START ON 03/09/2018] LORazepam (ATIVAN) tablet 1 mg  1 mg Oral BID Rozetta Nunnery, NP       Followed by  . [START ON 03/11/2018] LORazepam (ATIVAN) tablet 1 mg  1 mg Oral Daily Lindon Romp A, NP      . magnesium hydroxide (MILK OF MAGNESIA) suspension 30 mL  30 mL Oral Daily PRN Niel Hummer, NP      . methadone (DOLOPHINE) tablet 75 mg  75 mg Oral Daily Lindon Romp A, NP   75 mg at 03/08/18 0819  . methocarbamol (ROBAXIN) tablet 500 mg  500 mg Oral Q6H PRN Sharma Covert, MD      . multivitamin with minerals tablet 1 tablet  1 tablet Oral Daily Lindon Romp A, NP   1 tablet at 03/08/18 0819  . nicotine (NICODERM CQ - dosed in mg/24 hours) patch 21 mg  21 mg Transdermal Daily Lindon Romp A, NP   21 mg at 03/08/18 0865  . ondansetron (ZOFRAN-ODT) disintegrating tablet 4 mg  4 mg Oral Q6H PRN Lindon Romp A, NP      . pantoprazole (PROTONIX) EC tablet 40 mg  40 mg Oral Daily Lindon Romp A, NP   40 mg at  03/08/18 0820  . prazosin (MINIPRESS) capsule 2 mg  2 mg Oral QHS Lindon Romp A, NP   2 mg at 03/07/18 2132  . QUEtiapine (SEROQUEL) tablet 100 mg  100 mg Oral QHS Lindon Romp A, NP   100 mg at 03/07/18 2133  . sertraline (ZOLOFT) tablet 25 mg  25 mg Oral Daily Sharma Covert, MD   25 mg at 03/08/18 0820  . thiamine (VITAMIN B-1) tablet 100 mg  100 mg Oral Daily Lindon Romp A, NP   100 mg at 03/08/18 7846   PTA Medications: Medications Prior to Admission  Medication Sig Dispense Refill Last Dose  . acetaminophen (TYLENOL) 650 MG CR tablet Take 650 mg by mouth 2 (two) times daily.   03/05/2018 at am  . albuterol (PROVENTIL HFA;VENTOLIN HFA) 108 (90 Base) MCG/ACT inhaler Inhale 2 puffs into the lungs every 6 (six) hours as needed for wheezing or shortness of breath.   couple days ago  . benztropine (COGENTIN) 1 MG tablet Take 1 mg by mouth 2 (two) times daily.   03/05/2018 at am  .  busPIRone (BUSPAR) 10 MG tablet Take 1 tablet (10 mg total) by mouth 2 (two) times daily. (Patient not taking: Reported on 03/05/2018) 30 tablet 0 Not Taking at Unknown time  . busPIRone (BUSPAR) 15 MG tablet Take 15 mg by mouth 3 (three) times daily.   03/05/2018 at am  . DOCUSATE SODIUM PO Take 3 capsules by mouth daily.   03/05/2018 at am  . hydrOXYzine (ATARAX/VISTARIL) 25 MG tablet Take 1 tablet (25 mg total) by mouth every 6 (six) hours as needed for anxiety. (Patient not taking: Reported on 03/05/2018) 30 tablet 0 Not Taking at Unknown time  . METHADONE HCL PO Take 75 mg by mouth daily.    03/05/2018 at am  . Multiple Vitamin (MULTIVITAMIN WITH MINERALS) TABS tablet Take 1 tablet by mouth daily.   03/05/2018 at am  . nicotine (NICODERM CQ - DOSED IN MG/24 HOURS) 21 mg/24hr patch Place 21 mg onto the skin daily as needed (smoking cessation).   couple days ago  . pantoprazole (PROTONIX) 40 MG tablet Take 1 tablet (40 mg total) by mouth daily. 30 tablet 0 03/05/2018 at am  . prazosin (MINIPRESS) 2 MG capsule Take 1  capsule (2 mg total) by mouth at bedtime. 60 capsule 0 03/04/2018 at pm  . QUEtiapine (SEROQUEL) 100 MG tablet Take 100 mg by mouth at bedtime.   03/04/2018 at pm  . sertraline (ZOLOFT) 25 MG tablet Take 3 tablets (75 mg total) by mouth daily. (Patient taking differently: Take 25 mg by mouth 2 (two) times daily. ) 90 tablet 0 03/05/2018 at am    Patient Stressors: Financial difficulties Health problems Substance abuse Traumatic event  Patient Strengths: Ability for insight Average or above average intelligence Capable of independent living Armed forces logistics/support/administrative officer Motivation for treatment/growth Supportive family/friends  Treatment Modalities: Medication Management, Group therapy, Case management,  1 to 1 session with clinician, Psychoeducation, Recreational therapy.   Physician Treatment Plan for Primary Diagnosis: <principal problem not specified> Long Term Goal(s): Improvement in symptoms so as ready for discharge Improvement in symptoms so as ready for discharge   Short Term Goals: Ability to identify changes in lifestyle to reduce recurrence of condition will improve Ability to verbalize feelings will improve Ability to disclose and discuss suicidal ideas Ability to demonstrate self-control will improve Ability to identify and develop effective coping behaviors will improve Ability to maintain clinical measurements within normal limits will improve Compliance with prescribed medications will improve Ability to identify triggers associated with substance abuse/mental health issues will improve Ability to identify changes in lifestyle to reduce recurrence of condition will improve Ability to verbalize feelings will improve Ability to disclose and discuss suicidal ideas Ability to demonstrate self-control will improve Ability to identify and develop effective coping behaviors will improve Ability to maintain clinical measurements within normal limits will improve Compliance with  prescribed medications will improve Ability to identify triggers associated with substance abuse/mental health issues will improve  Medication Management: Evaluate patient's response, side effects, and tolerance of medication regimen.  Therapeutic Interventions: 1 to 1 sessions, Unit Group sessions and Medication administration.  Evaluation of Outcomes: Not Met  Physician Treatment Plan for Secondary Diagnosis: Active Problems:   MDD (major depressive disorder), recurrent severe, without psychosis ([place])  Long Term Goal(s): Improvement in symptoms so as ready for discharge Improvement in symptoms so as ready for discharge   Short Term Goals: Ability to identify changes in lifestyle to reduce recurrence of condition will improve Ability to verbalize feelings will improve Ability to  disclose and discuss suicidal ideas Ability to demonstrate self-control will improve Ability to identify and develop effective coping behaviors will improve Ability to maintain clinical measurements within normal limits will improve Compliance with prescribed medications will improve Ability to identify triggers associated with substance abuse/mental health issues will improve Ability to identify changes in lifestyle to reduce recurrence of condition will improve Ability to verbalize feelings will improve Ability to disclose and discuss suicidal ideas Ability to demonstrate self-control will improve Ability to identify and develop effective coping behaviors will improve Ability to maintain clinical measurements within normal limits will improve Compliance with prescribed medications will improve Ability to identify triggers associated with substance abuse/mental health issues will improve     Medication Management: Evaluate patient's response, side effects, and tolerance of medication regimen.  Therapeutic Interventions: 1 to 1 sessions, Unit Group sessions and Medication administration.  Evaluation of  Outcomes: Not Met   RN Treatment Plan for Primary Diagnosis: <principal problem not specified> Long Term Goal(s): Knowledge of disease and therapeutic regimen to maintain health will improve  Short Term Goals: Ability to disclose and discuss suicidal ideas, Ability to identify and develop effective coping behaviors will improve and Compliance with prescribed medications will improve  Medication Management: RN will administer medications as ordered by provider, will assess and evaluate patient's response and provide education to patient for prescribed medication. RN will report any adverse and/or side effects to prescribing provider.  Therapeutic Interventions: 1 on 1 counseling sessions, Psychoeducation, Medication administration, Evaluate responses to treatment, Monitor vital signs and CBGs as ordered, Perform/monitor CIWA, COWS, AIMS and Fall Risk screenings as ordered, Perform wound care treatments as ordered.  Evaluation of Outcomes: Not Met   LCSW Treatment Plan for Primary Diagnosis: <principal problem not specified> Long Term Goal(s): Safe transition to appropriate next level of care at discharge, Engage patient in therapeutic group addressing interpersonal concerns.  Short Term Goals: Engage patient in aftercare planning with referrals and resources  Therapeutic Interventions: Assess for all discharge needs, 1 to 1 time with Social worker, Explore available resources and support systems, Assess for adequacy in community support network, Educate family and significant other(s) on suicide prevention, Complete Psychosocial Assessment, Interpersonal group therapy.  Evaluation of Outcomes: Not Met   Progress in Treatment: Attending groups: Yes. Participating in groups: Yes. Taking medication as prescribed: Yes. Toleration medication: Yes. Family/Significant other contact made: No, will contact:  the patient's wife Patient understands diagnosis: Yes. Discussing patient identified  problems/goals with staff: Yes. Medical problems stabilized or resolved: Yes. Denies suicidal/homicidal ideation: Yes. Issues/concerns per patient self-inventory: No. Other:  New problem(s) identified: None  New Short Term/Long Term Goal(s):Detox, medication stabilization, elimination of SI thoughts, development of comprehensive mental wellness plan.   Patient Goals: "help with my suicidal thoughts and my drinking"  Discharge Plan or Barriers: CSW will continue to assess for appropriate referrals and discharge planning.   Reason for Continuation of Hospitalization: Depression Medication stabilization Suicidal ideation  Estimated Length of Stay:3-5 days   Attendees: Patient: Scott [name] 03/08/2018 11:27 AM  Physician: Dr. Myles Lipps, MD 03/08/2018 11:27 AM  Nursing: Darrol Angel, RN 03/08/2018 11:27 AM  RN Care Manager:X 03/08/2018 11:27 AM  Social Worker: Radonna Ricker, [place] 03/08/2018 11:27 AM  Recreational Therapist: X 03/08/2018 11:27 AM  Other: X 03/08/2018 11:27 AM  Other: X 03/08/2018 11:27 AM  Other:X 03/08/2018 11:27 AM    Scribe for Treatment Team: Marylee Floras, [place] 03/08/2018 11:27 AM

## 2018-03-08 NOTE — Plan of Care (Signed)
  Problem: Safety: Goal: Periods of time without injury will increase Outcome: Progressing-pt denies SI/HI. No self injurious behaviors noted.    Problem: Activity: Goal: Interest or engagement in leisure activities will improve Outcome: Progressing-pt observed attending scheduled groups throughout the day.   Problem: Self-Concept: Goal: Ability to disclose and discuss suicidal ideas will improve Outcome: Progressing-pt denies SI. Pt expressed being able to discuss having SI with writer if any thoughts occur.

## 2018-03-08 NOTE — Therapy (Signed)
Occupational Therapy Group Note  Date:  03/08/2018 Time:  3:03 PM  Group Topic/Focus:  Stress Management  Participation Level:  Minimal  Participation Quality:  Drowsy  Affect:  Flat  Cognitive:  Appropriate  Insight: Improving  Engagement in Group:  Limited  Modes of Intervention:  Activity, Discussion, Education and Socialization  Additional Comments:    S: "When you're depressed you feel nothing so mental imagery is helpful"  O: Stress management group completed to use as productive coping strategy, to help mitigate maladaptive coping to integrate in functional BADL/IADL when reintegrating into community. Education given on the definition of stress and its cognitive, behavioral, emotional, and physical effects on the body. Stress management tools worksheet completed to identify negative coping mechanisms and their short and long term effects vs positive coping mechanisms with demonstration. Coping strategies taught include: relaxation based- deep breathing, counting to 10, taking a 1 minute vacation, acceptance, stress balls, relaxation audio/video, visual/mental imagery. Positive mental attitude- gratitude, acceptance, cognitive reframing, positive self talk, anger management.  Gratitude journaling handout and instruction also given. Adult coloring and relaxation tips worksheet given at end of session.   A: Pt presents to group with flat affect, drowsy. Pt eyes fluttering closed while talking to facilitator. Stress management tools completed. Pt states he finds visual and mental imagery helpful because when he is depressed he feels nothing this date. Eager to accept handouts at end of session.  P:?Pt provided with education on stress management activities to implement into daily routine. Handouts given to facilitate carryover when reintegrating into community  ?     Dalphine HandingKaylee Malesha Suliman, MSOT, OTR/L  AllenKaylee Enrique Manganaro 03/08/2018, 3:03 PM

## 2018-03-08 NOTE — Plan of Care (Addendum)
  Problem: Activity: Goal: Interest or engagement in activities will improve Outcome: Progressing-Scott [name] has been attending groups.   Problem: Activity: Goal: Sleeping patterns will improve Outcome: Progressing-he reported that he has aving difficulty sleeping the past few nights.   Problem: Health Behavior/Discharge Planning: Goal: Compliance with treatment plan for underlying cause of condition will improve Outcome: Progressing-he takes his medications as prescribed and will ask for prn's if needed.

## 2018-03-08 NOTE — Progress Notes (Signed)
[facility] MD Progress Note  03/08/2018 12:22 PM Scott [name]  MRN:  [id number] Subjective: Patient is seen and examined.  Patient is a 47 year old male with a past psychiatric history significant for major depressive disorder as well as alcohol use disorder.  He is seen in follow-up.  He said he feels better today.  He stated he is working on trying to get social work to find him a place to stay for a long-term substance abuse treatment program.  He stated that he felt like he needs to be in a residential program to be able to get things done.  He stated he is not having any alcohol withdrawal symptoms, and felt as though we could stop the lorazepam.  He does complain of acid reflux symptoms, and his LFTs were elevated on admission.  I have written to repeat his liver function enzymes in the a.m.  He denied any suicidal ideation today. Principal Problem: <principal problem not specified> Diagnosis:   Patient Active Problem List   Diagnosis Date Noted  . MDD (major depressive disorder), recurrent severe, without psychosis (HCC) [F33.2] 03/06/2018  . MDD (major depressive disorder), severe (HCC) [F32.2] 12/16/2017  . Schizoaffective disorder (HCC) [F25.9] 06/07/2017  . Alcohol use disorder, severe, dependence (HCC) [F10.20] 07/24/2015  . Alcohol withdrawal (HCC) [F10.239] 07/24/2015  . Cocaine use disorder, mild, abuse (HCC) [F14.10] 07/24/2015  . Substance induced mood disorder (HCC) [F19.94] 07/22/2015  . Drug-seeking behavior [Z76.5] 02/16/2015  . PTSD (post-traumatic stress disorder) [F43.10] 12/26/2014  . Opioid type dependence, continuous (HCC) [F11.20] 12/26/2014  . Major depressive disorder, recurrent episode, moderate (HCC) [F33.1]   . Chest pain at rest [R07.9] 10/22/2013  . Chronic pain syndrome [G89.4] 10/22/2013  . Seizure disorder (HCC) [G40.909] 10/22/2013  . Chest pain [R07.9] 10/21/2013   Total Time spent with patient: 15 minutes  Past Psychiatric History: See admission  H&P  Past Medical History:  Past Medical History:  Diagnosis Date  . Accidental heroin overdose (HCC)   . Anxiety   . Back pain   . Bipolar 1 disorder (HCC)   . Chronic back pain   . Depression   . Drug-seeking behavior   . GERD (gastroesophageal reflux disease)   . Heroin abuse (HCC)   . History of ETOH abuse   . Myocardial infarction [facility])    Pt says that the doctor determined it was panic attacks  . Opioid dependence (HCC)   . PTSD (post-traumatic stress disorder)   . Seizures (HCC)    alcohol induced- pt reports sober for 6 months    Past Surgical History:  Procedure Laterality Date  . ABDOMINAL SURGERY    . APPENDECTOMY    . BACK SURGERY     Family History:  Family History  Problem Relation Age of Onset  . Hypertension Mother   . CAD Father   . COPD Father   . Stroke Father   . Testicular cancer Brother    Family Psychiatric  History: See admission H&P Social History:  Social History   Substance and Sexual Activity  Alcohol Use Not Currently   Comment: Pt reports sober for 6 months     Social History   Substance and Sexual Activity  Drug Use Yes  . Types: Cocaine, Methamphetamines, Marijuana, IV   Comment: Pt says not sure why positive for cocaine    Social History   Socioeconomic History  . Marital status: Married    Spouse name: Not on file  . Number of children: Not on  file  . Years of education: Not on file  . Highest education level: Not on file  Occupational History  . Not on file  Social Needs  . Financial resource strain: Not on file  . Food insecurity:    Worry: Not on file    Inability: Not on file  . Transportation needs:    Medical: Not on file    Non-medical: Not on file  Tobacco Use  . Smoking status: Current Every Day Smoker    Packs/day: 1.50    Types: Cigarettes  . Smokeless tobacco: Never Used  Substance and Sexual Activity  . Alcohol use: Not Currently    Comment: Pt reports sober for 6 months  . Drug use: Yes     Types: Cocaine, Methamphetamines, Marijuana, IV    Comment: Pt says not sure why positive for cocaine  . Sexual activity: Yes    Birth control/protection: None    Comment: heroin  Lifestyle  . Physical activity:    Days per week: Not on file    Minutes per session: Not on file  . Stress: Not on file  Relationships  . Social connections:    Talks on phone: Not on file    Gets together: Not on file    Attends religious service: Not on file    Active member of club or organization: Not on file    Attends meetings of clubs or organizations: Not on file    Relationship status: Not on file  Other Topics Concern  . Not on file  Social History Narrative  . Not on file   Additional Social History:                         Sleep: Fair  Appetite:  Good  Current Medications: Current Facility-Administered Medications  Medication Dose Route Frequency Provider Last Rate Last Dose  . acetaminophen (TYLENOL) tablet 650 mg  650 mg Oral Q6H PRN Fransisca Kaufmann A, NP   650 mg at 03/07/18 1327  . busPIRone (BUSPAR) tablet 10 mg  10 mg Oral BID Antonieta Pert, MD   10 mg at 03/08/18 0831  . hydrOXYzine (ATARAX/VISTARIL) tablet 25 mg  25 mg Oral Q6H PRN Fransisca Kaufmann A, NP      . loperamide (IMODIUM) capsule 2-4 mg  2-4 mg Oral PRN Nira Conn A, NP      . LORazepam (ATIVAN) tablet 1 mg  1 mg Oral TID Nira Conn A, NP   1 mg at 03/08/18 1148   Followed by  . [START ON 03/09/2018] LORazepam (ATIVAN) tablet 1 mg  1 mg Oral BID Jackelyn Poling, NP       Followed by  . [START ON 03/11/2018] LORazepam (ATIVAN) tablet 1 mg  1 mg Oral Daily Nira Conn A, NP      . magnesium hydroxide (MILK OF MAGNESIA) suspension 30 mL  30 mL Oral Daily PRN Thermon Leyland, NP      . methadone (DOLOPHINE) tablet 75 mg  75 mg Oral Daily Nira Conn A, NP   75 mg at 03/08/18 0819  . methocarbamol (ROBAXIN) tablet 500 mg  500 mg Oral Q6H PRN Antonieta Pert, MD      . multivitamin with minerals tablet 1  tablet  1 tablet Oral Daily Nira Conn A, NP   1 tablet at 03/08/18 0819  . nicotine (NICODERM CQ - dosed in mg/24 hours) patch 21 mg  21 mg Transdermal  Daily Nira Conn A, NP   21 mg at 03/08/18 4098  . ondansetron (ZOFRAN-ODT) disintegrating tablet 4 mg  4 mg Oral Q6H PRN Nira Conn A, NP      . pantoprazole (PROTONIX) EC tablet 40 mg  40 mg Oral Daily Nira Conn A, NP   40 mg at 03/08/18 0820  . pantoprazole (PROTONIX) EC tablet 40 mg  40 mg Oral Daily Antonieta Pert, MD      . prazosin (MINIPRESS) capsule 2 mg  2 mg Oral QHS Nira Conn A, NP   2 mg at 03/07/18 2132  . QUEtiapine (SEROQUEL) tablet 100 mg  100 mg Oral QHS Nira Conn A, NP   100 mg at 03/07/18 2133  . sertraline (ZOLOFT) tablet 25 mg  25 mg Oral Daily Antonieta Pert, MD   25 mg at 03/08/18 0820  . thiamine (VITAMIN B-1) tablet 100 mg  100 mg Oral Daily Nira Conn A, NP   100 mg at 03/08/18 1191    Lab Results: No results found for this or any previous visit (from the past 48 hour(s)).  Blood Alcohol level:  Lab Results  Component Value Date   ETH <10 03/05/2018   ETH <10 12/16/2017    Metabolic Disorder Labs: No results found for: HGBA1C, MPG No results found for: PROLACTIN No results found for: CHOL, TRIG, HDL, CHOLHDL, VLDL, LDLCALC  Physical Findings: AIMS: Facial and Oral Movements Muscles of Facial Expression: None, normal Lips and Perioral Area: None, normal Jaw: None, normal Tongue: None, normal,Extremity Movements Upper (arms, wrists, hands, fingers): None, normal Lower (legs, knees, ankles, toes): None, normal, Trunk Movements Neck, shoulders, hips: None, normal, Overall Severity Severity of abnormal movements (highest score from questions above): None, normal Incapacitation due to abnormal movements: None, normal Patient's awareness of abnormal movements (rate only patient's report): No Awareness, Dental Status Current problems with teeth and/or dentures?: No Does patient usually  wear dentures?: No  CIWA:  CIWA-Ar Total: 3 COWS:     Musculoskeletal: Strength & Muscle Tone: within normal limits Gait & Station: normal Patient leans: N/A  Psychiatric Specialty Exam: Physical Exam  Nursing note and vitals reviewed. Constitutional: He is oriented to person, place, and time. He appears well-developed and well-nourished.  HENT:  Head: Normocephalic and atraumatic.  Respiratory: Effort normal.  Neurological: He is alert and oriented to person, place, and time.    ROS  Blood pressure 126/81, pulse 65, temperature 98.1 F (36.7 C), temperature source Oral, resp. rate 20, height 6' (1.829 m), weight 120.2 kg (265 lb), SpO2 97 %.Body mass index is 35.94 kg/m.  General Appearance: Casual  Eye Contact:  Fair  Speech:  Normal Rate  Volume:  Normal  Mood:  Dysphoric  Affect:  Congruent  Thought Process:  Coherent  Orientation:  Full (Time, Place, and Person)  Thought Content:  Logical  Suicidal Thoughts:  Yes.  without intent/plan  Homicidal Thoughts:  No  Memory:  Immediate;   Fair Recent;   Fair Remote;   Fair  Judgement:  Fair  Insight:  Fair  Psychomotor Activity:  Normal  Concentration:  Concentration: Fair and Attention Span: Fair  Recall:  [employer] of Knowledge:  Fair  Language:  Good  Akathisia:  Negative  Handed:  Right  AIMS (if indicated):     Assets:  Communication Skills Desire for Improvement Housing Resilience Talents/Skills  ADL's:  Intact  Cognition:  WNL  Sleep:  Number of Hours: 4.5  Treatment Plan Summary: Daily contact with patient to assess and evaluate symptoms and progress in treatment, Medication management and Plan : Patient is seen and examined.  Patient is a 47 year old male with the above-stated past psychiatric history seen in follow-up.  He is requested to stop the lorazepam.  He does not believe that he is having any withdrawal symptoms.  He continues to complain of reflux symptoms, and he was started on Protonix  yesterday.  We will go on and giving Pepcid 20 mg p.o. twice daily in the short run until the Protonix kicks in.  For psychiatric issues he continues on Zoloft 25 mg p.o. daily.  He is also on prazosin for nightmares, and Seroquel for sleep.  He is requesting Robaxin for pain, and will start 500 mg every 6 hours as needed muscle spasm pain.  Otherwise no change in his current medications.  Social work continues to work with his request for substance abuse residential program.  Antonieta PertGreg Lawson Ambra Haverstick, MD 03/08/2018, 12:22 PM

## 2018-03-08 NOTE — Progress Notes (Signed)
Recreation Therapy Notes  Date: 7.31.19 Time: 0930 Location: [address] Dayroom  Group Topic: Stress Management  Goal Area(s) Addresses:  Patient will verbalize importance of using healthy stress management.  Patient will identify positive emotions associated with healthy stress management.   Intervention: Stress Management  Activity :  Guided Imagery.  LRT introduced the stress management technique of guided imagery.  LRT read a script about peaceful waves.  Patients were to follow along as LRT read script to relax and envision being on the beach.  Education:  Stress Management, Discharge Planning.   Education Outcome: Acknowledges edcuation/In group clarification offered/Needs additional education  Clinical Observations/Feedback:  Pt did not attend group.      Shirla Hodgkiss, LRT/CTRS         Tracia Lacomb A 03/08/2018 10:45 AM 

## 2018-03-08 NOTE — Progress Notes (Signed)
D:  Scott [name] has been up and visible on the unit.  He attended evening wrap up group.  He was noted later dozing off while trying to eat his snack.  He reported that he is really tired today because "I didn't sleep well last night."  He requested his hs medication and went to bed.  No evidence of withdrawal symptoms noted.  VSS.  He denied any pain or discomfort and appeared to be in no physical distress.  A:  1:1 with RN for support and encouragement.  Medications as ordered.  Q 15 minute checks maintained for safety.  Encouraged participation in group and unit activities.   R:  Leith remains safe on the unit.  We will continue to monitor the progress towards his goals.

## 2018-03-09 LAB — HEPATIC FUNCTION PANEL
ALT: 242 U/L — ABNORMAL HIGH (ref 0–44)
AST: 228 U/L — ABNORMAL HIGH (ref 15–41)
Albumin: 3.4 g/dL — ABNORMAL LOW (ref 3.5–5.0)
Alkaline Phosphatase: 124 U/L (ref 38–126)
Bilirubin, Direct: 0.2 mg/dL (ref 0.0–0.2)
Indirect Bilirubin: 0.4 mg/dL (ref 0.3–0.9)
Total Bilirubin: 0.6 mg/dL (ref 0.3–1.2)
Total Protein: 6.7 g/dL (ref 6.5–8.1)

## 2018-03-09 MED ORDER — PRAZOSIN HCL 2 MG PO CAPS
4.0000 mg | ORAL_CAPSULE | Freq: Every day | ORAL | Status: DC
  Administered 2018-03-09: 4 mg via ORAL
  Filled 2018-03-09 (×2): qty 2

## 2018-03-09 MED ORDER — SERTRALINE HCL 50 MG PO TABS
50.0000 mg | ORAL_TABLET | Freq: Every day | ORAL | Status: DC
  Administered 2018-03-10: 50 mg via ORAL
  Filled 2018-03-09 (×3): qty 1

## 2018-03-09 NOTE — Progress Notes (Signed)
BH MD Progress Note  03/09/2018 12:16 PM HALO LASKI  MRN:  [id number] Subjective: Patient is seen and examined.  Patient is a 47 year old male with a past psychiatric history significant for major depression, alcohol use disorder and probable posttraumatic stress disorder.  He is seen in follow-up.  He looks tired today.  He stated he did not sleep well last night.  He had increased nightmares and flashbacks about his previous trauma.  He stated that he had some weird sensation around his rectum this morning when he woke up, he is unsure whether that secondary to his nightmares or whether it is sleep he manipulated his rectum.  We discussed somatization issues with posttraumatic stress disorder.  He continues to seek some form of a residential program because of transportation issues with regard to being able to get to treatment at home.  He feels like he wants to take his disability check and pay for a one bedroom type of place so he can be away from his wife until he stabilizes things.  His liver function enzymes have unfortunately increased during the course of the hospitalization.  4 days ago his AST was 144, and is now 228.  Additionally his ALT was 191, and is now 242.  He denied any suicidality today. Principal Problem: <principal problem not specified> Diagnosis:   Patient Active Problem List   Diagnosis Date Noted  . MDD (major depressive disorder), recurrent severe, without psychosis (HCC) [F33.2] 03/06/2018  . MDD (major depressive disorder), severe (HCC) [F32.2] 12/16/2017  . Schizoaffective disorder (HCC) [F25.9] 06/07/2017  . Alcohol use disorder, severe, dependence (HCC) [F10.20] 07/24/2015  . Alcohol withdrawal (HCC) [F10.239] 07/24/2015  . Cocaine use disorder, mild, abuse (HCC) [F14.10] 07/24/2015  . Substance induced mood disorder (HCC) [F19.94] 07/22/2015  . Drug-seeking behavior [Z76.5] 02/16/2015  . PTSD (post-traumatic stress disorder) [F43.10] 12/26/2014  . Opioid type  dependence, continuous (HCC) [F11.20] 12/26/2014  . Major depressive disorder, recurrent episode, moderate (HCC) [F33.1]   . Chest pain at rest [R07.9] 10/22/2013  . Chronic pain syndrome [G89.4] 10/22/2013  . Seizure disorder (HCC) [G40.909] 10/22/2013  . Chest pain [R07.9] 10/21/2013   Total Time spent with patient: 15 minutes  Past Psychiatric History: See admission H&P  Past Medical History:  Past Medical History:  Diagnosis Date  . Accidental heroin overdose (HCC)   . Anxiety   . Back pain   . Bipolar 1 disorder (HCC)   . Chronic back pain   . Depression   . Drug-seeking behavior   . GERD (gastroesophageal reflux disease)   . Heroin abuse (HCC)   . History of ETOH abuse   . Myocardial infarction [facility])    Pt says that the doctor determined it was panic attacks  . Opioid dependence (HCC)   . PTSD (post-traumatic stress disorder)   . Seizures (HCC)    alcohol induced- pt reports sober for 6 months    Past Surgical History:  Procedure Laterality Date  . ABDOMINAL SURGERY    . APPENDECTOMY    . BACK SURGERY     Family History:  Family History  Problem Relation Age of Onset  . Hypertension Mother   . CAD Father   . COPD Father   . Stroke Father   . Testicular cancer Brother    Family Psychiatric  History: See admission H&P Social History:  Social History   Substance and Sexual Activity  Alcohol Use Not Currently   Comment: Pt reports sober for 6 months  Social History   Substance and Sexual Activity  Drug Use Yes  . Types: Cocaine, Methamphetamines, Marijuana, IV   Comment: Pt says not sure why positive for cocaine    Social History   Socioeconomic History  . Marital status: Married    Spouse name: Not on file  . Number of children: Not on file  . Years of education: Not on file  . Highest education level: Not on file  Occupational History  . Not on file  Social Needs  . Financial resource strain: Not on file  . Food insecurity:    Worry:  Not on file    Inability: Not on file  . Transportation needs:    Medical: Not on file    Non-medical: Not on file  Tobacco Use  . Smoking status: Current Every Day Smoker    Packs/day: 1.50    Types: Cigarettes  . Smokeless tobacco: Never Used  Substance and Sexual Activity  . Alcohol use: Not Currently    Comment: Pt reports sober for 6 months  . Drug use: Yes    Types: Cocaine, Methamphetamines, Marijuana, IV    Comment: Pt says not sure why positive for cocaine  . Sexual activity: Yes    Birth control/protection: None    Comment: heroin  Lifestyle  . Physical activity:    Days per week: Not on file    Minutes per session: Not on file  . Stress: Not on file  Relationships  . Social connections:    Talks on phone: Not on file    Gets together: Not on file    Attends religious service: Not on file    Active member of club or organization: Not on file    Attends meetings of clubs or organizations: Not on file    Relationship status: Not on file  Other Topics Concern  . Not on file  Social History Narrative  . Not on file   Additional Social History:                         Sleep: Poor  Appetite:  Fair  Current Medications: Current Facility-Administered Medications  Medication Dose Route Frequency Provider Last Rate Last Dose  . acetaminophen (TYLENOL) tablet 650 mg  650 mg Oral Q6H PRN Fransisca Kaufmann A, NP   650 mg at 03/07/18 1327  . busPIRone (BUSPAR) tablet 10 mg  10 mg Oral BID Antonieta Pert, MD   10 mg at 03/09/18 0814  . famotidine (PEPCID) tablet 20 mg  20 mg Oral BID Antonieta Pert, MD   20 mg at 03/09/18 0815  . hydrOXYzine (ATARAX/VISTARIL) tablet 25 mg  25 mg Oral Q6H PRN Fransisca Kaufmann A, NP      . loperamide (IMODIUM) capsule 2-4 mg  2-4 mg Oral PRN Nira Conn A, NP      . magnesium hydroxide (MILK OF MAGNESIA) suspension 30 mL  30 mL Oral Daily PRN Thermon Leyland, NP      . methadone (DOLOPHINE) tablet 75 mg  75 mg Oral Daily Nira Conn A, NP   75 mg at 03/09/18 1610  . methocarbamol (ROBAXIN) tablet 500 mg  500 mg Oral Q6H PRN Antonieta Pert, MD   500 mg at 03/09/18 1025  . multivitamin with minerals tablet 1 tablet  1 tablet Oral Daily Nira Conn A, NP   1 tablet at 03/09/18 0815  . nicotine (NICODERM CQ - dosed in mg/24  hours) patch 21 mg  21 mg Transdermal Daily Nira ConnBerry, Jason A, NP   21 mg at 03/09/18 0816  . ondansetron (ZOFRAN-ODT) disintegrating tablet 4 mg  4 mg Oral Q6H PRN Nira ConnBerry, Jason A, NP      . pantoprazole (PROTONIX) EC tablet 40 mg  40 mg Oral Daily Antonieta Pertlary, Emalyn Schou Lawson, MD   40 mg at 03/09/18 0816  . prazosin (MINIPRESS) capsule 4 mg  4 mg Oral QHS Antonieta Pertlary, Kiree Dejarnette Lawson, MD      . QUEtiapine (SEROQUEL) tablet 100 mg  100 mg Oral QHS Nira ConnBerry, Jason A, NP   100 mg at 03/08/18 2124  . [START ON 03/10/2018] sertraline (ZOLOFT) tablet 50 mg  50 mg Oral Daily Antonieta Pertlary, Amarea Macdowell Lawson, MD      . thiamine (VITAMIN B-1) tablet 100 mg  100 mg Oral Daily Nira ConnBerry, Jason A, NP   100 mg at 03/09/18 16100816    Lab Results:  Results for orders placed or performed during the hospital encounter of 03/06/18 (from the past 48 hour(s))  Hepatic function panel     Status: Abnormal   Collection Time: 03/09/18  6:36 AM  Result Value Ref Range   Total Protein 6.7 6.5 - 8.1 g/dL   Albumin 3.4 (L) 3.5 - 5.0 g/dL   AST 960228 (H) 15 - 41 U/L   ALT 242 (H) 0 - 44 U/L   Alkaline Phosphatase 124 38 - 126 U/L   Total Bilirubin 0.6 0.3 - 1.2 mg/dL   Bilirubin, Direct 0.2 0.0 - 0.2 mg/dL   Indirect Bilirubin 0.4 0.3 - 0.9 mg/dL    Comment: Performed at [facility], [address] [place], [place] [id number]    Blood Alcohol level:  Lab Results  Component Value Date   ETH <10 03/05/2018   ETH <10 12/16/2017    Metabolic Disorder Labs: No results found for: HGBA1C, MPG No results found for: PROLACTIN No results found for: CHOL, TRIG, HDL, CHOLHDL, VLDL, LDLCALC  Physical Findings: AIMS: Facial and Oral  Movements Muscles of Facial Expression: None, normal Lips and Perioral Area: None, normal Jaw: None, normal Tongue: None, normal,Extremity Movements Upper (arms, wrists, hands, fingers): None, normal Lower (legs, knees, ankles, toes): None, normal, Trunk Movements Neck, shoulders, hips: None, normal, Overall Severity Severity of abnormal movements (highest score from questions above): None, normal Incapacitation due to abnormal movements: None, normal Patient's awareness of abnormal movements (rate only patient's report): No Awareness, Dental Status Current problems with teeth and/or dentures?: No Does patient usually wear dentures?: No  CIWA:  CIWA-Ar Total: 1 COWS:     Musculoskeletal: Strength & Muscle Tone: within normal limits Gait & Station: normal Patient leans: N/A  Psychiatric Specialty Exam: Physical Exam  Nursing note and vitals reviewed. Constitutional: He is oriented to person, place, and time. He appears well-developed and well-nourished.  HENT:  Head: Normocephalic and atraumatic.  Respiratory: Effort normal.  Neurological: He is alert and oriented to person, place, and time.    ROS  Blood pressure 104/86, pulse 92, temperature 97.8 F (36.6 C), temperature source Oral, resp. rate 20, height 6' (1.829 m), weight 120.2 kg (265 lb), SpO2 97 %.Body mass index is 35.94 kg/m.  General Appearance: Disheveled  Eye Contact:  Fair  Speech:  Normal Rate  Volume:  Normal  Mood:  Dysphoric  Affect:  Congruent  Thought Process:  Coherent  Orientation:  Full (Time, Place, and Person)  Thought Content:  Logical  Suicidal Thoughts:  Yes.  without  intent/plan  Homicidal Thoughts:  No  Memory:  Immediate;   Fair Recent;   Fair Remote;   Fair  Judgement:  Intact  Insight:  Fair  Psychomotor Activity:  Psychomotor Retardation  Concentration:  Concentration: Fair and Attention Span: Fair  Recall:  [employer] of Knowledge:  Fair  Language:  Fair  Akathisia:  Negative   Handed:  Right  AIMS (if indicated):     Assets:  Communication Skills Desire for Improvement Housing Leisure Time Resilience Social Support  ADL's:  Intact  Cognition:  WNL  Sleep:  Number of Hours: 5.75     Treatment Plan Summary: Daily contact with patient to assess and evaluate symptoms and progress in treatment, Medication management and Plan : Patient is seen and examined.  Patient is a 47 year old male with the above-stated past psychiatric history seen in follow-up.  I am going to increase his prazosin to 4 mg p.o. nightly to try and suppress his nightmares.  If this does not work I we will change medications for this.  Additionally I am going to increase his Zoloft to 50 mg p.o. daily.  We will continue the Seroquel to 100 mg p.o. nightly for now.  No change in his BuSpar for anxiety.  No change in the Ativan for now.  He continues on methadone and Robaxin for pain.  His liver function enzymes have gone up today, and it may just be because the fact that his liver is waking back up, but I am concerned for underlying liver illness.  We will schedule a repeat of liver function enzymes on Saturday.  Additionally I will go on and order for that point and ammonia, PT, PTT and INR to check synthetic function.  Antonieta Pert, MD 03/09/2018, 12:16 PM

## 2018-03-09 NOTE — [facility] Group Notes (Signed)
Adult Psychoeducational Group Note  Date:  03/09/2018  Time:1600  Group Topic/Focus: Meditation and Deep Breathing Group Leader: Jonathan N, RN  Participation Level:  Active  Participation Quality:  Appropriate and Attentive  Affect:  Appropriate  Cognitive:  Alert and Oriented  Insight: Developing/Improving  Engagement in Group:  Developing/Improving  Modes of Intervention: Activity, Education  Additional Comments:  Patient attended group and was appropriate throughout the activity.  Aracelys Glade A Keisi Eckford, RN 03/09/2018, 1700  

## 2018-03-09 NOTE — Progress Notes (Signed)
Pt was observed in the dayroom, seen eating a snack. Pt attended wrap-up group and participated. Pt appears anxious/depressed in affect and mood. Pt denies SI/HI/AVH/Pain at this time. Pt states he had a rough day because it was long.Minipress was increase this evening. Will re-eval Pt's response to medication in the a.m. Encourage Pt to push fluids. PRN robaxin requested and given. Will continue with POC.

## 2018-03-10 LAB — PROTIME-INR
INR: 0.99
Prothrombin Time: 12.9 seconds (ref 11.4–15.2)

## 2018-03-10 MED ORDER — SERTRALINE HCL 50 MG PO TABS
50.0000 mg | ORAL_TABLET | Freq: Once | ORAL | Status: AC
  Administered 2018-03-10: 50 mg via ORAL
  Filled 2018-03-10: qty 1

## 2018-03-10 MED ORDER — QUETIAPINE FUMARATE 200 MG PO TABS
200.0000 mg | ORAL_TABLET | Freq: Every day | ORAL | Status: DC
  Administered 2018-03-10: 200 mg via ORAL
  Filled 2018-03-10 (×3): qty 1

## 2018-03-10 MED ORDER — BUSPIRONE HCL 15 MG PO TABS
15.0000 mg | ORAL_TABLET | Freq: Three times a day (TID) | ORAL | Status: DC
  Administered 2018-03-10 – 2018-03-13 (×10): 15 mg via ORAL
  Filled 2018-03-10 (×16): qty 1

## 2018-03-10 MED ORDER — TOPIRAMATE 25 MG PO TABS
50.0000 mg | ORAL_TABLET | Freq: Every day | ORAL | Status: DC
  Administered 2018-03-10: 50 mg via ORAL
  Filled 2018-03-10 (×3): qty 2

## 2018-03-10 MED ORDER — SERTRALINE HCL 100 MG PO TABS
100.0000 mg | ORAL_TABLET | Freq: Every day | ORAL | Status: DC
  Administered 2018-03-11 – 2018-03-17 (×7): 100 mg via ORAL
  Filled 2018-03-10 (×9): qty 1

## 2018-03-10 MED ORDER — ALBUTEROL SULFATE HFA 108 (90 BASE) MCG/ACT IN AERS
2.0000 | INHALATION_SPRAY | Freq: Four times a day (QID) | RESPIRATORY_TRACT | Status: DC | PRN
  Administered 2018-03-10 – 2018-03-16 (×5): 2 via RESPIRATORY_TRACT
  Filled 2018-03-10: qty 6.7

## 2018-03-10 NOTE — [facility] Group Notes (Signed)
Adult Psychoeducational Group Note   Date:  03/10/2018  Time: 4:00 PM   Group Topic/Focus: Music as a Coping Skill Patients choose a song of significance to and explain to the group how the song has helped with their recovery.   Participation Level:  Did Not Attend   Additional Comments:  Patient was invited but declined to attend group.   Marchelle Folksmanda A Ameliarose Shark 03/10/2018 5:00 PM

## 2018-03-10 NOTE — Progress Notes (Signed)
[facility] MD Progress Note  03/10/2018 12:46 PM Scott [name]  MRN:  [id number] Subjective: Patient is seen and examined.  Patient is a 47 year old male with a past psychiatric history significant for major depression, alcohol use disorder/dependence and probable posttraumatic stress disorder.  He seen in follow-up.  He continues to have difficulty sleeping.  The prazosin is not doing anything for his nightmares.  Overall he is unchanged from yesterday.  Unfortunately his liver function enzymes have increased during the course of hospitalization.  We discussed that today.  He stated that previously when he was in an alcohol withdrawal rehab facility he had been given Seroquel, and that led to increased liver function enzymes.  We discussed the fact that if his enzymes continue to go up we may have to stop that.  Additionally he said he did have a history of hepatitis C.  The hepatitis panel and repeat liver function enzymes as well as ammonia are still pending.  We discussed stopping the prazosin, and going with some other medication for his nightmares and flashbacks.  We also discussed increasing the BuSpar for anxiety symptoms.  Otherwise he remains on Seroquel 100 mg p.o. nightly and sertraline 50 mg p.o. daily. Principal Problem: <principal problem not specified> Diagnosis:   Patient Active Problem List   Diagnosis Date Noted  . MDD (major depressive disorder), recurrent severe, without psychosis (HCC) [F33.2] 03/06/2018  . MDD (major depressive disorder), severe (HCC) [F32.2] 12/16/2017  . Schizoaffective disorder (HCC) [F25.9] 06/07/2017  . Alcohol use disorder, severe, dependence (HCC) [F10.20] 07/24/2015  . Alcohol withdrawal (HCC) [F10.239] 07/24/2015  . Cocaine use disorder, mild, abuse (HCC) [F14.10] 07/24/2015  . Substance induced mood disorder (HCC) [F19.94] 07/22/2015  . Drug-seeking behavior [Z76.5] 02/16/2015  . PTSD (post-traumatic stress disorder) [F43.10] 12/26/2014  . Opioid type  dependence, continuous (HCC) [F11.20] 12/26/2014  . Major depressive disorder, recurrent episode, moderate (HCC) [F33.1]   . Chest pain at rest [R07.9] 10/22/2013  . Chronic pain syndrome [G89.4] 10/22/2013  . Seizure disorder (HCC) [G40.909] 10/22/2013  . Chest pain [R07.9] 10/21/2013   Total Time spent with patient: 15 minutes  Past Psychiatric History: See admission H&P  Past Medical History:  Past Medical History:  Diagnosis Date  . Accidental heroin overdose (HCC)   . Anxiety   . Back pain   . Bipolar 1 disorder (HCC)   . Chronic back pain   . Depression   . Drug-seeking behavior   . GERD (gastroesophageal reflux disease)   . Heroin abuse (HCC)   . History of ETOH abuse   . Myocardial infarction [facility])    Pt says that the doctor determined it was panic attacks  . Opioid dependence (HCC)   . PTSD (post-traumatic stress disorder)   . Seizures (HCC)    alcohol induced- pt reports sober for 6 months    Past Surgical History:  Procedure Laterality Date  . ABDOMINAL SURGERY    . APPENDECTOMY    . BACK SURGERY     Family History:  Family History  Problem Relation Age of Onset  . Hypertension Mother   . CAD Father   . COPD Father   . Stroke Father   . Testicular cancer Brother    Family Psychiatric  History: See admission H&P Social History:  Social History   Substance and Sexual Activity  Alcohol Use Not Currently   Comment: Pt reports sober for 6 months     Social History   Substance and Sexual Activity  Drug  Use Yes  . Types: Cocaine, Methamphetamines, Marijuana, IV   Comment: Pt says not sure why positive for cocaine    Social History   Socioeconomic History  . Marital status: Married    Spouse name: Not on file  . Number of children: Not on file  . Years of education: Not on file  . Highest education level: Not on file  Occupational History  . Not on file  Social Needs  . Financial resource strain: Not on file  . Food insecurity:    Worry:  Not on file    Inability: Not on file  . Transportation needs:    Medical: Not on file    Non-medical: Not on file  Tobacco Use  . Smoking status: Current Every Day Smoker    Packs/day: 1.50    Types: Cigarettes  . Smokeless tobacco: Never Used  Substance and Sexual Activity  . Alcohol use: Not Currently    Comment: Pt reports sober for 6 months  . Drug use: Yes    Types: Cocaine, Methamphetamines, Marijuana, IV    Comment: Pt says not sure why positive for cocaine  . Sexual activity: Yes    Birth control/protection: None    Comment: heroin  Lifestyle  . Physical activity:    Days per week: Not on file    Minutes per session: Not on file  . Stress: Not on file  Relationships  . Social connections:    Talks on phone: Not on file    Gets together: Not on file    Attends religious service: Not on file    Active member of club or organization: Not on file    Attends meetings of clubs or organizations: Not on file    Relationship status: Not on file  Other Topics Concern  . Not on file  Social History Narrative  . Not on file   Additional Social History:                         Sleep: Poor  Appetite:  Good  Current Medications: Current Facility-Administered Medications  Medication Dose Route Frequency Provider Last Rate Last Dose  . acetaminophen (TYLENOL) tablet 650 mg  650 mg Oral Q6H PRN Fransisca Kaufmannavis, Laura A, NP   650 mg at 03/07/18 1327  . albuterol (PROVENTIL HFA;VENTOLIN HFA) 108 (90 Base) MCG/ACT inhaler 2 puff  2 puff Inhalation Q6H PRN Antonieta Pertlary, Cruz Devilla Lawson, MD   2 puff at 03/10/18 1204  . busPIRone (BUSPAR) tablet 15 mg  15 mg Oral TID Antonieta Pertlary, Aleksey Newbern Lawson, MD   15 mg at 03/10/18 1204  . famotidine (PEPCID) tablet 20 mg  20 mg Oral BID Antonieta Pertlary, Baylen Buckner Lawson, MD   20 mg at 03/10/18 0802  . hydrOXYzine (ATARAX/VISTARIL) tablet 25 mg  25 mg Oral Q6H PRN Fransisca Kaufmannavis, Laura A, NP   25 mg at 03/09/18 1300  . magnesium hydroxide (MILK OF MAGNESIA) suspension 30 mL  30 mL Oral  Daily PRN Thermon Leylandavis, Laura A, NP      . methadone (DOLOPHINE) tablet 75 mg  75 mg Oral Daily Nira ConnBerry, Jason A, NP   75 mg at 03/10/18 0802  . methocarbamol (ROBAXIN) tablet 500 mg  500 mg Oral Q6H PRN Antonieta Pertlary, Tylena Prisk Lawson, MD   500 mg at 03/10/18 1056  . multivitamin with minerals tablet 1 tablet  1 tablet Oral Daily Nira ConnBerry, Jason A, NP   1 tablet at 03/10/18 0802  . nicotine (NICODERM CQ -  dosed in mg/24 hours) patch 21 mg  21 mg Transdermal Daily Nira Conn A, NP   21 mg at 03/10/18 0802  . pantoprazole (PROTONIX) EC tablet 40 mg  40 mg Oral Daily Antonieta Pert, MD   40 mg at 03/10/18 0802  . QUEtiapine (SEROQUEL) tablet 200 mg  200 mg Oral QHS Antonieta Pert, MD      . sertraline (ZOLOFT) tablet 50 mg  50 mg Oral Daily Antonieta Pert, MD   50 mg at 03/10/18 0802  . thiamine (VITAMIN B-1) tablet 100 mg  100 mg Oral Daily Nira Conn A, NP   100 mg at 03/10/18 0802  . topiramate (TOPAMAX) tablet 50 mg  50 mg Oral QHS Antonieta Pert, MD        Lab Results:  Results for orders placed or performed during the hospital encounter of 03/06/18 (from the past 48 hour(s))  Hepatic function panel     Status: Abnormal   Collection Time: 03/09/18  6:36 AM  Result Value Ref Range   Total Protein 6.7 6.5 - 8.1 g/dL   Albumin 3.4 (L) 3.5 - 5.0 g/dL   AST 409 (H) 15 - 41 U/L   ALT 242 (H) 0 - 44 U/L   Alkaline Phosphatase 124 38 - 126 U/L   Total Bilirubin 0.6 0.3 - 1.2 mg/dL   Bilirubin, Direct 0.2 0.0 - 0.2 mg/dL   Indirect Bilirubin 0.4 0.3 - 0.9 mg/dL    Comment: Performed at [facility], [address] [place], [place] [address]  Protime-INR     Status: None   Collection Time: 03/10/18  6:42 AM  Result Value Ref Range   Prothrombin Time 12.9 11.4 - 15.2 seconds   INR 0.99     Comment: Performed at [facility], [address], [place], [place] [address]    Blood Alcohol level:  Lab Results  Component Value Date   ETH <10 03/05/2018   ETH  <10 12/16/2017    Metabolic Disorder Labs: No results found for: HGBA1C, MPG No results found for: PROLACTIN No results found for: CHOL, TRIG, HDL, CHOLHDL, VLDL, LDLCALC  Physical Findings: AIMS: Facial and Oral Movements Muscles of Facial Expression: None, normal Lips and Perioral Area: None, normal Jaw: None, normal Tongue: None, normal,Extremity Movements Upper (arms, wrists, hands, fingers): None, normal Lower (legs, knees, ankles, toes): None, normal, Trunk Movements Neck, shoulders, hips: None, normal, Overall Severity Severity of abnormal movements (highest score from questions above): None, normal Incapacitation due to abnormal movements: None, normal Patient's awareness of abnormal movements (rate only patient's report): No Awareness, Dental Status Current problems with teeth and/or dentures?: No Does patient usually wear dentures?: No  CIWA:  CIWA-Ar Total: 0 COWS:     Musculoskeletal: Strength & Muscle Tone: within normal limits Gait & Station: normal Patient leans: N/A  Psychiatric Specialty Exam: Physical Exam  Nursing note and vitals reviewed. Constitutional: He is oriented to person, place, and time. He appears well-developed and well-nourished.  HENT:  Head: Normocephalic and atraumatic.  Respiratory: Effort normal.  Neurological: He is alert and oriented to person, place, and time.    ROS  Blood pressure 126/73, pulse 85, temperature 97.7 F (36.5 C), temperature source Oral, resp. rate 18, height 6' (1.829 m), weight 120.2 kg (265 lb), SpO2 97 %.Body mass index is 35.94 kg/m.  General Appearance: Disheveled  Eye Contact:  Fair  Speech:  Normal Rate  Volume:  Decreased  Mood:  Depressed  Affect:  Congruent  Thought Process:  Coherent  Orientation:  Full (Time, Place, and Person)  Thought Content:  Logical  Suicidal Thoughts:  Yes.  without intent/plan  Homicidal Thoughts:  No  Memory:  Immediate;   Fair Recent;   Fair Remote;   Fair   Judgement:  Impaired  Insight:  Fair  Psychomotor Activity:  Psychomotor Retardation  Concentration:  Concentration: Fair and Attention Span: Fair  Recall:  [employer] of Knowledge:  Fair  Language:  Fair  Akathisia:  Negative  Handed:  Right  AIMS (if indicated):     Assets:  Communication Skills Desire for Improvement Resilience Talents/Skills  ADL's:  Intact  Cognition:  WNL  Sleep:  Number of Hours: 6.75     Treatment Plan Summary: Daily contact with patient to assess and evaluate symptoms and progress in treatment, Medication management and Plan : Patient is seen and examined.  Patient is a 47 year old male with the above-stated past psychiatric history seen in follow-up.  He continues to not really improved.  We are going to make some changes to his medications.  I am going to stop the prazosin for nightmares, and try placing him on Topamax 50 mg p.o. nightly.  I am also going to increase his Seroquel to 200 mg p.o. nightly.  I am also going to increase his BuSpar to 15 mg p.o. 3 times daily, and also increase his Zoloft 200 mg p.o. daily.  Hopefully this will help with his mood, anxiety and sleep as well as nightmares and flashbacks.  With regard to his liver function enzymes, we have repeat liver function enzymes pending for the morning.  His PT and INR came back and his INR is mildly elevated but PT is stable.  He has an ammonia, liver function enzymes, and hepatitis panel scheduled for the a.m.  The methadone may also be playing a role in his liver function enzymes, and we may have to address the Seroquel and/or methadone if his liver function enzymes remain abnormal.  Antonieta Pert, MD 03/10/2018, 12:46 PM

## 2018-03-10 NOTE — Plan of Care (Signed)
  Problem: Education: Goal: Knowledge of [facility] Education information/materials will improve Outcome: Progressing   Problem: Education: Goal: Verbalization of understanding the information provided will improve Outcome: Progressing   Problem: Activity: Goal: Interest or engagement in activities will improve Outcome: Progressing    Problem: Coping: Goal: Ability to demonstrate self-control will improve Outcome: Progressing

## 2018-03-10 NOTE — Progress Notes (Signed)
D: Pt was in dayroom upon initial approach.  Pt presents with anxious, depressed affect and mood.  He describes his day as "rough" and reports goal is "trying to get this depression off of me."  Pt denies HI, denies hallucinations, reports chronic generalized pain of 7/10.  He reports SI without a plan and verbally contracts for safety.  Pt has been visible in milieu interacting with peers and staff appropriately.  Pt attended evening group.    A: Introduced self to pt.  Actively listened to pt and offered support and encouragement. Medications administered per order.  PRN medication administered for anxiety, muscle spasms, and shortness of breath.  Q15 minute safety checks maintained.  R: Pt is safe on the unit.  Pt is compliant with medications.  Pt verbally contracts for safety.  Will continue to monitor and assess.

## 2018-03-10 NOTE — Progress Notes (Signed)
Adult Psychoeducational Group Note  Date:  03/10/2018 Time:  10:09 AM  Group Topic/Focus:  Orientation:   The focus of this group is to educate the patient on the purpose and policies of crisis stabilization and provide a format to answer questions about their admission.  The group details unit policies and expectations of patients while admitted.  Participation Level:  Minimal  Participation Quality:  Drowsy  Affect:  Appropriate  Cognitive:  Appropriate  Insight: Limited  Engagement in Group:  Limited  Modes of Intervention:  Discussion and Education  Additional Comments:    Pt participated in orientation/ goals group. Pt was drowsy, but trying to stay awake. Pt's goal today is to talk to his social worker about long term treatment after discharge.   Karren CobbleFizah G Wren Gallaga 03/10/2018, 10:09 AM

## 2018-03-10 NOTE — Progress Notes (Signed)
Patient self inventory- Patient slept poor last night, sleep medication was not requested. Appetite has been fair, energy level high, and concentration good. Depression, hopelessness, and anxiety rated 9, 8, 7. Endorses passive SI. Patient endorses withdrawal symptoms such as tremors, diarrhea, cravings, agitation, and cramping. Endorses physical problems such as pain in lower back and leg (8/10) and headache. Patient said it is important to work on "prode in myself" and will "listen to positivity."  Patient Denied SI HI AVH with Clinical research associatewriter when speaking this morning. Compliant with medications prescribed per provider. Safety maintained with 15 minute checks. Will continue to monitor.

## 2018-03-10 NOTE — Progress Notes (Signed)
Pt attend wrap up group. His day was a 9. The one good thing that happened to him today he saw his father and it was a positive thing. 1 year ago today since his death. His dad anniversary.

## 2018-03-10 NOTE — Progress Notes (Signed)
Recreation Therapy Notes  Date: 8.2.19 Time: 0930 Location: [address] Dayroom  Group Topic: Stress Management  Goal Area(s) Addresses:  Patient will verbalize importance of using healthy stress management.  Patient will identify positive emotions associated with healthy stress management.   Intervention: Stress Management  Activity : Meditation.  LRT introduced the stress management technique of meditation.  LRT played a meditation on gratitude.  Patients were to follow along as meditation played to engage in activity.  Education:  Stress Management, Discharge Planning.   Education Outcome: Acknowledges edcuation/In group clarification offered/Needs additional education  Clinical Observations/Feedback: Pt did not attend group.     Caroll RancherMarjette Jonavon Trieu, LRT/CTRS         Lillia AbedLindsay, Jandi Swiger A 03/10/2018 11:05 AM

## 2018-03-11 LAB — HEPATIC FUNCTION PANEL
ALT: 285 U/L — ABNORMAL HIGH (ref 0–44)
AST: 273 U/L — ABNORMAL HIGH (ref 15–41)
Albumin: 3.2 g/dL — ABNORMAL LOW (ref 3.5–5.0)
Alkaline Phosphatase: 116 U/L (ref 38–126)
Bilirubin, Direct: 0.2 mg/dL (ref 0.0–0.2)
Indirect Bilirubin: 0.5 mg/dL (ref 0.3–0.9)
Total Bilirubin: 0.7 mg/dL (ref 0.3–1.2)
Total Protein: 6.3 g/dL — ABNORMAL LOW (ref 6.5–8.1)

## 2018-03-11 LAB — AMMONIA: Ammonia: 62 umol/L — ABNORMAL HIGH (ref 9–35)

## 2018-03-11 MED ORDER — DOXEPIN HCL 50 MG PO CAPS: 50.0000 mg | ORAL_CAPSULE | Freq: Every day | ORAL | Status: DC

## 2018-03-11 MED ORDER — DOXEPIN HCL 50 MG PO CAPS
50.0000 mg | ORAL_CAPSULE | Freq: Every day | ORAL | Status: DC
  Administered 2018-03-11 – 2018-03-16 (×6): 50 mg via ORAL
  Filled 2018-03-11 (×4): qty 1
  Filled 2018-03-11: qty 2
  Filled 2018-03-11 (×4): qty 1

## 2018-03-11 MED ORDER — DOXEPIN HCL 75 MG PO CAPS: 75.0000 mg | ORAL_CAPSULE | Freq: Every day | ORAL | Status: DC

## 2018-03-11 NOTE — Progress Notes (Signed)
Adult Psychoeducational Group Note  Date:  03/11/2018 Time:  3:27 AM  Group Topic/Focus:  Wrap-Up Group:   The focus of this group is to help patients review their daily goal of treatment and discuss progress on daily workbooks.  Participation Level:  Active  Participation Quality:  Appropriate  Affect:  Appropriate  Cognitive:  Alert  Insight: Appropriate  Engagement in Group:  Engaged  Modes of Intervention:  Discussion  Additional Comments:  Pt stated he had a pretty good day. Pt spoke to his kids; which made his day.  Pt rated the day at a 8/10.  Shawntay Prest 03/11/2018, 3:27 AM

## 2018-03-11 NOTE — Progress Notes (Signed)
[facility]  03/11/2018 4:26 PM Scott Kettleelson [name]  MRN:  [id number] Subjective: Patient is seen and examined.  Patient's 47 year old male with a past psychiatric history significant for major depression, alcohol use disorder/dependence and probable posttraumatic stress disorder is seen in follow-up.  He continues to have problems with sleep.  His liver function enzymes came back, and they are continuing to increase.  His ammonia is at 63.  We discussed the need to eliminate the Seroquel.  His mood continues to slowly improve, but his liver function may be causing some problems with that.  We discussed the potential of returning to trazodone as well as trying doxepin.  The trazodone led to headaches for him in the past.  We may also have to change the sertraline to something else if his liver does not start improving.  He does not appear to be having a great deal of cognitive problems, so I am in a hold off on any lactulose at this point. Principal Problem: <principal problem not specified> Diagnosis:   Patient Active Problem List   Diagnosis Date Noted  . MDD (major depressive disorder), recurrent severe, without psychosis (HCC) [F33.2] 03/06/2018  . MDD (major depressive disorder), severe (HCC) [F32.2] 12/16/2017  . Schizoaffective disorder (HCC) [F25.9] 06/07/2017  . Alcohol use disorder, severe, dependence (HCC) [F10.20] 07/24/2015  . Alcohol withdrawal (HCC) [F10.239] 07/24/2015  . Cocaine use disorder, mild, abuse (HCC) [F14.10] 07/24/2015  . Substance induced mood disorder (HCC) [F19.94] 07/22/2015  . Drug-seeking behavior [Z76.5] 02/16/2015  . PTSD (post-traumatic stress disorder) [F43.10] 12/26/2014  . Opioid type dependence, continuous (HCC) [F11.20] 12/26/2014  . Major depressive disorder, recurrent episode, moderate (HCC) [F33.1]   . Chest pain at rest [R07.9] 10/22/2013  . Chronic pain syndrome [G89.4] 10/22/2013  . Seizure disorder (HCC) [G40.909] 10/22/2013  . Chest pain  [R07.9] 10/21/2013   Total Time spent with patient: 15 minutes  Past Psychiatric History: See admission H&P  Past Medical History:  Past Medical History:  Diagnosis Date  . Accidental heroin overdose (HCC)   . Anxiety   . Back pain   . Bipolar 1 disorder (HCC)   . Chronic back pain   . Depression   . Drug-seeking behavior   . GERD (gastroesophageal reflux disease)   . Heroin abuse (HCC)   . History of ETOH abuse   . Myocardial infarction [facility](HCC)    Pt says that the doctor determined it was panic attacks  . Opioid dependence (HCC)   . PTSD (post-traumatic stress disorder)   . Seizures (HCC)    alcohol induced- pt reports sober for 6 months    Past Surgical History:  Procedure Laterality Date  . ABDOMINAL SURGERY    . APPENDECTOMY    . BACK SURGERY     Family History:  Family History  Problem Relation Age of Onset  . Hypertension Mother   . CAD Father   . COPD Father   . Stroke Father   . Testicular cancer Brother    Family Psychiatric  History: See admission H&P Social History:  Social History   Substance and Sexual Activity  Alcohol Use Not Currently   Comment: Pt reports sober for 6 months     Social History   Substance and Sexual Activity  Drug Use Yes  . Types: Cocaine, Methamphetamines, Marijuana, IV   Comment: Pt says not sure why positive for cocaine    Social History   Socioeconomic History  . Marital status: Married  Spouse name: Not on file  . Number of children: Not on file  . Years of education: Not on file  . Highest education level: Not on file  Occupational History  . Not on file  Social Needs  . Financial resource strain: Not on file  . Food insecurity:    Worry: Not on file    Inability: Not on file  . Transportation needs:    Medical: Not on file    Non-medical: Not on file  Tobacco Use  . Smoking status: Current Every Day Smoker    Packs/day: 1.50    Types: Cigarettes  . Smokeless tobacco: Never Used  Substance and  Sexual Activity  . Alcohol use: Not Currently    Comment: Pt reports sober for 6 months  . Drug use: Yes    Types: Cocaine, Methamphetamines, Marijuana, IV    Comment: Pt says not sure why positive for cocaine  . Sexual activity: Yes    Birth control/protection: None    Comment: heroin  Lifestyle  . Physical activity:    Days per week: Not on file    Minutes per session: Not on file  . Stress: Not on file  Relationships  . Social connections:    Talks on phone: Not on file    Gets together: Not on file    Attends religious service: Not on file    Active member of club or organization: Not on file    Attends meetings of clubs or organizations: Not on file    Relationship status: Not on file  Other Topics Concern  . Not on file  Social History Narrative  . Not on file   Additional Social History:                         Sleep: Poor  Appetite:  Fair  Current Medications: Current Facility-Administered Medications  Medication Dose Route Frequency Provider Last Rate Last Dose  . acetaminophen (TYLENOL) tablet 650 mg  650 mg Oral Q6H PRN Fransisca Kaufmann A, NP   650 mg at 03/07/18 1327  . albuterol (PROVENTIL HFA;VENTOLIN HFA) 108 (90 Base) MCG/ACT inhaler 2 puff  2 puff Inhalation Q6H PRN Antonieta Pert, MD   2 puff at 03/10/18 1940  . busPIRone (BUSPAR) tablet 15 mg  15 mg Oral TID Antonieta Pert, MD   15 mg at 03/11/18 1306  . doxepin (SINEQUAN) capsule 75 mg  75 mg Oral QHS Antonieta Pert, MD      . famotidine (PEPCID) tablet 20 mg  20 mg Oral BID Antonieta Pert, MD   20 mg at 03/11/18 0835  . hydrOXYzine (ATARAX/VISTARIL) tablet 25 mg  25 mg Oral Q6H PRN Thermon Leyland, NP   25 mg at 03/10/18 1942  . magnesium hydroxide (MILK OF MAGNESIA) suspension 30 mL  30 mL Oral Daily PRN Thermon Leyland, NP      . methadone (DOLOPHINE) tablet 75 mg  75 mg Oral Daily Nira Conn A, NP   75 mg at 03/11/18 0841  . methocarbamol (ROBAXIN) tablet 500 mg  500 mg Oral  Q6H PRN Antonieta Pert, MD   500 mg at 03/11/18 1101  . multivitamin with minerals tablet 1 tablet  1 tablet Oral Daily Nira Conn A, NP   1 tablet at 03/11/18 0835  . nicotine (NICODERM CQ - dosed in mg/24 hours) patch 21 mg  21 mg Transdermal Daily Jackelyn Poling, NP  21 mg at 03/11/18 0834  . pantoprazole (PROTONIX) EC tablet 40 mg  40 mg Oral Daily Antonieta Pert, MD   40 mg at 03/11/18 0834  . sertraline (ZOLOFT) tablet 100 mg  100 mg Oral Daily Antonieta Pert, MD   100 mg at 03/11/18 0835  . thiamine (VITAMIN B-1) tablet 100 mg  100 mg Oral Daily Nira Conn A, NP   100 mg at 03/11/18 0835  . topiramate (TOPAMAX) tablet 50 mg  50 mg Oral QHS Antonieta Pert, MD   50 mg at 03/10/18 2101    Lab Results:  Results for orders placed or performed during the hospital encounter of 03/06/18 (from the past 48 hour(s))  Protime-INR     Status: None   Collection Time: 03/10/18  6:42 AM  Result Value Ref Range   Prothrombin Time 12.9 11.4 - 15.2 seconds   INR 0.99     Comment: Performed at [facility], [address] [place], [place] [address]  Hepatic function panel     Status: Abnormal   Collection Time: 03/11/18  6:28 AM  Result Value Ref Range   Total Protein 6.3 (L) 6.5 - 8.1 g/dL   Albumin 3.2 (L) 3.5 - 5.0 g/dL   AST 657 (H) 15 - 41 U/L   ALT 285 (H) 0 - 44 U/L   Alkaline Phosphatase 116 38 - 126 U/L   Total Bilirubin 0.7 0.3 - 1.2 mg/dL   Bilirubin, Direct 0.2 0.0 - 0.2 mg/dL   Indirect Bilirubin 0.5 0.3 - 0.9 mg/dL    Comment: Performed at [facility], [address] [place], [place] [address]  Ammonia     Status: Abnormal   Collection Time: 03/11/18  6:28 AM  Result Value Ref Range   Ammonia 62 (H) 9 - 35 umol/L    Comment: Performed at [facility], [address], [place], [place] [address]    Blood Alcohol level:  Lab Results  Component Value Date   ETH <10 03/05/2018   ETH <10 12/16/2017     Metabolic Disorder Labs: No results found for: HGBA1C, MPG No results found for: PROLACTIN No results found for: CHOL, TRIG, HDL, CHOLHDL, VLDL, LDLCALC  Physical Findings: AIMS: Facial and Oral Movements Muscles of Facial Expression: None, normal Lips and Perioral Area: None, normal Jaw: None, normal Tongue: None, normal,Extremity Movements Upper (arms, wrists, hands, fingers): None, normal Lower (legs, knees, ankles, toes): None, normal, Trunk Movements Neck, shoulders, hips: None, normal, Overall Severity Severity of abnormal movements (highest score from questions above): None, normal Incapacitation due to abnormal movements: None, normal Patient's awareness of abnormal movements (rate only patient's report): No Awareness, Dental Status Current problems with teeth and/or dentures?: No Does patient usually wear dentures?: No  CIWA:  CIWA-Ar Total: 2 COWS:     Musculoskeletal: Strength & Muscle Tone: within normal limits Gait & Station: normal Patient leans: N/A  Psychiatric Specialty Exam: Physical Exam  Constitutional: He is oriented to person, place, and time. He appears well-developed and well-nourished.  HENT:  Head: Normocephalic and atraumatic.  Respiratory: Effort normal.  Neurological: He is alert and oriented to person, place, and time.    ROS  Blood pressure 138/81, pulse 64, temperature 98.6 F (37 C), temperature source Oral, resp. rate 18, height 6' (1.829 m), weight 120.2 kg (265 lb), SpO2 96 %.Body mass index is 35.94 kg/m.  General Appearance: Casual  Eye Contact:  Fair  Speech:  Normal  Rate  Volume:  Normal  Mood:  Anxious  Affect:  Congruent  Thought Process:  Coherent  Orientation:  Full (Time, Place, and Person)  Thought Content:  Logical  Suicidal Thoughts:  Yes.  without intent/plan  Homicidal Thoughts:  No  Memory:  Immediate;   Fair Recent;   Fair Remote;   Fair  Judgement:  Intact  Insight:  Fair  Psychomotor Activity:   Increased  Concentration:  Concentration: Fair and Attention Span: Fair  Recall:  [employer] of Knowledge:  Fair  Language:  Fair  Akathisia:  Negative  Handed:  Right  AIMS (if indicated):     Assets:  Desire for Improvement Housing Resilience Social Support  ADL's:  Intact  Cognition:  WNL  Sleep:  Number of Hours: 3.5     Treatment Plan Summary: Daily contact with patient to assess and evaluate symptoms and progress in treatment, Medication management and Plan : Patient is seen and examined.  Patient is a 47 year old male with the above-stated past psychiatric history seen in follow-up.  From a psychiatric perspective the only major issue that is not slowly improving is asleep.  He had headaches with trazodone, and his liver function enzymes believe are affected by the Seroquel.  I am going to have to stop the Seroquel today.  Additionally given the fact that he continued to have some nightmares with the Topamax I am going to stop it.  We will try some doxepin.  Given his failure with other medicines I must start out of the higher dose than usual.  We will go with 75 mg p.o. nightly and titrate up from that.  As stated above, his liver function enzymes are still going up, and I hope they have reached their nadir.  Hopefully by removing the Seroquel, Topamax and the prazosin previously that they will begin to decrease.  His ammonia was 63, but he does not appear to be having any cognitive issues at this point.  As well, with his alcohol intake clearly that could be playing a role in all of this as well.  Hopefully they will begin to improve.  Antonieta Pert, MD 03/11/2018, 4:26 PM

## 2018-03-11 NOTE — Progress Notes (Signed)
D: Pt was in dayroom upon initial approach.  Pt presents with anxious, depressed affect and mood.  He describes his day as "so so" and reports goal is to "try to get over this depression, not feel so worthless."  Pt denies SI/HI, denies hallucinations, reports chronic back pain of 7/10.  Pt has been visible in milieu interacting with peers and staff appropriately.  Pt attended evening group.    A: Introduced self to pt.  Actively listened to pt and offered support and encouragement. Medications administered per order.  PRN medication administered for muscle spasms and anxiety.  Q15 minute safety checks maintained.  R: Pt is safe on the unit.  Pt is compliant with medications.  Pt verbally contracts for safety.  Will continue to monitor and assess.

## 2018-03-11 NOTE — Progress Notes (Signed)
D Pt is observed OOB UAL on the 400 hall today tolerated fair. His affect is flat, depressed and he is observed dozing during Life SKills group today. He requested and received a prn dose of robaxin for " pain all over" this am/    A HE completed his daily assessment. On this, he wrote he has had periods of SI and he contracts verbally with writer to not hurt himslef. He rated his depression, hopelessness and anxiety " 8/8/7", respectively.    R Safety in place.

## 2018-03-11 NOTE — Plan of Care (Signed)
  Problem: Education: Goal: Mental status will improve Outcome: Progressing   

## 2018-03-11 NOTE — [facility] Group Notes (Signed)
[facility] Group Notes:  (Nursing/MHT/Case Management/Adjunct)  Date:  03/11/2018  Time:  2:45 PM  Type of Therapy:  Psychoeducational Skills  Participation Level:  Active  Participation Quality:  Appropriate  Affect:  Appropriate  Cognitive:  Appropriate  Insight:  Appropriate  Engagement in Group:  Engaged  Modes of Intervention:  Problem-solving  Summary of Progress/Problems: Pt attended Psychoeducational group with top topic anger management.    Jacquelyne BalintForrest, Scott [name] Shanta 03/11/2018, 2:45 PM

## 2018-03-11 NOTE — [facility] Group Notes (Signed)
LCSW Group Therapy Note  03/11/2018   10:00--11:00am   Type of Therapy and Topic:  Group Therapy: Anger Cues and Responses  Participation Level:  Active   Description of Group:   In this group, patients learned how to recognize the physical, cognitive, emotional, and behavioral responses they have to anger-provoking situations.  They identified a recent time they became angry and how they reacted.  They analyzed how their reaction was possibly beneficial and how it was possibly unhelpful.  The group discussed a variety of healthier coping skills that could help with such a situation in the future.  Deep breathing was practiced briefly.  Therapeutic Goals: 1. Patients will remember their last incident of anger and how they felt emotionally and physically, what their thoughts were at the time, and how they behaved. 2. Patients will identify how their behavior at that time worked for them, as well as how it worked against them. 3. Patients will explore possible new behaviors to use in future anger situations. 4. Patients will learn that anger itself is normal and cannot be eliminated, and that healthier reactions can assist with resolving conflict rather than worsening situations.  Summary of Patient Progress:  The patient was active in group and made several insightful comments to his fellow group members. The patient expressed the importance of utilizing his time in inpatient to develop strategies to stay well, to regroup and move in a positive direction. He described artwork and writing in his journal as ways of coping with anger and other intense feelings. Therapeutic Modalities:   Cognitive Behavioral Therapy  Evorn Gongonnie D. Dion Parrow  LCSW

## 2018-03-12 LAB — BASIC METABOLIC PANEL
Anion gap: 9 (ref 5–15)
BUN: 11 mg/dL (ref 6–20)
CO2: 27 mmol/L (ref 22–32)
Calcium: 8.7 mg/dL — ABNORMAL LOW (ref 8.9–10.3)
Chloride: 106 mmol/L (ref 98–111)
Creatinine, Ser: 0.62 mg/dL (ref 0.61–1.24)
GFR calc Af Amer: >60 mL/min (ref >60)
GFR calc non Af Amer: >60 mL/min (ref >60)
Glucose, Bld: 98 mg/dL (ref 70–99)
Potassium: 3.8 mmol/L (ref 3.5–5.1)
Sodium: 142 mmol/L (ref 135–145)

## 2018-03-12 LAB — MAGNESIUM: Magnesium: 2 mg/dL (ref 1.7–2.4)

## 2018-03-12 NOTE — Progress Notes (Signed)
[facility] MD Progress Note  03/12/2018 10:22 AM Scott [name]  MRN:  [id number] Subjective: Patient is a 47 year old male with a past psychiatric history significant for major depression, alcohol use disorder/dependence, opiate dependence and post traumatic stress disorder.  He seen in follow-up.  He stated he is feeling better today.  He feels like his mood is better.  He slept well last night with the doxepin.  He denied any side effects of that.  His metabolic panel came back and his magnesium and potassium are both fine.  His EKG is pending for this morning.  We discussed the methadone, and he is on that for opiate dependence.  He still inquisitive about his residential options after discharge.  He denied any suicidal ideation this.  We discussed his liver function enzymes.  He has pneumonia and liver function enzymes pending for the a.m. Principal Problem: <principal problem not specified> Diagnosis:   Patient Active Problem List   Diagnosis Date Noted  . MDD (major depressive disorder), recurrent severe, without psychosis ([place]) [F33.2] 03/06/2018  . MDD (major depressive disorder), severe ([place]) [F32.2] 12/16/2017  . Schizoaffective disorder ([place]) [F25.9] 06/07/2017  . Alcohol use disorder, severe, dependence ([place]) [F10.20] 07/24/2015  . Alcohol withdrawal ([place]) [F10.239] 07/24/2015  . Cocaine use disorder, mild, abuse ([place]) [F14.10] 07/24/2015  . Substance induced mood disorder ([place]) [F19.94] 07/22/2015  . Drug-seeking behavior [Z76.5] 02/16/2015  . PTSD (post-traumatic stress disorder) [F43.10] 12/26/2014  . Opioid type dependence, continuous ([place]) [F11.20] 12/26/2014  . Major depressive disorder, recurrent episode, moderate ([facility]) [F33.1]   . Chest pain at rest [R07.9] 10/22/2013  . Chronic pain syndrome [G89.4] 10/22/2013  . Seizure disorder ([place]) [O24.235] 10/22/2013  . Chest pain [R07.9] 10/21/2013   Total Time spent with patient: 15 minutes  Past Psychiatric History: See admission  H&P  Past Medical History:  Past Medical History:  Diagnosis Date  . Accidental heroin overdose ([place])   . Anxiety   . Back pain   . Bipolar 1 disorder ([place])   . Chronic back pain   . Depression   . Drug-seeking behavior   . GERD (gastroesophageal reflux disease)   . Heroin abuse ([place])   . History of ETOH abuse   . Myocardial infarction [facility])    Pt says that the doctor determined it was panic attacks  . Opioid dependence ([place])   . PTSD (post-traumatic stress disorder)   . Seizures ([place])    alcohol induced- pt reports sober for 6 months    Past Surgical History:  Procedure Laterality Date  . ABDOMINAL SURGERY    . APPENDECTOMY    . BACK SURGERY     Family History:  Family History  Problem Relation Age of Onset  . Hypertension Mother   . CAD Father   . COPD Father   . Stroke Father   . Testicular cancer Brother    Family Psychiatric  History: See admission H&P Social History:  Social History   Substance and Sexual Activity  Alcohol Use Not Currently   Comment: Pt reports sober for 6 months     Social History   Substance and Sexual Activity  Drug Use Yes  . Types: Cocaine, Methamphetamines, Marijuana, IV   Comment: Pt says not sure why positive for cocaine    Social History   Socioeconomic History  . Marital status: Married    Spouse name: Not on file  . Number of children: Not on file  . Years of education: Not on file  .  Highest education level: Not on file  Occupational History  . Not on file  Social Needs  . Financial resource strain: Not on file  . Food insecurity:    Worry: Not on file    Inability: Not on file  . Transportation needs:    Medical: Not on file    Non-medical: Not on file  Tobacco Use  . Smoking status: Current Every Day Smoker    Packs/day: 1.50    Types: Cigarettes  . Smokeless tobacco: Never Used  Substance and Sexual Activity  . Alcohol use: Not Currently    Comment: Pt reports sober for 6 months  . Drug use: Yes     Types: Cocaine, Methamphetamines, Marijuana, IV    Comment: Pt says not sure why positive for cocaine  . Sexual activity: Yes    Birth control/protection: None    Comment: heroin  Lifestyle  . Physical activity:    Days per week: Not on file    Minutes per session: Not on file  . Stress: Not on file  Relationships  . Social connections:    Talks on phone: Not on file    Gets together: Not on file    Attends religious service: Not on file    Active member of club or organization: Not on file    Attends meetings of clubs or organizations: Not on file    Relationship status: Not on file  Other Topics Concern  . Not on file  Social History Narrative  . Not on file   Additional Social History:                         Sleep: Good  Appetite:  Good  Current Medications: Current Facility-Administered Medications  Medication Dose Route Frequency Provider Last Rate Last Dose  . acetaminophen (TYLENOL) tablet 650 mg  650 mg Oral Q6H PRN Elmarie Shiley A, NP   650 mg at 03/07/18 1327  . albuterol (PROVENTIL HFA;VENTOLIN HFA) 108 (90 Base) MCG/ACT inhaler 2 puff  2 puff Inhalation Q6H PRN Sharma Covert, MD   2 puff at 03/12/18 0745  . busPIRone (BUSPAR) tablet 15 mg  15 mg Oral TID Sharma Covert, MD   15 mg at 03/12/18 0746  . doxepin (SINEQUAN) capsule 50 mg  50 mg Oral QHS Sharma Covert, MD   50 mg at 03/11/18 2102  . famotidine (PEPCID) tablet 20 mg  20 mg Oral BID Sharma Covert, MD   20 mg at 03/12/18 0747  . hydrOXYzine (ATARAX/VISTARIL) tablet 25 mg  25 mg Oral Q6H PRN Niel Hummer, NP   25 mg at 03/11/18 2102  . magnesium hydroxide (MILK OF MAGNESIA) suspension 30 mL  30 mL Oral Daily PRN Niel Hummer, NP      . methadone (DOLOPHINE) tablet 75 mg  75 mg Oral Daily Lindon Romp A, NP   75 mg at 03/12/18 0747  . methocarbamol (ROBAXIN) tablet 500 mg  500 mg Oral Q6H PRN Sharma Covert, MD   500 mg at 03/12/18 0747  . multivitamin with minerals  tablet 1 tablet  1 tablet Oral Daily Lindon Romp A, NP   1 tablet at 03/12/18 0746  . nicotine (NICODERM CQ - dosed in mg/24 hours) patch 21 mg  21 mg Transdermal Daily Lindon Romp A, NP   21 mg at 03/12/18 0746  . pantoprazole (PROTONIX) EC tablet 40 mg  40 mg Oral Daily Brande Uncapher,  Cordie Grice, MD   40 mg at 03/12/18 0746  . sertraline (ZOLOFT) tablet 100 mg  100 mg Oral Daily Sharma Covert, MD   100 mg at 03/12/18 0746  . thiamine (VITAMIN B-1) tablet 100 mg  100 mg Oral Daily Lindon Romp A, NP   100 mg at 03/12/18 2409    Lab Results:  Results for orders placed or performed during the hospital encounter of 03/06/18 (from the past 48 hour(s))  Hepatic function panel     Status: Abnormal   Collection Time: 03/11/18  6:28 AM  Result Value Ref Range   Total Protein 6.3 (L) 6.5 - 8.1 g/dL   Albumin 3.2 (L) 3.5 - 5.0 g/dL   AST 273 (H) 15 - 41 U/L   ALT 285 (H) 0 - 44 U/L   Alkaline Phosphatase 116 38 - 126 U/L   Total Bilirubin 0.7 0.3 - 1.2 mg/dL   Bilirubin, Direct 0.2 0.0 - 0.2 mg/dL   Indirect Bilirubin 0.5 0.3 - 0.9 mg/dL    Comment: Performed at [facility], [place] [address], [place], [place] [address]  Ammonia     Status: Abnormal   Collection Time: 03/11/18  6:28 AM  Result Value Ref Range   Ammonia 62 (H) 9 - 35 umol/L    Comment: Performed at [facility], [place] [address], [place], [place] [address]  Basic metabolic panel     Status: Abnormal   Collection Time: 03/12/18  6:14 AM  Result Value Ref Range   Sodium 142 135 - 145 mmol/L   Potassium 3.8 3.5 - 5.1 mmol/L   Chloride 106 98 - 111 mmol/L   CO2 27 22 - 32 mmol/L   Glucose, Bld 98 70 - 99 mg/dL   BUN 11 6 - 20 mg/dL   Creatinine, Ser 0.62 0.61 - 1.24 mg/dL   Calcium 8.7 (L) 8.9 - 10.3 mg/dL   GFR calc non Af Amer >60 >60 mL/min   GFR calc Af Amer >60 >60 mL/min    Comment: (NOTE) The eGFR has been calculated using the CKD EPI equation. This calculation has not been  validated in all clinical situations. eGFR's persistently <60 mL/min signify possible Chronic Kidney Disease.    Anion gap 9 5 - 15    Comment: Performed at [facility], [place] [address] [place], [place] [address]  Magnesium     Status: None   Collection Time: 03/12/18  6:14 AM  Result Value Ref Range   Magnesium 2.0 1.7 - 2.4 mg/dL    Comment: Performed at [facility], [place] [address] [place], [place] [address]    Blood Alcohol level:  Lab Results  Component Value Date   ETH <10 03/05/2018   ETH <10 97/98/9211    Metabolic Disorder Labs: No results found for: HGBA1C, MPG No results found for: PROLACTIN No results found for: CHOL, TRIG, HDL, CHOLHDL, VLDL, LDLCALC  Physical Findings: AIMS: Facial and Oral Movements Muscles of Facial Expression: None, normal Lips and Perioral Area: None, normal Jaw: None, normal Tongue: None, normal,Extremity Movements Upper (arms, wrists, hands, fingers): None, normal Lower (legs, knees, ankles, toes): None, normal, Trunk Movements Neck, shoulders, hips: None, normal, Overall Severity Severity of abnormal movements (highest score from questions above): None, normal Incapacitation due to abnormal movements: None, normal Patient's awareness of abnormal movements (rate only patient's report): No Awareness, Dental Status Current problems with teeth and/or dentures?: No Does patient usually wear dentures?: No  CIWA:  CIWA-Ar Total: 2 COWS:     Musculoskeletal: Strength & Muscle Tone: within normal limits Gait & Station: normal Patient leans: N/A  Psychiatric Specialty Exam: Physical Exam  Nursing note and vitals reviewed. Constitutional: He is oriented to person, place, and time. He appears well-developed and well-nourished.  HENT:  Head: Normocephalic and atraumatic.  Respiratory: Effort normal.  Neurological: He is alert and oriented to person, place, and time.    ROS  Blood pressure 120/74,  pulse 72, temperature 98.6 F (37 C), temperature source Oral, resp. rate 20, height 6' (1.829 m), weight 120.2 kg (265 lb), SpO2 96 %.Body mass index is 35.94 kg/m.  General Appearance: Casual  Eye Contact:  Fair  Speech:  Normal Rate  Volume:  Normal  Mood:  Euthymic  Affect:  Appropriate  Thought Process:  Coherent  Orientation:  Full (Time, Place, and Person)  Thought Content:  Logical  Suicidal Thoughts:  No  Homicidal Thoughts:  No  Memory:  Immediate;   Fair Recent;   Fair Remote;   Fair  Judgement:  Intact  Insight:  Fair  Psychomotor Activity:  Normal  Concentration:  Concentration: Fair and Attention Span: Fair  Recall:  [employer] of Knowledge:  Fair  Language:  Fair  Akathisia:  Negative  Handed:  Right  AIMS (if indicated):     Assets:  Desire for Improvement Housing Resilience Social Support  ADL's:  Intact  Cognition:  WNL  Sleep:  Number of Hours: 6     Treatment Plan Summary: Daily contact with patient to assess and evaluate symptoms and progress in treatment, Medication management and Plan : Patient is seen and examined.  Patient is a 47 year old male with the above-stated past psychiatric history seen in follow-up.  His mood is better today.  His sleep is better today as well.  His electrolytes came back normal, so that is reassuring.  His EKG is pending this morning.  He denied any side effects to the doxepin.  He has liver function enzymes pending for the a.m., as well as an ammonia.  His cognition appears to be stable at this point.  No other changes to his psychiatric medications.  He seems to be improving from a mood standpoint, and I am hoping that his social work has some options for housing/substance rehabilitation that we can get him to this week.  No other changes to his medications.  Sharma Covert, MD 03/12/2018, 10:22 AM

## 2018-03-12 NOTE — [facility] Group Notes (Signed)
[facility] Group Notes:  (Nursing/MHT/Case Management/Adjunct)  Date:  03/12/2018  Time:  1:47 PM  Type of Therapy:  Psychoeducational Skills  Participation Level:  Did Not Attend  Participation Quality:  Did not attend  Affect:  Did not attend  Cognitive:  Did not attend  Insight:  None  Engagement in Group:  Did not attend  Modes of Intervention:  Did not attend  Summary of Progress/Problems: Pt did not attend Psychoeducational group with topic healthy support systems.    Jacquelyne BalintForrest, Maeson Purohit Shanta 03/12/2018, 1:47 PM

## 2018-03-12 NOTE — Plan of Care (Signed)
  Problem: Education: Goal: Emotional status will improve Outcome: Progressing   

## 2018-03-12 NOTE — Progress Notes (Signed)
D Pt is observed UAL on the 400 hall today. HE endorses a flat, depressed affect. He speaks softly. HE says he still didn't sleep " that well"  Last night. He takes his scheduled emds as planned and he attends his groups.    A He completed his daily assessment and on this he wrote he denied having SI today and he rated his depression, hopelessness and anxeity " 6/6/7", respectively. He says he is thinking about what he needs to do- when he gets discharged. He admits to this nurse that he had periods of confusion this morning- where he coulnd remember why he was here- pt educated about increased ammonia levels and requested pt keep staff up to date if he has further symptoms.    R Safety si in place.

## 2018-03-12 NOTE — [facility] Group Notes (Signed)
[facility]  Date/Time:  03/12/2018 10:00-11:00AM  Type of Therapy and Topic:  Group Therapy:  Healthy and Unhealthy Supports  Participation Level:  Active   Description of Group:  Patients in this group were introduced to the idea of adding a variety of healthy supports to address the various needs in their lives.Patients discussed what additional healthy supports could be helpful in their recovery and wellness after discharge in order to prevent future hospitalizations.   An emphasis was placed on using counselor, doctor, therapy groups, 12-step groups, and problem-specific support groups to expand supports.  They also worked as a group on developing a specific plan for several patients to deal with unhealthy supports through boundary-setting, psychoeducation with loved ones, and even termination of relationships.   Therapeutic Goals:   1)  discuss importance of adding supports to stay well once out of the hospital  2)  compare healthy versus unhealthy supports and identify some examples of each  3)  generate ideas and descriptions of healthy supports that can be added  4)  offer mutual support about how to address unhealthy supports  5)  encourage active participation in and adherence to discharge plan    Summary of Patient Progress:  The patient stated that current healthy supports in his  life are familywhile current unhealthy supports include individuals he used with   The patient expressed a willingness to build on his supportive relationships while using support groups and professional helpers such as therapist and psychiatrist in his recovery journey.   Therapeutic Modalities:   Motivational Interviewing Brief Solution-Focused Therapy  Evorn Gongonnie D Ciena Sampley

## 2018-03-12 NOTE — Plan of Care (Signed)
D: Pt denies SI/HI/AVH. Pt is pleasant and cooperative. Pt visible on the milieu this evening pt stated he was doing ok A: Pt was offered support and encouragement. Pt was given scheduled medications. Pt was encourage to attend groups. Q 15 minute checks were done for safety.  R:Pt attends groups and interacts well with peers and staff. Pt is taking medication. Pt has no complaints.Pt receptive to treatment and safety maintained on unit.   Problem: Education: Goal: Emotional status will improve Outcome: Progressing   Problem: Education: Goal: Mental status will improve Outcome: Progressing   Problem: Activity: Goal: Sleeping patterns will improve Outcome: Progressing

## 2018-03-13 ENCOUNTER — Ambulatory Visit (HOSPITAL_COMMUNITY)
Admission: AD | Admit: 2018-03-13 | Discharge: 2018-03-13 | Disposition: A | Discharge status: Home / Self Care | Payer: PPO | Source: Intra-hospital | Attending: Family Medicine | Admitting: Family Medicine

## 2018-03-13 DIAGNOSIS — R945 Abnormal results of liver function studies: Secondary | ICD-10-CM | POA: Diagnosis not present

## 2018-03-13 DIAGNOSIS — R748 Abnormal levels of other serum enzymes: Secondary | ICD-10-CM

## 2018-03-13 LAB — HEPATIC FUNCTION PANEL
ALT: 349 U/L — ABNORMAL HIGH (ref 0–44)
AST: 332 U/L — ABNORMAL HIGH (ref 15–41)
Albumin: 3.5 g/dL (ref 3.5–5.0)
Alkaline Phosphatase: 131 U/L — ABNORMAL HIGH (ref 38–126)
Bilirubin, Direct: 0.3 mg/dL — ABNORMAL HIGH (ref 0.0–0.2)
Indirect Bilirubin: 0.8 mg/dL (ref 0.3–0.9)
Total Bilirubin: 1.1 mg/dL (ref 0.3–1.2)
Total Protein: 6.7 g/dL (ref 6.5–8.1)

## 2018-03-13 LAB — HEPATITIS PANEL, ACUTE
HCV Ab: >11 s/co ratio — ABNORMAL HIGH (ref 0.0–0.9)
Hep A IgM: NEGATIVE
Hep B C IgM: NEGATIVE
Hepatitis B Surface Ag: NEGATIVE

## 2018-03-13 LAB — AMMONIA: Ammonia: 48 umol/L — ABNORMAL HIGH (ref 9–35)

## 2018-03-13 MED ORDER — METHADONE HCL 5 MG PO TABS
65.0000 mg | ORAL_TABLET | Freq: Every day | ORAL | Status: DC
  Administered 2018-03-14 – 2018-03-17 (×4): 65 mg via ORAL
  Filled 2018-03-13 (×4): qty 6

## 2018-03-13 NOTE — Progress Notes (Signed)
Recreation Therapy Notes  Date: 8.5.19 Time: 0930 Location: 300 Hall Dayroom  Group Topic: Stress Management  Goal Area(s) Addresses:  Patient will verbalize importance of using healthy stress management.  Patient will identify positive emotions associated with healthy stress management.   Intervention: Stress Management  Activity : Guided Imagery.  LRT introduced the stress management technique of guided imagery.  LRT read a script to lead patients on a journey through a meadow.  Patients were to follow along as script was read.  Education: Stress Management, Discharge Planning.   Education Outcome: Acknowledges edcuation/In group clarification offered/Needs additional education  Clinical Observations/Feedback: Pt did not attend group.    Kariyah Baugh, LRT/CTRS         Wadsworth Skolnick A 03/13/2018 11:29 AM 

## 2018-03-13 NOTE — Consult Note (Signed)
[facility] Medical Consultation  Jennette Kettleelson L Castorena WUJ:[id number] DOB: 1971-02-20 DOA: 03/06/2018 PCP: System, Pcp Not In   Requesting physician: Jola Babinskilary Date of consultation: 8/5 Reason for consultation: elevated liver enzyems  Impression/Recommendations Active Problems:   MDD (major depressive disorder), recurrent severe, without psychosis (HCC)  1. Elevated Liver Enzymes:  LFT's rising over past several days.  Hepatocellular pattern.  Hx of hepatitis C as well as chronic etoh use.  I don't see hepatitis C RNA in our system, will follow this up.  He also notes being told he has fatty liver dz.  Denies any APAP use and APAP level was negative on presentation.  He notes etoh use Clara Smolen day before presentation.  Synthetic function intact, normal INR, platelets, and no confusion.  Ammonia has been slightly elevated.  Suspect elevation may be most likely related to fatty liver, hx hep C, and medications. 1. Protonix and seroquel have been associated with abnormal LFT's (doesn't appear to be receiving seroquel at this time).  Buspar, doxepin, and prazosin are rarely associated with elevated LFT's.  Would consider stopping or decreasing some of these medications.  2. Encouraged Etoh abstinence 3. Methadone dose being decreased per psych. 4. Agree with right upper quadrant ultrasound.  Of note, he has some mild RLQ pain on exam, will follow.  Consider further imaging as needed. 5. Follow CBC, INR, CMP tomorrow. 6. Follow hepatitis C viral load.  Follow iron panel and TSH.  Consider further lab w/u for autoimmune, etc if persistently elevated or rising.  7. Acute hepatitis panel pending.  Also check hepatitis Gwendolyn Mclees IgG and hepatitis B surface Ab.  Would benefit from hepatitis Mikala Podoll and B immunization if indicated. 8. He'll need outpatient f/u with gastroenterology.    Suicidal Ideation  Depression: per psychiatry  I will followup again tomorrow. Please contact me if I can be of assistance in the meanwhile.  Thank you for this consultation.  Chief Complaint: elevated liver enzymes  HPI:  47 year old with Arno Cullers history of depression, alcohol use disorder, opiate dependence, PTSD presenting after an attempted suicide attempt.  He states that he was going to take Camora Tremain bottle of Seroquel, but did not take any because his wife stopped him.  His wife can consider bringing him here to the hospital.  He notes persistent depression, but he denies  suicidal ideation at this time.  He notes that he did not take anything during his suicide attempt.  He reports drinking  Aundrea Horace 24 ounce beers Melbert Botelho day before.  He notes that he used to drink heavily, but now cannot drink as much because he gets sick when he does.  He denies taking any Tylenol.  He notes that he takes aspirin daily.  He has Marigene Erler history of IV drug use.  He denies any family history that he knows of of liver or autoimmune disease.  He notes occasional nausea and pain under his ribs into his back that he describes as chronic and achy.  He notes that nothing really makes this better or worse.  Review of Systems:  Mariachristina Holle 10 point review of systems was complete and negative except as noted per HPI.  Past Medical History:  Diagnosis Date  . Accidental heroin overdose (HCC)   . Anxiety   . Back pain   . Bipolar 1 disorder (HCC)   . Chronic back pain   . Depression   . Drug-seeking behavior   . GERD (gastroesophageal reflux disease)   . Heroin abuse (HCC)   .  History of ETOH abuse   . Myocardial infarction [facility])    Pt says that the doctor determined it was panic attacks  . Opioid dependence (HCC)   . PTSD (post-traumatic stress disorder)   . Seizures (HCC)    alcohol induced- pt reports sober for 6 months   Past Surgical History:  Procedure Laterality Date  . ABDOMINAL SURGERY    . APPENDECTOMY    . BACK SURGERY     Social History:  reports that he has been smoking cigarettes.  He has been smoking about 1.50 packs per day. He has never used smokeless tobacco. He  reports that he drank alcohol. He reports that he has current or past drug history. Drugs: Cocaine, Methamphetamines, Marijuana, and IV.  Allergies  Allergen Reactions  . Benadryl [Diphenhydramine Hcl] Anaphylaxis and Other (See Comments)    Muscles lock up.  . Celecoxib Diarrhea, Rash and Other (See Comments)    Nose bleeds, bloating, migraines   . Diphenhydramine Anaphylaxis    Hard time breathing, convulsions,  . Prednisone Other (See Comments)    Severe Anger  . Risperidone And Related Other (See Comments)    Overly sedates  . Nsaids Diarrhea, Rash and Other (See Comments)    Nose bleeds, bloating, migraines  . Other Other (See Comments)    STEROIDS--caused anger,irritation  . Rofecoxib Diarrhea, Rash and Other (See Comments)    Nose bleeds, bloating, migraines  . Tolmetin Diarrhea, Rash and Other (See Comments)    Nose bleeds, bloating, migraines   . Trazodone And Nefazodone Other (See Comments)    headaches  . Ibuprofen Rash  . Latex Itching, Rash and Other (See Comments)    Skin cracks  . Paliperidone Palmitate Er Other (See Comments)    Overly sedates   Family History  Problem Relation Age of Onset  . Hypertension Mother   . CAD Father   . COPD Father   . Stroke Father   . Testicular cancer Brother     Prior to Admission medications   Medication Sig Start Date End Date Taking? Authorizing Provider  acetaminophen (TYLENOL) 650 MG CR tablet Take 650 mg by mouth 2 (two) times daily.    [provider]  albuterol (PROVENTIL HFA;VENTOLIN HFA) 108 (90 Base) MCG/ACT inhaler Inhale 2 puffs into the lungs every 6 (six) hours as needed for wheezing or shortness of breath.    [provider]  benztropine (COGENTIN) 1 MG tablet Take 1 mg by mouth 2 (two) times daily.    [provider]  busPIRone (BUSPAR) 10 MG tablet Take 1 tablet (10 mg total) by mouth 2 (two) times daily. Patient not taking: Reported on 03/05/2018 12/23/17   Truman Hayward, FNP   busPIRone (BUSPAR) 15 MG tablet Take 15 mg by mouth 3 (three) times daily.    [provider]  DOCUSATE SODIUM PO Take 3 capsules by mouth daily.    [provider]  hydrOXYzine (ATARAX/VISTARIL) 25 MG tablet Take 1 tablet (25 mg total) by mouth every 6 (six) hours as needed for anxiety. Patient not taking: Reported on 03/05/2018 12/23/17   Truman Hayward, FNP  METHADONE HCL PO Take 75 mg by mouth daily.     [provider]  Multiple Vitamin (MULTIVITAMIN WITH MINERALS) TABS tablet Take 1 tablet by mouth daily.    [provider]  nicotine (NICODERM CQ - DOSED IN MG/24 HOURS) 21 mg/24hr patch Place 21 mg onto the skin daily as needed (smoking cessation).  [provider]  pantoprazole (PROTONIX) 40 MG tablet Take 1 tablet (40 mg total) by mouth daily. 12/24/17   Truman Hayward, FNP  prazosin (MINIPRESS) 2 MG capsule Take 1 capsule (2 mg total) by mouth at bedtime. 12/23/17   Truman Hayward, FNP  QUEtiapine (SEROQUEL) 100 MG tablet Take 100 mg by mouth at bedtime.    [provider]  sertraline (ZOLOFT) 25 MG tablet Take 3 tablets (75 mg total) by mouth daily. Patient taking differently: Take 25 mg by mouth 2 (two) times daily.  12/24/17   Truman Hayward, FNP   Physical Exam: Blood pressure 124/82, pulse 61, temperature 98 F (36.7 C), temperature source Oral, resp. rate (!) 24, height 6' (1.829 m), weight 120.2 kg (265 lb), SpO2 98 %. Vitals:   03/13/18 0621 03/13/18 0622  BP: (!) 139/93 124/82  Pulse: (!) 53 61  Resp: (!) 24   Temp: 98 F (36.7 C)   SpO2: 98%    General: No acute distress.  Pleasant Cardiovascular: Heart sounds show Kataleya Zaugg regular rate, and rhythm. No gallops or rubs. No murmurs. No JVD. Lungs: Clear to auscultation bilaterally with good air movement. No rales, rhonchi or wheezes. Abdomen: Soft, mildly TTP in the RLQ, nondistended with normal active bowel sounds. No masses. No hepatosplenomegaly. Neurological:  Alert and oriented 3. Moves all extremities 4. Cranial nerves II through XII grossly intact. Skin: Warm and dry. No rashes or lesions. Extremities: No clubbing or cyanosis. No edema. Pedal pulses 2+. Psychiatric: Mood and affect are normal. Insight and judgment are appropriate..  Labs on Admission:  Basic Metabolic Panel: Recent Labs  Lab 03/12/18 0614  NA 142  K 3.8  CL 106  CO2 27  GLUCOSE 98  BUN 11  CREATININE 0.62  CALCIUM 8.7*  MG 2.0   Liver Function Tests: Recent Labs  Lab 03/09/18 0636 03/11/18 0628 03/13/18 0650  AST 228* 273* 332*  ALT 242* 285* 349*  ALKPHOS 124 116 131*  BILITOT 0.6 0.7 1.1  PROT 6.7 6.3* 6.7  ALBUMIN 3.4* 3.2* 3.5   No results for input(s): LIPASE, AMYLASE in the last 168 hours. Recent Labs  Lab 03/11/18 0628 03/13/18 0650  AMMONIA 62* 48*   CBC: No results for input(s): WBC, NEUTROABS, HGB, HCT, MCV, PLT in the last 168 hours. Cardiac Enzymes: No results for input(s): CKTOTAL, CKMB, CKMBINDEX, TROPONINI in the last 168 hours. BNP: Invalid input(s): POCBNP CBG: No results for input(s): GLUCAP in the last 168 hours.  Radiological Exams on Admission: No results found.  EKG: Independently reviewed. none  Time spent: over 50 minutes  Lacretia Nicks [facility] Pager [phone]  If 7PM-7AM, please contact night-coverage [email or web address] Password [email or web address] 03/13/2018, 1:18 PM

## 2018-03-13 NOTE — Progress Notes (Signed)
D: Pt was in bed in his room upon initial approach.  Pt presents with depressed affect and mood.  Pt is oriented to person, place, date, and situation.  He reports he is "just tired" and describes his day as "not too bad."  Pt reports goal is "fighting through this depression, feeling better everyday."  Pt discussed how he had CT done today and will be doing MRI tomorrow.  He voiced concern related to his liver function tests and discussed how he suggested lowering his dose of Methadone.  Pt denies SI/HI, denies hallucinations, denies pain.  Pt has been visible in milieu interacting with peers and staff appropriately.     A: Met with pt 1:1.  Actively listened to pt and offered support and encouragement. Medication administered per order.  Instructed pt that he is NPO after midnight per order and pt verbalized understanding.  MHT also informed that pt is to be NPO after midnight.  Pt informed that he will be getting MRI tomorrow and additional lab tests.  Q15 minute safety checks maintained.  R: Pt is safe on the unit.  Pt is compliant with medication.  Pt verbally contracts for safety.  Will continue to monitor and assess.

## 2018-03-13 NOTE — Progress Notes (Signed)
Pt presents with a flat affect and anxious mood. Pt rated on his self inventory sheet: Depression 7/10, anxiety 6/10 and hopelessness 6/10. Pt denies SI/HI. Pt reported fair sleep last night. Pt requested to speak with CSW in regards to his discharge plan. Pt seeking long term tx. CSW made aware of pt's request. Pt compliant with taking meds and denies any side effects.   Medications and orders reviewed with pt. Verbal support provided. Pt encouraged to attend groups. 15 minute checks performed for safety. US scheduled for today at 1400.   Pt compliant with tx plan

## 2018-03-13 NOTE — Progress Notes (Signed)
CSW met with pt to discuss discharge planning.  Plan on CSW handoff indicates residential treatment.  Pt states he is responsible for child care, his wife works, and residential is not an option for him.  Pt reports he has been attending CD-[facility] at [facility] [place] and receiving med mgmt there as well--he would like to continue this program at discharge. Winferd Humphrey, MSW, LCSW Clinical Social Worker 03/13/2018 4:12 PM

## 2018-03-13 NOTE — [facility] Group Notes (Signed)
[facility] LCSW Group Therapy Note  Date/Time: 03/13/18, 1315  Type of Therapy and Topic:  Group Therapy:  Overcoming Obstacles  Participation Level:  active  Description of Group:    In this group patients will be encouraged to explore what they see as obstacles to their own wellness and recovery. They will be guided to discuss their thoughts, feelings, and behaviors related to these obstacles. The group will process together ways to cope with barriers, with attention given to specific choices patients can make. Each patient will be challenged to identify changes they are motivated to make in order to overcome their obstacles. This group will be process-oriented, with patients participating in exploration of their own experiences as well as giving and receiving support and challenge from other group members.  Therapeutic Goals: 1. Patient will identify personal and current obstacles as they relate to admission. 2. Patient will identify barriers that currently interfere with their wellness or overcoming obstacles.  3. Patient will identify feelings, thought process and behaviors related to these barriers. 4. Patient will identify two changes they are willing to make to overcome these obstacles:    Summary of Patient Progress:  Pt identified depression and marital issues as current obstacles in his life.  Pt active during group discussion regarding positive ways to overcome obstacles.      Therapeutic Modalities:   Cognitive Behavioral Therapy Solution Focused Therapy Motivational Interviewing Relapse Prevention Therapy  Daleen SquibbGreg Rashawn Rolon, LCSW

## 2018-03-13 NOTE — Tx Team (Signed)
Interdisciplinary Treatment and Diagnostic Plan Update  03/13/2018 Time of Session: 10:20am Scott [name] MRN: [id number]  Principal Diagnosis: <principal problem not specified>  Secondary Diagnoses: Active Problems:   MDD (major depressive disorder), recurrent severe, without psychosis (HCC)   Current Medications:  Current Facility-Administered Medications  Medication Dose Route Frequency Provider Last Rate Last Dose  . acetaminophen (TYLENOL) tablet 650 mg  650 mg Oral Q6H PRN Fransisca Kaufmann A, NP   650 mg at 03/07/18 1327  . albuterol (PROVENTIL HFA;VENTOLIN HFA) 108 (90 Base) MCG/ACT inhaler 2 puff  2 puff Inhalation Q6H PRN Antonieta Pert, MD   2 puff at 03/12/18 0745  . busPIRone (BUSPAR) tablet 15 mg  15 mg Oral TID Antonieta Pert, MD   15 mg at 03/13/18 0803  . doxepin (SINEQUAN) capsule 50 mg  50 mg Oral QHS Antonieta Pert, MD   50 mg at 03/12/18 2126  . famotidine (PEPCID) tablet 20 mg  20 mg Oral BID Antonieta Pert, MD   20 mg at 03/13/18 0803  . hydrOXYzine (ATARAX/VISTARIL) tablet 25 mg  25 mg Oral Q6H PRN Thermon Leyland, NP   25 mg at 03/12/18 2126  . magnesium hydroxide (MILK OF MAGNESIA) suspension 30 mL  30 mL Oral Daily PRN Thermon Leyland, NP      . methadone (DOLOPHINE) tablet 75 mg  75 mg Oral Daily Nira Conn A, NP   75 mg at 03/13/18 0804  . multivitamin with minerals tablet 1 tablet  1 tablet Oral Daily Nira Conn A, NP   1 tablet at 03/13/18 0803  . nicotine (NICODERM CQ - dosed in mg/24 hours) patch 21 mg  21 mg Transdermal Daily Nira Conn A, NP   21 mg at 03/13/18 0804  . pantoprazole (PROTONIX) EC tablet 40 mg  40 mg Oral Daily Antonieta Pert, MD   40 mg at 03/13/18 0803  . sertraline (ZOLOFT) tablet 100 mg  100 mg Oral Daily Antonieta Pert, MD   100 mg at 03/13/18 0803  . thiamine (VITAMIN B-1) tablet 100 mg  100 mg Oral Daily Nira Conn A, NP   100 mg at 03/13/18 0803   PTA Medications: Medications Prior to Admission   Medication Sig Dispense Refill Last Dose  . acetaminophen (TYLENOL) 650 MG CR tablet Take 650 mg by mouth 2 (two) times daily.   03/05/2018 at am  . albuterol (PROVENTIL HFA;VENTOLIN HFA) 108 (90 Base) MCG/ACT inhaler Inhale 2 puffs into the lungs every 6 (six) hours as needed for wheezing or shortness of breath.   couple days ago  . benztropine (COGENTIN) 1 MG tablet Take 1 mg by mouth 2 (two) times daily.   03/05/2018 at am  . busPIRone (BUSPAR) 10 MG tablet Take 1 tablet (10 mg total) by mouth 2 (two) times daily. (Patient not taking: Reported on 03/05/2018) 30 tablet 0 Not Taking at Unknown time  . busPIRone (BUSPAR) 15 MG tablet Take 15 mg by mouth 3 (three) times daily.   03/05/2018 at am  . DOCUSATE SODIUM PO Take 3 capsules by mouth daily.   03/05/2018 at am  . hydrOXYzine (ATARAX/VISTARIL) 25 MG tablet Take 1 tablet (25 mg total) by mouth every 6 (six) hours as needed for anxiety. (Patient not taking: Reported on 03/05/2018) 30 tablet 0 Not Taking at Unknown time  . METHADONE HCL PO Take 75 mg by mouth daily.    03/05/2018 at am  . Multiple Vitamin (MULTIVITAMIN WITH MINERALS)  TABS tablet Take 1 tablet by mouth daily.   03/05/2018 at am  . nicotine (NICODERM CQ - DOSED IN MG/24 HOURS) 21 mg/24hr patch Place 21 mg onto the skin daily as needed (smoking cessation).   couple days ago  . pantoprazole (PROTONIX) 40 MG tablet Take 1 tablet (40 mg total) by mouth daily. 30 tablet 0 03/05/2018 at am  . prazosin (MINIPRESS) 2 MG capsule Take 1 capsule (2 mg total) by mouth at bedtime. 60 capsule 0 03/04/2018 at pm  . QUEtiapine (SEROQUEL) 100 MG tablet Take 100 mg by mouth at bedtime.   03/04/2018 at pm  . sertraline (ZOLOFT) 25 MG tablet Take 3 tablets (75 mg total) by mouth daily. (Patient taking differently: Take 25 mg by mouth 2 (two) times daily. ) 90 tablet 0 03/05/2018 at am    Patient Stressors: Financial difficulties Health problems Substance abuse Traumatic event  Patient Strengths: Ability for  insight Average or above average intelligence Capable of independent living Manufacturing systems engineerCommunication skills Motivation for treatment/growth Supportive family/friends  Treatment Modalities: Medication Management, Group therapy, Case management,  1 to 1 session with clinician, Psychoeducation, Recreational therapy.   Physician Treatment Plan for Primary Diagnosis: <principal problem not specified> Long Term Goal(s): Improvement in symptoms so as ready for discharge Improvement in symptoms so as ready for discharge   Short Term Goals: Ability to identify changes in lifestyle to reduce recurrence of condition will improve Ability to verbalize feelings will improve Ability to disclose and discuss suicidal ideas Ability to demonstrate self-control will improve Ability to identify and develop effective coping behaviors will improve Ability to maintain clinical measurements within normal limits will improve Compliance with prescribed medications will improve Ability to identify triggers associated with substance abuse/mental health issues will improve Ability to identify changes in lifestyle to reduce recurrence of condition will improve Ability to verbalize feelings will improve Ability to disclose and discuss suicidal ideas Ability to demonstrate self-control will improve Ability to identify and develop effective coping behaviors will improve Ability to maintain clinical measurements within normal limits will improve Compliance with prescribed medications will improve Ability to identify triggers associated with substance abuse/mental health issues will improve  Medication Management: Evaluate patient's response, side effects, and tolerance of medication regimen.  Therapeutic Interventions: 1 to 1 sessions, Unit Group sessions and Medication administration.  Evaluation of Outcomes: Progressing  Physician Treatment Plan for Secondary Diagnosis: Active Problems:   MDD (major depressive disorder),  recurrent severe, without psychosis (HCC)  Long Term Goal(s): Improvement in symptoms so as ready for discharge Improvement in symptoms so as ready for discharge   Short Term Goals: Ability to identify changes in lifestyle to reduce recurrence of condition will improve Ability to verbalize feelings will improve Ability to disclose and discuss suicidal ideas Ability to demonstrate self-control will improve Ability to identify and develop effective coping behaviors will improve Ability to maintain clinical measurements within normal limits will improve Compliance with prescribed medications will improve Ability to identify triggers associated with substance abuse/mental health issues will improve Ability to identify changes in lifestyle to reduce recurrence of condition will improve Ability to verbalize feelings will improve Ability to disclose and discuss suicidal ideas Ability to demonstrate self-control will improve Ability to identify and develop effective coping behaviors will improve Ability to maintain clinical measurements within normal limits will improve Compliance with prescribed medications will improve Ability to identify triggers associated with substance abuse/mental health issues will improve     Medication Management: Evaluate patient's response, side effects,  and tolerance of medication regimen.  Therapeutic Interventions: 1 to 1 sessions, Unit Group sessions and Medication administration.  Evaluation of Outcomes: Progressing   RN Treatment Plan for Primary Diagnosis: <principal problem not specified> Long Term Goal(s): Knowledge of disease and therapeutic regimen to maintain health will improve  Short Term Goals: Ability to disclose and discuss suicidal ideas, Ability to identify and develop effective coping behaviors will improve and Compliance with prescribed medications will improve  Medication Management: RN will administer medications as ordered by provider,  will assess and evaluate patient's response and provide education to patient for prescribed medication. RN will report any adverse and/or side effects to prescribing provider.  Therapeutic Interventions: 1 on 1 counseling sessions, Psychoeducation, Medication administration, Evaluate responses to treatment, Monitor vital signs and CBGs as ordered, Perform/monitor CIWA, COWS, AIMS and Fall Risk screenings as ordered, Perform wound care treatments as ordered.  Evaluation of Outcomes: Progressing   LCSW Treatment Plan for Primary Diagnosis: <principal problem not specified> Long Term Goal(s): Safe transition to appropriate next level of care at discharge, Engage patient in therapeutic group addressing interpersonal concerns.  Short Term Goals: Engage patient in aftercare planning with referrals and resources  Therapeutic Interventions: Assess for all discharge needs, 1 to 1 time with Social worker, Explore available resources and support systems, Assess for adequacy in community support network, Educate family and significant other(s) on suicide prevention, Complete Psychosocial Assessment, Interpersonal group therapy.  Evaluation of Outcomes: Progressing   Progress in Treatment: Attending groups: Yes. Participating in groups: Yes. Taking medication as prescribed: Yes. Toleration medication: Yes. Family/Significant other contact made: No, will contact:  the patient's wife Patient understands diagnosis: Yes. Discussing patient identified problems/goals with staff: Yes. Medical problems stabilized or resolved: Yes. Denies suicidal/homicidal ideation: Yes. Issues/concerns per patient self-inventory: No. Other:  New problem(s) identified: None  New Short Term/Long Term Goal(s):Detox, medication stabilization, elimination of SI thoughts, development of comprehensive mental wellness plan.   Patient Goals: "help with my suicidal thoughts and my drinking"  Discharge Plan or Barriers: CSW  will continue to assess for appropriate referrals and discharge planning.   Reason for Continuation of Hospitalization: Depression Medication stabilization Suicidal ideation  Estimated Length of Stay:3-5 days   Attendees: Patient:  03/13/2018 11:23 AM  Physician: Dr. Landry Mellow, MD 03/13/2018 11:23 AM  Nursing: Liborio Nixon, RN 03/13/2018 11:23 AM  RN Care Manager:X 03/13/2018 11:23 AM  Social Worker: Baldo Daub, LCSWA 03/13/2018 11:23 AM  Recreational Therapist: Juliann Pares 03/13/2018 11:23 AM  Other: X 03/13/2018 11:23 AM  Other: X 03/13/2018 11:23 AM  Other:X 03/13/2018 11:23 AM    Scribe for Treatment Team: Maeola Sarah, LCSWA 03/13/2018 11:23 AM

## 2018-03-13 NOTE — Progress Notes (Signed)
[facility] MD Progress Note  03/13/2018 11:20 AM Scott [name]  MRN:  [id number] Subjective: Patient is seen and examined.  Patient's 47 year old male with a past psychiatric history significant for major depression, alcohol use disorder/dependence, opiate dependence and posttraumatic stress disorder.  He is seen in follow-up.  He stated his family came to visit him last night, but that did not go well.  It made him feel bad.  His mood is fairly stable, and is looking forward to finding a place for himself after discharge.  Unfortunately his repeat liver function enzymes show that they are still climbing.  His AST is 332 and his ALT is 349.  His ammonia went down to 48.  He remains cognitively intact.  We discussed the fact that I think that 1 of the biggest medication problems may be the methadone, and we have discussed decreasing that dosage.  I also consider decreasing his Zoloft, but at this point I think it is important to get the hospitalist/gastroenterology folks involved.  He does have a history of hepatitis C and is not been treated with any medications like Harvoni.  It would be important for him to have connections with a gastroenterologist after he is completely sober to be considered for treatment.  He denied any suicidality today. Principal Problem: <principal problem not specified> Diagnosis:   Patient Active Problem List   Diagnosis Date Noted  . MDD (major depressive disorder), recurrent severe, without psychosis ([place]) [F33.2] 03/06/2018  . MDD (major depressive disorder), severe ([place]) [F32.2] 12/16/2017  . Schizoaffective disorder ([place]) [F25.9] 06/07/2017  . Alcohol use disorder, severe, dependence ([place]) [F10.20] 07/24/2015  . Alcohol withdrawal ([place]) [F10.239] 07/24/2015  . Cocaine use disorder, mild, abuse ([place]) [F14.10] 07/24/2015  . Substance induced mood disorder ([place]) [F19.94] 07/22/2015  . Drug-seeking behavior [Z76.5] 02/16/2015  . PTSD (post-traumatic stress disorder) [F43.10]  12/26/2014  . Opioid type dependence, continuous ([place]) [F11.20] 12/26/2014  . Major depressive disorder, recurrent episode, moderate ([facility]) [F33.1]   . Chest pain at rest [R07.9] 10/22/2013  . Chronic pain syndrome [G89.4] 10/22/2013  . Seizure disorder ([place]) [W09.811] 10/22/2013  . Chest pain [R07.9] 10/21/2013   Total Time spent with patient: 15 minutes  Past Psychiatric History: See admission H&P  Past Medical History:  Past Medical History:  Diagnosis Date  . Accidental heroin overdose ([place])   . Anxiety   . Back pain   . Bipolar 1 disorder ([place])   . Chronic back pain   . Depression   . Drug-seeking behavior   . GERD (gastroesophageal reflux disease)   . Heroin abuse ([place])   . History of ETOH abuse   . Myocardial infarction [facility])    Pt says that the doctor determined it was panic attacks  . Opioid dependence ([place])   . PTSD (post-traumatic stress disorder)   . Seizures ([place])    alcohol induced- pt reports sober for 6 months    Past Surgical History:  Procedure Laterality Date  . ABDOMINAL SURGERY    . APPENDECTOMY    . BACK SURGERY     Family History:  Family History  Problem Relation Age of Onset  . Hypertension Mother   . CAD Father   . COPD Father   . Stroke Father   . Testicular cancer Brother    Family Psychiatric  History: See admission H&P Social History:  Social History   Substance and Sexual Activity  Alcohol Use Not Currently   Comment: Pt reports sober for 6 months  Social History   Substance and Sexual Activity  Drug Use Yes  . Types: Cocaine, Methamphetamines, Marijuana, IV   Comment: Pt says not sure why positive for cocaine    Social History   Socioeconomic History  . Marital status: Married    Spouse name: Not on file  . Number of children: Not on file  . Years of education: Not on file  . Highest education level: Not on file  Occupational History  . Not on file  Social Needs  . Financial resource strain: Not on file  .  Food insecurity:    Worry: Not on file    Inability: Not on file  . Transportation needs:    Medical: Not on file    Non-medical: Not on file  Tobacco Use  . Smoking status: Current Every Day Smoker    Packs/day: 1.50    Types: Cigarettes  . Smokeless tobacco: Never Used  Substance and Sexual Activity  . Alcohol use: Not Currently    Comment: Pt reports sober for 6 months  . Drug use: Yes    Types: Cocaine, Methamphetamines, Marijuana, IV    Comment: Pt says not sure why positive for cocaine  . Sexual activity: Yes    Birth control/protection: None    Comment: heroin  Lifestyle  . Physical activity:    Days per week: Not on file    Minutes per session: Not on file  . Stress: Not on file  Relationships  . Social connections:    Talks on phone: Not on file    Gets together: Not on file    Attends religious service: Not on file    Active member of club or organization: Not on file    Attends meetings of clubs or organizations: Not on file    Relationship status: Not on file  Other Topics Concern  . Not on file  Social History Narrative  . Not on file   Additional Social History:                         Sleep: Fair  Appetite:  Fair  Current Medications: Current Facility-Administered Medications  Medication Dose Route Frequency Provider Last Rate Last Dose  . acetaminophen (TYLENOL) tablet 650 mg  650 mg Oral Q6H PRN Elmarie Shiley A, NP   650 mg at 03/07/18 1327  . albuterol (PROVENTIL HFA;VENTOLIN HFA) 108 (90 Base) MCG/ACT inhaler 2 puff  2 puff Inhalation Q6H PRN Sharma Covert, MD   2 puff at 03/12/18 0745  . busPIRone (BUSPAR) tablet 15 mg  15 mg Oral TID Sharma Covert, MD   15 mg at 03/13/18 0803  . doxepin (SINEQUAN) capsule 50 mg  50 mg Oral QHS Sharma Covert, MD   50 mg at 03/12/18 2126  . famotidine (PEPCID) tablet 20 mg  20 mg Oral BID Sharma Covert, MD   20 mg at 03/13/18 0803  . hydrOXYzine (ATARAX/VISTARIL) tablet 25 mg  25 mg  Oral Q6H PRN Niel Hummer, NP   25 mg at 03/12/18 2126  . magnesium hydroxide (MILK OF MAGNESIA) suspension 30 mL  30 mL Oral Daily PRN Niel Hummer, NP      . methadone (DOLOPHINE) tablet 75 mg  75 mg Oral Daily Lindon Romp A, NP   75 mg at 03/13/18 0804  . methocarbamol (ROBAXIN) tablet 500 mg  500 mg Oral Q6H PRN Sharma Covert, MD   500 mg  at 03/12/18 1723  . multivitamin with minerals tablet 1 tablet  1 tablet Oral Daily Lindon Romp A, NP   1 tablet at 03/13/18 0803  . nicotine (NICODERM CQ - dosed in mg/24 hours) patch 21 mg  21 mg Transdermal Daily Lindon Romp A, NP   21 mg at 03/13/18 0804  . pantoprazole (PROTONIX) EC tablet 40 mg  40 mg Oral Daily Sharma Covert, MD   40 mg at 03/13/18 0803  . sertraline (ZOLOFT) tablet 100 mg  100 mg Oral Daily Sharma Covert, MD   100 mg at 03/13/18 0803  . thiamine (VITAMIN B-1) tablet 100 mg  100 mg Oral Daily Lindon Romp A, NP   100 mg at 03/13/18 0803    Lab Results:  Results for orders placed or performed during the hospital encounter of 03/06/18 (from the past 48 hour(s))  Basic metabolic panel     Status: Abnormal   Collection Time: 03/12/18  6:14 AM  Result Value Ref Range   Sodium 142 135 - 145 mmol/L   Potassium 3.8 3.5 - 5.1 mmol/L   Chloride 106 98 - 111 mmol/L   CO2 27 22 - 32 mmol/L   Glucose, Bld 98 70 - 99 mg/dL   BUN 11 6 - 20 mg/dL   Creatinine, Ser 0.62 0.61 - 1.24 mg/dL   Calcium 8.7 (L) 8.9 - 10.3 mg/dL   GFR calc non Af Amer >60 >60 mL/min   GFR calc Af Amer >60 >60 mL/min    Comment: (NOTE) The eGFR has been calculated using the CKD EPI equation. This calculation has not been validated in all clinical situations. eGFR's persistently <60 mL/min signify possible Chronic Kidney Disease.    Anion gap 9 5 - 15    Comment: Performed at [facility], [place] [address], [place], [place] [address]  Magnesium     Status: None   Collection Time: 03/12/18  6:14 AM  Result Value Ref Range    Magnesium 2.0 1.7 - 2.4 mg/dL    Comment: Performed at [facility], [place] [address] [place], [place] [address]  Hepatic function panel     Status: Abnormal   Collection Time: 03/13/18  6:50 AM  Result Value Ref Range   Total Protein 6.7 6.5 - 8.1 g/dL   Albumin 3.5 3.5 - 5.0 g/dL   AST 332 (H) 15 - 41 U/L   ALT 349 (H) 0 - 44 U/L   Alkaline Phosphatase 131 (H) 38 - 126 U/L   Total Bilirubin 1.1 0.3 - 1.2 mg/dL   Bilirubin, Direct 0.3 (H) 0.0 - 0.2 mg/dL   Indirect Bilirubin 0.8 0.3 - 0.9 mg/dL    Comment: Performed at [facility], [place] [address], [place], [place] [address]  Ammonia     Status: Abnormal   Collection Time: 03/13/18  6:50 AM  Result Value Ref Range   Ammonia 48 (H) 9 - 35 umol/L    Comment: Performed at [facility], [place] [address] [place], [place] [address]    Blood Alcohol level:  Lab Results  Component Value Date   ETH <10 03/05/2018   ETH <10 62/10/5595    Metabolic Disorder Labs: No results found for: HGBA1C, MPG No results found for: PROLACTIN No results found for: CHOL, TRIG, HDL, CHOLHDL, VLDL, LDLCALC  Physical Findings: AIMS: Facial and Oral Movements Muscles of Facial Expression: None, normal Lips and Perioral Area: None, normal Jaw: None, normal Tongue:  None, normal,Extremity Movements Upper (arms, wrists, hands, fingers): None, normal Lower (legs, knees, ankles, toes): None, normal, Trunk Movements Neck, shoulders, hips: None, normal, Overall Severity Severity of abnormal movements (highest score from questions above): None, normal Incapacitation due to abnormal movements: None, normal Patient's awareness of abnormal movements (rate only patient's report): No Awareness, Dental Status Current problems with teeth and/or dentures?: No Does patient usually wear dentures?: No  CIWA:  CIWA-Ar Total: 2 COWS:     Musculoskeletal: Strength & Muscle Tone: within normal limits Gait &  Station: normal Patient leans: N/A  Psychiatric Specialty Exam: Physical Exam  Nursing note and vitals reviewed. Constitutional: He is oriented to person, place, and time. He appears well-developed and well-nourished.  HENT:  Head: Normocephalic and atraumatic.  Respiratory: Effort normal.  Neurological: He is alert and oriented to person, place, and time.    ROS  Blood pressure 124/82, pulse 61, temperature 98 F (36.7 C), temperature source Oral, resp. rate (!) 24, height 6' (1.829 m), weight 120.2 kg (265 lb), SpO2 98 %.Body mass index is 35.94 kg/m.  General Appearance: Casual  Eye Contact:  Good  Speech:  Normal Rate  Volume:  Normal  Mood:  Anxious  Affect:  Congruent  Thought Process:  Coherent  Orientation:  Full (Time, Place, and Person)  Thought Content:  Logical  Suicidal Thoughts:  No  Homicidal Thoughts:  No  Memory:  Immediate;   Fair Recent;   Fair Remote;   Fair  Judgement:  Intact  Insight:  Fair  Psychomotor Activity:  Increased  Concentration:  Concentration: Fair and Attention Span: Fair  Recall:  [employer] of Knowledge:  Fair  Language:  Fair  Akathisia:  Negative  Handed:  Right  AIMS (if indicated):     Assets:  Communication Skills Desire for Improvement Resilience Talents/Skills  ADL's:  Intact  Cognition:  WNL  Sleep:  Number of Hours: 6     Treatment Plan Summary: Daily contact with patient to assess and evaluate symptoms and progress in treatment, Medication management and Plan : Patient is seen and examined.  Patient is a 47 year old male with the above-stated past psychiatric history seen in follow-up.  From a psychiatric perspective he is improving.  His suicidality has resolved.  He is not showing any signs or symptoms of withdrawal.  Unfortunately from a medical standpoint his liver function enzymes continue to climb.  I had hoped that after the alcohol is clear to system that they begin to drop, but so far we have not been met  likely.  I think that most probably the biggest medication issue that might be a problem is the methadone.  He is more than willing to reduce his dosage, so, taking down 10 mg a 65 mg p.o. daily.  I am going to ask for either the hospitalist service or the gastroenterology service to come take a look at him.  Given his hepatitis C at think this is justified.  They may be able to recognize medications causing problems with his liver than I am not seeing.  In the meantime I am going to order a liver ultrasound.  We will get that ordered today.  I am not going to change any of his psychiatric medicines today besides what is already been outlined.  Hopefully the liver function enzymes will begin to drop on their own without intervention.  Sharma Covert, MD 03/13/2018, 11:20 AM

## 2018-03-13 NOTE — Progress Notes (Signed)
Writer contacted MRI to scheduled an appt. No availability today. RN will need to call MRI at 0730 am on 03/14/18 and ask for Kendal HymenBonnie, to schedule an appt. Pt will need to be NPO 4-6 hours prior to procedure. Writer notified Dr. Jola Babinskilary and NPO order placed, effective midnight, 03/14/18.

## 2018-03-14 ENCOUNTER — Inpatient Hospital Stay (HOSPITAL_COMMUNITY): Payer: PPO

## 2018-03-14 DIAGNOSIS — F419 Anxiety disorder, unspecified: Secondary | ICD-10-CM

## 2018-03-14 DIAGNOSIS — F1024 Alcohol dependence with alcohol-induced mood disorder: Secondary | ICD-10-CM

## 2018-03-14 DIAGNOSIS — G4700 Insomnia, unspecified: Secondary | ICD-10-CM

## 2018-03-14 LAB — COMPREHENSIVE METABOLIC PANEL
ALT: 346 U/L — ABNORMAL HIGH (ref 0–44)
AST: 315 U/L — ABNORMAL HIGH (ref 15–41)
Albumin: 3.4 g/dL — ABNORMAL LOW (ref 3.5–5.0)
Alkaline Phosphatase: 137 U/L — ABNORMAL HIGH (ref 38–126)
Anion gap: 8 (ref 5–15)
BUN: 14 mg/dL (ref 6–20)
CO2: 27 mmol/L (ref 22–32)
Calcium: 8.8 mg/dL — ABNORMAL LOW (ref 8.9–10.3)
Chloride: 106 mmol/L (ref 98–111)
Creatinine, Ser: 0.69 mg/dL (ref 0.61–1.24)
GFR calc Af Amer: >60 mL/min (ref >60)
GFR calc non Af Amer: >60 mL/min (ref >60)
Glucose, Bld: 99 mg/dL (ref 70–99)
Potassium: 3.6 mmol/L (ref 3.5–5.1)
Sodium: 141 mmol/L (ref 135–145)
Total Bilirubin: 0.6 mg/dL (ref 0.3–1.2)
Total Protein: 6.4 g/dL — ABNORMAL LOW (ref 6.5–8.1)

## 2018-03-14 LAB — IRON AND TIBC
Iron: 118 ug/dL (ref 45–182)
Saturation Ratios: 26 % (ref 17.9–39.5)
TIBC: 450 ug/dL (ref 250–450)
UIBC: 332 ug/dL

## 2018-03-14 LAB — CBC
HCT: 43.3 % (ref 39.0–52.0)
Hemoglobin: 15.2 g/dL (ref 13.0–17.0)
MCH: 31.4 pg (ref 26.0–34.0)
MCHC: 35.1 g/dL (ref 30.0–36.0)
MCV: 89.5 fL (ref 78.0–100.0)
Platelets: 145 10*3/uL — ABNORMAL LOW (ref 150–400)
RBC: 4.84 MIL/uL (ref 4.22–5.81)
RDW: 13.5 % (ref 11.5–15.5)
WBC: 5.4 10*3/uL (ref 4.0–10.5)

## 2018-03-14 LAB — PROTIME-INR
INR: 0.99
Prothrombin Time: 13 seconds (ref 11.4–15.2)

## 2018-03-14 LAB — FERRITIN: Ferritin: 284 ng/mL (ref 24–336)

## 2018-03-14 LAB — TSH: TSH: 2.187 u[IU]/mL (ref 0.350–4.500)

## 2018-03-14 MED ORDER — GADOBENATE DIMEGLUMINE 529 MG/ML IV SOLN
20.0000 mL | Freq: Once | INTRAVENOUS | Status: AC | PRN
  Administered 2018-03-14: 20 mL via INTRAVENOUS
  Filled 2018-03-14: qty 20

## 2018-03-14 NOTE — Plan of Care (Signed)
  Problem: Education: Goal: Knowledge of [facility] Education information/materials will improve Outcome: Progressing   Problem: Education: Goal: Emotional status will improve Outcome: Progressing   Problem: Education: Goal: Mental status will improve Outcome: Progressing   Problem: Education: Goal: Verbalization of understanding the information provided will improve Outcome: Progressing   Problem: Activity: Goal: Interest or engagement in activities will improve Outcome: Progressing   

## 2018-03-14 NOTE — B[facility] Group Notes (Signed)
LCSW Group Therapy Note 03/14/2018 3:08 PM  Type of Therapy/Topic: Group Therapy: Feelings about Diagnosis  Participation Level: None   Description of Group:  This group will allow patients to explore their thoughts and feelings about diagnoses they have received. Patients will be guided to explore their level of understanding and acceptance of these diagnoses. Facilitator will encourage patients to process their thoughts and feelings about the reactions of others to their diagnosis and will guide patients in identifying ways to discuss their diagnosis with significant others in their lives. This group will be process-oriented, with patients participating in exploration of their own experiences, giving and receiving support, and processing challenge from other group members.  Therapeutic Goals: 1. Patient will demonstrate understanding of diagnosis as evidenced by identifying two or more symptoms of the disorder 2. Patient will be able to express two feelings regarding the diagnosis 3. Patient will demonstrate their ability to communicate their needs through discussion and/or role play  Summary of Patient Progress:  Patient was in attendance, however was excused from group by MHT due to treatment.     Therapeutic Modalities:  Cognitive Behavioral Therapy Brief Therapy Feelings Identification    Yanelli Zapanta Catalina AntiguaWilliams LCSWA Clinical Social Worker

## 2018-03-14 NOTE — Progress Notes (Signed)
Patient self inventory- Patient slept fair last night, sleep medication was not requested. Appetite has been fair, energy level normal, concentration poor. Depression rated 6/10, hopelessness 4/10, and anxiety 4/10. Denies SI HI AVH. Endorses headaches and pain rated 7/10 in lower lumbar and right leg. Patient's goal is to work on depression and hopelessness.  Patient is compliant with medications prescribed per provider. No questions or concerns.  Safety maintained with 15 minute checks. Will continue to monitor.   Scott [name] has been called for patient to arrive by 1:00. [facility]

## 2018-03-14 NOTE — Progress Notes (Signed)
Brief Narrative 47 yo with h/o fatty Liver, Hep C and Etoh Abuse , history of polysubstance abuse including IV drugs, currently on methadone maintenance therapy admitted to behavioral health on 02/18/2018 for stabilization of depression and suicidal concerns, hospitalist consult requested for elevated LFTs, please see full consult note from Dr. Lowell GuitarPowell dated 03/13/2018  Review of records suggest that the patient's current LFT levels are no different from his previous baseline, please note the patient's ALT was 413 on June 06, 2017, ALT was 360 on Dec 20, 2017.  Of note bilirubin is currently not elevated , ammonia is 48  Abdominal ultrasound from 03/13/2018 noted   MRCP from 03/14/2018 noted --MRCP IMPRESSION: Mild biliary ductal dilatation, without radiographic evidence of choledocholithiasis or other obstructing etiology. Mild hepatic steatosis.  No evidence of hepatic neoplasm.  Acute Hepatitis panel is pending at this time if patient Hep B negative and patient has no immunity against hep B, consider immunization/vaccination for hep B.  Hep C viral load pending  TSH, INR , ferritin serum iron and TIBC are   Given unremarkable MRCP from 03/14/2018 no further inpatient work-up is needed at this time  Recommendations are as follows   #1 follow-up with GI service post discharge as outpatient for further management of his chronic hep C, hep C viral load pending  #2 please consider vaccination for hep B if patient is have been negative, and not immune to hep B,  hep B surface antigen is pending at this time  #3 abstinence from alcohol, avoidance of excessive acetaminophen exposure, avoidance of other hepatotoxic agents strongly encouraged  Further management per psychiatric team,   Hospitalist service will sign off at this time, please recall us as needed  Shon Haleourage Andreanna Mikolajczak, MD
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
## 2018-03-14 NOTE — Progress Notes (Signed)
Adult Psychoeducational Group Note  Date:  03/14/2018 Time:  9:10 PM  Group Topic/Focus:  Wrap-Up Group:   The focus of this group is to help patients review their daily goal of treatment and discuss progress on daily workbooks.  Participation Level:  Active  Participation Quality:  Appropriate  Affect:  Appropriate  Cognitive:  Alert  Insight: Appropriate  Engagement in Group:  Engaged  Modes of Intervention:  Discussion  Additional Comments:  Patient stated having an alright day. Patient's goal for today was to work on his depression and to think positive. Patient met his goal.   Calyx Hawker L Neeko Pharo 03/14/2018, 9:10 PM

## 2018-03-14 NOTE — Progress Notes (Signed)
[facility] MD Progress Note  03/14/2018 10:42 AM Scott [name]  MRN:  [id number] Subjective: patient states " I feel better". Currently denies suicidal ideations .  Objective: I have discussed case with treatment team and have met with patient. 47 year old male, known to [facility] from prior admissions, history of depression, history of alcohol dependence. Admitted due to worsening depression, suicidal ideations with thoughts of overdosing. Had been drinking daily, heavily prior to admission. At this time patient reports partial improvement . States he feels better than he did prior to admission, but still vaguely depressed, anxious. Denies suicidal ideations.  Does not endorse significant symptoms of alcohol WDL and does not appear to be in any acute distress or discomfort at this time. No distal tremors or diaphoresis noted. Patient has been noted to have moderately elevated LFTs, and has history of Hep C . Labs reviewed- AST 315, ALT 346- elevated, but stable compared to prior. Recent Ultrasound reported as positive for hepatic steatosis.  Patient has been visible on unit, calm, cooperative on approach, no disruptive or agitated behaviors. Currently denies suicidal ideations, presents future oriented, states he plans to return home at discharge and follow up with [facility] . Denies medication side effects.  Principal Problem:  MDD, Alcohol Dependence, Alcohol Induced Mood Disorder Diagnosis:   Patient Active Problem List   Diagnosis Date Noted  . Elevated liver enzymes [R74.8] 03/13/2018  . MDD (major depressive disorder), recurrent severe, without psychosis ([place]) [F33.2] 03/06/2018  . MDD (major depressive disorder), severe ([place]) [F32.2] 12/16/2017  . Schizoaffective disorder ([place]) [F25.9] 06/07/2017  . Alcohol use disorder, severe, dependence ([place]) [F10.20] 07/24/2015  . Alcohol withdrawal ([place]) [F10.239] 07/24/2015  . Cocaine use disorder, mild, abuse ([place]) [F14.10] 07/24/2015  . Substance induced mood  disorder ([place]) [F19.94] 07/22/2015  . Drug-seeking behavior [Z76.5] 02/16/2015  . PTSD (post-traumatic stress disorder) [F43.10] 12/26/2014  . Opioid type dependence, continuous ([place]) [F11.20] 12/26/2014  . Major depressive disorder, recurrent episode, moderate ([facility]) [F33.1]   . Chest pain at rest [R07.9] 10/22/2013  . Chronic pain syndrome [G89.4] 10/22/2013  . Seizure disorder ([place]) [A70.141] 10/22/2013  . Chest pain [R07.9] 10/21/2013   Total Time spent with patient: 20 minutes  Past Psychiatric History: See admission H&P  Past Medical History:  Past Medical History:  Diagnosis Date  . Accidental heroin overdose ([place])   . Anxiety   . Back pain   . Bipolar 1 disorder ([place])   . Chronic back pain   . Depression   . Drug-seeking behavior   . GERD (gastroesophageal reflux disease)   . Heroin abuse ([place])   . History of ETOH abuse   . Myocardial infarction [employer])    Pt says that the doctor determined it was panic attacks  . Opioid dependence ([place])   . PTSD (post-traumatic stress disorder)   . Seizures ([place])    alcohol induced- pt reports sober for 6 months    Past Surgical History:  Procedure Laterality Date  . ABDOMINAL SURGERY    . APPENDECTOMY    . BACK SURGERY     Family History:  Family History  Problem Relation Age of Onset  . Hypertension Mother   . CAD Father   . COPD Father   . Stroke Father   . Testicular cancer Brother    Family Psychiatric  History: See admission H&P Social History:  Social History   Substance and Sexual Activity  Alcohol Use Not Currently   Comment: Pt reports sober for 6 months  Social History   Substance and Sexual Activity  Drug Use Yes  . Types: Cocaine, Methamphetamines, Marijuana, IV   Comment: Pt says not sure why positive for cocaine    Social History   Socioeconomic History  . Marital status: Married    Spouse name: Not on file  . Number of children: Not on file  . Years of education: Not on file  . Highest  education level: Not on file  Occupational History  . Not on file  Social Needs  . Financial resource strain: Not on file  . Food insecurity:    Worry: Not on file    Inability: Not on file  . Transportation needs:    Medical: Not on file    Non-medical: Not on file  Tobacco Use  . Smoking status: Current Every Day Smoker    Packs/day: 1.50    Types: Cigarettes  . Smokeless tobacco: Never Used  Substance and Sexual Activity  . Alcohol use: Not Currently    Comment: Pt reports sober for 6 months  . Drug use: Yes    Types: Cocaine, Methamphetamines, Marijuana, IV    Comment: Pt says not sure why positive for cocaine  . Sexual activity: Yes    Birth control/protection: None    Comment: heroin  Lifestyle  . Physical activity:    Days per week: Not on file    Minutes per session: Not on file  . Stress: Not on file  Relationships  . Social connections:    Talks on phone: Not on file    Gets together: Not on file    Attends religious service: Not on file    Active member of club or organization: Not on file    Attends meetings of clubs or organizations: Not on file    Relationship status: Not on file  Other Topics Concern  . Not on file  Social History Narrative  . Not on file   Additional Social History:   Sleep: improving   Appetite:  improving   Current Medications: Current Facility-Administered Medications  Medication Dose Route Frequency Provider Last Rate Last Dose  . albuterol (PROVENTIL HFA;VENTOLIN HFA) 108 (90 Base) MCG/ACT inhaler 2 puff  2 puff Inhalation Q6H PRN Sharma Covert, MD   2 puff at 03/12/18 0745  . doxepin (SINEQUAN) capsule 50 mg  50 mg Oral QHS Sharma Covert, MD   50 mg at 03/13/18 2106  . famotidine (PEPCID) tablet 20 mg  20 mg Oral BID Sharma Covert, MD   20 mg at 03/14/18 3016  . hydrOXYzine (ATARAX/VISTARIL) tablet 25 mg  25 mg Oral Q6H PRN Niel Hummer, NP   25 mg at 03/12/18 2126  . magnesium hydroxide (MILK OF MAGNESIA)  suspension 30 mL  30 mL Oral Daily PRN Elmarie Shiley A, NP      . methadone (DOLOPHINE) tablet 65 mg  65 mg Oral Daily Sharma Covert, MD   65 mg at 03/14/18 0827  . multivitamin with minerals tablet 1 tablet  1 tablet Oral Daily Lindon Romp A, NP   1 tablet at 03/14/18 0828  . nicotine (NICODERM CQ - dosed in mg/24 hours) patch 21 mg  21 mg Transdermal Daily Lindon Romp A, NP   21 mg at 03/14/18 0829  . sertraline (ZOLOFT) tablet 100 mg  100 mg Oral Daily Sharma Covert, MD   100 mg at 03/14/18 0826  . thiamine (VITAMIN B-1) tablet 100 mg  100 mg Oral  Daily Lindon Romp A, NP   100 mg at 03/14/18 8115    Lab Results:  Results for orders placed or performed during the hospital encounter of 03/06/18 (from the past 48 hour(s))  Hepatic function panel     Status: Abnormal   Collection Time: 03/13/18  6:50 AM  Result Value Ref Range   Total Protein 6.7 6.5 - 8.1 g/dL   Albumin 3.5 3.5 - 5.0 g/dL   AST 332 (H) 15 - 41 U/L   ALT 349 (H) 0 - 44 U/L   Alkaline Phosphatase 131 (H) 38 - 126 U/L   Total Bilirubin 1.1 0.3 - 1.2 mg/dL   Bilirubin, Direct 0.3 (H) 0.0 - 0.2 mg/dL   Indirect Bilirubin 0.8 0.3 - 0.9 mg/dL    Comment: Performed at [facility], [place] [address], [place], [place] [address]  Ammonia     Status: Abnormal   Collection Time: 03/13/18  6:50 AM  Result Value Ref Range   Ammonia 48 (H) 9 - 35 umol/L    Comment: Performed at [facility], [place] [address], [place], [place] [address]  Iron and TIBC     Status: None   Collection Time: 03/14/18  6:28 AM  Result Value Ref Range   Iron 118 45 - 182 ug/dL   TIBC 450 250 - 450 ug/dL   Saturation Ratios 26 17.9 - 39.5 %   UIBC 332 ug/dL    Comment: Performed at [facility], [place] [address] [place], [place] [address]  Ferritin     Status: None   Collection Time: 03/14/18  6:28 AM  Result Value Ref Range   Ferritin 284 24 - 336 ng/mL    Comment: Performed at [facility], [place] [address], [place], [place] [address]  Comprehensive metabolic panel     Status: Abnormal   Collection Time: 03/14/18  6:28 AM  Result Value Ref Range   Sodium 141 135 - 145 mmol/L   Potassium 3.6 3.5 - 5.1 mmol/L   Chloride 106 98 - 111 mmol/L   CO2 27 22 - 32 mmol/L   Glucose, Bld 99 70 - 99 mg/dL   BUN 14 6 - 20 mg/dL   Creatinine, Ser 0.69 0.61 - 1.24 mg/dL   Calcium 8.8 (L) 8.9 - 10.3 mg/dL   Total Protein 6.4 (L) 6.5 - 8.1 g/dL   Albumin 3.4 (L) 3.5 - 5.0 g/dL   AST 315 (H) 15 - 41 U/L   ALT 346 (H) 0 - 44 U/L   Alkaline Phosphatase 137 (H) 38 - 126 U/L   Total Bilirubin 0.6 0.3 - 1.2 mg/dL   GFR calc non Af Amer >60 >60 mL/min   GFR calc Af Amer >60 >60 mL/min    Comment: (NOTE) The eGFR has been calculated using the CKD EPI equation. This calculation has not been validated in all clinical situations. eGFR's persistently <60 mL/min signify possible Chronic Kidney Disease.    Anion gap 8 5 - 15    Comment: Performed at [facility], [place] [address] [place], [place] [address]  CBC     Status: Abnormal   Collection Time: 03/14/18  6:28 AM  Result Value Ref Range   WBC 5.4 4.0 - 10.5 K/uL   RBC 4.84 4.22 - 5.81 MIL/uL   Hemoglobin 15.2 13.0 - 17.0 g/dL   HCT 43.3 39.0 - 52.0 %   MCV 89.5 78.0 - 100.0 fL  MCH 31.4 26.0 - 34.0 pg   MCHC 35.1 30.0 - 36.0 g/dL   RDW 13.5 11.5 - 15.5 %   Platelets 145 (L) 150 - 400 K/uL    Comment: Performed at [facility], [place] [address], [place], [place] [address]  Protime-INR     Status: None   Collection Time: 03/14/18  6:28 AM  Result Value Ref Range   Prothrombin Time 13.0 11.4 - 15.2 seconds   INR 0.99     Comment: Performed at [facility], [place] [address], [place], [place] [address]  TSH     Status: None   Collection Time: 03/14/18  6:28 AM  Result Value Ref Range   TSH 2.187 0.350 - 4.500 uIU/mL    Comment: Performed by a 3rd Generation  assay with a functional sensitivity of <=0.01 uIU/mL. Performed at [facility], [place] [address] [place],  [address]     Blood Alcohol level:  Lab Results  Component Value Date   ETH <10 03/05/2018   ETH <10 81/15/7262    Metabolic Disorder Labs: No results found for: HGBA1C, MPG No results found for: PROLACTIN No results found for: CHOL, TRIG, HDL, CHOLHDL, VLDL, LDLCALC  Physical Findings: AIMS: Facial and Oral Movements Muscles of Facial Expression: None, normal Lips and Perioral Area: None, normal Jaw: None, normal Tongue: None, normal,Extremity Movements Upper (arms, wrists, hands, fingers): None, normal Lower (legs, knees, ankles, toes): None, normal, Trunk Movements Neck, shoulders, hips: None, normal, Overall Severity Severity of abnormal movements (highest score from questions above): None, normal Incapacitation due to abnormal movements: None, normal Patient's awareness of abnormal movements (rate only patient's report): No Awareness, Dental Status Current problems with teeth and/or dentures?: No Does patient usually wear dentures?: No  CIWA:  CIWA-Ar Total: 2 COWS:     Musculoskeletal: Strength & Muscle Tone: within normal limits Gait & Station: normal Patient leans: N/A  Psychiatric Specialty Exam: Physical Exam  Nursing note and vitals reviewed. Constitutional: He is oriented to person, place, and time. He appears well-developed and well-nourished.  HENT:  Head: Normocephalic and atraumatic.  Respiratory: Effort normal.  Neurological: He is alert and oriented to person, place, and time.    ROS denies chest pain, no shortness of breath, no choluria or acholia, no RUQ increased pain or discomfort , no fever or chills   Blood pressure 106/74, pulse 63, temperature 98 F (36.7 C), temperature source Oral, resp. rate 18, height 6' (1.829 m), weight 120.2 kg (265 lb), SpO2 98 %.Body mass index is 35.94 kg/m.  General Appearance:  Fairly Groomed  Eye Contact:  Good  Speech:  Normal Rate  Volume:  Normal  Mood:  reports partially improved mood, states feels better  Affect:  Appropriate  Thought Process:  Linear and Descriptions of Associations: Intact  Orientation:  Full (Time, Place, and Person)  Thought Content:  no hallucinations, no delusions, not internally preoccupied   Suicidal Thoughts:  No denies suicidal or self injurious ideations, denies homicidal or violent ideations, contracts for safety on unit   Homicidal Thoughts:  No  Memory:  recent and remote grossly intact   Judgement:  Other:  improving   Insight:  improving   Psychomotor Activity:  Normal- no current psychomotor agitation or restlessness   Concentration:  Concentration: Good and Attention Span: Good  Recall:  Good  Fund of Knowledge:  Good  Language:  Good  Akathisia:  Negative  Handed:  Right  AIMS (if indicated):  Assets:  Communication Skills Desire for Improvement Resilience Talents/Skills  ADL's:  Intact  Cognition:  WNL  Sleep:  Number of Hours: 5.25   Assessment - 47 year old male, known to [facility] from [facility] admissions, history of mood disorder and alcohol dependence, admitted due to depression, suicidal ideations, alcohol abuse . Reports feeling better than on admission and endorses gradual, partial improvement in mood. Denies SI at this time and presents future oriented. LFTs elevated ( history of Hep C and Alcohol Use Disorder) but does not endorse associated symptoms at this time and does not appear to be in any acute distress or discomfort . At this time tolerating medications well- of note, patient is on Methadone maintenance, which was recently tapered down to 65 mgrs QDAY. Denies side effects or excessive sedation, presents fully alert, attentive at this time.   Treatment Plan Summary: Treatment plan reviewed as below today 8/6 Encourage group and milieu participation to work on coping skills and symptom  reduction Encourage efforts to work on sobriety and relapse prevention Treatment Team working on disposition planning options Continue Doxepin 50 mgrs QHS for insomnia, mood Continue Methadone 65 mgrs QDAY ( recently tapered from 75 mgrs daily)  for opiate dependence/maintenance  Continue Zoloft 100 mgrs QDAY for depression, anxiety Patient is scheduled for abdominal MRI today   Jenne Campus, MD 03/14/2018, 10:42 AM   Patient ID: Scott [name], male   DOB: June 17, 1971, 47 y.o.   MRN: [id number]

## 2018-03-15 ENCOUNTER — Telehealth: Payer: Self-pay | Admitting: *Deleted

## 2018-03-15 DIAGNOSIS — B182 Chronic viral hepatitis C: Secondary | ICD-10-CM

## 2018-03-15 LAB — HEPATITIS B SURFACE ANTIBODY, QUANTITATIVE: Hepatitis B-Post: 48.3 m[IU]/mL (ref >9.9)

## 2018-03-15 LAB — HEPATITIS A ANTIBODY, TOTAL: Hep A Total Ab: POSITIVE — AB

## 2018-03-15 NOTE — Telephone Encounter (Signed)
[facility]. Patient has Hep C. They would like to set him up with [facility] for follow up after discharge. Labs scheduled 8/28 9:00, follow up Dr Luciana Axeomer 9/4 at 9:00.  Orders placed. Andree CossHowell, Michelle M, RN

## 2018-03-15 NOTE — Plan of Care (Signed)
  Problem: Education: Goal: Mental status will improve Outcome: Progressing   Problem: Education: Goal: Verbalization of understanding the information provided will improve Outcome: Progressing   Problem: Activity: Goal: Interest or engagement in activities will improve Outcome: Progressing   Problem: Activity: Goal: Sleeping patterns will improve Outcome: Progressing   

## 2018-03-15 NOTE — [facility]HH Group Notes (Signed)
[facility] 03/15/2018 1:15pm  Type of Therapy: Mental Health Association Presentation  Participation Level: Active  Participation Quality: Attentive  Affect: Appropriate  Cognitive: Oriented  Insight: Developing/Improving  Engagement in Therapy: Engaged  Modes of Intervention: Discussion, Education and Socialization  Summary of Progress/Problems: Mental Health Association (MHA) Speaker came to talk about his personal journey with mental health. The pt processed ways by which to relate to the speaker. MHA speaker provided handouts and educational information pertaining to groups and services offered by the [facility]. Pt was engaged in speaker's presentation and was receptive to resources provided.    Lorri FrederickWierda, Devlynn Knoff Jon, LCSW 03/15/2018 1:24 PM

## 2018-03-15 NOTE — Progress Notes (Signed)
[facility] MD Progress Note  03/15/2018 1:07 PM KUNIO CUMMISKEY  MRN:  [id number] Subjective: Patient describes gradual improvement compared to admission, acknowledges he feels better.  Currently denies suicidal ideations.  As he improves he is focusing more on disposition planning.  Reports increased motivation for abstinence in the context of liver disease. Objective: I have discussed case with treatment team and have met with patient.  47 year old male, known to [facility] from prior admissions, history of depression, history of alcohol dependence. Admitted due to worsening depression, suicidal ideations with thoughts of overdosing. Had been drinking daily, heavily prior to admission. Patient reports feeling better than he did prior to admission and presents with improving mood and range of affect.  Denies suicidal ideations. Presents future oriented. Behavior on unit in good control, no disruptive or agitated behaviors. Appreciate hospitalist consult /follow-up.August 6 abdominal MRI reported as mild biliary dilatation without obstruction, mild hepatic steatosis, without evidence of hepatic neoplasm.  Have reviewed at length negative impact that alcohol is likely having on physical health and hepatic disease/elevated transaminases.  As noted, patient recently diagnosed with hepatitis C. We have reviewed importance of alcohol abstinence as an integral part of treatment not only for psychiatric management but for GI /hepatic management as well .  Have also reviewed availability of medications to address Hep C and importance of regular outpatient management with gastroenterologist and/or ID. Patient expresses understanding. We discussed disposition planning, at this time his plan is to return home at discharge.  He plans to continue methadone maintenance with [facility].  He has been recommended to consider ACT team referral as well . Denies medication side effects.  Principal Problem:  MDD, Alcohol Dependence, Alcohol  Induced Mood Disorder Diagnosis:   Patient Active Problem List   Diagnosis Date Noted  . Elevated liver enzymes [R74.8] 03/13/2018  . MDD (major depressive disorder), recurrent severe, without psychosis ([place]) [F33.2] 03/06/2018  . MDD (major depressive disorder), severe ([place]) [F32.2] 12/16/2017  . Schizoaffective disorder ([place]) [F25.9] 06/07/2017  . Alcohol use disorder, severe, dependence ([place]) [F10.20] 07/24/2015  . Alcohol withdrawal ([place]) [F10.239] 07/24/2015  . Cocaine use disorder, mild, abuse ([place]) [F14.10] 07/24/2015  . Substance induced mood disorder ([place]) [F19.94] 07/22/2015  . Drug-seeking behavior [Z76.5] 02/16/2015  . PTSD (post-traumatic stress disorder) [F43.10] 12/26/2014  . Opioid type dependence, continuous ([place]) [F11.20] 12/26/2014  . Major depressive disorder, recurrent episode, moderate (HCC) [F33.1]   . Chest pain at rest [R07.9] 10/22/2013  . Chronic pain syndrome [G89.4] 10/22/2013  . Seizure disorder ([place]) [M35.361] 10/22/2013  . Chest pain [R07.9] 10/21/2013   Total Time spent with patient: 20 minutes  Past Psychiatric History: See admission H&P  Past Medical History:  Past Medical History:  Diagnosis Date  . Accidental heroin overdose ([place])   . Anxiety   . Back pain   . Bipolar 1 disorder ([place])   . Chronic back pain   . Depression   . Drug-seeking behavior   . GERD (gastroesophageal reflux disease)   . Heroin abuse ([place])   . History of ETOH abuse   . Myocardial infarction [facility])    Pt says that the doctor determined it was panic attacks  . Opioid dependence ([place])   . PTSD (post-traumatic stress disorder)   . Seizures ([place])    alcohol induced- pt reports sober for 6 months    Past Surgical History:  Procedure Laterality Date  . ABDOMINAL SURGERY    . APPENDECTOMY    . BACK SURGERY  Family History:  Family History  Problem Relation Age of Onset  . Hypertension Mother   . CAD Father   . COPD Father   . Stroke Father   . Testicular  cancer Brother    Family Psychiatric  History: See admission H&P Social History:  Social History   Substance and Sexual Activity  Alcohol Use Not Currently   Comment: Pt reports sober for 6 months     Social History   Substance and Sexual Activity  Drug Use Yes  . Types: Cocaine, Methamphetamines, Marijuana, IV   Comment: Pt says not sure why positive for cocaine    Social History   Socioeconomic History  . Marital status: Married    Spouse name: Not on file  . Number of children: Not on file  . Years of education: Not on file  . Highest education level: Not on file  Occupational History  . Not on file  Social Needs  . Financial resource strain: Not on file  . Food insecurity:    Worry: Not on file    Inability: Not on file  . Transportation needs:    Medical: Not on file    Non-medical: Not on file  Tobacco Use  . Smoking status: Current Every Day Smoker    Packs/day: 1.50    Types: Cigarettes  . Smokeless tobacco: Never Used  Substance and Sexual Activity  . Alcohol use: Not Currently    Comment: Pt reports sober for 6 months  . Drug use: Yes    Types: Cocaine, Methamphetamines, Marijuana, IV    Comment: Pt says not sure why positive for cocaine  . Sexual activity: Yes    Birth control/protection: None    Comment: heroin  Lifestyle  . Physical activity:    Days per week: Not on file    Minutes per session: Not on file  . Stress: Not on file  Relationships  . Social connections:    Talks on phone: Not on file    Gets together: Not on file    Attends religious service: Not on file    Active member of club or organization: Not on file    Attends meetings of clubs or organizations: Not on file    Relationship status: Not on file  Other Topics Concern  . Not on file  Social History Narrative  . Not on file   Additional Social History:   Sleep: improving   Appetite:  improving   Current Medications: Current Facility-Administered Medications   Medication Dose Route Frequency Provider Last Rate Last Dose  . albuterol (PROVENTIL HFA;VENTOLIN HFA) 108 (90 Base) MCG/ACT inhaler 2 puff  2 puff Inhalation Q6H PRN Sharma Covert, MD   2 puff at 03/12/18 0745  . doxepin (SINEQUAN) capsule 50 mg  50 mg Oral QHS Sharma Covert, MD   50 mg at 03/14/18 2131  . famotidine (PEPCID) tablet 20 mg  20 mg Oral BID Sharma Covert, MD   20 mg at 03/15/18 0750  . hydrOXYzine (ATARAX/VISTARIL) tablet 25 mg  25 mg Oral Q6H PRN Niel Hummer, NP   25 mg at 03/14/18 2133  . magnesium hydroxide (MILK OF MAGNESIA) suspension 30 mL  30 mL Oral Daily PRN Elmarie Shiley A, NP      . methadone (DOLOPHINE) tablet 65 mg  65 mg Oral Daily Sharma Covert, MD   65 mg at 03/15/18 0750  . multivitamin with minerals tablet 1 tablet  1 tablet Oral Daily  Rozetta Nunnery, NP   1 tablet at 03/15/18 0750  . nicotine (NICODERM CQ - dosed in mg/24 hours) patch 21 mg  21 mg Transdermal Daily Lindon Romp A, NP   21 mg at 03/15/18 0753  . sertraline (ZOLOFT) tablet 100 mg  100 mg Oral Daily Sharma Covert, MD   100 mg at 03/15/18 0750  . thiamine (VITAMIN B-1) tablet 100 mg  100 mg Oral Daily Lindon Romp A, NP   100 mg at 03/15/18 8182    Lab Results:  Results for orders placed or performed during the hospital encounter of 03/06/18 (from the past 48 hour(s))  Hepatitis A antibody, total     Status: Abnormal   Collection Time: 03/14/18  6:28 AM  Result Value Ref Range   Hep A Total Ab Positive (A) Negative    Comment: (NOTE) Performed At: [facility] [place], [place] [id number] Rush Farmer MD EL:[id number]   Hepatitis B surface antibody     Status: None   Collection Time: 03/14/18  6:28 AM  Result Value Ref Range   Hepatitis B-Post 48.3 Immunity>9.9 mIU/mL    Comment: (NOTE)  Status of Immunity                     Anti-HBs Level  ------------------                     -------------- Inconsistent with Immunity                    0.0 - 9.9 Consistent with Immunity                          >9.9 Performed At: [facility] [place], [place] [id number] Rush Farmer MD [id number]   Iron and TIBC     Status: None   Collection Time: 03/14/18  6:28 AM  Result Value Ref Range   Iron 118 45 - 182 ug/dL   TIBC 450 250 - 450 ug/dL   Saturation Ratios 26 17.9 - 39.5 %   UIBC 332 ug/dL    Comment: Performed at [facility], [place] [address] [place], [place] [address]  Ferritin     Status: None   Collection Time: 03/14/18  6:28 AM  Result Value Ref Range   Ferritin 284 24 - 336 ng/mL    Comment: Performed at [facility], [place] [address], [place], [place] [address]  Comprehensive metabolic panel     Status: Abnormal   Collection Time: 03/14/18  6:28 AM  Result Value Ref Range   Sodium 141 135 - 145 mmol/L   Potassium 3.6 3.5 - 5.1 mmol/L   Chloride 106 98 - 111 mmol/L   CO2 27 22 - 32 mmol/L   Glucose, Bld 99 70 - 99 mg/dL   BUN 14 6 - 20 mg/dL   Creatinine, Ser 0.69 0.61 - 1.24 mg/dL   Calcium 8.8 (L) 8.9 - 10.3 mg/dL   Total Protein 6.4 (L) 6.5 - 8.1 g/dL   Albumin 3.4 (L) 3.5 - 5.0 g/dL   AST 315 (H) 15 - 41 U/L   ALT 346 (H) 0 - 44 U/L   Alkaline Phosphatase 137 (H) 38 - 126 U/L   Total Bilirubin 0.6 0.3 - 1.2 mg/dL   GFR calc non Af Amer >60 >60 mL/min   GFR calc Af Amer >60 >60 mL/min  Comment: (NOTE) The eGFR has been calculated using the CKD EPI equation. This calculation has not been validated in all clinical situations. eGFR's persistently <60 mL/min signify possible Chronic Kidney Disease.    Anion gap 8 5 - 15    Comment: Performed at [facility], [place] [address], [place], [place] [address]  CBC     Status: Abnormal   Collection Time: 03/14/18  6:28 AM  Result Value Ref Range   WBC 5.4 4.0 - 10.5 K/uL   RBC 4.84 4.22 - 5.81 MIL/uL   Hemoglobin 15.2 13.0 - 17.0 g/dL   HCT 43.3 39.0 - 52.0 %   MCV 89.5 78.0 -  100.0 fL   MCH 31.4 26.0 - 34.0 pg   MCHC 35.1 30.0 - 36.0 g/dL   RDW 13.5 11.5 - 15.5 %   Platelets 145 (L) 150 - 400 K/uL    Comment: Performed at [facility], [place] [address] [place], [place] [address]  Protime-INR     Status: None   Collection Time: 03/14/18  6:28 AM  Result Value Ref Range   Prothrombin Time 13.0 11.4 - 15.2 seconds   INR 0.99     Comment: Performed at [facility], [place] [address], [place], [place] [address]  TSH     Status: None   Collection Time: 03/14/18  6:28 AM  Result Value Ref Range   TSH 2.187 0.350 - 4.500 uIU/mL    Comment: Performed by a 3rd Generation assay with a functional sensitivity of <=0.01 uIU/mL. Performed at [facility], [place] [address], [place], [place] [address]     Blood Alcohol level:  Lab Results  Component Value Date   ETH <10 03/05/2018   ETH <10 65/79/0383    Metabolic Disorder Labs: No results found for: HGBA1C, MPG No results found for: PROLACTIN No results found for: CHOL, TRIG, HDL, CHOLHDL, VLDL, LDLCALC  Physical Findings: AIMS: Facial and Oral Movements Muscles of Facial Expression: None, normal Lips and Perioral Area: None, normal Jaw: None, normal Tongue: None, normal,Extremity Movements Upper (arms, wrists, hands, fingers): None, normal Lower (legs, knees, ankles, toes): None, normal, Trunk Movements Neck, shoulders, hips: None, normal, Overall Severity Severity of abnormal movements (highest score from questions above): None, normal Incapacitation due to abnormal movements: None, normal Patient's awareness of abnormal movements (rate only patient's report): No Awareness, Dental Status Current problems with teeth and/or dentures?: No Does patient usually wear dentures?: No  CIWA:  CIWA-Ar Total: 2 COWS:     Musculoskeletal: Strength & Muscle Tone: within normal limits Gait & Station: normal Patient leans: N/A  Psychiatric Specialty  Exam: Physical Exam  Nursing note and vitals reviewed. Constitutional: He is oriented to person, place, and time. He appears well-developed and well-nourished.  HENT:  Head: Normocephalic and atraumatic.  Respiratory: Effort normal.  Neurological: He is alert and oriented to person, place, and time.    ROS denies chest pain, no shortness of breath, no choluria or acholia, no RUQ increased pain or discomfort , no fever or chills   Blood pressure 129/72, pulse 65, temperature 98 F (36.7 C), temperature source Oral, resp. rate 18, height 6' (1.829 m), weight 120.2 kg (265 lb), SpO2 98 %.Body mass index is 35.94 kg/m.  General Appearance: Improving grooming  Eye Contact:  Good  Speech:  Normal Rate  Volume:  Normal  Mood:  Reports improving mood  Affect:  Appropriate, smiles at times appropriately  Thought Process:  Linear  and Descriptions of Associations: Intact  Orientation:  Full (Time, Place, and Person)  Thought Content:  no hallucinations, no delusions, not internally preoccupied   Suicidal Thoughts:  No denies suicidal or self injurious ideations, denies homicidal or violent ideations, contracts for safety on unit   Homicidal Thoughts:  No  Memory:  recent and remote grossly intact   Judgement:  Other:  improving   Insight:  improving   Psychomotor Activity:  Normal- no current psychomotor agitation or restlessness   Concentration:  Concentration: Good and Attention Span: Good  Recall:  Good  Fund of Knowledge:  Good  Language:  Good  Akathisia:  Negative  Handed:  Right  AIMS (if indicated):     Assets:  Communication Skills Desire for Improvement Resilience Talents/Skills  ADL's:  Intact  Cognition:  WNL  Sleep:  Number of Hours: 6.75   Assessment - 47 year old male, known to [facility] from prior admissions, history of mood disorder and alcohol dependence, admitted due to depression, suicidal ideations, alcohol abuse.  Currently presenting with gradually improving mood and  range of affect.  As he improves he is focusing more on discharge planning-at present wants to return home and continue outpatient treatment to include outpatient management/monitoring of liver function/hep C.  Currently tolerating medications well, denies side effects.  No suicidal ideations at present, presents future oriented.  Treatment Plan Summary: Treatment plan reviewed as below today 8/7 Encourage group and milieu participation to work on coping skills and symptom reduction Encourage efforts to work on sobriety and relapse prevention Treatment Team working on disposition planning options Continue Doxepin 50 mgrs QHS for insomnia, mood Continue Methadone 65 mgrs QDAY ( recently tapered from 75 mgrs daily)  for opiate dependence/maintenance  Continue Zoloft 100 mgrs QDAY for depression, anxiety Patient encouraged to follow-up with gastroenterology/infectious disease for ongoing monitoring and management of liver disease/hep C.    Jenne Campus, MD 03/15/2018, 1:07 PM   Patient ID: Dorisann Frames, male   DOB: 12-26-1970, 47 y.o.   MRN: [id number]

## 2018-03-15 NOTE — Progress Notes (Signed)
Pt reported to this writer that he had a fair day.  He denies SI/HI/AVH this evening.  He voiced no needs or concerns this evening.  He is aware of the meds he is scheduled for bedtime and requested a prn dose of Vistaril 25 mg which he was given.  Pt makes his needs known to staff.  He anticipates discharge soon.  Pt has been pleasant and cooperative.  Support and encouragement offered.  Discharge plans are in process.  Safety maintained with q15 minute checks.

## 2018-03-15 NOTE — [facility] Suicide Risk Assessment (Signed)
[facility] INPATIENT:  Family/Significant Other Suicide Prevention Education  Suicide Prevention Education:  Education Completed; Scott HumphreyKatherine Keltner, wife, [phone], has been identified by the patient as the family member/significant other with whom the patient will be residing, and identified as the person(s) who will aid the patient in the event of a mental health crisis (suicidal ideations/suicide attempt).  With written consent from the patient, the family member/significant other has been provided the following suicide prevention education, prior to the and/or following the discharge of the patient.  The suicide prevention education provided includes the following:  Suicide risk factors  Suicide prevention and interventions  National Suicide Hotline telephone number  [facility] assessment telephone number  [facility] Emergency Assistance 911  [facility] and/or [facility]idential Mobile Crisis Unit telephone number  Request made of family/significant other to:  Remove weapons (e.g., guns, rifles, knives), all items previously/currently identified as safety concern.  No guns in the home, per wife.  Remove drugs/medications (over-the-counter, prescriptions, illicit drugs), all items previously/currently identified as a safety concern.  The family member/significant other verbalizes understanding of the suicide prevention education information provided.  The family member/significant other agrees to remove the items of safety concern listed above.  Natalia LeatherwoodKatherine reports pt has ongoing problems with "addiction and suicide." After he leaves the hospital he is "OK for a few days" and then it starts over again.  He misses days at [facility], she does not think he is maintaining sobriety.  She would be open to possible ACT team if that is an option--his insurance is a medicare plan-[employer].  Lorri FrederickWierda, Denman Pichardo Jon, LCSW 03/15/2018, 8:26 AM

## 2018-03-15 NOTE — Progress Notes (Signed)
Adult Psychoeducational Group Note  Date:  03/15/2018 Time:  9:19 PM  Group Topic/Focus:  Wrap-Up Group:   The focus of this group is to help patients review their daily goal of treatment and discuss progress on daily workbooks.  Participation Level:  Active  Participation Quality:  Appropriate  Affect:  Appropriate  Cognitive:  Alert  Insight: Appropriate  Engagement in Group:  Engaged  Modes of Intervention:  Discussion and Education  Additional Comments:    Pt participated in group. Pt said he had a good day, although he was not feeling well. Pt said he has been confused and nauseous. Pt did speak to the doctor about what was going on.    Karren CobbleFizah G Brighten Buzzelli 03/15/2018, 9:19 PM

## 2018-03-15 NOTE — Progress Notes (Signed)
Patient self inventory- Patient slept well last night, sleep medication was not requested. Appetite has been fair, energy level normal, concentration poor. Depression, hopelessness, and anxiety rated 6, 5, and 4. Endorses physical problems such as lightheadedness, dizziness, and headaches. Patient endorses pain rated 7 out of 10 in his lower lumbar and right leg. This is chronic. Patient's goal is to work on depression by going to groups.  Patient is compliant with medications prescribed per provider. Safety is being maintained with 15 minute checks as well as environmental checks. Will continue to monitor.

## 2018-03-15 NOTE — Therapy (Signed)
Occupational Therapy Group Note  Date:  03/15/2018 Time:  2:55 PM  Group Topic/Focus:  Stress Management  Participation Level:  Active  Participation Quality:  Appropriate  Affect:  Flat  Cognitive:  Appropriate  Insight: Improving  Engagement in Group:  Engaged  Modes of Intervention:  Activity, Discussion, Education and Socialization  Additional Comments:    S: "I like to do antiquing to manage my stress"  ?  O: Education given on healthy stress management, craft made as therapeutic activity to manage stress in relation to topic.Stress management tools worksheet discussed to differentiate negative vs positive coping skills. Pt asked to identify positive coping skills this date to use when reintegrating into community. Gratitude journal and adult coloring pages given at end of session.  ?  A: Pt presents to group with flat affect, engaged and participatory throughout session. Stress management craft made independently with increase in affect. Stress management tools worksheet completed, pt stating he enjoys taking his kids and pets to the part to do a preferred activity. He states his kids in many forms are his stress coping skills. He also enjoys car shows and researching things on the Internet when children are not available to be stress management outlet. Gratitude journal and adult coloring given at end of session.  ?  P: Pt provided with education on stress management activities to implement into daily routine. Handouts given to facilitate carryover when reintegrating into community.      Dalphine HandingKaylee Sheritha Louis, MSOT, OTR/L  [place] Kalesha [place] 03/15/2018, 2:55 PM

## 2018-03-15 NOTE — Progress Notes (Signed)
D: Patient denies SI, HI or AVH this evening. Patient presents as flat and depressed but pleasant and cooperative.  Pt. Discussed going for his MRI today and reports otherwise having a good day.  Pt. Denies any significant pain, he states he felt "a little achy" but nothing he would rate.  Pt. Discussed reducing his methadone by 10mg  and was happy with this progress.  Pt. Is visualized on the unit interacting with staff and others, he attended evening wrap up group.    A: Patient given emotional support from RN. Patient encouraged to come to staff with concerns and/or questions. Patient's medication routine continued. Patient's orders and plan of care reviewed.   R: Patient remains appropriate and cooperative. Will continue to monitor patient q15 minutes for safety.

## 2018-03-16 NOTE — Progress Notes (Signed)
[facility] MD Progress Note  03/16/2018 12:38 PM Scott [name]  MRN:  [id number] Subjective: patient reports improvement compared to admission, denies suicidal ideations. He reports increased anxiety as he approaches discharge. Denies medication side effects. Reports he does not have transportation available to get home today as wife not in town at this time.  Objective: I have discussed case with treatment team and have met with patient.  47 year old male, known to [facility] from prior admissions, history of depression, history of alcohol dependence. Admitted due to worsening depression, suicidal ideations with thoughts of overdosing. Had been drinking daily, heavily prior to admission. Reports improved mood, denies suicidal ideations, and is future oriented . I met patient with CSW - discharge planning in process. Patient expresses improvement and is focusing on discharge planning and outpatient follow ups. Today he reports some increased anxiety, and states  he has no transportation home available at this time as his wife is out of town.  Denies medication side effects. Behavior on unit in good control, no disruptive or agitated behaviors. Going to groups/visible in milieu.   Principal Problem:  MDD, Alcohol Dependence, Alcohol Induced Mood Disorder Diagnosis:   Patient Active Problem List   Diagnosis Date Noted  . Elevated liver enzymes [R74.8] 03/13/2018  . MDD (major depressive disorder), recurrent severe, without psychosis ([place]) [F33.2] 03/06/2018  . MDD (major depressive disorder), severe ([place]) [F32.2] 12/16/2017  . Schizoaffective disorder ([place]) [F25.9] 06/07/2017  . Alcohol use disorder, severe, dependence ([place]) [F10.20] 07/24/2015  . Alcohol withdrawal ([place]) [F10.239] 07/24/2015  . Cocaine use disorder, mild, abuse ([place]) [F14.10] 07/24/2015  . Substance induced mood disorder ([place]) [F19.94] 07/22/2015  . Drug-seeking behavior [Z76.5] 02/16/2015  . PTSD (post-traumatic stress disorder)  [F43.10] 12/26/2014  . Opioid type dependence, continuous ([place]) [F11.20] 12/26/2014  . Major depressive disorder, recurrent episode, moderate (HCC) [F33.1]   . Chest pain at rest [R07.9] 10/22/2013  . Chronic pain syndrome [G89.4] 10/22/2013  . Seizure disorder ([place]) [C94.496] 10/22/2013  . Chest pain [R07.9] 10/21/2013   Total Time spent with patient: 20 minutes  Past Psychiatric History: See admission H&P  Past Medical History:  Past Medical History:  Diagnosis Date  . Accidental heroin overdose ([place])   . Anxiety   . Back pain   . Bipolar 1 disorder ([place])   . Chronic back pain   . Depression   . Drug-seeking behavior   . GERD (gastroesophageal reflux disease)   . Heroin abuse ([place])   . History of ETOH abuse   . Myocardial infarction [facility])    Pt says that the doctor determined it was panic attacks  . Opioid dependence ([place])   . PTSD (post-traumatic stress disorder)   . Seizures ([place])    alcohol induced- pt reports sober for 6 months    Past Surgical History:  Procedure Laterality Date  . ABDOMINAL SURGERY    . APPENDECTOMY    . BACK SURGERY     Family History:  Family History  Problem Relation Age of Onset  . Hypertension Mother   . CAD Father   . COPD Father   . Stroke Father   . Testicular cancer Brother    Family Psychiatric  History: See admission H&P Social History:  Social History   Substance and Sexual Activity  Alcohol Use Not Currently   Comment: Pt reports sober for 6 months     Social History   Substance and Sexual Activity  Drug Use Yes  . Types: Cocaine, Methamphetamines, Marijuana,  IV   Comment: Pt says not sure why positive for cocaine    Social History   Socioeconomic History  . Marital status: Married    Spouse name: Not on file  . Number of children: Not on file  . Years of education: Not on file  . Highest education level: Not on file  Occupational History  . Not on file  Social Needs  . Financial resource strain: Not on  file  . Food insecurity:    Worry: Not on file    Inability: Not on file  . Transportation needs:    Medical: Not on file    Non-medical: Not on file  Tobacco Use  . Smoking status: Current Every Day Smoker    Packs/day: 1.50    Types: Cigarettes  . Smokeless tobacco: Never Used  Substance and Sexual Activity  . Alcohol use: Not Currently    Comment: Pt reports sober for 6 months  . Drug use: Yes    Types: Cocaine, Methamphetamines, Marijuana, IV    Comment: Pt says not sure why positive for cocaine  . Sexual activity: Yes    Birth control/protection: None    Comment: heroin  Lifestyle  . Physical activity:    Days per week: Not on file    Minutes per session: Not on file  . Stress: Not on file  Relationships  . Social connections:    Talks on phone: Not on file    Gets together: Not on file    Attends religious service: Not on file    Active member of club or organization: Not on file    Attends meetings of clubs or organizations: Not on file    Relationship status: Not on file  Other Topics Concern  . Not on file  Social History Narrative  . Not on file   Additional Social History:   Sleep: improving   Appetite:  improving   Current Medications: Current Facility-Administered Medications  Medication Dose Route Frequency Provider Last Rate Last Dose  . albuterol (PROVENTIL HFA;VENTOLIN HFA) 108 (90 Base) MCG/ACT inhaler 2 puff  2 puff Inhalation Q6H PRN Clary, Greg Lawson, MD   2 puff at 03/16/18 1107  . doxepin (SINEQUAN) capsule 50 mg  50 mg Oral QHS Clary, Greg Lawson, MD   50 mg at 03/15/18 2105  . famotidine (PEPCID) tablet 20 mg  20 mg Oral BID Clary, Greg Lawson, MD   20 mg at 03/16/18 0750  . hydrOXYzine (ATARAX/VISTARIL) tablet 25 mg  25 mg Oral Q6H PRN Davis, Laura A, NP   25 mg at 03/15/18 2105  . magnesium hydroxide (MILK OF MAGNESIA) suspension 30 mL  30 mL Oral Daily PRN Davis, Laura A, NP      . methadone (DOLOPHINE) tablet 65 mg  65 mg Oral Daily  Clary, Greg Lawson, MD   65 mg at 03/16/18 0754  . multivitamin with minerals tablet 1 tablet  1 tablet Oral Daily Berry, Jason A, NP   1 tablet at 03/16/18 0750  . nicotine (NICODERM CQ - dosed in mg/24 hours) patch 21 mg  21 mg Transdermal Daily Berry, Jason A, NP   21 mg at 03/16/18 0750  . sertraline (ZOLOFT) tablet 100 mg  100 mg Oral Daily Clary, Greg Lawson, MD   100 mg at 03/16/18 0750  . thiamine (VITAMIN B-1) tablet 100 mg  100 mg Oral Daily Berry, Jason A, NP   100 mg at 03/16/18 0750    Lab Results:    No results found for this or any previous visit (from the past 48 hour(s)).  Blood Alcohol level:  Lab Results  Component Value Date   ETH <10 03/05/2018   ETH <10 01/74/9449    Metabolic Disorder Labs: No results found for: HGBA1C, MPG No results found for: PROLACTIN No results found for: CHOL, TRIG, HDL, CHOLHDL, VLDL, LDLCALC  Physical Findings: AIMS: Facial and Oral Movements Muscles of Facial Expression: None, normal Lips and Perioral Area: None, normal Jaw: None, normal Tongue: None, normal,Extremity Movements Upper (arms, wrists, hands, fingers): None, normal Lower (legs, knees, ankles, toes): None, normal, Trunk Movements Neck, shoulders, hips: None, normal, Overall Severity Severity of abnormal movements (highest score from questions above): None, normal Incapacitation due to abnormal movements: None, normal Patient's awareness of abnormal movements (rate only patient's report): No Awareness, Dental Status Current problems with teeth and/or dentures?: No Does patient usually wear dentures?: No  CIWA:  CIWA-Ar Total: 2 COWS:     Musculoskeletal: Strength & Muscle Tone: within normal limits Gait & Station: normal Patient leans: N/A  Psychiatric Specialty Exam: Physical Exam  Nursing note and vitals reviewed. Constitutional: He is oriented to person, place, and time. He appears well-developed and well-nourished.  HENT:  Head: Normocephalic and  atraumatic.  Respiratory: Effort normal.  Neurological: He is alert and oriented to person, place, and time.    ROS denies chest pain, no shortness of breath, no choluria or acholia, no RUQ increased pain or discomfort , no fever or chills   Blood pressure 130/82, pulse (!) 58, temperature 98.1 F (36.7 C), temperature source Oral, resp. rate 18, height 6' (1.829 m), weight 120.2 kg, SpO2 98 %.Body mass index is 35.94 kg/m.  General Appearance: improved grooming   Eye Contact:  Good  Speech:  Normal Rate  Volume:  Normal  Mood:  improved mood, reports mood as " better"  Affect:  Appropriate and reports feeling more anxious today  Thought Process:  Linear and Descriptions of Associations: Intact  Orientation:  Full (Time, Place, and Person)  Thought Content:  no hallucinations, no delusions, not internally preoccupied   Suicidal Thoughts:  No denies suicidal or self injurious ideations, denies homicidal or violent ideations, contracts for safety on unit   Homicidal Thoughts:  No  Memory:  recent and remote grossly intact   Judgement:  Other:  improving   Insight:  improving   Psychomotor Activity:  Normal- no current psychomotor agitation or restlessness   Concentration:  Concentration: Good and Attention Span: Good  Recall:  Good  Fund of Knowledge:  Good  Language:  Good  Akathisia:  Negative  Handed:  Right  AIMS (if indicated):     Assets:  Communication Skills Desire for Improvement Resilience Talents/Skills  ADL's:  Intact  Cognition:  WNL  Sleep:  Number of Hours: 6.25   Assessment - 47 year old male, known to [facility] from prior admissions, history of mood disorder and alcohol dependence, admitted due to depression, suicidal ideations, alcohol abuse.  Patient presenting with improving mood and range of affect, denies SI, is tolerating medications well thus far . Current focus is on discharge planning. Patient is future oriented, plans to return home, but today reports  increased anxiety, partly related to wife being out of town and having no one available for transportation at this time, including to and from methadone maintenance clinic . Has reviewed disposition planning/ options with CSW, Probation officer. At this time plans to return home, continue outpatient treatment with [facility]- including  methadone maintenance, psychiatric management and IOP.  Treatment Plan Summary: Treatment plan reviewed as below today 8/8 Encourage group and milieu participation to work on coping skills and symptom reduction Encourage efforts to work on sobriety and relapse prevention Treatment Team working on disposition planning options Continue Doxepin 50 mgrs QHS for insomnia, mood Continue Methadone 65 mgrs QDAY  for opiate dependence/maintenance  Continue Zoloft 100 mgrs QDAY for depression, anxiety Patient encouraged to follow-up with gastroenterology/infectious disease for ongoing monitoring and management of liver disease/hep C.     A , MD 03/16/2018, 12:38 PM   Patient ID: Scott [name], male   DOB: 02/08/1971, 47 y.o.   MRN: [id number]  

## 2018-03-16 NOTE — Progress Notes (Signed)
Adult Psychoeducational Group Note  Date:  03/16/2018 Time:  7:09 PM  Group Topic/Focus:  Goals Group:   The focus of this group is to help patients establish daily goals to achieve during treatment and discuss how the patient can incorporate goal setting into their daily lives to aide in recovery.  Participation Level:  Active  Participation Quality:  Attentive  Affect:  Appropriate  Cognitive:  Alert  Insight: Appropriate  Engagement in Group:  Engaged  Modes of Intervention:  Discussion  Additional Comments:  Pt attended goals group and participated in all group discussions.  Gamble Enderle R Kennieth Plotts 03/16/2018, 7:09 PM

## 2018-03-16 NOTE — [facility] Group Notes (Signed)
[facility] LCSW Group Therapy Note  Date/Time: 03/16/18, 1315  Type of Therapy/Topic:  Group Therapy:  Balance in Life  Participation Level:  active  Description of Group:    This group will address the concept of balance and how it feels and looks when one is unbalanced. Patients will be encouraged to process areas in their lives that are out of balance, and identify reasons for remaining unbalanced. Facilitators will guide patients utilizing problem- solving interventions to address and correct the stressor making their life unbalanced. Understanding and applying boundaries will be explored and addressed for obtaining  and maintaining a balanced life. Patients will be encouraged to explore ways to assertively make their unbalanced needs known to significant others in their lives, using other group members and facilitator for support and feedback.  Therapeutic Goals: 1. Patient will identify two or more emotions or situations they have that consume much of in their lives. 2. Patient will identify signs/triggers that life has become out of balance:  3. Patient will identify two ways to set boundaries in order to achieve balance in their lives:  4. Patient will demonstrate ability to communicate their needs through discussion and/or role plays  Summary of Patient Progress:Pt shared that friends and family are currently out of balance in his life.  Pt active during group discussion but continues to want to place blame on his family for the issues that are occurring in his situation.          Therapeutic Modalities:   Cognitive Behavioral Therapy Solution-Focused Therapy Assertiveness Training  Daleen SquibbGreg Lorrene Graef, [place]

## 2018-03-16 NOTE — Plan of Care (Signed)
  Problem: Education: Goal: Emotional status will improve Outcome: Progressing   Problem: Education: Goal: Mental status will improve Outcome: Progressing   Problem: Activity: Goal: Interest or engagement in activities will improve Outcome: Progressing   Problem: Activity: Goal: Sleeping patterns will improve Outcome: Progressing   

## 2018-03-16 NOTE — Progress Notes (Signed)
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 Pt reports history of  therapy and different treatments but has not seen a lot of success. Pt is grateful for his three boys aged 506, 2710, and 5216 whom he will weather a tough relationship with his wife in order to be with his children everyday.   Marcille BuffyMcNeil, Vitoria Conyer S 03/16/2018, 9:49 PM

## 2018-03-16 NOTE — Progress Notes (Signed)
Patient self inventory- Patient slept fair last night, sleep medication was not requested. Appetite was good, energy level normal, and concentration poor. Depression, anxiety, and hopelessness rated 4, 7, 4. Denies withdrawal symptoms.Pain rated 7 out of 10 in the lower lumbar and right leg. Patient's goal is to work on a discharge plan. Patient is compliant with medications prescribed per provider. No questions or concerns. Safety is maintained with 15 minute checks and environmental checks. Will continue to monitor.

## 2018-03-17 ENCOUNTER — Other Ambulatory Visit: Payer: Self-pay | Admitting: Family

## 2018-03-17 DIAGNOSIS — B182 Chronic viral hepatitis C: Secondary | ICD-10-CM

## 2018-03-17 MED ORDER — PRAZOSIN HCL 2 MG PO CAPS: 2.0000 mg | ORAL_CAPSULE | Freq: Every day | ORAL | 0 refills | Status: DC

## 2018-03-17 MED ORDER — NICOTINE 21 MG/24HR TD PT24: 21.0000 mg | MEDICATED_PATCH | Freq: Every day | TRANSDERMAL | 0 refills | Status: DC

## 2018-03-17 MED ORDER — SERTRALINE HCL 100 MG PO TABS: 100.0000 mg | ORAL_TABLET | Freq: Every day | ORAL | 0 refills | Status: DC

## 2018-03-17 MED ORDER — DOXEPIN HCL 50 MG PO CAPS: 50.0000 mg | ORAL_CAPSULE | Freq: Every day | ORAL | 0 refills | Status: DC

## 2018-03-17 NOTE — [facility] Suicide Risk Assessment (Signed)
[facility]   Principal Problem: depression Discharge Diagnoses:  Patient Active Problem List   Diagnosis Date Noted  . Elevated liver enzymes [R74.8] 03/13/2018  . MDD (major depressive disorder), recurrent severe, without psychosis (HCC) [F33.2] 03/06/2018  . MDD (major depressive disorder), severe (HCC) [F32.2] 12/16/2017  . Schizoaffective disorder (HCC) [F25.9] 06/07/2017  . Alcohol use disorder, severe, dependence (HCC) [F10.20] 07/24/2015  . Alcohol withdrawal (HCC) [F10.239] 07/24/2015  . Cocaine use disorder, mild, abuse (HCC) [F14.10] 07/24/2015  . Substance induced mood disorder (HCC) [F19.94] 07/22/2015  . Drug-seeking behavior [Z76.5] 02/16/2015  . PTSD (post-traumatic stress disorder) [F43.10] 12/26/2014  . Opioid type dependence, continuous (HCC) [F11.20] 12/26/2014  . Major depressive disorder, recurrent episode, moderate (HCC) [F33.1]   . Chest pain at rest [R07.9] 10/22/2013  . Chronic pain syndrome [G89.4] 10/22/2013  . Seizure disorder (HCC) [G40.909] 10/22/2013  . Chest pain [R07.9] 10/21/2013    Total Time spent with patient: 30 minutes  Musculoskeletal: Strength & Muscle Tone: within normal limits Gait & Station: normal Patient leans: N/A  Psychiatric Specialty Exam: ROS no headache, no chest pain, no shortness of breath, no vomiting,denies acholia or choluria,reports mild RUQ discomfort at times. No fever, no chills    Blood pressure 126/80, pulse 66, temperature 98.5 F (36.9 C), temperature source Oral, resp. rate 20, height 6' (1.829 m), weight 120.2 kg, SpO2 100 %.Body mass index is 35.94 kg/m.  General Appearance: Well Groomed  Eye Contact::  Good  Speech:  Normal Rate409  Volume:  Normal  Mood:  reports improved mood and presents euthymic   Affect:  Appropriate and Full Range  Thought Process:  Linear and Descriptions of Associations: Intact  Orientation:  Full (Time, Place, and Person)  Thought Content:  no  hallucinations,no delusions, not internally preoccupied   Suicidal Thoughts:  No denies suicidal or self injurious ideations, no homicidal or violent ideations  Homicidal Thoughts:  No  Memory:  recent and remote grossly intact   Judgement:  Other:  improved   Insight:  improved   Psychomotor Activity:  Normal  Concentration:  Good  Recall:  Good  Fund of Knowledge:Good  Language: Good  Akathisia:  Negative  Handed:  Right  AIMS (if indicated):     Assets:  Communication Skills Desire for Improvement Resilience  Sleep:  Number of Hours: 6.5  Cognition: WNL  ADL's:  Intact   Mental Status Per Nursing Assessment::   On Admission:  Suicidal ideation indicated by patient, Suicide plan, Self-harm thoughts  Demographic Factors:  47 year old married male, lives with wife, has three children  Loss Factors: Alcohol, drug abuse/relapse,medication non compliance   Historical Factors: History of prior psychiatric admissions, history of depression, history of alcohol use disorder, cocaine use disorder   Risk Reduction Factors:   Responsible for children under 218 years of age, Living with another person, especially a relative and Positive coping skills or problem solving skills  Continued Clinical Symptoms:  At this time patient is alert, attentive, mood improved, affect appropriate, reactive, no thought disorder, no suicidal ideations , no homicidal ideations, no hallucinations , no delusions, not internally preoccupied . Visible on unit,calm, pleasant on approach Denies medication side effects at this time.   Cognitive Features That Contribute To Risk:  No gross cognitive deficits noted upon discharge. Is alert , attentive, and oriented x 3   Suicide Risk:  Mild:  Suicidal ideation of limited frequency, intensity, duration, and specificity.  There are no identifiable plans,  no associated intent, mild dysphoria and related symptoms, good self-control (both objective and subjective  assessment), few other risk factors, and identifiable protective factors, including available and accessible social support.  Follow-up Information    [facility] Go in 1 day(s).   Why:   Please attend your daily methadone dosing Friday, 03/17/18, between 6am-10:30am. Council Mechanic will meet with you at that time. IOP hours are Mon-Wed-Fri between 9-12:00noon.  Please attend your medication appt with Dr Betti Cruz on Wed 03/22/18 at 9am. Contact information: [address], [place] [address] [phone] Fax:[phone]       [facility]. Go on 04/05/2018.   Why:  Please go for blood draw on Wednesday, 04/05/18, at 9:00am.  Please attend your appt with Dr Staci Righter on Wednesday, 04/12/18, at 9:00am. Contact information: [address]  [place],  [place]  [address] P: [phone] F: [phone]          Plan Of Care/Follow-up recommendations:  Activity:  as tolerated  Diet:  heart healthy Tests:  NA Other:  See below  Patient is expressing readiness for discharge and is leaving unit in good spirits , plans to return home Plans to follow up as above . He is on methadone maintenance at [facility].  Plans to follow up at ID clinic for monitoring and treatment of Hep C Also plans to follow up with PCP for medical management as needed   Craige Cotta, MD 03/17/2018, 11:13 AM

## 2018-03-17 NOTE — Progress Notes (Signed)
Recreation Therapy Notes  Date: 8.9.19 Time: 0930 Location: [address] Dayroom  Group Topic: Stress Management  Goal Area(s) Addresses:  Patient will verbalize importance of using healthy stress management.  Patient will identify positive emotions associated with healthy stress management.   Intervention: Stress Management  Activity :  Guided Imagery.  LRT introduced the stress management technique of guided imagery.  LRT read a script to guide patients on mental vacation through a wildlife sanctuary.  Patients were to listen and follow along as LRT read script.  Education:  Stress Management, Discharge Planning.   Education Outcome: Acknowledges edcuation/In group clarification offered/Needs additional education  Clinical Observations/Feedback: Pt did not attend group.     Solomon Skowronek, LRT/CTRS         Sabri Teal A 03/17/2018 11:13 AM 

## 2018-03-17 NOTE — Progress Notes (Signed)
Pr reports he is doing good this evening and is supposed to discharge home tomorrow.  He feels his meds are at correct dosing and denies SI/HI/AVH at this time.  He has voiced no needs or concerns to staff.  He has been in the dayroom most of the evening watching TV and talking with peers.  Support and encouragement offered.  Discharge plans are in process.  Safety maintained with q15 minute checks.

## 2018-03-17 NOTE — Progress Notes (Signed)
  [facility]  Will you be returning to the same living situation after discharge:  Yes,  with wife, family At discharge, do you have transportation home?: Yes,  wife Do you have the ability to pay for your medications: Yes,  medicare  Release of information consent forms completed and in the chart;  Patient's signature needed at discharge.  Patient to Follow up at: Follow-up Information    [facility] in [facility] day(s).   Why:   Please attend your daily methadone dosing Friday, 03/17/18, between 6am-10:30am. Council MechanicLes will meet with you at that time. IOP hours are Mon-Wed-Fri between 9-12:00noon.  Please attend your medication appt with Dr Betti Cruzeddy on Wed 03/22/18 at 9am. Contact information: [address], [place] [id number] [phone] Fax:[phone]       [facility] Go [facility]2018.   Why:  Please go for blood draw on Wednesday, 04/05/18, at 9:00am.  Please attend your appt with Dr Staci Righterobert Comer on Wednesday, 04/12/18, at 9:00am. Contact information: [address]  [place]  [place]  [id number] P: [phone] F: [phone]          Next level of care provider has access to [facility] Link:no  Safety Planning and Suicide Prevention discussed: Yes,  with wife  Have you used any form of tobacco in the last 30 days? (Cigarettes, Smokeless Tobacco, Cigars, and/or Pipes): Yes  Has patient been referred to the Quitline?: Patient refused referral  Patient has been referred for addiction treatment: Yes  Lorri FrederickWierda, Arrow Emmerich Jon, LCSW 03/17/2018, 9:45 AM

## 2018-03-17 NOTE — Discharge Summary (Addendum)
Physician Discharge Summary Note  Patient:  Scott [name] is an 47 y.o., male MRN:  [id number] DOB:  08/01/1971 Patient phone:  [phone] (home)  Patient address:   [phone] [place] [address],  Total Time spent with patient: 30 minutes  Date of Admission:  03/06/2018 Date of Discharge:03/17/2018  Reason for Admission:  History of Present Illness: Patient is seen and examined.  Patient is a 47 year old male who presented for a voluntary psychiatric admission at the [facility] emergency department on 7/28.  The patient stated that his wife and walked in on him while he was attempting to take a bottle of 70 Seroquel tablets.  Patient reported that his wife stopped him from this.  The patient reported that he has had increased depression over the last 3 months.  The patient reported he feels guilty because he is dragging everyone down.  He also stated he feels like he is a burden.  He was most recently in the psychiatric hospital at our facility was on 12/16/2017.  There is a note in the chart that he had a referral to the [facility] on [facility]/2018.  He also has been previously admitted at [facility].  His drug screen on admission was positive for cocaine.  The patient reported drinking 2/5 of liquor a day as well as a case of beer the day prior to admission.  He told the staff in the emergency room if he was discharged from the emergency room he would not contract for safety.  He did agreed to a voluntary admission.  He was admitted to the hospital for evaluation and stabilization.  He said he wanted the biggest issues that he has is that his wife will take him to his appointments.  He is unable to get to substance abuse treatment and that frustrates him.  He is unable to get to psychiatric treatment and that frustrates him.  He stated everyone lets him down.  But he admitted to 10-15 previous psychiatric admissions, and he has been in an  [facility] facility or rehab at least 4-5 times in his past.  Associated Signs/Symptoms: Depression Symptoms:  depressed mood, feelings of worthlessness/guilt, disturbed sleep, decreased appetite, (Hypo) Manic Symptoms:  Distractibility, Anxiety Symptoms:  Excessive Worry, Psychotic Symptoms:  Hallucinations: None PTSD Symptoms: Avoidance:  Decreased Interest/Participation  Principal Problem: MDD (major depressive disorder), recurrent severe, without psychosis (HCC) Discharge Diagnoses: Patient Active Problem List   Diagnosis Date Noted  . Elevated liver enzymes [R74.8] 03/13/2018  . MDD (major depressive disorder), recurrent severe, without psychosis (HCC) [F33.2] 03/06/2018  . MDD (major depressive disorder), severe (HCC) [F32.2] 12/16/2017  . Schizoaffective disorder (HCC) [F25.9] 06/07/2017  . Alcohol use disorder, severe, dependence (HCC) [F10.20] 07/24/2015  . Alcohol withdrawal (HCC) [F10.239] 07/24/2015  . Cocaine use disorder, mild, abuse (HCC) [F14.10] 07/24/2015  . Substance induced mood disorder (HCC) [F19.94] 07/22/2015  . Drug-seeking behavior [Z76.5] 02/16/2015  . PTSD (post-traumatic stress disorder) [F43.10] 12/26/2014  . Opioid type dependence, continuous (HCC) [F11.20] 12/26/2014  . Major depressive disorder, recurrent episode, moderate (HCC) [F33.1]   . Chest pain at rest [R07.9] 10/22/2013  . Chronic pain syndrome [G89.4] 10/22/2013  . Seizure disorder (HCC) [G40.909] 10/22/2013  . Chest pain [R07.9] 10/21/2013    Past Psychiatric History: Patient is had 10-15 psychiatric hospitalizations in the past.  This includes admissions for detox.  He also has been and substance abuse rehab facilities at  least 4-5 times in his lifetime.  He stated that the longest period of sobriety had afterwards was approximately a week.  His last psychiatric hospitalization at our facility was in May of this year.  Past Medical History:  Past Medical History:  Diagnosis  Date  . Accidental heroin overdose (HCC)   . Anxiety   . Back pain   . Bipolar 1 disorder (HCC)   . Chronic back pain   . Depression   . Drug-seeking behavior   . GERD (gastroesophageal reflux disease)   . Heroin abuse (HCC)   . History of ETOH abuse   . Myocardial infarction [facility])    Pt says that the doctor determined it was panic attacks  . Opioid dependence (HCC)   . PTSD (post-traumatic stress disorder)   . Seizures (HCC)    alcohol induced- pt reports sober for 6 months    Past Surgical History:  Procedure Laterality Date  . ABDOMINAL SURGERY    . APPENDECTOMY    . BACK SURGERY     Family History:  Family History  Problem Relation Age of Onset  . Hypertension Mother   . CAD Father   . COPD Father   . Stroke Father   . Testicular cancer Brother    Family Psychiatric  History: Reports mother side of the family has history of depression schizophrenia reports uncle: Psychosis  Social History:  Social History   Substance and Sexual Activity  Alcohol Use Not Currently   Comment: Pt reports sober for 6 months     Social History   Substance and Sexual Activity  Drug Use Yes  . Types: Cocaine, Methamphetamines, Marijuana, IV   Comment: Pt says not sure why positive for cocaine    Social History   Socioeconomic History  . Marital status: Married    Spouse name: Not on file  . Number of children: Not on file  . Years of education: Not on file  . Highest education level: Not on file  Occupational History  . Not on file  Social Needs  . Financial resource strain: Not on file  . Food insecurity:    Worry: Not on file    Inability: Not on file  . Transportation needs:    Medical: Not on file    Non-medical: Not on file  Tobacco Use  . Smoking status: Current Every Day Smoker    Packs/day: 1.50    Types: Cigarettes  . Smokeless tobacco: Never Used  Substance and Sexual Activity  . Alcohol use: Not Currently    Comment: Pt reports sober for 6 months  .  Drug use: Yes    Types: Cocaine, Methamphetamines, Marijuana, IV    Comment: Pt says not sure why positive for cocaine  . Sexual activity: Yes    Birth control/protection: None    Comment: heroin  Lifestyle  . Physical activity:    Days per week: Not on file    Minutes per session: Not on file  . Stress: Not on file  Relationships  . Social connections:    Talks on phone: Not on file    Gets together: Not on file    Attends religious service: Not on file    Active member of club or organization: Not on file    Attends meetings of clubs or organizations: Not on file    Relationship status: Not on file  Other Topics Concern  . Not on file  Social History Narrative  . Not on file  Hospital Course:  MILIND RAETHER was admitted for <principal problem not specified> and crisis management.  She was treated with the following medications Zoloft 100mg  po daily for depression, Buspar 10mg  po BID.   Scott Kettle was discharged with current medication and was instructed on how to take medications as prescribed; (details listed below under Medication List).  Medical problems were identified and treated as needed.  Home medications were restarted as appropriate.  Labs obtained during admission were reviewed and assessed and determined to be abnormal included AST at 315, ALT at 346, and UDS positive for cocaine. Hepatitis A positive, Hep C AB also positive, Hep C RNA not available at time of discharge. Magnesium is also low at 2.0, with increased Ammonia levels of 48.   Improvement was monitored by observation and Scott Kettle daily report of symptom reduction.  Emotional and mental status was monitored by daily self-inventory reports completed by Scott Kettle and clinical staff.         Scott Kettle was evaluated by the treatment team for stability and plans for continued recovery upon discharge.  Scott Kettle motivation was an integral factor for scheduling further treatment.  Employment,  transportation, bed availability, health status, family support, and any pending legal issues were also considered during his hospital stay.  He was offered further treatment options upon discharge including but not limited to Residential, Intensive Outpatient, and Outpatient treatment.  Scott Kettle will follow up with the services as listed below under Follow Up Information.     Upon completion of this admission the Scott Kettle was both mentally and medically stable for discharge denying suicidal/homicidal ideation, auditory/visual/tactile hallucinations, delusional thoughts and paranoia.     Physical Findings: AIMS: Facial and Oral Movements Muscles of Facial Expression: None, normal Lips and Perioral Area: None, normal Jaw: None, normal Tongue: None, normal,Extremity Movements Upper (arms, wrists, hands, fingers): None, normal Lower (legs, knees, ankles, toes): None, normal, Trunk Movements Neck, shoulders, hips: None, normal, Overall Severity Severity of abnormal movements (highest score from questions above): None, normal Incapacitation due to abnormal movements: None, normal Patient's awareness of abnormal movements (rate only patient's report): No Awareness, Dental Status Current problems with teeth and/or dentures?: No Does patient usually wear dentures?: No  CIWA:  CIWA-Ar Total: 2 COWS:     Musculoskeletal: Strength & Muscle Tone: within normal limits Gait & Station: normal Patient leans: N/A  Psychiatric Specialty Exam: See MD SRA Physical Exam   ROS   Blood pressure 126/80, pulse 66, temperature 98.5 F (36.9 C), temperature source Oral, resp. rate 20, height 6' (1.829 m), weight 120.2 kg, SpO2 100 %.Body mass index is 35.94 kg/m.  Sleep:  Number of Hours: 6.5    Have you used any form of tobacco in the last 30 days? (Cigarettes, Smokeless Tobacco, Cigars, and/or Pipes): Yes  Has this patient used any form of tobacco in the last 30 days? (Cigarettes, Smokeless  Tobacco, Cigars, and/or Pipes)  No  Blood Alcohol level:  Lab Results  Component Value Date   ETH <10 03/05/2018   ETH <10 12/16/2017    Metabolic Disorder Labs:  No results found for: HGBA1C, MPG No results found for: PROLACTIN No results found for: CHOL, TRIG, HDL, CHOLHDL, VLDL, LDLCALC  See Psychiatric Specialty Exam and Suicide Risk Assessment completed by Attending Physician prior to discharge.  Discharge destination:  Home  Is patient on multiple antipsychotic therapies at discharge:  No   Has Patient had  three or more failed trials of antipsychotic monotherapy by history:  No  Recommended Plan for Multiple Antipsychotic Therapies: NA  Discharge Instructions    Discharge instructions   Complete by:  As directed    Please continue to take medications as directed. If your symptoms return, worsen, or persist please call your 911, report to local ER, or contact crisis hotline. Please do not drink alcohol or use any illegal substances while taking prescription medications.     Allergies as of 03/17/2018      Reactions   Benadryl [diphenhydramine Hcl] Anaphylaxis, Other (See Comments)   Muscles lock up.   Celecoxib Diarrhea, Rash, Other (See Comments)   Nose bleeds, bloating, migraines   Diphenhydramine Anaphylaxis   Hard time breathing, convulsions,   Prednisone Other (See Comments)   Severe Anger   Risperidone And Related Other (See Comments)   Overly sedates   Nsaids Diarrhea, Rash, Other (See Comments)   Nose bleeds, bloating, migraines   Other Other (See Comments)   STEROIDS--caused anger,irritation   Rofecoxib Diarrhea, Rash, Other (See Comments)   Nose bleeds, bloating, migraines   Tolmetin Diarrhea, Rash, Other (See Comments)   Nose bleeds, bloating, migraines   Trazodone And Nefazodone Other (See Comments)   headaches   Ibuprofen Rash   Latex Itching, Rash, Other (See Comments)   Skin cracks   Paliperidone Palmitate Er Other (See Comments)   Overly  sedates      Medication List    STOP taking these medications   acetaminophen 650 MG CR tablet Commonly known as:  TYLENOL   benztropine 1 MG tablet Commonly known as:  COGENTIN   busPIRone 10 MG tablet Commonly known as:  BUSPAR   busPIRone 15 MG tablet Commonly known as:  BUSPAR   hydrOXYzine 25 MG tablet Commonly known as:  ATARAX/VISTARIL   multivitamin with minerals Tabs tablet   QUEtiapine 100 MG tablet Commonly known as:  SEROQUEL     TAKE these medications     Indication  albuterol 108 (90 Base) MCG/ACT inhaler Commonly known as:  PROVENTIL HFA;VENTOLIN HFA Inhale 2 puffs into the lungs every 6 (six) hours as needed for wheezing or shortness of breath.  Indication:  Asthma   DOCUSATE SODIUM PO Take 3 capsules by mouth daily.  Indication:  constipation   doxepin 50 MG capsule Commonly known as:  SINEQUAN Take 1 capsule (50 mg total) by mouth at bedtime.  Indication:  Atypical Depression, insomnia   METHADONE HCL PO Take 75 mg by mouth daily.  Indication:  Moderate to Severe Chronic Pain   nicotine 21 mg/24hr patch Commonly known as:  NICODERM CQ - dosed in mg/24 hours Place 1 patch (21 mg total) onto the skin daily. Start taking on:  03/18/2018 What changed:    when to take this  reasons to take this  Indication:  Nicotine Addiction   pantoprazole 40 MG tablet Commonly known as:  PROTONIX Take 1 tablet (40 mg total) by mouth daily.  Indication:  Heartburn   prazosin 2 MG capsule Commonly known as:  MINIPRESS Take 1 capsule (2 mg total) by mouth at bedtime.  Indication:  night terrors   sertraline 100 MG tablet Commonly known as:  ZOLOFT Take 1 tablet (100 mg total) by mouth daily. Start taking on:  03/18/2018 What changed:    medication strength  how much to take  Indication:  Major Depressive Disorder      Follow-up Information    [facility]  in [facility] day(s).   Why:   Please attend your daily methadone dosing  Friday, 03/17/18, between 6am-10:30am. Council Mechanic will meet with you at that time. IOP hours are Mon-Wed-Fri between 9-12:00noon.  Please attend your medication appt with Dr Betti Cruz on Wed 03/22/18 at 9am. Contact information: [address] [place] [address] Telephone:[phone] Fax:[phone]       [facility]. Go on 04/05/2018.   Why:  Please go for blood draw on Wednesday, 04/05/18, at 9:00am.  Please attend your appt with Dr Staci Righter on Wednesday, 04/12/18, at 9:00am. Contact information: [address]  [place],  [place]  [address] P: [phone] F: [phone]          Follow-up recommendations:  Activity:  Increase activity as tolerated. Diet:  Regular house diet as tolerated Tests:  Routine test as suggested by outpatient pscyhiatrist.  Suggest following up with liver enzyme panel, and possible referral to hepatology clinic for further management of elevated liver enzymes. Other:  Even if you begiin to feel better continue taking your medications.   Signed: Truman Hayward, FNP 03/17/2018, 10:55 AM  Patient seen, Suicide Assessment Completed.  Disposition Plan Reviewed

## 2018-03-17 NOTE — Progress Notes (Signed)
Pt is preparing for his dc today as he completes his daily assessment. On this, he wrote he denied having SI today and he rated his depression, hopelessness and anxiety " 4/3/5", respectively. His dc instrucitons are reviewed with him by this Clinical research associatewriter, he states verbal understanding and willingness to comply. He is given belongigns that were in his locker, cc of dc instructions ( SRA< AVS< SSP and trnasition record). He is then escorted to bldg entrance per MD order.

## 2018-03-18 DIAGNOSIS — Z79891 Long term (current) use of opiate analgesic: Secondary | ICD-10-CM | POA: Diagnosis not present

## 2018-03-28 DIAGNOSIS — Z79891 Long term (current) use of opiate analgesic: Secondary | ICD-10-CM | POA: Diagnosis not present

## 2018-04-05 ENCOUNTER — Other Ambulatory Visit: Payer: Self-pay

## 2018-04-12 ENCOUNTER — Ambulatory Visit: Payer: Self-pay | Admitting: Internal Medicine

## 2018-04-14 DIAGNOSIS — R1084 Generalized abdominal pain: Secondary | ICD-10-CM | POA: Diagnosis not present

## 2018-04-14 DIAGNOSIS — G8929 Other chronic pain: Secondary | ICD-10-CM | POA: Diagnosis not present

## 2018-04-14 DIAGNOSIS — R945 Abnormal results of liver function studies: Secondary | ICD-10-CM | POA: Diagnosis not present

## 2018-04-14 DIAGNOSIS — R112 Nausea with vomiting, unspecified: Secondary | ICD-10-CM | POA: Diagnosis not present

## 2018-04-14 DIAGNOSIS — I252 Old myocardial infarction: Secondary | ICD-10-CM | POA: Diagnosis not present

## 2018-04-14 DIAGNOSIS — B1920 Unspecified viral hepatitis C without hepatic coma: Secondary | ICD-10-CM | POA: Diagnosis not present

## 2018-04-14 DIAGNOSIS — Z79899 Other long term (current) drug therapy: Secondary | ICD-10-CM | POA: Diagnosis not present

## 2018-04-14 DIAGNOSIS — Z87891 Personal history of nicotine dependence: Secondary | ICD-10-CM | POA: Diagnosis not present

## 2018-04-14 DIAGNOSIS — R109 Unspecified abdominal pain: Secondary | ICD-10-CM | POA: Diagnosis not present

## 2018-04-14 DIAGNOSIS — Z79891 Long term (current) use of opiate analgesic: Secondary | ICD-10-CM | POA: Diagnosis not present

## 2018-04-14 DIAGNOSIS — R197 Diarrhea, unspecified: Secondary | ICD-10-CM | POA: Diagnosis not present

## 2018-04-14 DIAGNOSIS — J449 Chronic obstructive pulmonary disease, unspecified: Secondary | ICD-10-CM | POA: Diagnosis not present

## 2018-04-14 DIAGNOSIS — Z91040 Latex allergy status: Secondary | ICD-10-CM | POA: Diagnosis not present

## 2018-04-14 DIAGNOSIS — Z888 Allergy status to other drugs, medicaments and biological substances status: Secondary | ICD-10-CM | POA: Diagnosis not present

## 2018-04-14 DIAGNOSIS — F418 Other specified anxiety disorders: Secondary | ICD-10-CM | POA: Diagnosis not present

## 2018-04-14 DIAGNOSIS — R569 Unspecified convulsions: Secondary | ICD-10-CM | POA: Diagnosis not present

## 2018-04-14 DIAGNOSIS — Z886 Allergy status to analgesic agent status: Secondary | ICD-10-CM | POA: Diagnosis not present

## 2018-04-14 DIAGNOSIS — I10 Essential (primary) hypertension: Secondary | ICD-10-CM | POA: Diagnosis not present

## 2018-04-22 DIAGNOSIS — R112 Nausea with vomiting, unspecified: Secondary | ICD-10-CM | POA: Diagnosis not present

## 2018-04-22 DIAGNOSIS — Z87891 Personal history of nicotine dependence: Secondary | ICD-10-CM | POA: Diagnosis not present

## 2018-04-22 DIAGNOSIS — J449 Chronic obstructive pulmonary disease, unspecified: Secondary | ICD-10-CM | POA: Diagnosis not present

## 2018-04-22 DIAGNOSIS — I10 Essential (primary) hypertension: Secondary | ICD-10-CM | POA: Diagnosis not present

## 2018-04-22 DIAGNOSIS — R12 Heartburn: Secondary | ICD-10-CM | POA: Diagnosis not present

## 2018-04-22 DIAGNOSIS — R197 Diarrhea, unspecified: Secondary | ICD-10-CM | POA: Diagnosis not present

## 2018-04-22 DIAGNOSIS — R42 Dizziness and giddiness: Secondary | ICD-10-CM | POA: Diagnosis not present

## 2018-05-01 ENCOUNTER — Emergency Department (HOSPITAL_COMMUNITY): Payer: PPO

## 2018-05-01 ENCOUNTER — Encounter (HOSPITAL_COMMUNITY): Payer: Self-pay | Admitting: Emergency Medicine

## 2018-05-01 ENCOUNTER — Emergency Department (HOSPITAL_COMMUNITY)
Admission: EM | Admit: 2018-05-01 | Discharge: 2018-05-02 | Disposition: A | Discharge status: Home / Self Care | Payer: PPO | Attending: Emergency Medicine | Admitting: Emergency Medicine

## 2018-05-01 DIAGNOSIS — R202 Paresthesia of skin: Secondary | ICD-10-CM | POA: Diagnosis not present

## 2018-05-01 DIAGNOSIS — Z5321 Procedure and treatment not carried out due to patient leaving prior to being seen by health care provider: Secondary | ICD-10-CM | POA: Diagnosis not present

## 2018-05-01 DIAGNOSIS — R51 Headache: Secondary | ICD-10-CM | POA: Diagnosis not present

## 2018-05-01 LAB — CBC
HCT: 43.1 % (ref 39.0–52.0)
Hemoglobin: 15.5 g/dL (ref 13.0–17.0)
MCH: 32.6 pg (ref 26.0–34.0)
MCHC: 36 g/dL (ref 30.0–36.0)
MCV: 90.5 fL (ref 78.0–100.0)
Platelets: 193 10*3/uL (ref 150–400)
RBC: 4.76 MIL/uL (ref 4.22–5.81)
RDW: 13.9 % (ref 11.5–15.5)
WBC: 7.8 10*3/uL (ref 4.0–10.5)

## 2018-05-01 LAB — COMPREHENSIVE METABOLIC PANEL
ALT: 219 U/L — ABNORMAL HIGH (ref 0–44)
AST: 123 U/L — ABNORMAL HIGH (ref 15–41)
Albumin: 3.5 g/dL (ref 3.5–5.0)
Alkaline Phosphatase: 67 U/L (ref 38–126)
Anion gap: 9 (ref 5–15)
BUN: 12 mg/dL (ref 6–20)
CO2: 24 mmol/L (ref 22–32)
Calcium: 8.7 mg/dL — ABNORMAL LOW (ref 8.9–10.3)
Chloride: 108 mmol/L (ref 98–111)
Creatinine, Ser: 0.68 mg/dL (ref 0.61–1.24)
GFR calc Af Amer: >60 mL/min (ref >60)
GFR calc non Af Amer: >60 mL/min (ref >60)
Glucose, Bld: 125 mg/dL — ABNORMAL HIGH (ref 70–99)
Potassium: 3.3 mmol/L — ABNORMAL LOW (ref 3.5–5.1)
Sodium: 141 mmol/L (ref 135–145)
Total Bilirubin: 1 mg/dL (ref 0.3–1.2)
Total Protein: 6.8 g/dL (ref 6.5–8.1)

## 2018-05-01 LAB — I-STAT TROPONIN, ED: Troponin i, poc: 0 ng/mL (ref 0.00–0.08)

## 2018-05-01 LAB — I-STAT CHEM 8, ED
BUN: 11 mg/dL (ref 6–20)
Calcium, Ion: 1.09 mmol/L — ABNORMAL LOW (ref 1.15–1.40)
Chloride: 108 mmol/L (ref 98–111)
Creatinine, Ser: 0.6 mg/dL — ABNORMAL LOW (ref 0.61–1.24)
Glucose, Bld: 123 mg/dL — ABNORMAL HIGH (ref 70–99)
HCT: 42 % (ref 39.0–52.0)
Hemoglobin: 14.3 g/dL (ref 13.0–17.0)
Potassium: 3.3 mmol/L — ABNORMAL LOW (ref 3.5–5.1)
Sodium: 143 mmol/L (ref 135–145)
TCO2: 23 mmol/L (ref 22–32)

## 2018-05-01 LAB — APTT: aPTT: 31 seconds (ref 24–36)

## 2018-05-01 LAB — PROTIME-INR
INR: 1.02
Prothrombin Time: 13.3 seconds (ref 11.4–15.2)

## 2018-05-01 LAB — DIFFERENTIAL
Basophils Absolute: 0 10*3/uL (ref 0.0–0.1)
Basophils Relative: 0 %
Eosinophils Absolute: 0.1 10*3/uL (ref 0.0–0.7)
Eosinophils Relative: 2 %
Lymphocytes Relative: 27 %
Lymphs Abs: 2.1 10*3/uL (ref 0.7–4.0)
Monocytes Absolute: 0.7 10*3/uL (ref 0.1–1.0)
Monocytes Relative: 9 %
Neutro Abs: 4.8 10*3/uL (ref 1.7–7.7)
Neutrophils Relative %: 62 %

## 2018-05-01 NOTE — ED Triage Notes (Signed)
Pt c/o headache with n/v that started earlier today. Also reports right arm tingling sensations that started around 3pm today. Got a buzzing noise in his head.

## 2018-05-01 NOTE — ED Notes (Signed)
Pt has right arm sensation is more dull than left side.  C/o being dizzy.

## 2018-05-02 NOTE — ED Notes (Signed)
Pt turned in his stickers and left AMA

## 2018-05-04 DIAGNOSIS — F259 Schizoaffective disorder, unspecified: Secondary | ICD-10-CM | POA: Diagnosis not present

## 2018-05-04 DIAGNOSIS — F4310 Post-traumatic stress disorder, unspecified: Secondary | ICD-10-CM | POA: Diagnosis not present

## 2018-05-04 DIAGNOSIS — F1410 Cocaine abuse, uncomplicated: Secondary | ICD-10-CM | POA: Diagnosis not present

## 2018-05-04 DIAGNOSIS — G894 Chronic pain syndrome: Secondary | ICD-10-CM | POA: Diagnosis not present

## 2018-05-04 DIAGNOSIS — F332 Major depressive disorder, recurrent severe without psychotic features: Secondary | ICD-10-CM | POA: Diagnosis not present

## 2018-05-04 DIAGNOSIS — F602 Antisocial personality disorder: Secondary | ICD-10-CM | POA: Diagnosis not present

## 2018-05-04 DIAGNOSIS — R945 Abnormal results of liver function studies: Secondary | ICD-10-CM | POA: Diagnosis not present

## 2018-05-04 DIAGNOSIS — B1920 Unspecified viral hepatitis C without hepatic coma: Secondary | ICD-10-CM | POA: Diagnosis not present

## 2018-05-04 DIAGNOSIS — I10 Essential (primary) hypertension: Secondary | ICD-10-CM | POA: Diagnosis not present

## 2018-05-06 ENCOUNTER — Inpatient Hospital Stay (HOSPITAL_COMMUNITY)
Admission: AD | Admit: 2018-05-06 | Discharge: 2018-05-10 | DRG: 885 | Disposition: A | Discharge status: Home / Self Care | Payer: PPO | Source: Intra-hospital | Attending: Psychiatry | Admitting: Psychiatry

## 2018-05-06 ENCOUNTER — Encounter (HOSPITAL_COMMUNITY): Payer: Self-pay

## 2018-05-06 ENCOUNTER — Emergency Department (HOSPITAL_COMMUNITY)
Admission: EM | Admit: 2018-05-06 | Discharge: 2018-05-06 | Disposition: A | Discharge status: Other Acute Inpatient Hospital | Payer: PPO | Attending: Emergency Medicine | Admitting: Emergency Medicine

## 2018-05-06 ENCOUNTER — Encounter (HOSPITAL_COMMUNITY): Payer: Self-pay | Admitting: Emergency Medicine

## 2018-05-06 ENCOUNTER — Other Ambulatory Visit: Payer: Self-pay

## 2018-05-06 DIAGNOSIS — G4700 Insomnia, unspecified: Secondary | ICD-10-CM | POA: Diagnosis not present

## 2018-05-06 DIAGNOSIS — K219 Gastro-esophageal reflux disease without esophagitis: Secondary | ICD-10-CM | POA: Diagnosis present

## 2018-05-06 DIAGNOSIS — F4310 Post-traumatic stress disorder, unspecified: Secondary | ICD-10-CM | POA: Diagnosis present

## 2018-05-06 DIAGNOSIS — Z62810 Personal history of physical and sexual abuse in childhood: Secondary | ICD-10-CM | POA: Diagnosis present

## 2018-05-06 DIAGNOSIS — F419 Anxiety disorder, unspecified: Secondary | ICD-10-CM | POA: Diagnosis not present

## 2018-05-06 DIAGNOSIS — R748 Abnormal levels of other serum enzymes: Secondary | ICD-10-CM | POA: Diagnosis not present

## 2018-05-06 DIAGNOSIS — I252 Old myocardial infarction: Secondary | ICD-10-CM | POA: Diagnosis not present

## 2018-05-06 DIAGNOSIS — Z008 Encounter for other general examination: Secondary | ICD-10-CM | POA: Diagnosis not present

## 2018-05-06 DIAGNOSIS — G8929 Other chronic pain: Secondary | ICD-10-CM

## 2018-05-06 DIAGNOSIS — Z888 Allergy status to other drugs, medicaments and biological substances status: Secondary | ICD-10-CM | POA: Diagnosis not present

## 2018-05-06 DIAGNOSIS — Z818 Family history of other mental and behavioral disorders: Secondary | ICD-10-CM

## 2018-05-06 DIAGNOSIS — F410 Panic disorder [episodic paroxysmal anxiety] without agoraphobia: Secondary | ICD-10-CM | POA: Diagnosis not present

## 2018-05-06 DIAGNOSIS — G894 Chronic pain syndrome: Secondary | ICD-10-CM | POA: Diagnosis present

## 2018-05-06 DIAGNOSIS — F1120 Opioid dependence, uncomplicated: Secondary | ICD-10-CM | POA: Diagnosis present

## 2018-05-06 DIAGNOSIS — Z79899 Other long term (current) drug therapy: Secondary | ICD-10-CM | POA: Insufficient documentation

## 2018-05-06 DIAGNOSIS — R109 Unspecified abdominal pain: Secondary | ICD-10-CM

## 2018-05-06 DIAGNOSIS — Z23 Encounter for immunization: Secondary | ICD-10-CM

## 2018-05-06 DIAGNOSIS — R45851 Suicidal ideations: Secondary | ICD-10-CM | POA: Diagnosis present

## 2018-05-06 DIAGNOSIS — Z91040 Latex allergy status: Secondary | ICD-10-CM | POA: Diagnosis not present

## 2018-05-06 DIAGNOSIS — F17210 Nicotine dependence, cigarettes, uncomplicated: Secondary | ICD-10-CM | POA: Diagnosis not present

## 2018-05-06 DIAGNOSIS — F332 Major depressive disorder, recurrent severe without psychotic features: Secondary | ICD-10-CM | POA: Diagnosis not present

## 2018-05-06 DIAGNOSIS — F1410 Cocaine abuse, uncomplicated: Secondary | ICD-10-CM | POA: Diagnosis present

## 2018-05-06 DIAGNOSIS — Z886 Allergy status to analgesic agent status: Secondary | ICD-10-CM | POA: Diagnosis not present

## 2018-05-06 DIAGNOSIS — G40909 Epilepsy, unspecified, not intractable, without status epilepticus: Secondary | ICD-10-CM | POA: Diagnosis not present

## 2018-05-06 DIAGNOSIS — F319 Bipolar disorder, unspecified: Secondary | ICD-10-CM | POA: Diagnosis not present

## 2018-05-06 DIAGNOSIS — F329 Major depressive disorder, single episode, unspecified: Secondary | ICD-10-CM | POA: Diagnosis present

## 2018-05-06 LAB — RAPID URINE DRUG SCREEN, HOSP PERFORMED
Amphetamines: POSITIVE — AB
Barbiturates: NOT DETECTED
Benzodiazepines: NOT DETECTED
Cocaine: NOT DETECTED
Opiates: NOT DETECTED
Tetrahydrocannabinol: NOT DETECTED

## 2018-05-06 LAB — COMPREHENSIVE METABOLIC PANEL
ALT: 185 U/L — ABNORMAL HIGH (ref 0–44)
AST: 99 U/L — ABNORMAL HIGH (ref 15–41)
Albumin: 3.4 g/dL — ABNORMAL LOW (ref 3.5–5.0)
Alkaline Phosphatase: 80 U/L (ref 38–126)
Anion gap: 6 (ref 5–15)
BUN: 14 mg/dL (ref 6–20)
CO2: 25 mmol/L (ref 22–32)
Calcium: 9.3 mg/dL (ref 8.9–10.3)
Chloride: 107 mmol/L (ref 98–111)
Creatinine, Ser: 0.8 mg/dL (ref 0.61–1.24)
GFR calc Af Amer: >60 mL/min (ref >60)
GFR calc non Af Amer: >60 mL/min (ref >60)
Glucose, Bld: 108 mg/dL — ABNORMAL HIGH (ref 70–99)
Potassium: 3.9 mmol/L (ref 3.5–5.1)
Sodium: 138 mmol/L (ref 135–145)
Total Bilirubin: 1 mg/dL (ref 0.3–1.2)
Total Protein: 5.9 g/dL — ABNORMAL LOW (ref 6.5–8.1)

## 2018-05-06 LAB — CBC
HCT: 42.5 % (ref 39.0–52.0)
Hemoglobin: 14.5 g/dL (ref 13.0–17.0)
MCH: 32.2 pg (ref 26.0–34.0)
MCHC: 34.1 g/dL (ref 30.0–36.0)
MCV: 94.2 fL (ref 78.0–100.0)
Platelets: 168 10*3/uL (ref 150–400)
RBC: 4.51 MIL/uL (ref 4.22–5.81)
RDW: 13.4 % (ref 11.5–15.5)
WBC: 6 10*3/uL (ref 4.0–10.5)

## 2018-05-06 LAB — ACETAMINOPHEN LEVEL: Acetaminophen (Tylenol), Serum: <10 ug/mL — ABNORMAL LOW (ref 10–30)

## 2018-05-06 LAB — LIPASE, BLOOD: Lipase: 47 U/L (ref 11–51)

## 2018-05-06 LAB — SALICYLATE LEVEL: Salicylate Lvl: <7 mg/dL (ref 2.8–30.0)

## 2018-05-06 LAB — ETHANOL: Alcohol, Ethyl (B): <10 mg/dL (ref <10)

## 2018-05-06 MED ORDER — FAMOTIDINE 20 MG PO TABS
40.0000 mg | ORAL_TABLET | Freq: Once | ORAL | Status: AC
  Administered 2018-05-06: 40 mg via ORAL
  Filled 2018-05-06: qty 2

## 2018-05-06 MED ORDER — LOPERAMIDE HCL 2 MG PO CAPS: 2.0000 mg | ORAL_CAPSULE | ORAL | Status: DC | PRN

## 2018-05-06 MED ORDER — MAGNESIUM HYDROXIDE 400 MG/5ML PO SUSP: 30.0000 mL | Freq: Every day | ORAL | Status: DC | PRN

## 2018-05-06 MED ORDER — CLONIDINE HCL 0.1 MG PO TABS
0.1000 mg | ORAL_TABLET | Freq: Four times a day (QID) | ORAL | Status: AC
  Administered 2018-05-07 – 2018-05-08 (×8): 0.1 mg via ORAL
  Filled 2018-05-06 (×13): qty 1

## 2018-05-06 MED ORDER — METHADONE HCL 10 MG PO TABS
50.0000 mg | ORAL_TABLET | Freq: Every day | ORAL | Status: DC
  Administered 2018-05-06: 50 mg via ORAL
  Filled 2018-05-06: qty 5

## 2018-05-06 MED ORDER — METHOCARBAMOL 500 MG PO TABS
500.0000 mg | ORAL_TABLET | Freq: Three times a day (TID) | ORAL | Status: DC | PRN
  Administered 2018-05-06 – 2018-05-10 (×7): 500 mg via ORAL
  Filled 2018-05-06 (×8): qty 1

## 2018-05-06 MED ORDER — CLONIDINE HCL 0.1 MG PO TABS
0.1000 mg | ORAL_TABLET | ORAL | Status: AC
  Administered 2018-05-06 – 2018-05-10 (×4): 0.1 mg via ORAL
  Filled 2018-05-06 (×4): qty 1

## 2018-05-06 MED ORDER — ACETAMINOPHEN 325 MG PO TABS: 650.0000 mg | ORAL_TABLET | Freq: Four times a day (QID) | ORAL | Status: DC | PRN

## 2018-05-06 MED ORDER — MENTHOL 3 MG MT LOZG: 1.0000 | LOZENGE | OROMUCOSAL | Status: DC | PRN

## 2018-05-06 MED ORDER — INFLUENZA VAC SPLIT QUAD 0.5 ML IM SUSY
0.5000 mL | PREFILLED_SYRINGE | INTRAMUSCULAR | Status: AC
  Administered 2018-05-07: 0.5 mL via INTRAMUSCULAR
  Filled 2018-05-06: qty 0.5

## 2018-05-06 MED ORDER — ONDANSETRON 4 MG PO TBDP: 4.0000 mg | ORAL_TABLET | Freq: Four times a day (QID) | ORAL | Status: DC | PRN

## 2018-05-06 MED ORDER — FAMOTIDINE 20 MG PO TABS: 20.0000 mg | ORAL_TABLET | Freq: Two times a day (BID) | ORAL | Status: DC

## 2018-05-06 MED ORDER — DICYCLOMINE HCL 10 MG PO CAPS
10.0000 mg | ORAL_CAPSULE | Freq: Once | ORAL | Status: AC
  Administered 2018-05-06: 10 mg via ORAL
  Filled 2018-05-06: qty 1

## 2018-05-06 MED ORDER — DICYCLOMINE HCL 20 MG PO TABS: 20.0000 mg | ORAL_TABLET | Freq: Four times a day (QID) | ORAL | Status: DC | PRN

## 2018-05-06 MED ORDER — CLONIDINE HCL 0.1 MG PO TABS
0.1000 mg | ORAL_TABLET | Freq: Every day | ORAL | Status: DC
  Filled 2018-05-06 (×2): qty 1

## 2018-05-06 MED ORDER — NICOTINE 21 MG/24HR TD PT24
21.0000 mg | MEDICATED_PATCH | Freq: Every day | TRANSDERMAL | Status: DC
  Administered 2018-05-06 – 2018-05-10 (×5): 21 mg via TRANSDERMAL
  Filled 2018-05-06 (×9): qty 1

## 2018-05-06 MED ORDER — HYDROXYZINE HCL 25 MG PO TABS
25.0000 mg | ORAL_TABLET | Freq: Four times a day (QID) | ORAL | Status: DC | PRN
  Administered 2018-05-06 – 2018-05-08 (×4): 25 mg via ORAL
  Filled 2018-05-06 (×4): qty 1

## 2018-05-06 NOTE — ED Notes (Signed)
ED Provider at bedside. 

## 2018-05-06 NOTE — Progress Notes (Signed)
Referred pt to the following hospitals seeking inpatient treatment: [facility]  Also will be considered for admission to [facility] upon bed availability.  Ilean Skill, MSW, LCSW Clinical Social Work 05/06/2018 [phone]

## 2018-05-06 NOTE — ED Triage Notes (Signed)
Pt states, "I'm not suicidal but I am a risk to myself."  States [place] bridge for the last week until he came home today.  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  States his psych meds need adjusting.

## 2018-05-06 NOTE — ED Notes (Signed)
TTS being done at this time.  

## 2018-05-06 NOTE — ED Notes (Signed)
Pt noted to be calm, pleasant, cooperative.

## 2018-05-06 NOTE — ED Notes (Signed)
Pt given extra meal tray to eat. Pt was still hungry

## 2018-05-06 NOTE — Progress Notes (Signed)
TTS calling telepsych cart, no answer.  Sheli Dorin, MSW, LCSW Therapeutic Triage Specialist  [phone]  

## 2018-05-06 NOTE — Progress Notes (Signed)
Per Donell Sievert, PA pt meets criteria for inpt treatment. TTS to seek placement. Pt's nurse Chelsea, RN has been advised and states she will inform the EDP.   Princess Bruins, MSW, LCSW Therapeutic Triage Specialist  [phone]

## 2018-05-06 NOTE — Progress Notes (Signed)
Pt accepted to [facility] bed 403-1, attending Dr. Jama Flavors, accepted by Henri Medal NP.  Report [phone] Can arrive for admission 15:30 per [facility]  Ilean Skill, MSW, LCSW Clinical Social Work 05/06/2018 [phone]

## 2018-05-06 NOTE — ED Notes (Signed)
Pt changed into paper scrubs, belongings bagged and placed in [facility] #1, security called to wand pt, and staffing notified of [facility] sitter need.

## 2018-05-06 NOTE — ED Notes (Signed)
Pt ambulatory to F10 - wearing eyeglasses and burgundy scrubs. Sitter w/pt.

## 2018-05-06 NOTE — [facility] Group Notes (Signed)
Adult Psychoeducational Group Note  Date:  05/06/2018 Time:  9:20 PM  Group Topic/Focus:  Wrap-Up Group:   The focus of this group is to help patients review their daily goal of treatment and discuss progress on daily workbooks.  Participation Level:  Active  Participation Quality:  Appropriate and Attentive  Affect:  Appropriate  Cognitive:  Alert and Appropriate  Insight: Appropriate and Good  Engagement in Group:  Engaged  Modes of Intervention:  Discussion and Education  Additional Comments:  Pt attended and participated in wrap up rgoup this evening. Pt rated their day a 9/10 due to them coming here with a positive mind and being clean "for once". Pt goal is to get their meds regulated.  Chrisandra Netters 05/06/2018, 9:20 PM

## 2018-05-06 NOTE — ED Provider Notes (Addendum)
TIME SEEN: 5:42 AM  CHIEF COMPLAINT: "I do not think that I am safe"  HPI: Patient is a 47 year old male with history of drug-seeking behavior, heroin abuse who states he is no longer on methadone, PTSD, chronic back pain, hepatitis C who presents to the emergency department stating that he had a episode of "dissociation" where he was [place].  He states when he has these episodes he is worried that he may hurt himself or others.  Denies SI or HI at this time but feels he would benefit talking to psychiatry.  Also complaining of chronic right upper quadrant abdominal pain.  Has had CT scan on 04/15/2018 at [facility] that showed no acute abnormality.  Records in care everywhere.  Also reports intermittent vomiting and diarrhea.  No fever.  ROS: See HPI Constitutional: no fever  Eyes: no drainage  ENT: no runny nose   Cardiovascular:  no chest pain  Resp: no SOB  GI: no vomiting GU: no dysuria Integumentary: no rash  Allergy: no hives  Musculoskeletal: no leg swelling  Neurological: no slurred speech ROS otherwise negative  PAST MEDICAL HISTORY/PAST SURGICAL HISTORY:  Past Medical History:  Diagnosis Date  . Accidental heroin overdose (HCC)   . Anxiety   . Back pain   . Bipolar 1 disorder (HCC)   . Chronic back pain   . Depression   . Drug-seeking behavior   . GERD (gastroesophageal reflux disease)   . Heroin abuse (HCC)   . History of ETOH abuse   . Myocardial infarction [facility])    Pt says that the doctor determined it was panic attacks  . Opioid dependence (HCC)   . PTSD (post-traumatic stress disorder)   . Seizures (HCC)    alcohol induced- pt reports sober for 6 months    MEDICATIONS:  Prior to Admission medications   Medication Sig Start Date End Date Taking? Authorizing Provider  albuterol (PROVENTIL HFA;VENTOLIN HFA) 108 (90 Base) MCG/ACT inhaler Inhale 2 puffs into the lungs every 6 (six) hours as needed for wheezing or shortness of breath.    [provider]  DOCUSATE SODIUM PO Take 3 capsules by mouth daily.    [provider]  doxepin (SINEQUAN) 50 MG capsule Take 1 capsule (50 mg total) by mouth at bedtime. 03/17/18   Truman Hayward, FNP  METHADONE HCL PO Take 75 mg by mouth daily.     [provider]  nicotine (NICODERM CQ - DOSED IN MG/24 HOURS) 21 mg/24hr patch Place 1 patch (21 mg total) onto the skin daily. 03/18/18   Truman Hayward, FNP  pantoprazole (PROTONIX) 40 MG tablet Take 1 tablet (40 mg total) by mouth daily. 12/24/17   Truman Hayward, FNP  prazosin (MINIPRESS) 2 MG capsule Take 1 capsule (2 mg total) by mouth at bedtime. 03/17/18   Truman Hayward, FNP  sertraline (ZOLOFT) 100 MG tablet Take 1 tablet (100 mg total) by mouth daily. 03/18/18   Truman Hayward, FNP    ALLERGIES:  Allergies  Allergen Reactions  . Benadryl [Diphenhydramine Hcl] Anaphylaxis and Other (See Comments)    Muscles lock up.  . Celecoxib Diarrhea, Rash and Other (See Comments)    Nose bleeds, bloating, migraines   . Diphenhydramine Anaphylaxis    Hard time breathing, convulsions,  . Prednisone Other (See Comments)    Severe Anger  . Risperidone And Related Other (See Comments)    Overly sedates  . Nsaids Diarrhea, Rash and Other (See  Comments)    Nose bleeds, bloating, migraines  . Other Other (See Comments)    STEROIDS--caused anger,irritation  . Rofecoxib Diarrhea, Rash and Other (See Comments)    Nose bleeds, bloating, migraines  . Tolmetin Diarrhea, Rash and Other (See Comments)    Nose bleeds, bloating, migraines   . Trazodone And Nefazodone Other (See Comments)    headaches  . Ibuprofen Rash  . Latex Itching, Rash and Other (See Comments)    Skin cracks  . Paliperidone Palmitate Er Other (See Comments)    Overly sedates    SOCIAL HISTORY:  Social History   Tobacco Use  . Smoking status: Current Every Day Smoker    Packs/day: 1.50    Types: Cigarettes  . Smokeless tobacco: Never Used  Substance  Use Topics  . Alcohol use: Not Currently    Comment: Pt reports sober for 6 months    FAMILY HISTORY: Family History  Problem Relation Age of Onset  . Hypertension Mother   . CAD Father   . COPD Father   . Stroke Father   . Testicular cancer Brother     EXAM: BP 134/75 (BP Location: Right Arm)   Pulse 66   Temp 98.8 F (37.1 C) (Oral)   Resp 18   SpO2 99%  CONSTITUTIONAL: Alert and oriented and responds appropriately to questions. Well-appearing; well-nourished HEAD: Normocephalic EYES: Conjunctivae clear, pupils appear equal, EOMI ENT: normal nose; moist mucous membranes NECK: Supple, no meningismus, no nuchal rigidity, no LAD  CARD: RRR; S1 and S2 appreciated; no murmurs, no clicks, no rubs, no gallops RESP: Normal chest excursion without splinting or tachypnea; breath sounds clear and equal bilaterally; no wheezes, no rhonchi, no rales, no hypoxia or respiratory distress, speaking full sentences ABD/GI: Normal bowel sounds; non-distended; soft, non-tender, no rebound, no guarding, no peritoneal signs, no hepatosplenomegaly, abdominal exam completely benign when distracted BACK:  The back appears normal and is non-tender to palpation, there is no CVA tenderness EXT: Normal ROM in all joints; non-tender to palpation; no edema; normal capillary refill; no cyanosis, no calf tenderness or swelling    SKIN: Normal color for age and race; warm; no rash NEURO: Moves all extremities equally PSYCH: She states that he worries that he is a danger to himself and others but denies active SI or HI.  No hallucinations.  MEDICAL DECISION MAKING: Patient here requesting psychiatric evaluation.  States he thinks he is a danger to himself and others when he becomes "dissociated".  Reports history of PTSD.  Not on psychiatric medication at this time.  Also complains of chronic abdominal pain and has had negative outpatient work-up.  States he has plans to follow-up with GI as an outpatient.  LFTs  mildly elevated today but this appears to be his baseline.  Abdominal exam is completely benign.  I do not feel he needs further emergent work-up for this in the ED today.  At this time he is medically cleared.  We will consult TTS.  ED PROGRESS: His medical work-up is unremarkable.  Drug screen positive for amphetamines.  He denies history of drug abuse and states he is not on any prescription medications at this time.  Has documented history of substance abuse.  Awaiting TTS evaluation for further disposition.   TTS has recommended inpatient psychiatric treatment.  They will look for placement.   I reviewed all nursing notes, vitals, pertinent previous records, EKGs, lab and urine results, imaging (as available).      Ward, Layla Maw,  DO 05/06/18 0625    Ward, Layla Maw, DO 05/06/18 [phone]

## 2018-05-06 NOTE — ED Notes (Signed)
Patient reports that the provider told him he was positive for methamphetamines on his UDS, pt stated he has not done any meth, states that he has used drugs in the past but never methamphetamines and was concerned why this showed up in his urine. Patient also reports he is not currently on any medications at this time.

## 2018-05-06 NOTE — ED Notes (Signed)
Pt is in agreement with treatment plan. Accepted to [facility], pt signed consent form, copied and faxed to [facility], copy sent to medical records and original placed in folder for [facility].

## 2018-05-06 NOTE — Tx Team (Signed)
Initial Treatment Plan 05/06/2018 5:56 PM Scott [name] ZOX:[id number]    PATIENT STRESSORS: Health problems Medication change or noncompliance   PATIENT STRENGTHS: Ability for insight Active sense of humor Average or above average intelligence Communication skills Motivation for treatment/growth Supportive family/friends   PATIENT IDENTIFIED PROBLEMS: "I want to get my medications straight."  "anger management"  "self control"                 DISCHARGE CRITERIA:  Improved stabilization in mood, thinking, and/or behavior  PRELIMINARY DISCHARGE PLAN: Attend 12-step recovery group Outpatient therapy  PATIENT/FAMILY INVOLVEMENT: This treatment plan has been presented to and reviewed with the patient, Scott [name].  The patient and family have been given the opportunity to ask questions and make suggestions.  Maurine Simmering, RN 05/06/2018, 5:56 PM

## 2018-05-06 NOTE — ED Notes (Signed)
Pt called family and advised tx plan.

## 2018-05-06 NOTE — Progress Notes (Signed)
Note says "TTS being done." TTS is currently calling cart 1 to do the assessment but no answer. TTS will call pt's nurse.  Princess Bruins, MSW, LCSW Therapeutic Triage Specialist  [phone]

## 2018-05-06 NOTE — ED Notes (Signed)
Pt is wearing his prescription glasses.

## 2018-05-06 NOTE — Progress Notes (Signed)
TTS consult ordered for pt in the hallway. Spoke with Raquel James, RN who states she will secure a private room for the pt in order to conduct TTS assessment. Requested this writer call back in 5 minutes.   Princess Bruins, MSW, LCSW Therapeutic Triage Specialist  [phone]
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
## 2018-05-07 DIAGNOSIS — F419 Anxiety disorder, unspecified: Secondary | ICD-10-CM

## 2018-05-07 DIAGNOSIS — F17210 Nicotine dependence, cigarettes, uncomplicated: Secondary | ICD-10-CM

## 2018-05-07 DIAGNOSIS — F332 Major depressive disorder, recurrent severe without psychotic features: Principal | ICD-10-CM

## 2018-05-07 MED ORDER — GABAPENTIN 100 MG PO CAPS
100.0000 mg | ORAL_CAPSULE | Freq: Three times a day (TID) | ORAL | Status: DC
  Administered 2018-05-07 – 2018-05-10 (×9): 100 mg via ORAL
  Filled 2018-05-07 (×18): qty 1

## 2018-05-07 NOTE — Plan of Care (Signed)
Nurse discussed anxiety, depression, coping skills with patient. 

## 2018-05-07 NOTE — [facility] Group Notes (Signed)
Pt did not attend RN psychoeducational group. 

## 2018-05-07 NOTE — Progress Notes (Signed)
Writer observed patient up in the dayroom watching tv and attended group. He reports that he decided to stop taking methadone and come off it completely while here. He reports feeling a little more tired today than usual and requested his medications early to go to bed. Support given and safety maintained on unit with 15 min checks.

## 2018-05-07 NOTE — Progress Notes (Signed)
Writer spoke with patient 1:1 and he reports being admitted to the unit this afternoon. He has been up in the dayroom interacting with select peers. He reports that his goal is to work on his anger management. Support given and safety maintained on unit with 15 min checks.
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
## 2018-05-07 NOTE — Progress Notes (Addendum)
D:  Patient's self inventory sheet, patient sleeps good, no sleep medictiton.  Good appetite, high energy level, poor concentration.  Rated depression 4, denied hopeless, anxiety 8.  Denied withdrawals.  Deieid SI.  Physical problems, pain, headaches, blurred vision.  Physical pain, lower lumber R leg, worst pain #7 in past 24 hours.  Pain medication helpful.  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  Plans to attend group therapy  No discharge plans. A:  Medications administered per MD orders.  Emotional support and encouragement given patient. R:  Patient denied SI and HI, contracts for safety.  Denied A/V hallucinations.  Safety maintained with 15 minute checks. Patient stated he did not need cepacol lozenges at this time.

## 2018-05-07 NOTE — [facility] Group Notes (Signed)
[facility] LCSW Group Therapy Note  Date/Time:  05/07/2018  10:00-11:00AM  Type of Therapy and Topic:  Group Therapy:  Healthy and Unhealthy Supports  Participation Level:  Active   Description of Group:  Patients in this group were introduced to the idea of adding a variety of healthy supports to address the various needs in their lives.Patients discussed what additional healthy supports could be helpful in their recovery and wellness after discharge in order to prevent future hospitalizations.   An emphasis was placed on using counselor, doctor, therapy groups, 12-step groups, and problem-specific support groups to expand supports.  They also worked as a group on developing a specific plan for several patients to deal with unhealthy supports through boundary-setting, psychoeducation with loved ones, and even termination of relationships.   Therapeutic Goals:   1)  discuss importance of adding supports to stay well once out of the hospital  2)  compare healthy versus unhealthy supports and identify some examples of each  3)  generate ideas and descriptions of healthy supports that can be added  4)  offer mutual support about how to address unhealthy supports  5)  encourage active participation in and adherence to discharge plan    Summary of Patient Progress:  The patient stated that current healthy supports in his life is his wife who he  described once was an Scientist, forensic for him. He did not identify an unhealthy support but said that he needed to change his thinking and recognize that he was worthy of being supported.  The patient expressed that he will keep 12-step groups and professional helper as supports to help in his recovery journey.   Therapeutic Modalities:   Motivational Interviewing Brief Solution-Focused Therapy  Evorn Gong

## 2018-05-07 NOTE — Progress Notes (Signed)
Adult Psychoeducational Group Note  Date:  05/07/2018 Time:  3:59 PM  Group Topic/Focus:  Conflict Resolution:   The focus of this group is to discuss the conflict resolution process and how it may be used upon discharge.  Participation Level:  Active  Participation Quality:  Appropriate  Affect:  Appropriate  Cognitive:  Alert  Insight: Appropriate  Engagement in Group:  Engaged  Modes of Intervention:  Discussion and Education  Additional Comments:  Pt stated that he would like the MD to figure out the right medication that would allow him to become stable.  Pax Reasoner E 05/07/2018, 3:59 PM
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
## 2018-05-07 NOTE — [facility] Group Notes (Signed)
Adult Psychoeducational Group Note  Date:  05/07/2018 Time:  9:45 PM  Group Topic/Focus:  Wrap-Up Group:   The focus of this group is to help patients review their daily goal of treatment and discuss progress on daily workbooks.  Participation Level:  Active  Participation Quality:  Appropriate and Attentive  Affect:  Appropriate  Cognitive:  Alert and Appropriate  Insight: Appropriate and Good  Engagement in Group:  Engaged  Modes of Intervention:  Discussion and Education  Additional Comments:  Pt attended and participated in wrap up group this evening. Pt rated their day a 9/10 due to them being proud of themselves for being clean. Pt goal was to get information on anger management therapy for their post care.   Chrisandra Netters 05/07/2018, 9:45 PM

## 2018-05-08 MED ORDER — SERTRALINE HCL 50 MG PO TABS
50.0000 mg | ORAL_TABLET | Freq: Every day | ORAL | Status: DC
  Administered 2018-05-09 – 2018-05-10 (×2): 50 mg via ORAL
  Filled 2018-05-08 (×4): qty 1

## 2018-05-08 NOTE — Progress Notes (Signed)
D:  Patient's self inventory sheet, patient sleeps good, no sleep medication given.  Good appetite, high energy level, good concentration.  Rated depression 4, denied hopeless anxiety 7.  Denied withdrawals.  Denied SI.  Physical problems, pain, headaches.  Physical pain, lower lumber R leg, no pain medicine.  Goal is anger medicine.  Plans groups.  No discharge plans. A:  Medications administered per MD orders.  Emotional support and encouragement given patient. R:  Denied SI and HI, contracts for safety.  Denied A/V hallucinations.  Safety maintained with 15 minute checks.

## 2018-05-08 NOTE — [facility] Group Notes (Signed)
[facility] Group Notes:  (Nursing/MHT/Case Management/Adjunct)  Date:  05/08/2018  Time:  4:00 pm  Type of Therapy:  Psychoeducational Skills  Participation Level:  Active  Participation Quality:  Appropriate  Affect:  Appropriate  Cognitive:  Appropriate  Insight:  Appropriate  Engagement in Group:  Engaged  Modes of Intervention:  Education  Summary of Progress/Problems: Patient attended group and interacted appropriately.    Scott [name] 05/08/2018, 6:50 PM

## 2018-05-08 NOTE — Progress Notes (Signed)
Pt attended morning group. 

## 2018-05-08 NOTE — Plan of Care (Signed)
Nurse discussed anxiety, depression, and coping skills with patient.  
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
## 2018-05-08 NOTE — Progress Notes (Signed)
Recreation Therapy Notes  Date: 9.30.19 Time: 0930 Location: [address] Dayroom  Group Topic: Stress Management  Goal Area(s) Addresses:  Patient will verbalize importance of using healthy stress management.  Patient will identify positive emotions associated with healthy stress management.   Intervention: Stress Management  Activity :  Guided Imagery.  LRT introduced the stress management technique of guided imagery.  LRT read a script that guided patients a walk through a wildlife sanctuary.  Patients were to listen and follow along as the script was read.  Education:  Stress Management, Discharge Planning.   Education Outcome: Acknowledges edcuation/In group clarification offered/Needs additional education  Clinical Observations/Feedback: Pt did not attend group.     Caroll Rancher, LRT/CTRS         Caroll Rancher A 05/08/2018 11:42 AM

## 2018-05-08 NOTE — [facility] Group Notes (Signed)
[facility] LCSW Group Therapy Note  Date/Time: 05/08/18, 1315  Type of Therapy and Topic:  Group Therapy:  Overcoming Obstacles  Participation Level:  active  Description of Group:    In this group patients will be encouraged to explore what they see as obstacles to their own wellness and recovery. They will be guided to discuss their thoughts, feelings, and behaviors related to these obstacles. The group will process together ways to cope with barriers, with attention given to specific choices patients can make. Each patient will be challenged to identify changes they are motivated to make in order to overcome their obstacles. This group will be process-oriented, with patients participating in exploration of their own experiences as well as giving and receiving support and challenge from other group members.  Therapeutic Goals: 1. Patient will identify personal and current obstacles as they relate to admission. 2. Patient will identify barriers that currently interfere with their wellness or overcoming obstacles.  3. Patient will identify feelings, thought process and behaviors related to these barriers. 4. Patient will identify two changes they are willing to make to overcome these obstacles:    Summary of Patient Progress:  Pt shared that health issues and medication issues are current obstacles.  Pt active during group discussion regarding positive ways to overcome obstacles.        Therapeutic Modalities:   Cognitive Behavioral Therapy Solution Focused Therapy Motivational Interviewing Relapse Prevention Therapy  Daleen Squibb, LCSW

## 2018-05-08 NOTE — Tx Team (Signed)
Interdisciplinary Treatment and Diagnostic Plan Update  05/08/2018 Time of Session: 1040 [facility] MRN: [id number]  Principal Diagnosis: MDD (major depressive disorder), recurrent severe, without psychosis (HCC)  Secondary Diagnoses: Principal Problem:   MDD (major depressive disorder), recurrent severe, without psychosis (HCC) Active Problems:   Elevated liver enzymes   MDD (major depressive disorder)   Current Medications:  Current Facility-Administered Medications  Medication Dose Route Frequency Provider Last Rate Last Dose  . acetaminophen (TYLENOL) tablet 650 mg  650 mg Oral Q6H PRN Oneta Rack, NP      . cloNIDine (CATAPRES) tablet 0.1 mg  0.1 mg Oral QID Oneta Rack, NP   0.1 mg at 05/08/18 0733   Followed by  . [START ON 05/09/2018] cloNIDine (CATAPRES) tablet 0.1 mg  0.1 mg Oral Esperanza Heir, NP   0.1 mg at 05/06/18 2131   Followed by  . [START ON 05/11/2018] cloNIDine (CATAPRES) tablet 0.1 mg  0.1 mg Oral QAC breakfast Oneta Rack, NP      . dicyclomine (BENTYL) tablet 20 mg  20 mg Oral Q6H PRN Oneta Rack, NP      . gabapentin (NEURONTIN) capsule 100 mg  100 mg Oral TID Cobos, Rockey Situ, MD   100 mg at 05/08/18 0735  . hydrOXYzine (ATARAX/VISTARIL) tablet 25 mg  25 mg Oral Q6H PRN Oneta Rack, NP   25 mg at 05/07/18 2104  . loperamide (IMODIUM) capsule 2-4 mg  2-4 mg Oral PRN Oneta Rack, NP      . magnesium hydroxide (MILK OF MAGNESIA) suspension 30 mL  30 mL Oral Daily PRN Oneta Rack, NP      . menthol-cetylpyridinium (CEPACOL) lozenge 3 mg  1 lozenge Oral PRN Donell Sievert E, PA-C      . methocarbamol (ROBAXIN) tablet 500 mg  500 mg Oral Q8H PRN Oneta Rack, NP   500 mg at 05/08/18 0746  . nicotine (NICODERM CQ - dosed in mg/24 hours) patch 21 mg  21 mg Transdermal Daily Cobos, Rockey Situ, MD   21 mg at 05/08/18 0736  . ondansetron (ZOFRAN-ODT) disintegrating tablet 4 mg  4 mg Oral Q6H PRN Oneta Rack, NP       PTA  Medications: Medications Prior to Admission  Medication Sig Dispense Refill Last Dose  . methadone (DOLOPHINE) 10 MG tablet Take 50 mg by mouth daily.   05/05/2018 at Unknown time  . Multiple Vitamin (MULTIVITAMIN WITH MINERALS) TABS tablet Take 1 tablet by mouth daily.   Past Week at Unknown time    Patient Stressors: Health problems Medication change or noncompliance  Patient Strengths: Ability for insight Active sense of humor Average or above average intelligence Communication skills Motivation for treatment/growth Supportive family/friends  Treatment Modalities: Medication Management, Group therapy, Case management,  1 to 1 session with clinician, Psychoeducation, Recreational therapy.   Physician Treatment Plan for Primary Diagnosis: MDD (major depressive disorder), recurrent severe, without psychosis (HCC) Long Term Goal(s): Improvement in symptoms so as ready for discharge Improvement in symptoms so as ready for discharge   Short Term Goals: Ability to identify changes in lifestyle to reduce recurrence of condition will improve Ability to verbalize feelings will improve Ability to disclose and discuss suicidal ideas Ability to demonstrate self-control will improve Ability to identify and develop effective coping behaviors will improve Ability to maintain clinical measurements within normal limits will improve Compliance with prescribed medications will improve Ability to identify triggers associated with substance  abuse/mental health issues will improve Ability to identify changes in lifestyle to reduce recurrence of condition will improve Ability to verbalize feelings will improve Ability to disclose and discuss suicidal ideas Ability to demonstrate self-control will improve Ability to identify and develop effective coping behaviors will improve Ability to maintain clinical measurements within normal limits will improve Compliance with prescribed medications will  improve Ability to identify triggers associated with substance abuse/mental health issues will improve  Medication Management: Evaluate patient's response, side effects, and tolerance of medication regimen.  Therapeutic Interventions: 1 to 1 sessions, Unit Group sessions and Medication administration.  Evaluation of Outcomes: Progressing  Physician Treatment Plan for Secondary Diagnosis: Principal Problem:   MDD (major depressive disorder), recurrent severe, without psychosis (HCC) Active Problems:   Elevated liver enzymes   MDD (major depressive disorder)  Long Term Goal(s): Improvement in symptoms so as ready for discharge Improvement in symptoms so as ready for discharge   Short Term Goals: Ability to identify changes in lifestyle to reduce recurrence of condition will improve Ability to verbalize feelings will improve Ability to disclose and discuss suicidal ideas Ability to demonstrate self-control will improve Ability to identify and develop effective coping behaviors will improve Ability to maintain clinical measurements within normal limits will improve Compliance with prescribed medications will improve Ability to identify triggers associated with substance abuse/mental health issues will improve Ability to identify changes in lifestyle to reduce recurrence of condition will improve Ability to verbalize feelings will improve Ability to disclose and discuss suicidal ideas Ability to demonstrate self-control will improve Ability to identify and develop effective coping behaviors will improve Ability to maintain clinical measurements within normal limits will improve Compliance with prescribed medications will improve Ability to identify triggers associated with substance abuse/mental health issues will improve     Medication Management: Evaluate patient's response, side effects, and tolerance of medication regimen.  Therapeutic Interventions: 1 to 1 sessions, Unit Group  sessions and Medication administration.  Evaluation of Outcomes: Progressing   RN Treatment Plan for Primary Diagnosis: MDD (major depressive disorder), recurrent severe, without psychosis (HCC) Long Term Goal(s): Knowledge of disease and therapeutic regimen to maintain health will improve  Short Term Goals: Ability to identify and develop effective coping behaviors will improve and Compliance with prescribed medications will improve  Medication Management: RN will administer medications as ordered by provider, will assess and evaluate patient's response and provide education to patient for prescribed medication. RN will report any adverse and/or side effects to prescribing provider.  Therapeutic Interventions: 1 on 1 counseling sessions, Psychoeducation, Medication administration, Evaluate responses to treatment, Monitor vital signs and CBGs as ordered, Perform/monitor CIWA, COWS, AIMS and Fall Risk screenings as ordered, Perform wound care treatments as ordered.  Evaluation of Outcomes: Progressing   LCSW Treatment Plan for Primary Diagnosis: MDD (major depressive disorder), recurrent severe, without psychosis (HCC) Long Term Goal(s): Safe transition to appropriate next level of care at discharge, Engage patient in therapeutic group addressing interpersonal concerns.  Short Term Goals: Engage patient in aftercare planning with referrals and resources, Increase social support and Increase skills for wellness and recovery  Therapeutic Interventions: Assess for all discharge needs, 1 to 1 time with Social worker, Explore available resources and support systems, Assess for adequacy in community support network, Educate family and significant other(s) on suicide prevention, Complete Psychosocial Assessment, Interpersonal group therapy.  Evaluation of Outcomes: Progressing   Progress in Treatment: Attending groups: Yes. Participating in groups: Yes. Taking medication as prescribed:  Yes. Toleration medication: Yes.  Family/Significant other contact made: No, will contact:  wife Patient understands diagnosis: Yes. Discussing patient identified problems/goals with staff: Yes. Medical problems stabilized or resolved: Yes. Denies suicidal/homicidal ideation: Yes. Issues/concerns per patient self-inventory: No. Other: none  New problem(s) identified: No, Describe:  none  New Short Term/Long Term Goal(s):  Patient Goals:  "get medication together"  Discharge Plan or Barriers:   Reason for Continuation of Hospitalization: Mania Medication stabilization  Estimated Length of Stay: 2-4 days.  Attendees: Patient: Scott [name] 05/08/2018   Physician: Dr. Jama Flavors, MD 05/08/2018   Nursing: Silverio Lay, RN 05/08/2018   RN Care Manager: 05/08/2018   Social Worker: Daleen Squibb, LCSW 05/08/2018   Recreational Therapist:  05/08/2018   Other:  05/08/2018   Other:  05/08/2018   Other: 05/08/2018        Scribe for Treatment Team: Lorri Frederick, LCSW 05/08/2018 11:05 AM

## 2018-05-08 NOTE — Progress Notes (Addendum)
[facility] MD Progress Note  05/08/2018 5:05 PM Scott [name]  MRN:  [id number] Subjective:   Currently feeling " better".  Denies medication side effects. Denies suicidal ideations. At this time does not endorse opiate WDL symptoms.  Objective : I have discussed case with treatment team and have met with patient.  47 year old, presented to ED following what he describes as a possible dissociative amnesia episode- reports " I lost two or three days that I can remember very little of". Reports history of prior similar episode during a period of increased stress. History of prior diagnosis of Bipolar Disorder, PTSD, and history of Substance Abuse ( Cocaine, ETOH, was on methadone maintenance). At this time not presenting with any symptoms of opiate WDL. He does not endorse aches,pains, nausea or diarrhea, and appears calm and in no acute distress .  Behavior on unit appropriate, pleasant on approach, visible in day room, going to groups. Denies medication side effects. We reviewed medication management options in the past, reports history of good response to Zoloft and Buspar. Reports PTSD associated nightmares improved partially with Minipress. Denies suicidal ideations.  Principal Problem: MDD (major depressive disorder), recurrent severe, without psychosis ([place]) Diagnosis:   Patient Active Problem List   Diagnosis Date Noted  . MDD (major depressive disorder), recurrent episode, severe ([place]) [F33.2] 05/06/2018  . MDD (major depressive disorder) [F32.9] 05/06/2018  . Elevated liver enzymes [R74.8] 03/13/2018  . MDD (major depressive disorder), recurrent severe, without psychosis ([place]) [F33.2] 03/06/2018  . MDD (major depressive disorder), severe ([name]) [F32.2] 12/16/2017  . Schizoaffective disorder ([place]) [F25.9] 06/07/2017  . Alcohol dependence with alcohol-induced mood disorder ([place]) [F10.24] 07/24/2015  . Alcohol withdrawal ([place]) [F10.239] 07/24/2015  . Cocaine use disorder, mild, abuse ([place])  [F14.10] 07/24/2015  . Substance induced mood disorder ([place]) [F19.94] 07/22/2015  . Drug-seeking behavior [Z76.5] 02/16/2015  . PTSD (post-traumatic stress disorder) [F43.10] 12/26/2014  . Opioid type dependence, continuous ([place]) [F11.20] 12/26/2014  . Major depressive disorder, recurrent episode, moderate (HCC) [F33.1]   . Chest pain at rest [R07.9] 10/22/2013  . Chronic pain syndrome [G89.4] 10/22/2013  . Seizure disorder ([place]) [M22.633] 10/22/2013  . Chest pain [R07.9] 10/21/2013   Total Time spent with patient: 20 minutes  Past Psychiatric History:   Past Medical History:  Past Medical History:  Diagnosis Date  . Accidental heroin overdose ([place])   . Anxiety   . Back pain   . Bipolar 1 disorder ([place])   . Chronic back pain   . Depression   . Drug-seeking behavior   . GERD (gastroesophageal reflux disease)   . Heroin abuse ([place])   . History of ETOH abuse   . Myocardial infarction [facility])    Pt says that the doctor determined it was panic attacks  . Opioid dependence ([place])   . PTSD (post-traumatic stress disorder)   . Seizures ([place])    alcohol induced- pt reports sober for 6 months    Past Surgical History:  Procedure Laterality Date  . ABDOMINAL SURGERY    . APPENDECTOMY    . BACK SURGERY     Family History:  Family History  Problem Relation Age of Onset  . Hypertension Mother   . CAD Father   . COPD Father   . Stroke Father   . Testicular cancer Brother    Family Psychiatric  History:  Social History:  Social History   Substance and Sexual Activity  Alcohol Use Not Currently   Comment: Pt reports sober for 6  months     Social History   Substance and Sexual Activity  Drug Use Not Currently  . Types: Methamphetamines, Marijuana, IV, Cocaine   Comment: Pt says not sure why positive for amphetamines    Social History   Socioeconomic History  . Marital status: Married    Spouse name: Not on file  . Number of children: Not on file  . Years of  education: Not on file  . Highest education level: Not on file  Occupational History  . Not on file  Social Needs  . Financial resource strain: Not on file  . Food insecurity:    Worry: Not on file    Inability: Not on file  . Transportation needs:    Medical: Not on file    Non-medical: Not on file  Tobacco Use  . Smoking status: Current Every Day Smoker    Packs/day: 1.50    Types: Cigarettes  . Smokeless tobacco: Never Used  Substance and Sexual Activity  . Alcohol use: Not Currently    Comment: Pt reports sober for 6 months  . Drug use: Not Currently    Types: Methamphetamines, Marijuana, IV, Cocaine    Comment: Pt says not sure why positive for amphetamines  . Sexual activity: Yes    Birth control/protection: None    Comment: heroin  Lifestyle  . Physical activity:    Days per week: Not on file    Minutes per session: Not on file  . Stress: Not on file  Relationships  . Social connections:    Talks on phone: Not on file    Gets together: Not on file    Attends religious service: Not on file    Active member of club or organization: Not on file    Attends meetings of clubs or organizations: Not on file    Relationship status: Not on file  Other Topics Concern  . Not on file  Social History Narrative  . Not on file   Additional Social History:   Sleep: Good- reports he did not have any nightmares last night  Appetite:  Good  Current Medications: Current Facility-Administered Medications  Medication Dose Route Frequency Provider Last Rate Last Dose  . acetaminophen (TYLENOL) tablet 650 mg  650 mg Oral Q6H PRN Derrill Center, NP      . cloNIDine (CATAPRES) tablet 0.1 mg  0.1 mg Oral QID Derrill Center, NP   0.1 mg at 05/08/18 1211   Followed by  . [START ON 05/09/2018] cloNIDine (CATAPRES) tablet 0.1 mg  0.1 mg Oral Bennye Alm, NP   0.1 mg at 05/06/18 2131   Followed by  . [START ON 05/11/2018] cloNIDine (CATAPRES) tablet 0.1 mg  0.1 mg Oral  QAC breakfast Derrill Center, NP      . dicyclomine (BENTYL) tablet 20 mg  20 mg Oral Q6H PRN Derrill Center, NP      . gabapentin (NEURONTIN) capsule 100 mg  100 mg Oral TID Cobos, Myer Peer, MD   100 mg at 05/08/18 1211  . hydrOXYzine (ATARAX/VISTARIL) tablet 25 mg  25 mg Oral Q6H PRN Derrill Center, NP   25 mg at 05/08/18 1401  . loperamide (IMODIUM) capsule 2-4 mg  2-4 mg Oral PRN Derrill Center, NP      . magnesium hydroxide (MILK OF MAGNESIA) suspension 30 mL  30 mL Oral Daily PRN Derrill Center, NP      . menthol-cetylpyridinium (CEPACOL) lozenge 3 mg  1 lozenge Oral PRN Laverle Hobby, PA-C      . methocarbamol (ROBAXIN) tablet 500 mg  500 mg Oral Q8H PRN Derrill Center, NP   500 mg at 05/08/18 0746  . nicotine (NICODERM CQ - dosed in mg/24 hours) patch 21 mg  21 mg Transdermal Daily Cobos, Myer Peer, MD   21 mg at 05/08/18 0736  . ondansetron (ZOFRAN-ODT) disintegrating tablet 4 mg  4 mg Oral Q6H PRN Derrill Center, NP        Lab Results: No results found for this or any previous visit (from the past 26 hour(s)).  Blood Alcohol level:  Lab Results  Component Value Date   ETH <10 05/06/2018   ETH <10 75/91/6384    Metabolic Disorder Labs: No results found for: HGBA1C, MPG No results found for: PROLACTIN No results found for: CHOL, TRIG, HDL, CHOLHDL, VLDL, LDLCALC  Physical Findings: AIMS: Facial and Oral Movements Muscles of Facial Expression: None, normal Lips and Perioral Area: None, normal Jaw: None, normal Tongue: None, normal,Extremity Movements Upper (arms, wrists, hands, fingers): None, normal Lower (legs, knees, ankles, toes): None, normal, Trunk Movements Neck, shoulders, hips: None, normal, Overall Severity Severity of abnormal movements (highest score from questions above): None, normal Incapacitation due to abnormal movements: None, normal Patient's awareness of abnormal movements (rate only patient's report): No Awareness, Dental Status Current  problems with teeth and/or dentures?: No Does patient usually wear dentures?: No  CIWA:  CIWA-Ar Total: 1 COWS:  COWS Total Score: 1  Musculoskeletal: Strength & Muscle Tone: within normal limits Gait & Station: normal Patient leans: N/A  Psychiatric Specialty Exam: Physical Exam  ROS  No chest pain, no dyspnea .No yawning, no lacrimation, no nausea, no vomiting, no cramps, no aches, no diarrhea  Blood pressure 120/71, pulse (!) 57, temperature 97.6 F (36.4 C), resp. rate 16, height 5' 11" (1.803 m), weight 120.7 kg, SpO2 97 %.Body mass index is 37.1 kg/m.  General Appearance: Well Groomed  Eye Contact:  Good  Speech:  Normal Rate  Volume:  Normal  Mood:  mood presents improved, and currently euthymic  Affect:  Appropriate  Thought Process:  Linear and Descriptions of Associations: Intact  Orientation:  Full (Time, Place, and Person)  Thought Content:  no hallucinations, no delusions, not internally preoccupied   Suicidal Thoughts:  No denies suicidal or self injurious ideations, denies homicidal or violent ideations  Homicidal Thoughts:  No  Memory:  recent and remote grossly intact   3/3 immediate, and 2/3 at 3 minutes   Judgement:  Fair  Insight:  Fair  Psychomotor Activity:  Normal  Concentration:  Concentration: Good and Attention Span: Good  Recall:  Good  Fund of Knowledge:  Good  Language:  Good  Akathisia:  Negative  Handed:  Right  AIMS (if indicated):     Assets:  Desire for Improvement Resilience  ADL's:  Intact  Cognition:  WNL  Sleep:  Number of Hours: 6.25   Assessment - patient reports feeling better today and at present is fully alert, attentive, oriented x 3. Currently, is able to recall 3/3 objects immediately and 2/3 at 5 minutes . Describes partially improved mood, some lingering anxiety. Denies suicidal ideations. Reports history of good response to Zoloft in the past , without side effects. No current symptoms of opiate WDL.  Treatment Plan  Summary: Daily contact with patient to assess and evaluate symptoms and progress in treatment, Medication management, Plan inpatient admission and medications as below  Encourage group and milieu participation to work on Radiographer, therapeutic and symptom reduction Encourage efforts to work on sobriety and relapse Animal nutritionist working on Primary school teacher Zoloft 50 mgrs QDAY initially for anxiety, depression Continue Opiate Detox protocol to address potential opiate WDL symptoms Continue  Neurontin 100 mgrs TID for anxiety, pain  Jenne Campus, MD 05/08/2018, 5:05 PM

## 2018-05-09 MED ORDER — CARBAMAZEPINE 200 MG PO TABS
200.0000 mg | ORAL_TABLET | Freq: Two times a day (BID) | ORAL | Status: DC
  Administered 2018-05-09 – 2018-05-10 (×2): 200 mg via ORAL
  Filled 2018-05-09 (×6): qty 1

## 2018-05-09 MED ORDER — BUSPIRONE HCL 5 MG PO TABS
5.0000 mg | ORAL_TABLET | Freq: Two times a day (BID) | ORAL | Status: DC
  Administered 2018-05-09 – 2018-05-10 (×2): 5 mg via ORAL
  Filled 2018-05-09 (×6): qty 1

## 2018-05-09 NOTE — Progress Notes (Signed)
D: Patient remains pleasant and denies any withdrawal symptoms.  He is on the clonidine protocol and appears to be doing well.  Patient denies any thoughts of self harm.  He is attending groups and participating.  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  He denies any AVH.  A: Continue to monitor medication management and MD orders.  Safety checks completed every 15 minutes per protocol.  Offer support and encouragement as needed.  R: Patient is receptive to staff; his behavior is appropriate.

## 2018-05-09 NOTE — Progress Notes (Addendum)
[facility] MD Progress Note  05/09/2018 2:26 PM Scott [name]  MRN:  [id number]  Evaluation: Scott [name] observed sitting in day room interacting with peers.  He is awake alert and oriented x3.  Continues to deny suicidal or homicidal ideations.  Reports ongoing anxiety and mood irritability.  States he has been off his medications due to elevated liver enzymes. Report since he has been restarted on is antidepressants he is  feeling a lot better. However patient continues to request mood stabilization medications and something for anxiety.  Patient was initiated on Zoloft reports taken and  tolerating medicaitons well.  Reports he is feeling "tons better" since he has discontinued the  Methadone, which he reports was causing worsening depression.  States he is attending daily group sessions with active and engaged participation.  NP consulted with attending psychiatrist will initiate BuSpar and Tegretol for reported symptoms.  Scott [name] reports a good appetite.  States he is resting well. support encouragement reassurance was provided.   History: Scott [name] an 47 y.o.malewho presents to the ED voluntarily.Pt reports he has been experiencing a manic episode and he left the home where he lives with his wife and children several days ago. Pt states his wife found him "looking like a homeless person" and brought him to the ED. Pt states he has these "episodes" at least 3 times a week in which he does not recall certain events. Pt states he is fearful that he will harm himself during one of his episodes. Pt reports the episodes are usually triggered by severe stress and confusion. Pt states he has attempted suicide 3x in the past by OD on medication. Pt also reports he gets into trouble with law enforcement whenever he is experiencing an episode. Pt states he has multiple felonies and he also has an upcoming court date on 07/12/18 due to felony larceny. Pt states he tries to do yard work in the middle of the night  during his episodes. Pt states he was followed by Dr. Betti Cruz, MD but he currently does not have any OPT MH provider and he is not taking any psych meds.Pt states he does not go to the [place] due to having bad experiences  Principal Problem: MDD (major depressive disorder), recurrent severe, without psychosis (HCC) Diagnosis:   Patient Active Problem List   Diagnosis Date Noted  . MDD (major depressive disorder), recurrent episode, severe (HCC) [F33.2] 05/06/2018  . MDD (major depressive disorder) [F32.9] 05/06/2018  . Elevated liver enzymes [R74.8] 03/13/2018  . MDD (major depressive disorder), recurrent severe, without psychosis (HCC) [F33.2] 03/06/2018  . MDD (major depressive disorder), severe (HCC) [F32.2] 12/16/2017  . Schizoaffective disorder (HCC) [F25.9] 06/07/2017  . Alcohol dependence with alcohol-induced mood disorder (HCC) [F10.24] 07/24/2015  . Alcohol withdrawal (HCC) [F10.239] 07/24/2015  . Cocaine use disorder, mild, abuse (HCC) [F14.10] 07/24/2015  . Substance induced mood disorder (HCC) [F19.94] 07/22/2015  . Drug-seeking behavior [Z76.5] 02/16/2015  . PTSD (post-traumatic stress disorder) [F43.10] 12/26/2014  . Opioid type dependence, continuous (HCC) [F11.20] 12/26/2014  . Major depressive disorder, recurrent episode, moderate (HCC) [F33.1]   . Chest pain at rest [R07.9] 10/22/2013  . Chronic pain syndrome [G89.4] 10/22/2013  . Seizure disorder (HCC) [G40.909] 10/22/2013  . Chest pain [R07.9] 10/21/2013   Total Time spent with patient: 20 minutes  Past Psychiatric History:   Past Medical History:  Past Medical History:  Diagnosis Date  . Accidental heroin overdose (HCC)   . Anxiety   . Back pain   .  Bipolar 1 disorder (HCC)   . Chronic back pain   . Depression   . Drug-seeking behavior   . GERD (gastroesophageal reflux disease)   . Heroin abuse (HCC)   . History of ETOH abuse   . Myocardial infarction [facility])    Pt says that the doctor determined it was panic  attacks  . Opioid dependence (HCC)   . PTSD (post-traumatic stress disorder)   . Seizures (HCC)    alcohol induced- pt reports sober for 6 months    Past Surgical History:  Procedure Laterality Date  . ABDOMINAL SURGERY    . APPENDECTOMY    . BACK SURGERY     Family History:  Family History  Problem Relation Age of Onset  . Hypertension Mother   . CAD Father   . COPD Father   . Stroke Father   . Testicular cancer Brother    Family Psychiatric  History:  Social History:  Social History   Substance and Sexual Activity  Alcohol Use Not Currently   Comment: Pt reports sober for 6 months     Social History   Substance and Sexual Activity  Drug Use Not Currently  . Types: Methamphetamines, Marijuana, IV, Cocaine   Comment: Pt says not sure why positive for amphetamines    Social History   Socioeconomic History  . Marital status: Married    Spouse name: Not on file  . Number of children: Not on file  . Years of education: Not on file  . Highest education level: Not on file  Occupational History  . Not on file  Social Needs  . Financial resource strain: Not on file  . Food insecurity:    Worry: Not on file    Inability: Not on file  . Transportation needs:    Medical: Not on file    Non-medical: Not on file  Tobacco Use  . Smoking status: Current Every Day Smoker    Packs/day: 1.50    Types: Cigarettes  . Smokeless tobacco: Never Used  Substance and Sexual Activity  . Alcohol use: Not Currently    Comment: Pt reports sober for 6 months  . Drug use: Not Currently    Types: Methamphetamines, Marijuana, IV, Cocaine    Comment: Pt says not sure why positive for amphetamines  . Sexual activity: Yes    Birth control/protection: None    Comment: heroin  Lifestyle  . Physical activity:    Days per week: Not on file    Minutes per session: Not on file  . Stress: Not on file  Relationships  . Social connections:    Talks on phone: Not on file    Gets  together: Not on file    Attends religious service: Not on file    Active member of club or organization: Not on file    Attends meetings of clubs or organizations: Not on file    Relationship status: Not on file  Other Topics Concern  . Not on file  Social History Narrative  . Not on file   Additional Social History:   Sleep: Good- reports he did not have any nightmares last night  Appetite:  Good  Current Medications: Current Facility-Administered Medications  Medication Dose Route Frequency Provider Last Rate Last Dose  . acetaminophen (TYLENOL) tablet 650 mg  650 mg Oral Q6H PRN Oneta Rack, NP      . busPIRone (BUSPAR) tablet 5 mg  5 mg Oral BID Oneta Rack, NP      .  carbamazepine (TEGRETOL) tablet 200 mg  200 mg Oral BID Oneta Rack, NP      . cloNIDine (CATAPRES) tablet 0.1 mg  0.1 mg Oral Esperanza Heir, NP   0.1 mg at 05/09/18 0813   Followed by  . [START ON 05/11/2018] cloNIDine (CATAPRES) tablet 0.1 mg  0.1 mg Oral QAC breakfast Oneta Rack, NP      . dicyclomine (BENTYL) tablet 20 mg  20 mg Oral Q6H PRN Oneta Rack, NP      . gabapentin (NEURONTIN) capsule 100 mg  100 mg Oral TID Cobos, Rockey Situ, MD   100 mg at 05/09/18 1257  . loperamide (IMODIUM) capsule 2-4 mg  2-4 mg Oral PRN Oneta Rack, NP      . magnesium hydroxide (MILK OF MAGNESIA) suspension 30 mL  30 mL Oral Daily PRN Oneta Rack, NP      . menthol-cetylpyridinium (CEPACOL) lozenge 3 mg  1 lozenge Oral PRN Donell Sievert E, PA-C      . methocarbamol (ROBAXIN) tablet 500 mg  500 mg Oral Q8H PRN Oneta Rack, NP   500 mg at 05/08/18 1730  . nicotine (NICODERM CQ - dosed in mg/24 hours) patch 21 mg  21 mg Transdermal Daily Cobos, Rockey Situ, MD   21 mg at 05/09/18 0813  . ondansetron (ZOFRAN-ODT) disintegrating tablet 4 mg  4 mg Oral Q6H PRN Oneta Rack, NP      . sertraline (ZOLOFT) tablet 50 mg  50 mg Oral Daily Cobos, Rockey Situ, MD   50 mg at 05/09/18 0809     Lab Results: No results found for this or any previous visit (from the past 48 hour(s)).  Blood Alcohol level:  Lab Results  Component Value Date   ETH <10 05/06/2018   ETH <10 03/05/2018    Metabolic Disorder Labs: No results found for: HGBA1C, MPG No results found for: PROLACTIN No results found for: CHOL, TRIG, HDL, CHOLHDL, VLDL, LDLCALC  Physical Findings: AIMS: Facial and Oral Movements Muscles of Facial Expression: None, normal Lips and Perioral Area: None, normal Jaw: None, normal Tongue: None, normal,Extremity Movements Upper (arms, wrists, hands, fingers): None, normal Lower (legs, knees, ankles, toes): None, normal, Trunk Movements Neck, shoulders, hips: None, normal, Overall Severity Severity of abnormal movements (highest score from questions above): None, normal Incapacitation due to abnormal movements: None, normal Patient's awareness of abnormal movements (rate only patient's report): No Awareness, Dental Status Current problems with teeth and/or dentures?: No Does patient usually wear dentures?: No  CIWA:  CIWA-Ar Total: 1 COWS:  COWS Total Score: 0  Musculoskeletal: Strength & Muscle Tone: within normal limits Gait & Station: normal Patient leans: N/A  Psychiatric Specialty Exam: Physical Exam  Vitals reviewed. Constitutional: He appears well-developed.  Neurological: He is alert.  Psychiatric: He has a normal mood and affect. His behavior is normal.    Review of Systems  Psychiatric/Behavioral: Positive for depression. Negative for suicidal ideas. The patient is nervous/anxious.   All other systems reviewed and are negative.   No chest pain, no dyspnea .No yawning, no lacrimation, no nausea, no vomiting, no cramps, no aches, no diarrhea  Blood pressure 114/72, pulse (!) 55, temperature 97.8 F (36.6 C), temperature source Oral, resp. rate (!) 24, height 5\' 11"  (1.803 m), weight 120.7 kg, SpO2 97 %.Body mass index is 37.1 kg/m.  General  Appearance: Well Groomed  Eye Contact:  Good  Speech:  Normal Rate  Volume:  Normal  Mood:  Anxious  Affect:  Appropriate  Thought Process:  Linear and Descriptions of Associations: Intact  Orientation:  Full (Time, Place, and Person)  Thought Content:  no hallucinations, no delusions, not internally preoccupied   Suicidal Thoughts:  No   Homicidal Thoughts:  No  Memory:  recent and remote grossly intact      Judgement:  Fair  Insight:  Fair  Psychomotor Activity:  Normal  Concentration:  Concentration: Good and Attention Span: Good  Recall:  Good  Fund of Knowledge:  Good  Language:  Good  Akathisia:  Negative  Handed:  Right  AIMS (if indicated):     Assets:  Desire for Improvement Resilience  ADL's:  Intact  Cognition:  WNL  Sleep:  Number of Hours: 6.75    Treatment Plan Summary: Daily contact with patient to assess and evaluate symptoms and progress in treatment and Medication management   Continue with current treatment plan on 05/09/2018 as listed below except where noted  Continue  Zoloft 50 mgrs QDAY  for anxiety, depression Continue Opiate Detox protocol to address potential opiate WDL symptoms Continue  Neurontin 100 mgrs TID for anxiety, pain Initiated Buspar 5 mg PO BID for anxiety  Initiated  Tegretol 200 mg PO BID for mood stabilization   Encourage group and milieu participation to work on Pharmacologist and symptom reduction Encourage efforts to work on sobriety and relapse prevention Treatment Team working on disposition planning   Oneta Rack, NP 05/09/2018, 2:26 PM    ..Agree with NP Progress Note

## 2018-05-09 NOTE — Progress Notes (Signed)
Adult Psychoeducational Group Note  Date:  05/09/2018 Time:  1:55 AM  Group Topic/Focus:  Wrap-Up Group:   The focus of this group is to help patients review their daily goal of treatment and discuss progress on daily workbooks.  Participation Level:  Active  Participation Quality:  Appropriate  Affect:  Appropriate  Cognitive:  Appropriate  Insight: Appropriate  Engagement in Group:  Engaged  Modes of Intervention:  Discussion  Additional Comments:  Pt's goal was to feel normal but that has not happened.  Pt spoke with doctor about his mania and needing meds.  Pt stated that he has been eating good.  Pt rated the day at a 8/10.  Scott [name] 05/09/2018, 1:55 AM

## 2018-05-09 NOTE — Progress Notes (Signed)
Pt reports he had a fair day.  He denies SI/HI/AVH at this time.  He continues to have difficulty with his chronic pain and has received his scheduled meds per orders.  He is on the Clonidine protocol to withdraw from Methadone.  He did ask about med changes that he says the doctor was supposed to make, but when writer informed him that all the changes he asked about were not made, he was appropriate and said he would inquire about them tomorrow.  Pt has been pleasant and appropriate with staff.  Support and encouragement offered.  Discharge plans are in process.  Safety maintained with q15 minute checks.

## 2018-05-09 NOTE — [facility] Group Notes (Signed)
Adult Psychoeducational Group Note  Date:  05/09/2018 Time:  4:00 PM  Group Topic/Focus: [facility] Jeopardy  Patients divide into two teams to answer jeopardy style questions about Crisis Management, Medications, Recovery, Wellness and Geographical information systems officer. Each answer is dicussed among the group.  Participation Level:  Active  Participation Quality:  Appropriate and Attentive  Affect:  Appropriate  Cognitive:  Alert and Oriented  Insight: Improving  Engagement in Group:  Developing/Improving  Modes of Intervention:  Activity, Discussion, Education and Socialization  Additional Comments:  Patient participated in the activity and was respectful towards peers. Patient reported one thing they learned from this activity is "how much I actually knew about these things myself".  Marchelle Folks A Keshun Berrett 05/09/2018, 5:00 PM

## 2018-05-09 NOTE — [facility] Group Notes (Signed)
[facility] LCSW Group Therapy Note  Date/Time: 05/09/18, 1315  Type of Therapy/Topic:  Group Therapy:  Feelings about Diagnosis  Participation Level:  Active   Mood:pleasant   Description of Group:    This group will allow patients to explore their thoughts and feelings about diagnoses they have received. Patients will be guided to explore their level of understanding and acceptance of these diagnoses. Facilitator will encourage patients to process their thoughts and feelings about the reactions of others to their diagnosis, and will guide patients in identifying ways to discuss their diagnosis with significant others in their lives. This group will be process-oriented, with patients participating in exploration of their own experiences as well as giving and receiving support and challenge from other group members.   Therapeutic Goals: 1. Patient will demonstrate understanding of diagnosis as evidence by identifying two or more symptoms of the disorder:  2. Patient will be able to express two feelings regarding the diagnosis 3. Patient will demonstrate ability to communicate their needs through discussion and/or role plays  Summary of Patient Progress:Pt continues to have trouble restraining himself from taking over the group and making all the comments.  Pt did respond when CSW asked him to allow others to speak as well.  Attentive and engaged.        Therapeutic Modalities:   Cognitive Behavioral Therapy Brief Therapy Feelings Identification   Daleen Squibb, LCSW

## 2018-05-09 NOTE — Progress Notes (Signed)
Adult Psychoeducational Group Note  Date:  05/09/2018 Time:  8:56 PM  Group Topic/Focus:  Wrap-Up Group:   The focus of this group is to help patients review their daily goal of treatment and discuss progress on daily workbooks.  Participation Level:  Active  Participation Quality:  Appropriate  Affect:  Appropriate  Cognitive:  Appropriate  Insight: Appropriate  Engagement in Group:  Engaged  Modes of Intervention:  Discussion  Additional Comments: The patient expressed that he rates today a 9.The patient also said that he attended groups and reach his goals.  Octavio Manns 05/09/2018, 8:56 PM

## 2018-05-09 NOTE — [facility] Suicide Risk Assessment (Signed)
[facility] INPATIENT:  Family/Significant Other Suicide Prevention Education  Suicide Prevention Education:  Education Completed; Jacquees Gongora, wife, [phone], has been identified by the patient as the family member/significant other with whom the patient will be residing, and identified as the person(s) who will aid the patient in the event of a mental health crisis (suicidal ideations/suicide attempt).  With written consent from the patient, the family member/significant other has been provided the following suicide prevention education, prior to the and/or following the discharge of the patient.  The suicide prevention education provided includes the following:  Suicide risk factors  Suicide prevention and interventions  National Suicide Hotline telephone number  [facility] assessment telephone number  [facility] Emergency Assistance 911  [facility] and/or Residential Mobile Crisis Unit telephone number  Request made of family/significant other to:  Remove weapons (e.g., guns, rifles, knives), all items previously/currently identified as safety concern.    Remove drugs/medications (over-the-counter, prescriptions, illicit drugs), all items previously/currently identified as a safety concern.  The family member/significant other verbalizes understanding of the suicide prevention education information provided.  The family member/significant other agrees to remove the items of safety concern listed above.    Lorri Frederick, LCSW 05/09/2018, 4:16 PM

## 2018-05-10 MED ORDER — CARBAMAZEPINE 200 MG PO TABS: 200.0000 mg | ORAL_TABLET | Freq: Two times a day (BID) | ORAL | 1 refills | Status: DC

## 2018-05-10 MED ORDER — SERTRALINE HCL 50 MG PO TABS: 50.0000 mg | ORAL_TABLET | Freq: Every day | ORAL | 1 refills | Status: DC

## 2018-05-10 MED ORDER — GABAPENTIN 100 MG PO CAPS: 100.0000 mg | ORAL_CAPSULE | Freq: Three times a day (TID) | ORAL | 1 refills | Status: DC

## 2018-05-10 MED ORDER — BUSPIRONE HCL 5 MG PO TABS: 5.0000 mg | ORAL_TABLET | Freq: Two times a day (BID) | ORAL | 1 refills | Status: DC

## 2018-05-10 NOTE — Progress Notes (Signed)
Nursing discharge note: Patient discharged home per MD order.  Patient received all personal belongings from unit and locker.  Reviewed AVS/transition record with patient and he indicates understanding.  Patient will follow up with his outpatient provider.  Patient denies any thoughts of self harm.  He left ambulatory with his wife.

## 2018-05-10 NOTE — [facility] Suicide Risk Assessment (Addendum)
[facility] Discharge Suicide Risk Assessment   Principal Problem: MDD (major depressive disorder), recurrent severe, without psychosis (HCC) Discharge Diagnoses:  Patient Active Problem List   Diagnosis Date Noted  . MDD (major depressive disorder), recurrent episode, severe (HCC) [F33.2] 05/06/2018  . MDD (major depressive disorder) [F32.9] 05/06/2018  . Elevated liver enzymes [R74.8] 03/13/2018  . MDD (major depressive disorder), recurrent severe, without psychosis (HCC) [F33.2] 03/06/2018  . MDD (major depressive disorder), severe (HCC) [F32.2] 12/16/2017  . Schizoaffective disorder (HCC) [F25.9] 06/07/2017  . Alcohol dependence with alcohol-induced mood disorder (HCC) [F10.24] 07/24/2015  . Alcohol withdrawal (HCC) [F10.239] 07/24/2015  . Cocaine use disorder, mild, abuse (HCC) [F14.10] 07/24/2015  . Substance induced mood disorder (HCC) [F19.94] 07/22/2015  . Drug-seeking behavior [Z76.5] 02/16/2015  . PTSD (post-traumatic stress disorder) [F43.10] 12/26/2014  . Opioid type dependence, continuous (HCC) [F11.20] 12/26/2014  . Major depressive disorder, recurrent episode, moderate (HCC) [F33.1]   . Chest pain at rest [R07.9] 10/22/2013  . Chronic pain syndrome [G89.4] 10/22/2013  . Seizure disorder (HCC) [G40.909] 10/22/2013  . Chest pain [R07.9] 10/21/2013    Total Time spent with patient: 30 minutes  Musculoskeletal: Strength & Muscle Tone: within normal limits Gait & Station: normal Patient leans: N/A  Psychiatric Specialty Exam: ROS mild headache, no chest pain, no shortness of breath, no vomiting Denies any symptoms of opiate withdrawal- no yawning, no rhinorrhea or lacrimation, no aches or myalgias, no cramping, no nausea, no diarrhea  Blood pressure 121/80, pulse 64, temperature 98.4 F (36.9 C), resp. rate 20, height 5\' 11"  (1.803 m), weight 120.7 kg, SpO2 97 %.Body mass index is 37.1 kg/m.  General Appearance: Well Groomed  Eye Contact::  Good  Speech:  Normal Rate409   Volume:  Normal  Mood:  reports mood much improved, denies depression, presents euthymic  Affect:  improved, reactive, currently presents euthymic, bright  Thought Process:  Linear and Descriptions of Associations: Intact  Orientation:  Full (Time, Place, and Person)- presents fully alert and attentive and oriented x 3   Thought Content:  no hallucinations, no delusions, not internally preoccupied   Suicidal Thoughts:  No denies suicidal or self injurious ideations, denies homicidal or violent ideations  Homicidal Thoughts:  No  Memory:  recent and remote grossly intact   Judgement:  Other:  improving   Insight:  improving   Psychomotor Activity:  Normal  Concentration:  Good  Recall:  Good  Fund of Knowledge:Good  Language: Good  Akathisia:  Negative  Handed:  Right  AIMS (if indicated):     Assets:  Communication Skills Desire for Improvement Resilience  Sleep:  Number of Hours: 6.75  Cognition: WNL  ADL's:  Intact   Mental Status Per Nursing Assessment::   On Admission:  NA  Demographic Factors:  70, married, has three children  Loss Factors: Financial/marital stressors  Historical Factors: History of depression, history of suicide attempts, history of substance abuse ( reports cocaine was substance of choice), had been on methadone maintenance for opiate use disorder  Risk Reduction Factors:   Sense of responsibility to family, Living with another person, especially a relative and Positive coping skills or problem solving skills  Continued Clinical Symptoms:  At this time patient is alert, attentive, well related, pleasant, calm, mood improved and currently euthymic, with appropriate/bright affect , no thought disorder, no suicidal or self injurious ideations, no homicidal or violent ideations, no hallucinations,no delusions, no current confusion or mental status changes, presents oriented x 3. Visible on unit ,  interacting appropriately with peers, noted to have  appropriate, bright affect, pleasant on approach. With patient's express consent I spoke with his wife, who corroborates he presents improved/stable and who is in agreement with discharge. Patient has had no dissociative or amnestic symptoms on unit  , and presents alert, attentive and oriented x 3.  Of note denies/does not present with any opiate WDL symptoms. States he feels better off methadone maintenance .Vitals stable, presents comfortable and in no acute distress. Denies medication side effects- side effects reviewed . Has been on Tegretol in the past without side effects and good response . We reviewed importance of periodic lab testing and regular outpatient psychiatric management with this medication.   Cognitive Features That Contribute To Risk:  No gross cognitive deficits noted upon discharge. Is alert , attentive, and oriented x 3   Suicide Risk:  Mild:  Suicidal ideation of limited frequency, intensity, duration, and specificity.  There are no identifiable plans, no associated intent, mild dysphoria and related symptoms, good self-control (both objective and subjective assessment), few other risk factors, and identifiable protective factors, including available and accessible social support.    Plan Of Care/Follow-up recommendations:  Activity:  as tolerated  Diet:  regular Tests:  NA Other:  See below  Patient is expressing readiness for discharge and there are no current grounds for involuntary commitment at this time. Plans to return home. Plans to continue outpatient psychiatric medication management.  He has an established PCP at [facility].  Due to history of amnestic episodes  , I have recommended he also see an outpatient Neurologist to establish Neurological follow up. Of note, patient states he does not drive /does not have driver's license . Encouraged patient to avoid people, places and situations associated with drug or alcohol use in order to decrease triggers  and to consider regular 12 step program attendance .  Craige Cotta, MD 05/10/2018, 11:10 AM

## 2018-05-10 NOTE — [facility] Group Notes (Signed)
[facility] Mental Health Association Group Therapy 05/10/2018 1:15pm  Type of Therapy: Mental Health Association Presentation  Participation Level: Active  Participation Quality: Attentive  Affect: Appropriate  Cognitive: Oriented  Insight: Developing/Improving  Engagement in Therapy: Engaged  Modes of Intervention: Discussion, Education and Socialization  Summary of Progress/Problems: Mental Health Association (MHA) Speaker came to talk about his personal journey with mental health. The pt processed ways by which to relate to the speaker. MHA speaker provided handouts and educational information pertaining to groups and services offered by the [facility] Pt was engaged in speaker's presentation and was receptive to resources provided.    Lorri Frederick, LCSW 05/10/2018 3:13 PM

## 2018-05-10 NOTE — Progress Notes (Signed)
Recreation Therapy Notes  Date: 10.2.19 Time: 0930 Location: [address] Dayroom  Group Topic: Stress Management  Goal Area(s) Addresses:  Patient will verbalize importance of using healthy stress management.  Patient will identify positive emotions associated with healthy stress management.   Intervention: Stress Management  Activity :  Guided Imagery.  LRT introduced the stress management technique of guided imagery.  LRT read a script taking the patients on a journey on the beach at sunrise.  Patients were to listen and follow along as script was read.  Education:  Stress Management, Discharge Planning.   Education Outcome: Acknowledges edcuation/In group clarification offered/Needs additional education  Clinical Observations/Feedback: Pt did not attend group.    Caroll Rancher, LRT/CTRS         Caroll Rancher A 05/10/2018 11:32 AM

## 2018-05-10 NOTE — Progress Notes (Signed)
Pt reports he had a good day, and that he may be discharged home tomorrow.  He denies SI/HI/AVH. He says that his meds seem to be right for him.  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  He has been observed sitting in the dayroom talking with peers.  He has been pleasant and appropriate with peers and staff.  He makes his needs known to staff.  He voices no concerns to Clinical research associate.  Support and encouragement offered.  Discharge plans are in process.  Safety maintained with q15 minute checks.
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
## 2018-05-19 DIAGNOSIS — R45851 Suicidal ideations: Secondary | ICD-10-CM | POA: Diagnosis not present

## 2018-05-19 DIAGNOSIS — Z888 Allergy status to other drugs, medicaments and biological substances status: Secondary | ICD-10-CM | POA: Diagnosis not present

## 2018-05-19 DIAGNOSIS — I252 Old myocardial infarction: Secondary | ICD-10-CM | POA: Diagnosis not present

## 2018-05-19 DIAGNOSIS — F419 Anxiety disorder, unspecified: Secondary | ICD-10-CM | POA: Diagnosis not present

## 2018-05-19 DIAGNOSIS — Z885 Allergy status to narcotic agent status: Secondary | ICD-10-CM | POA: Diagnosis not present

## 2018-05-19 DIAGNOSIS — I10 Essential (primary) hypertension: Secondary | ICD-10-CM | POA: Diagnosis not present

## 2018-05-19 DIAGNOSIS — B1920 Unspecified viral hepatitis C without hepatic coma: Secondary | ICD-10-CM | POA: Diagnosis not present

## 2018-05-19 DIAGNOSIS — J449 Chronic obstructive pulmonary disease, unspecified: Secondary | ICD-10-CM | POA: Diagnosis not present

## 2018-05-19 DIAGNOSIS — F339 Major depressive disorder, recurrent, unspecified: Secondary | ICD-10-CM | POA: Diagnosis not present

## 2018-05-19 DIAGNOSIS — Z91040 Latex allergy status: Secondary | ICD-10-CM | POA: Diagnosis not present

## 2018-05-19 DIAGNOSIS — F310 Bipolar disorder, current episode hypomanic: Secondary | ICD-10-CM | POA: Diagnosis not present

## 2018-05-19 DIAGNOSIS — Z79899 Other long term (current) drug therapy: Secondary | ICD-10-CM | POA: Diagnosis not present

## 2018-05-21 DIAGNOSIS — B1920 Unspecified viral hepatitis C without hepatic coma: Secondary | ICD-10-CM | POA: Diagnosis not present

## 2018-05-21 DIAGNOSIS — F17200 Nicotine dependence, unspecified, uncomplicated: Secondary | ICD-10-CM | POA: Diagnosis not present

## 2018-05-21 DIAGNOSIS — S39012A Strain of muscle, fascia and tendon of lower back, initial encounter: Secondary | ICD-10-CM | POA: Diagnosis not present

## 2018-05-21 DIAGNOSIS — R4182 Altered mental status, unspecified: Secondary | ICD-10-CM | POA: Diagnosis not present

## 2018-05-21 DIAGNOSIS — R945 Abnormal results of liver function studies: Secondary | ICD-10-CM | POA: Diagnosis not present

## 2018-05-21 DIAGNOSIS — K219 Gastro-esophageal reflux disease without esophagitis: Secondary | ICD-10-CM | POA: Diagnosis not present

## 2018-05-21 DIAGNOSIS — Z62810 Personal history of physical and sexual abuse in childhood: Secondary | ICD-10-CM | POA: Diagnosis not present

## 2018-05-21 DIAGNOSIS — F10231 Alcohol dependence with withdrawal delirium: Secondary | ICD-10-CM | POA: Diagnosis not present

## 2018-05-21 DIAGNOSIS — S0990XA Unspecified injury of head, initial encounter: Secondary | ICD-10-CM | POA: Diagnosis not present

## 2018-05-21 DIAGNOSIS — I119 Hypertensive heart disease without heart failure: Secondary | ICD-10-CM | POA: Diagnosis not present

## 2018-05-21 DIAGNOSIS — I252 Old myocardial infarction: Secondary | ICD-10-CM | POA: Diagnosis not present

## 2018-05-21 DIAGNOSIS — Z886 Allergy status to analgesic agent status: Secondary | ICD-10-CM | POA: Diagnosis not present

## 2018-05-21 DIAGNOSIS — R51 Headache: Secondary | ICD-10-CM | POA: Diagnosis not present

## 2018-05-21 DIAGNOSIS — F17210 Nicotine dependence, cigarettes, uncomplicated: Secondary | ICD-10-CM | POA: Diagnosis not present

## 2018-05-21 DIAGNOSIS — I10 Essential (primary) hypertension: Secondary | ICD-10-CM | POA: Diagnosis not present

## 2018-05-21 DIAGNOSIS — S161XXA Strain of muscle, fascia and tendon at neck level, initial encounter: Secondary | ICD-10-CM | POA: Diagnosis not present

## 2018-05-21 DIAGNOSIS — B182 Chronic viral hepatitis C: Secondary | ICD-10-CM | POA: Diagnosis not present

## 2018-05-21 DIAGNOSIS — J449 Chronic obstructive pulmonary disease, unspecified: Secondary | ICD-10-CM | POA: Diagnosis not present

## 2018-05-21 DIAGNOSIS — Z915 Personal history of self-harm: Secondary | ICD-10-CM | POA: Diagnosis not present

## 2018-05-21 DIAGNOSIS — R45851 Suicidal ideations: Secondary | ICD-10-CM | POA: Diagnosis not present

## 2018-05-21 DIAGNOSIS — F250 Schizoaffective disorder, bipolar type: Secondary | ICD-10-CM | POA: Diagnosis not present

## 2018-05-25 DIAGNOSIS — R945 Abnormal results of liver function studies: Secondary | ICD-10-CM | POA: Diagnosis not present

## 2018-05-25 DIAGNOSIS — S39012A Strain of muscle, fascia and tendon of lower back, initial encounter: Secondary | ICD-10-CM | POA: Diagnosis not present

## 2018-05-25 DIAGNOSIS — S0990XA Unspecified injury of head, initial encounter: Secondary | ICD-10-CM | POA: Diagnosis not present

## 2018-05-25 DIAGNOSIS — R51 Headache: Secondary | ICD-10-CM | POA: Diagnosis not present

## 2018-05-25 DIAGNOSIS — B1920 Unspecified viral hepatitis C without hepatic coma: Secondary | ICD-10-CM | POA: Diagnosis not present

## 2018-05-25 DIAGNOSIS — I252 Old myocardial infarction: Secondary | ICD-10-CM | POA: Diagnosis not present

## 2018-05-25 DIAGNOSIS — Z886 Allergy status to analgesic agent status: Secondary | ICD-10-CM | POA: Diagnosis not present

## 2018-05-25 DIAGNOSIS — F17200 Nicotine dependence, unspecified, uncomplicated: Secondary | ICD-10-CM | POA: Diagnosis not present

## 2018-05-25 DIAGNOSIS — S161XXA Strain of muscle, fascia and tendon at neck level, initial encounter: Secondary | ICD-10-CM | POA: Diagnosis not present

## 2018-05-25 DIAGNOSIS — I10 Essential (primary) hypertension: Secondary | ICD-10-CM | POA: Diagnosis not present

## 2018-05-28 DIAGNOSIS — Z888 Allergy status to other drugs, medicaments and biological substances status: Secondary | ICD-10-CM | POA: Diagnosis not present

## 2018-05-28 DIAGNOSIS — Z79891 Long term (current) use of opiate analgesic: Secondary | ICD-10-CM | POA: Diagnosis not present

## 2018-05-28 DIAGNOSIS — G8929 Other chronic pain: Secondary | ICD-10-CM | POA: Diagnosis not present

## 2018-05-28 DIAGNOSIS — F1410 Cocaine abuse, uncomplicated: Secondary | ICD-10-CM | POA: Diagnosis not present

## 2018-05-28 DIAGNOSIS — F419 Anxiety disorder, unspecified: Secondary | ICD-10-CM | POA: Diagnosis not present

## 2018-05-28 DIAGNOSIS — G4700 Insomnia, unspecified: Secondary | ICD-10-CM | POA: Diagnosis not present

## 2018-05-28 DIAGNOSIS — G894 Chronic pain syndrome: Secondary | ICD-10-CM | POA: Diagnosis not present

## 2018-05-28 DIAGNOSIS — F259 Schizoaffective disorder, unspecified: Secondary | ICD-10-CM | POA: Diagnosis not present

## 2018-05-28 DIAGNOSIS — I10 Essential (primary) hypertension: Secondary | ICD-10-CM | POA: Diagnosis not present

## 2018-05-28 DIAGNOSIS — Z008 Encounter for other general examination: Secondary | ICD-10-CM | POA: Diagnosis not present

## 2018-05-28 DIAGNOSIS — G40909 Epilepsy, unspecified, not intractable, without status epilepticus: Secondary | ICD-10-CM | POA: Diagnosis not present

## 2018-05-28 DIAGNOSIS — I252 Old myocardial infarction: Secondary | ICD-10-CM | POA: Diagnosis not present

## 2018-05-28 DIAGNOSIS — F4310 Post-traumatic stress disorder, unspecified: Secondary | ICD-10-CM | POA: Diagnosis not present

## 2018-05-28 DIAGNOSIS — Z79899 Other long term (current) drug therapy: Secondary | ICD-10-CM | POA: Diagnosis not present

## 2018-05-28 DIAGNOSIS — Z91040 Latex allergy status: Secondary | ICD-10-CM | POA: Diagnosis not present

## 2018-05-28 DIAGNOSIS — F17200 Nicotine dependence, unspecified, uncomplicated: Secondary | ICD-10-CM | POA: Diagnosis not present

## 2018-05-28 DIAGNOSIS — Z87892 Personal history of anaphylaxis: Secondary | ICD-10-CM | POA: Diagnosis not present

## 2018-05-28 DIAGNOSIS — F411 Generalized anxiety disorder: Secondary | ICD-10-CM | POA: Diagnosis not present

## 2018-05-29 DIAGNOSIS — F1410 Cocaine abuse, uncomplicated: Secondary | ICD-10-CM | POA: Diagnosis not present

## 2018-05-29 DIAGNOSIS — F411 Generalized anxiety disorder: Secondary | ICD-10-CM | POA: Diagnosis not present

## 2018-05-29 DIAGNOSIS — F17200 Nicotine dependence, unspecified, uncomplicated: Secondary | ICD-10-CM | POA: Diagnosis not present

## 2018-05-29 DIAGNOSIS — G894 Chronic pain syndrome: Secondary | ICD-10-CM | POA: Diagnosis not present

## 2018-05-29 DIAGNOSIS — F4310 Post-traumatic stress disorder, unspecified: Secondary | ICD-10-CM | POA: Diagnosis not present

## 2018-05-30 ENCOUNTER — Other Ambulatory Visit: Payer: Self-pay | Admitting: *Deleted

## 2018-05-30 MED ORDER — SERTRALINE HCL 50 MG PO TABS
50.00 | ORAL_TABLET | ORAL | Status: DC
Start: 2018-05-30 — End: 2018-05-30

## 2018-05-30 MED ORDER — ALBUTEROL SULFATE HFA 108 (90 BASE) MCG/ACT IN AERS
2.00 | INHALATION_SPRAY | RESPIRATORY_TRACT | Status: DC
Start: ? — End: 2018-05-30

## 2018-05-30 MED ORDER — CARBAMAZEPINE 200 MG PO TABS
200.00 | ORAL_TABLET | ORAL | Status: DC
Start: 2018-05-29 — End: 2018-05-30

## 2018-05-30 MED ORDER — GENERIC EXTERNAL MEDICATION
10.00 | Status: DC
Start: ? — End: 2018-05-30

## 2018-05-30 MED ORDER — GENERIC EXTERNAL MEDICATION
1.00 | Status: DC
Start: ? — End: 2018-05-30

## 2018-05-30 MED ORDER — LORAZEPAM 0.5 MG PO TABS
0.50 | ORAL_TABLET | ORAL | Status: DC
Start: ? — End: 2018-05-30

## 2018-05-30 MED ORDER — ACETAMINOPHEN 325 MG PO TABS
650.00 | ORAL_TABLET | ORAL | Status: DC
Start: ? — End: 2018-05-30

## 2018-05-30 MED ORDER — BUSPIRONE HCL 5 MG PO TABS
5.00 | ORAL_TABLET | ORAL | Status: DC
Start: 2018-05-29 — End: 2018-05-30

## 2018-05-30 MED ORDER — ZOLPIDEM TARTRATE 5 MG PO TABS
5.00 | ORAL_TABLET | ORAL | Status: DC
Start: ? — End: 2018-05-30

## 2018-05-30 MED ORDER — POLYETHYLENE GLYCOL 3350 17 G PO PACK
17.00 | PACK | ORAL | Status: DC
Start: ? — End: 2018-05-30

## 2018-05-30 MED ORDER — GABAPENTIN 100 MG PO CAPS
100.00 | ORAL_CAPSULE | ORAL | Status: DC
Start: 2018-05-29 — End: 2018-05-30

## 2018-05-30 MED ORDER — ALUM & MAG HYDROXIDE-SIMETH 200-200-20 MG/5ML PO SUSP
30.00 | ORAL | Status: DC
Start: ? — End: 2018-05-30

## 2018-05-30 MED ORDER — GENERIC EXTERNAL MEDICATION
25.00 | Status: DC
Start: ? — End: 2018-05-30

## 2018-05-30 MED ORDER — NICOTINE 21 MG/24HR TD PT24
1.00 | MEDICATED_PATCH | TRANSDERMAL | Status: DC
Start: 2018-05-30 — End: 2018-05-30

## 2018-05-30 MED ORDER — BUDESONIDE-FORMOTEROL FUMARATE 80-4.5 MCG/ACT IN AERO
2.00 | INHALATION_SPRAY | RESPIRATORY_TRACT | Status: DC
Start: 2018-05-29 — End: 2018-05-30

## 2018-05-30 NOTE — Patient Outreach (Signed)
[facility]) Care Management  05/30/2018  Scott [name] 1970/08/22 [id number]   Transition of Care Referral   Referral Date:  05/30/18 Referral Source: HTA IP discharges Date of Admission: 05/25/18 Diagnosis: closed head injury  Date of Discharge:  on 05/27/18 Facility: [facility] [place] Insurance: HTA   Outreach attempt # 1 Male at the home number informed CM that [name] is not available and he has been re admitted back to a 7 day BH program in [place]  Plan: [facility] RN CM will close case at this time as patient is hospitalized Consumer enrolled in an external program    [name] L. Noelle Penner, RN, BSN, CCM [facility] Telephonic Care Management Care Coordinator Direct Number [phone] Mobile number [phone]  Main [facility] number [phone] Fax number [phone]

## 2018-06-06 DIAGNOSIS — R748 Abnormal levels of other serum enzymes: Secondary | ICD-10-CM | POA: Diagnosis not present

## 2018-06-06 DIAGNOSIS — F4481 Dissociative identity disorder: Secondary | ICD-10-CM | POA: Diagnosis not present

## 2018-06-06 DIAGNOSIS — Z915 Personal history of self-harm: Secondary | ICD-10-CM | POA: Diagnosis not present

## 2018-06-06 DIAGNOSIS — F17210 Nicotine dependence, cigarettes, uncomplicated: Secondary | ICD-10-CM | POA: Diagnosis not present

## 2018-06-06 DIAGNOSIS — G8929 Other chronic pain: Secondary | ICD-10-CM | POA: Diagnosis not present

## 2018-06-06 DIAGNOSIS — F1010 Alcohol abuse, uncomplicated: Secondary | ICD-10-CM | POA: Diagnosis not present

## 2018-06-06 DIAGNOSIS — F250 Schizoaffective disorder, bipolar type: Secondary | ICD-10-CM | POA: Diagnosis not present

## 2018-06-06 DIAGNOSIS — Z818 Family history of other mental and behavioral disorders: Secondary | ICD-10-CM | POA: Diagnosis not present

## 2018-06-06 DIAGNOSIS — G479 Sleep disorder, unspecified: Secondary | ICD-10-CM | POA: Diagnosis not present

## 2018-06-06 DIAGNOSIS — R4689 Other symptoms and signs involving appearance and behavior: Secondary | ICD-10-CM | POA: Diagnosis not present

## 2018-06-06 DIAGNOSIS — F1910 Other psychoactive substance abuse, uncomplicated: Secondary | ICD-10-CM | POA: Diagnosis not present

## 2018-06-06 DIAGNOSIS — F4312 Post-traumatic stress disorder, chronic: Secondary | ICD-10-CM | POA: Diagnosis not present

## 2018-06-06 DIAGNOSIS — Z79899 Other long term (current) drug therapy: Secondary | ICD-10-CM | POA: Diagnosis not present

## 2018-06-06 DIAGNOSIS — F1210 Cannabis abuse, uncomplicated: Secondary | ICD-10-CM | POA: Diagnosis not present

## 2018-06-06 DIAGNOSIS — F1290 Cannabis use, unspecified, uncomplicated: Secondary | ICD-10-CM | POA: Diagnosis not present

## 2018-06-06 DIAGNOSIS — F4310 Post-traumatic stress disorder, unspecified: Secondary | ICD-10-CM | POA: Diagnosis not present

## 2018-06-06 DIAGNOSIS — F10929 Alcohol use, unspecified with intoxication, unspecified: Secondary | ICD-10-CM | POA: Diagnosis not present

## 2018-06-06 DIAGNOSIS — Z62810 Personal history of physical and sexual abuse in childhood: Secondary | ICD-10-CM | POA: Diagnosis not present

## 2018-06-06 DIAGNOSIS — F1020 Alcohol dependence, uncomplicated: Secondary | ICD-10-CM | POA: Diagnosis not present

## 2018-06-06 DIAGNOSIS — F10229 Alcohol dependence with intoxication, unspecified: Secondary | ICD-10-CM | POA: Diagnosis not present

## 2018-06-06 DIAGNOSIS — J449 Chronic obstructive pulmonary disease, unspecified: Secondary | ICD-10-CM | POA: Diagnosis not present

## 2018-06-06 DIAGNOSIS — M549 Dorsalgia, unspecified: Secondary | ICD-10-CM | POA: Diagnosis not present

## 2018-06-06 DIAGNOSIS — I10 Essential (primary) hypertension: Secondary | ICD-10-CM | POA: Diagnosis not present

## 2018-06-13 ENCOUNTER — Other Ambulatory Visit: Payer: Self-pay

## 2018-06-13 NOTE — Patient Outreach (Signed)
[facility]) Care Management  06/13/2018  RAYDER SULLENGER Nov 16, 1970 [id number]    Transition of Care Referral  Referral Date: 06/13/18 Referral Source: HTA Discharge Report Date of Admission: unknown Diagnosis: unknown Date of Discharge: 06/10/18 Facility: [facility] Insurance: HTA   Incoming call from patient returning RN CM call. Patient voices that he is doing fairly well since return home. Patient shares that he has extensive psych/mental health issues and had "another episode." He reports that his mood and feelings are better. His Zoloft was increased and he attributes change in medication as the reason why he is feeling better. Patient confirmed that he has all his meds in the home and understands how and when to take them. He was unable to complete med review as his meds were not nearby. Patient confirmed that he has psych follow up appt on tomorrow and his spouse will be accompanying him to appt. He voices that he is followed by [facility] for PCP. [facility] services reviewed and discussed with patient. Patient does not feel like he needs any of the services at this time. He was appreciative to know services available in the future if needed and advised that RN CM had mailed out [facility] brochure and contact info for future reference.       Plan: RN CM will close case at this time.  Antionette Fairy, RN,BSN,CCM [facility] Care Management Telephonic Care Management Coordinator Direct Phone: [phone] Toll Free: [phone] Fax: [phone]

## 2018-06-13 NOTE — Patient Outreach (Signed)
[facility]) Care Management  06/13/2018  Scott [name] August 31, 1970 [id number]     Transition of Care Referral  Referral Date: 06/13/18 Referral Source: HTA Discharge Report Date of Admission: unknown Diagnosis: unknown Date of Discharge: 06/10/18 Facility: [facility] Insurance: HTA    Outreach attempt # 1 to patient. No answer at present. RN CM left HIPAA compliant voicemail message along with contact info.    Plan: RN CM will make outreach attempt to patient within 3-4 business days. RN CM will send unsuccessful outreach letter to patient.   Antionette Fairy, RN,BSN,CCM [facility] Care Management Telephonic Care Management Coordinator Direct Phone: [phone] Toll Free: [phone] Fax: [phone]

## 2018-06-14 DIAGNOSIS — R0602 Shortness of breath: Secondary | ICD-10-CM | POA: Diagnosis not present

## 2018-06-14 DIAGNOSIS — Z6836 Body mass index (BMI) 36.0-36.9, adult: Secondary | ICD-10-CM | POA: Diagnosis not present

## 2018-06-14 DIAGNOSIS — B1920 Unspecified viral hepatitis C without hepatic coma: Secondary | ICD-10-CM | POA: Diagnosis not present

## 2018-06-14 DIAGNOSIS — Z886 Allergy status to analgesic agent status: Secondary | ICD-10-CM | POA: Diagnosis not present

## 2018-06-14 DIAGNOSIS — Z87891 Personal history of nicotine dependence: Secondary | ICD-10-CM | POA: Diagnosis not present

## 2018-06-14 DIAGNOSIS — F419 Anxiety disorder, unspecified: Secondary | ICD-10-CM | POA: Diagnosis not present

## 2018-06-14 DIAGNOSIS — Z91040 Latex allergy status: Secondary | ICD-10-CM | POA: Diagnosis not present

## 2018-06-14 DIAGNOSIS — I252 Old myocardial infarction: Secondary | ICD-10-CM | POA: Diagnosis not present

## 2018-06-14 DIAGNOSIS — E669 Obesity, unspecified: Secondary | ICD-10-CM | POA: Diagnosis not present

## 2018-06-14 DIAGNOSIS — J449 Chronic obstructive pulmonary disease, unspecified: Secondary | ICD-10-CM | POA: Diagnosis not present

## 2018-06-14 DIAGNOSIS — J9811 Atelectasis: Secondary | ICD-10-CM | POA: Diagnosis not present

## 2018-06-14 DIAGNOSIS — Z888 Allergy status to other drugs, medicaments and biological substances status: Secondary | ICD-10-CM | POA: Diagnosis not present

## 2018-06-14 DIAGNOSIS — R0789 Other chest pain: Secondary | ICD-10-CM | POA: Diagnosis not present

## 2018-06-14 DIAGNOSIS — Z8679 Personal history of other diseases of the circulatory system: Secondary | ICD-10-CM | POA: Diagnosis not present

## 2018-06-14 DIAGNOSIS — F4310 Post-traumatic stress disorder, unspecified: Secondary | ICD-10-CM | POA: Diagnosis not present

## 2018-06-14 DIAGNOSIS — R069 Unspecified abnormalities of breathing: Secondary | ICD-10-CM | POA: Diagnosis not present

## 2018-06-14 DIAGNOSIS — Z79899 Other long term (current) drug therapy: Secondary | ICD-10-CM | POA: Diagnosis not present

## 2018-06-14 DIAGNOSIS — R079 Chest pain, unspecified: Secondary | ICD-10-CM | POA: Diagnosis not present

## 2018-06-14 DIAGNOSIS — R51 Headache: Secondary | ICD-10-CM | POA: Diagnosis not present

## 2018-06-14 DIAGNOSIS — F329 Major depressive disorder, single episode, unspecified: Secondary | ICD-10-CM | POA: Diagnosis not present

## 2018-06-14 DIAGNOSIS — I10 Essential (primary) hypertension: Secondary | ICD-10-CM | POA: Diagnosis not present

## 2018-06-15 ENCOUNTER — Ambulatory Visit (HOSPITAL_COMMUNITY): Payer: PPO | Admitting: Psychiatry

## 2018-06-15 DIAGNOSIS — F17200 Nicotine dependence, unspecified, uncomplicated: Secondary | ICD-10-CM | POA: Diagnosis not present

## 2018-06-15 DIAGNOSIS — E6609 Other obesity due to excess calories: Secondary | ICD-10-CM | POA: Diagnosis not present

## 2018-06-15 DIAGNOSIS — Z6836 Body mass index (BMI) 36.0-36.9, adult: Secondary | ICD-10-CM | POA: Diagnosis not present

## 2018-06-15 DIAGNOSIS — I10 Essential (primary) hypertension: Secondary | ICD-10-CM | POA: Diagnosis not present

## 2018-06-15 DIAGNOSIS — R079 Chest pain, unspecified: Secondary | ICD-10-CM | POA: Diagnosis not present

## 2018-06-18 DIAGNOSIS — R4020 Unspecified coma: Secondary | ICD-10-CM | POA: Diagnosis not present

## 2018-06-18 DIAGNOSIS — Z8709 Personal history of other diseases of the respiratory system: Secondary | ICD-10-CM | POA: Diagnosis not present

## 2018-06-18 DIAGNOSIS — R0689 Other abnormalities of breathing: Secondary | ICD-10-CM | POA: Diagnosis not present

## 2018-06-18 DIAGNOSIS — Y999 Unspecified external cause status: Secondary | ICD-10-CM | POA: Diagnosis not present

## 2018-06-18 DIAGNOSIS — R062 Wheezing: Secondary | ICD-10-CM | POA: Diagnosis not present

## 2018-06-18 DIAGNOSIS — R05 Cough: Secondary | ICD-10-CM | POA: Diagnosis not present

## 2018-06-18 DIAGNOSIS — R000 Tachycardia, unspecified: Secondary | ICD-10-CM | POA: Diagnosis not present

## 2018-06-18 DIAGNOSIS — R230 Cyanosis: Secondary | ICD-10-CM | POA: Diagnosis not present

## 2018-06-18 DIAGNOSIS — T402X1A Poisoning by other opioids, accidental (unintentional), initial encounter: Secondary | ICD-10-CM | POA: Diagnosis not present

## 2018-06-18 DIAGNOSIS — X58XXXA Exposure to other specified factors, initial encounter: Secondary | ICD-10-CM | POA: Diagnosis not present

## 2018-06-18 DIAGNOSIS — R0600 Dyspnea, unspecified: Secondary | ICD-10-CM | POA: Diagnosis not present

## 2018-06-22 ENCOUNTER — Other Ambulatory Visit: Payer: Self-pay

## 2018-06-22 NOTE — Patient Outreach (Signed)
[facility]) Care Management  06/22/2018  Scott [name] 23-Sep-1970 [id number]     Transition of Care Referral  Referral Date: 06/22/18 Referral Source: HTA D/C Report Date of Admission: unknown Diagnosis: unknown Date of Discharge: 06/15/18 [facility] Insurance: HTA    Outreach attempt #1 to patient. A male answered the phone and when asked to speak with patient she stated that he was not available.    Plan: RN CM will make outreach attempt to patient within 3-4 business days. RN CM will send unsuccessful outreach letter to patient.    Antionette Fairyoshanda Aragon Scarantino, RN,BSN,CCM [facility] Care Management Telephonic Care Management Coordinator Direct Phone: [phone] Toll Free: [phone] Fax: [phone]

## 2018-06-23 ENCOUNTER — Other Ambulatory Visit: Payer: Self-pay

## 2018-06-23 DIAGNOSIS — R079 Chest pain, unspecified: Secondary | ICD-10-CM | POA: Diagnosis not present

## 2018-06-23 DIAGNOSIS — R9431 Abnormal electrocardiogram [ECG] [EKG]: Secondary | ICD-10-CM | POA: Diagnosis not present

## 2018-06-23 DIAGNOSIS — R091 Pleurisy: Secondary | ICD-10-CM | POA: Diagnosis not present

## 2018-06-23 DIAGNOSIS — R072 Precordial pain: Secondary | ICD-10-CM | POA: Diagnosis not present

## 2018-06-23 NOTE — Patient Outreach (Signed)
[facility]) Care Management  06/23/2018  Scott [name] 05-06-1971 [id number]   Transition of Care Referral  Referral Date: 06/22/18 Referral Source: HTA D/C Report Date of Admission: unknown Diagnosis: unknown Date of Discharge: 06/15/18 [facility] Insurance: HTA   Outreach attempt #2 to patient. A male answered the phone and reported again that patient was not available.      Plan: RN CM will make outreach attempt to patient within 3-4 business days.    Antionette Fairyoshanda Marqus Macphee, RN,BSN,CCM [facility] Care Management Telephonic Care Management Coordinator Direct Phone: [phone] Toll Free: [phone] Fax: [phone]

## 2018-06-26 ENCOUNTER — Other Ambulatory Visit: Payer: Self-pay

## 2018-06-26 NOTE — Patient Outreach (Signed)
[facility]) Care Management  06/26/2018  Jennette Kettleelson L Bohnenkamp Feb 22, 1971 [id number]   Transition of Care Referral  Referral Date:06/22/18 Referral Source:HTA D/C Report Date of Admission:unknown Diagnosis:unknown Date of Discharge:06/15/18 Facility:[facility] Insurance:HTA   Outreach attempt #3 to patient. No answer at present.     Plan: RN CM will close case if no response from letter mailed to patient.    Antionette Fairyoshanda Annika Selke, RN,BSN,CCM [facility] Care Management Telephonic Care Management Coordinator Direct Phone: [phone] Toll Free: [phone] Fax: [phone]

## 2018-07-05 ENCOUNTER — Other Ambulatory Visit: Payer: Self-pay

## 2018-07-05 NOTE — Patient Outreach (Signed)
[facility]) Care Management  07/05/2018  Scott [name] 1970/10/05 [id number]   Transition of Care Referral  Referral Date:06/22/18 Referral Source:HTA D/C Report Date of Admission:unknown Diagnosis:unknown Date of Discharge:06/15/18 Facility:[facility] Insurance:HTA    Multiple attempts to establish contact with patient without success. No response from letter mailed to patient. Case is being closed at this time.     Plan: RN CM will close case at this time. RN CM unable to send MD letter as no PCP on file.    Scott Fairyoshanda Keyen Marban, RN,BSN,CCM [facility] Care Management Telephonic Care Management Coordinator Direct Phone: [phone] Toll Free: [phone] Fax: [phone]

## 2018-07-07 DIAGNOSIS — R0789 Other chest pain: Secondary | ICD-10-CM | POA: Diagnosis not present

## 2018-07-07 DIAGNOSIS — I10 Essential (primary) hypertension: Secondary | ICD-10-CM | POA: Diagnosis not present

## 2018-07-07 DIAGNOSIS — R079 Chest pain, unspecified: Secondary | ICD-10-CM | POA: Diagnosis not present

## 2018-07-17 DIAGNOSIS — N3090 Cystitis, unspecified without hematuria: Secondary | ICD-10-CM | POA: Diagnosis not present

## 2018-07-17 DIAGNOSIS — F1410 Cocaine abuse, uncomplicated: Secondary | ICD-10-CM | POA: Diagnosis not present

## 2018-07-17 DIAGNOSIS — I252 Old myocardial infarction: Secondary | ICD-10-CM | POA: Diagnosis not present

## 2018-07-17 DIAGNOSIS — N3091 Cystitis, unspecified with hematuria: Secondary | ICD-10-CM | POA: Diagnosis not present

## 2018-07-17 DIAGNOSIS — Z888 Allergy status to other drugs, medicaments and biological substances status: Secondary | ICD-10-CM | POA: Diagnosis not present

## 2018-07-17 DIAGNOSIS — Z885 Allergy status to narcotic agent status: Secondary | ICD-10-CM | POA: Diagnosis not present

## 2018-07-17 DIAGNOSIS — S6991XA Unspecified injury of right wrist, hand and finger(s), initial encounter: Secondary | ICD-10-CM | POA: Diagnosis not present

## 2018-07-17 DIAGNOSIS — F1020 Alcohol dependence, uncomplicated: Secondary | ICD-10-CM | POA: Diagnosis not present

## 2018-07-17 DIAGNOSIS — G8929 Other chronic pain: Secondary | ICD-10-CM | POA: Diagnosis not present

## 2018-07-17 DIAGNOSIS — R9341 Abnormal radiologic findings on diagnostic imaging of renal pelvis, ureter, or bladder: Secondary | ICD-10-CM | POA: Diagnosis not present

## 2018-07-17 DIAGNOSIS — N200 Calculus of kidney: Secondary | ICD-10-CM | POA: Diagnosis not present

## 2018-07-17 DIAGNOSIS — R162 Hepatomegaly with splenomegaly, not elsewhere classified: Secondary | ICD-10-CM | POA: Diagnosis not present

## 2018-07-17 DIAGNOSIS — I10 Essential (primary) hypertension: Secondary | ICD-10-CM | POA: Diagnosis not present

## 2018-07-17 DIAGNOSIS — F329 Major depressive disorder, single episode, unspecified: Secondary | ICD-10-CM | POA: Diagnosis not present

## 2018-07-17 DIAGNOSIS — S61230A Puncture wound without foreign body of right index finger without damage to nail, initial encounter: Secondary | ICD-10-CM | POA: Diagnosis not present

## 2018-07-17 DIAGNOSIS — Z91040 Latex allergy status: Secondary | ICD-10-CM | POA: Diagnosis not present

## 2018-07-17 DIAGNOSIS — Y903 Blood alcohol level of 60-79 mg/100 ml: Secondary | ICD-10-CM | POA: Diagnosis not present

## 2018-07-17 DIAGNOSIS — G40909 Epilepsy, unspecified, not intractable, without status epilepticus: Secondary | ICD-10-CM | POA: Diagnosis not present

## 2018-07-17 DIAGNOSIS — Z79899 Other long term (current) drug therapy: Secondary | ICD-10-CM | POA: Diagnosis not present

## 2018-07-17 DIAGNOSIS — S61239A Puncture wound without foreign body of unspecified finger without damage to nail, initial encounter: Secondary | ICD-10-CM | POA: Diagnosis not present

## 2018-07-17 DIAGNOSIS — F411 Generalized anxiety disorder: Secondary | ICD-10-CM | POA: Diagnosis not present

## 2018-07-17 DIAGNOSIS — F4310 Post-traumatic stress disorder, unspecified: Secondary | ICD-10-CM | POA: Diagnosis not present

## 2018-07-17 DIAGNOSIS — R05 Cough: Secondary | ICD-10-CM | POA: Diagnosis not present

## 2018-07-17 DIAGNOSIS — F602 Antisocial personality disorder: Secondary | ICD-10-CM | POA: Diagnosis not present

## 2018-07-17 DIAGNOSIS — J449 Chronic obstructive pulmonary disease, unspecified: Secondary | ICD-10-CM | POA: Diagnosis not present

## 2018-07-17 DIAGNOSIS — F419 Anxiety disorder, unspecified: Secondary | ICD-10-CM | POA: Diagnosis not present

## 2018-07-17 DIAGNOSIS — F10230 Alcohol dependence with withdrawal, uncomplicated: Secondary | ICD-10-CM | POA: Diagnosis not present

## 2018-07-17 DIAGNOSIS — W25XXXA Contact with sharp glass, initial encounter: Secondary | ICD-10-CM | POA: Diagnosis not present

## 2018-07-17 DIAGNOSIS — Z87892 Personal history of anaphylaxis: Secondary | ICD-10-CM | POA: Diagnosis not present

## 2018-07-17 DIAGNOSIS — Z87891 Personal history of nicotine dependence: Secondary | ICD-10-CM | POA: Diagnosis not present

## 2018-07-17 DIAGNOSIS — K5730 Diverticulosis of large intestine without perforation or abscess without bleeding: Secondary | ICD-10-CM | POA: Diagnosis not present

## 2018-07-17 DIAGNOSIS — S61232A Puncture wound without foreign body of right middle finger without damage to nail, initial encounter: Secondary | ICD-10-CM | POA: Diagnosis not present

## 2018-07-17 DIAGNOSIS — M549 Dorsalgia, unspecified: Secondary | ICD-10-CM | POA: Diagnosis not present

## 2018-07-17 DIAGNOSIS — R319 Hematuria, unspecified: Secondary | ICD-10-CM | POA: Diagnosis not present

## 2018-07-18 DIAGNOSIS — F17210 Nicotine dependence, cigarettes, uncomplicated: Secondary | ICD-10-CM | POA: Diagnosis not present

## 2018-07-18 DIAGNOSIS — F4310 Post-traumatic stress disorder, unspecified: Secondary | ICD-10-CM | POA: Diagnosis not present

## 2018-07-18 DIAGNOSIS — G8929 Other chronic pain: Secondary | ICD-10-CM | POA: Diagnosis not present

## 2018-07-18 DIAGNOSIS — F1410 Cocaine abuse, uncomplicated: Secondary | ICD-10-CM | POA: Diagnosis not present

## 2018-07-18 DIAGNOSIS — F1020 Alcohol dependence, uncomplicated: Secondary | ICD-10-CM | POA: Diagnosis not present

## 2018-07-18 DIAGNOSIS — F250 Schizoaffective disorder, bipolar type: Secondary | ICD-10-CM | POA: Diagnosis not present

## 2018-07-18 DIAGNOSIS — Z9119 Patient's noncompliance with other medical treatment and regimen: Secondary | ICD-10-CM | POA: Diagnosis not present

## 2018-07-18 DIAGNOSIS — F602 Antisocial personality disorder: Secondary | ICD-10-CM | POA: Diagnosis not present

## 2018-07-18 DIAGNOSIS — F1010 Alcohol abuse, uncomplicated: Secondary | ICD-10-CM | POA: Diagnosis not present

## 2018-07-18 DIAGNOSIS — M549 Dorsalgia, unspecified: Secondary | ICD-10-CM | POA: Diagnosis not present

## 2018-07-18 DIAGNOSIS — Z62810 Personal history of physical and sexual abuse in childhood: Secondary | ICD-10-CM | POA: Diagnosis not present

## 2018-07-18 DIAGNOSIS — F411 Generalized anxiety disorder: Secondary | ICD-10-CM | POA: Diagnosis not present

## 2018-07-18 DIAGNOSIS — M1990 Unspecified osteoarthritis, unspecified site: Secondary | ICD-10-CM | POA: Diagnosis not present

## 2018-07-18 DIAGNOSIS — F332 Major depressive disorder, recurrent severe without psychotic features: Secondary | ICD-10-CM | POA: Diagnosis not present

## 2018-07-18 DIAGNOSIS — B1920 Unspecified viral hepatitis C without hepatic coma: Secondary | ICD-10-CM | POA: Diagnosis not present

## 2018-07-26 DIAGNOSIS — F332 Major depressive disorder, recurrent severe without psychotic features: Secondary | ICD-10-CM | POA: Diagnosis not present

## 2018-07-26 DIAGNOSIS — F1020 Alcohol dependence, uncomplicated: Secondary | ICD-10-CM | POA: Diagnosis not present

## 2018-07-28 ENCOUNTER — Other Ambulatory Visit: Payer: Self-pay

## 2018-07-28 NOTE — Patient Outreach (Signed)
[facility]) Care Management  07/28/2018  Scott [name] [birth year] [id number]   Referral Date: 07/28/18 Referral Source: HTA report Date of Admission: unknown Diagnosis: unknown Date of Discharge: 07/26/18 [facility] Insurance:  HTA  Outreach attempt: No answer.  HIPAA compliant voice message left.   Plan: RN CM will attempt patient again within 4 business days and send letter.     [name], RN, MSN [facility] Care Management Care Management Coordinator Direct Line [phone] Toll Free: [phone]  Fax: [phone]

## 2018-07-31 ENCOUNTER — Other Ambulatory Visit: Payer: Self-pay

## 2018-07-31 NOTE — Patient Outreach (Signed)
[facility]) Care Management  07/31/2018  Jennette Kettleelson L Canizares [birth year] [id number]   Referral Date: 07/28/18 Referral Source: HTA report Date of Admission: unknown Diagnosis: unknown Date of Discharge: 07/26/18 [facility] Insurance:  HTA  Outreach attempt: No answer.  HIPAA compliant voice message left.   Plan: RN CM will attempt patient again within 4 business days.  Bary Lericheionne J Meilah Delrosario, RN, MSN [facility] Care Management Care Management Coordinator Direct Line [phone] Cell [phone] Toll Free: [phone]  Fax: [phone]

## 2018-08-01 ENCOUNTER — Other Ambulatory Visit: Payer: Self-pay

## 2018-08-01 NOTE — Patient Outreach (Signed)
[facility]) Care Management  08/01/2018  Scott [name] 03/12/1971 [id number]   Referral Date:07/28/18 Referral Source:HTA report Date of Admission:unknown Diagnosis:unknown Date of Discharge:07/26/18 [facility] Insurance:HTA  Outreach attempt:No answer. HIPAA compliant voice message left.   Plan: RN CM will wait return call.  If no return call will close case.  [name], RN, MSN [facility] Care Management Care Management Coordinator Direct Line [phone] Cell [phone] Toll Free: [phone]  Fax: [phone]

## 2018-08-11 ENCOUNTER — Other Ambulatory Visit: Payer: Self-pay

## 2018-08-11 NOTE — Patient Outreach (Signed)
[facility] Care Management  08/11/2018  Scott [name] Mar 21, 1971 [id number]   Multiple attempts to establish contact with patient without success. No response from letter mailed to patient.   Plan: RN CM will close case at this time.   [name], RN, MSN [facility] Care Management Care Management Coordinator Direct Line [phone] Cell [phone] Toll Free: [phone]  Fax: [phone]

## 2018-08-20 DIAGNOSIS — F10239 Alcohol dependence with withdrawal, unspecified: Secondary | ICD-10-CM | POA: Diagnosis not present

## 2018-08-20 DIAGNOSIS — I10 Essential (primary) hypertension: Secondary | ICD-10-CM | POA: Diagnosis not present

## 2018-08-20 DIAGNOSIS — R7989 Other specified abnormal findings of blood chemistry: Secondary | ICD-10-CM | POA: Diagnosis not present

## 2018-08-20 DIAGNOSIS — R740 Nonspecific elevation of levels of transaminase and lactic acid dehydrogenase [LDH]: Secondary | ICD-10-CM | POA: Diagnosis not present

## 2018-08-20 DIAGNOSIS — J9811 Atelectasis: Secondary | ICD-10-CM | POA: Diagnosis not present

## 2018-08-20 DIAGNOSIS — Z888 Allergy status to other drugs, medicaments and biological substances status: Secondary | ICD-10-CM | POA: Diagnosis not present

## 2018-08-20 DIAGNOSIS — E876 Hypokalemia: Secondary | ICD-10-CM | POA: Diagnosis not present

## 2018-08-20 DIAGNOSIS — Z87891 Personal history of nicotine dependence: Secondary | ICD-10-CM | POA: Diagnosis not present

## 2018-08-20 DIAGNOSIS — F17200 Nicotine dependence, unspecified, uncomplicated: Secondary | ICD-10-CM | POA: Diagnosis not present

## 2018-08-20 DIAGNOSIS — I252 Old myocardial infarction: Secondary | ICD-10-CM | POA: Diagnosis not present

## 2018-08-20 DIAGNOSIS — F329 Major depressive disorder, single episode, unspecified: Secondary | ICD-10-CM | POA: Diagnosis not present

## 2018-08-20 DIAGNOSIS — K760 Fatty (change of) liver, not elsewhere classified: Secondary | ICD-10-CM | POA: Diagnosis not present

## 2018-08-20 DIAGNOSIS — F1910 Other psychoactive substance abuse, uncomplicated: Secondary | ICD-10-CM | POA: Diagnosis not present

## 2018-08-20 DIAGNOSIS — F1020 Alcohol dependence, uncomplicated: Secondary | ICD-10-CM | POA: Diagnosis not present

## 2018-08-20 DIAGNOSIS — F1410 Cocaine abuse, uncomplicated: Secondary | ICD-10-CM | POA: Diagnosis not present

## 2018-08-20 DIAGNOSIS — J449 Chronic obstructive pulmonary disease, unspecified: Secondary | ICD-10-CM | POA: Diagnosis not present

## 2018-08-20 DIAGNOSIS — F10230 Alcohol dependence with withdrawal, uncomplicated: Secondary | ICD-10-CM | POA: Diagnosis not present

## 2018-08-20 DIAGNOSIS — R079 Chest pain, unspecified: Secondary | ICD-10-CM | POA: Diagnosis not present

## 2018-08-20 DIAGNOSIS — Z79899 Other long term (current) drug therapy: Secondary | ICD-10-CM | POA: Diagnosis not present

## 2018-08-20 DIAGNOSIS — R161 Splenomegaly, not elsewhere classified: Secondary | ICD-10-CM | POA: Diagnosis not present

## 2018-08-20 DIAGNOSIS — Z91040 Latex allergy status: Secondary | ICD-10-CM | POA: Diagnosis not present

## 2018-08-20 DIAGNOSIS — F4310 Post-traumatic stress disorder, unspecified: Secondary | ICD-10-CM | POA: Diagnosis not present

## 2018-08-20 DIAGNOSIS — R51 Headache: Secondary | ICD-10-CM | POA: Diagnosis not present

## 2018-08-20 DIAGNOSIS — F419 Anxiety disorder, unspecified: Secondary | ICD-10-CM | POA: Diagnosis not present

## 2018-08-27 ENCOUNTER — Emergency Department (HOSPITAL_COMMUNITY)
Admission: EM | Admit: 2018-08-27 | Discharge: 2018-08-28 | Disposition: A | Discharge status: Other Acute Inpatient Hospital | Payer: PPO | Attending: Emergency Medicine | Admitting: Emergency Medicine

## 2018-08-27 ENCOUNTER — Other Ambulatory Visit: Payer: Self-pay

## 2018-08-27 ENCOUNTER — Encounter (HOSPITAL_COMMUNITY): Payer: Self-pay | Admitting: Emergency Medicine

## 2018-08-27 DIAGNOSIS — F17210 Nicotine dependence, cigarettes, uncomplicated: Secondary | ICD-10-CM | POA: Insufficient documentation

## 2018-08-27 DIAGNOSIS — F333 Major depressive disorder, recurrent, severe with psychotic symptoms: Secondary | ICD-10-CM | POA: Diagnosis not present

## 2018-08-27 DIAGNOSIS — Z79899 Other long term (current) drug therapy: Secondary | ICD-10-CM | POA: Diagnosis not present

## 2018-08-27 DIAGNOSIS — R000 Tachycardia, unspecified: Secondary | ICD-10-CM | POA: Diagnosis not present

## 2018-08-27 DIAGNOSIS — F4310 Post-traumatic stress disorder, unspecified: Secondary | ICD-10-CM | POA: Diagnosis not present

## 2018-08-27 DIAGNOSIS — Z91040 Latex allergy status: Secondary | ICD-10-CM | POA: Insufficient documentation

## 2018-08-27 DIAGNOSIS — R45851 Suicidal ideations: Secondary | ICD-10-CM | POA: Diagnosis not present

## 2018-08-27 DIAGNOSIS — F1110 Opioid abuse, uncomplicated: Secondary | ICD-10-CM | POA: Insufficient documentation

## 2018-08-27 DIAGNOSIS — F419 Anxiety disorder, unspecified: Secondary | ICD-10-CM | POA: Diagnosis not present

## 2018-08-27 DIAGNOSIS — F1020 Alcohol dependence, uncomplicated: Secondary | ICD-10-CM | POA: Diagnosis not present

## 2018-08-27 DIAGNOSIS — T1491XA Suicide attempt, initial encounter: Secondary | ICD-10-CM

## 2018-08-27 DIAGNOSIS — F99 Mental disorder, not otherwise specified: Secondary | ICD-10-CM | POA: Diagnosis present

## 2018-08-27 DIAGNOSIS — I252 Old myocardial infarction: Secondary | ICD-10-CM | POA: Insufficient documentation

## 2018-08-27 LAB — CBC WITH DIFFERENTIAL/PLATELET
Abs Immature Granulocytes: 0.02 10*3/uL (ref 0.00–0.07)
Basophils Absolute: 0.1 10*3/uL (ref 0.0–0.1)
Basophils Relative: 1 %
Eosinophils Absolute: 0.1 10*3/uL (ref 0.0–0.5)
Eosinophils Relative: 2 %
HCT: 42.4 % (ref 39.0–52.0)
Hemoglobin: 15 g/dL (ref 13.0–17.0)
Immature Granulocytes: 0 %
Lymphocytes Relative: 26 %
Lymphs Abs: 1.8 10*3/uL (ref 0.7–4.0)
MCH: 32.5 pg (ref 26.0–34.0)
MCHC: 35.4 g/dL (ref 30.0–36.0)
MCV: 92 fL (ref 80.0–100.0)
Monocytes Absolute: 0.6 10*3/uL (ref 0.1–1.0)
Monocytes Relative: 8 %
Neutro Abs: 4.3 10*3/uL (ref 1.7–7.7)
Neutrophils Relative %: 63 %
Platelets: 207 10*3/uL (ref 150–400)
RBC: 4.61 MIL/uL (ref 4.22–5.81)
RDW: 12.5 % (ref 11.5–15.5)
WBC: 6.8 10*3/uL (ref 4.0–10.5)
nRBC: 0 % (ref 0.0–0.2)

## 2018-08-27 LAB — COMPREHENSIVE METABOLIC PANEL
ALT: 396 U/L — ABNORMAL HIGH (ref 0–44)
AST: 244 U/L — ABNORMAL HIGH (ref 15–41)
Albumin: 3.7 g/dL (ref 3.5–5.0)
Alkaline Phosphatase: 63 U/L (ref 38–126)
Anion gap: 13 (ref 5–15)
BUN: 8 mg/dL (ref 6–20)
CO2: 22 mmol/L (ref 22–32)
Calcium: 8.3 mg/dL — ABNORMAL LOW (ref 8.9–10.3)
Chloride: 106 mmol/L (ref 98–111)
Creatinine, Ser: 0.88 mg/dL (ref 0.61–1.24)
GFR calc Af Amer: >60 mL/min (ref >60)
GFR calc non Af Amer: >60 mL/min (ref >60)
Glucose, Bld: 111 mg/dL — ABNORMAL HIGH (ref 70–99)
Potassium: 3.2 mmol/L — ABNORMAL LOW (ref 3.5–5.1)
Sodium: 141 mmol/L (ref 135–145)
Total Bilirubin: 1.7 mg/dL — ABNORMAL HIGH (ref 0.3–1.2)
Total Protein: 6.6 g/dL (ref 6.5–8.1)

## 2018-08-27 LAB — I-STAT CHEM 8, ED
BUN: 9 mg/dL (ref 6–20)
Calcium, Ion: 1.06 mmol/L — ABNORMAL LOW (ref 1.15–1.40)
Chloride: 104 mmol/L (ref 98–111)
Creatinine, Ser: 0.8 mg/dL (ref 0.61–1.24)
Glucose, Bld: 109 mg/dL — ABNORMAL HIGH (ref 70–99)
HCT: 42 % (ref 39.0–52.0)
Hemoglobin: 14.3 g/dL (ref 13.0–17.0)
Potassium: 3.1 mmol/L — ABNORMAL LOW (ref 3.5–5.1)
Sodium: 142 mmol/L (ref 135–145)
TCO2: 26 mmol/L (ref 22–32)

## 2018-08-27 LAB — SALICYLATE LEVEL: Salicylate Lvl: <7 mg/dL (ref 2.8–30.0)

## 2018-08-27 LAB — ACETAMINOPHEN LEVEL
Acetaminophen (Tylenol), Serum: <10 ug/mL — ABNORMAL LOW (ref 10–30)
Acetaminophen (Tylenol), Serum: <10 ug/mL — ABNORMAL LOW (ref 10–30)

## 2018-08-27 LAB — LIPASE, BLOOD: Lipase: 29 U/L (ref 11–51)

## 2018-08-27 LAB — I-STAT TROPONIN, ED
Troponin i, poc: 0 ng/mL (ref 0.00–0.08)
Troponin i, poc: 0 ng/mL (ref 0.00–0.08)

## 2018-08-27 LAB — ETHANOL: Alcohol, Ethyl (B): <10 mg/dL (ref <10)

## 2018-08-27 LAB — RAPID URINE DRUG SCREEN, HOSP PERFORMED
Amphetamines: NOT DETECTED
Barbiturates: NOT DETECTED
Benzodiazepines: POSITIVE — AB
Cocaine: POSITIVE — AB
Opiates: NOT DETECTED
Tetrahydrocannabinol: NOT DETECTED

## 2018-08-27 LAB — MAGNESIUM: Magnesium: 2 mg/dL (ref 1.7–2.4)

## 2018-08-27 MED ORDER — LORAZEPAM 2 MG/ML IJ SOLN: 0.0000 mg | Freq: Two times a day (BID) | INTRAMUSCULAR | Status: DC

## 2018-08-27 MED ORDER — ONDANSETRON HCL 4 MG PO TABS: 4.0000 mg | ORAL_TABLET | Freq: Three times a day (TID) | ORAL | Status: DC | PRN

## 2018-08-27 MED ORDER — LORAZEPAM 2 MG/ML IJ SOLN
1.0000 mg | Freq: Once | INTRAMUSCULAR | Status: AC
  Administered 2018-08-27: 1 mg via INTRAVENOUS
  Filled 2018-08-27: qty 1

## 2018-08-27 MED ORDER — THIAMINE HCL 100 MG/ML IJ SOLN: 100.0000 mg | Freq: Every day | INTRAMUSCULAR | Status: DC

## 2018-08-27 MED ORDER — LORAZEPAM 1 MG PO TABS: 0.0000 mg | ORAL_TABLET | Freq: Two times a day (BID) | ORAL | Status: DC

## 2018-08-27 MED ORDER — LORAZEPAM 2 MG/ML IJ SOLN
0.0000 mg | Freq: Four times a day (QID) | INTRAMUSCULAR | Status: DC
  Administered 2018-08-27: 2 mg via INTRAVENOUS
  Filled 2018-08-27: qty 1

## 2018-08-27 MED ORDER — ACETAMINOPHEN 325 MG PO TABS: 650.0000 mg | ORAL_TABLET | Freq: Once | ORAL | Status: DC

## 2018-08-27 MED ORDER — SODIUM CHLORIDE 0.9 % IV BOLUS
1000.0000 mL | Freq: Once | INTRAVENOUS | Status: AC
  Administered 2018-08-27: 1000 mL via INTRAVENOUS

## 2018-08-27 MED ORDER — POTASSIUM CHLORIDE CRYS ER 20 MEQ PO TBCR
40.0000 meq | EXTENDED_RELEASE_TABLET | Freq: Once | ORAL | Status: AC
  Administered 2018-08-27: 40 meq via ORAL
  Filled 2018-08-27: qty 2

## 2018-08-27 MED ORDER — VITAMIN B-1 100 MG PO TABS
100.0000 mg | ORAL_TABLET | Freq: Every day | ORAL | Status: DC
  Administered 2018-08-27: 100 mg via ORAL
  Filled 2018-08-27: qty 1

## 2018-08-27 MED ORDER — LORAZEPAM 1 MG PO TABS
0.0000 mg | ORAL_TABLET | Freq: Four times a day (QID) | ORAL | Status: DC
  Administered 2018-08-28: 1 mg via ORAL
  Filled 2018-08-27: qty 1

## 2018-08-27 MED ORDER — NICOTINE 21 MG/24HR TD PT24
21.0000 mg | MEDICATED_PATCH | Freq: Every day | TRANSDERMAL | Status: DC
  Administered 2018-08-28: 21 mg via TRANSDERMAL
  Filled 2018-08-27: qty 1

## 2018-08-27 MED ORDER — ALUM & MAG HYDROXIDE-SIMETH 200-200-20 MG/5ML PO SUSP: 30.0000 mL | Freq: Four times a day (QID) | ORAL | Status: DC | PRN

## 2018-08-27 NOTE — ED Notes (Signed)
IVC paperwork faxed to magistrate.  

## 2018-08-27 NOTE — ED Triage Notes (Signed)
Patient reports he is suicidal and has had "300 dollars worth of cocaine and 1/2 gallon of whiskey." states he drinks daily for years. Patient states behavioral health knows him very well. Patient states he hear voices. Been off of medication for 2 weeks.

## 2018-08-27 NOTE — ED Notes (Signed)
TTS in progress Pt very upset he is only being offered tylenol for pain, pt reports when he is seen at [facility]) they offer Dilaudid for pain, pt states he is disappointed in our lack of empathy.  Attempts made to explain to patient that we do not offer opiates for chronic pain control and patient is not on a home regiment of narcotic medication.  Pt reports last time he had narcotics was from [place], stating "They never deny me pain medication"
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
## 2018-08-27 NOTE — ED Notes (Signed)
Pt requesting pain medication, pt reports he drinks etoh for pain control at home, pt reports he feels distraught d/t pain in back and legs. Pt reports he can take NSAIDS, Prednisone or Benadryl. Pt reports he has taken Opana 20mg  in the past that has helped.

## 2018-08-28 ENCOUNTER — Inpatient Hospital Stay (HOSPITAL_COMMUNITY)
Admission: AD | Admit: 2018-08-28 | Discharge: 2018-09-01 | DRG: 885 | Disposition: A | Discharge status: Home / Self Care | Payer: PPO | Source: Intra-hospital | Attending: Psychiatry | Admitting: Psychiatry

## 2018-08-28 ENCOUNTER — Other Ambulatory Visit: Payer: Self-pay

## 2018-08-28 ENCOUNTER — Encounter (HOSPITAL_COMMUNITY): Payer: Self-pay | Admitting: *Deleted

## 2018-08-28 DIAGNOSIS — M549 Dorsalgia, unspecified: Secondary | ICD-10-CM | POA: Diagnosis present

## 2018-08-28 DIAGNOSIS — G8929 Other chronic pain: Secondary | ICD-10-CM | POA: Diagnosis present

## 2018-08-28 DIAGNOSIS — R45851 Suicidal ideations: Secondary | ICD-10-CM | POA: Diagnosis not present

## 2018-08-28 DIAGNOSIS — F411 Generalized anxiety disorder: Secondary | ICD-10-CM | POA: Diagnosis not present

## 2018-08-28 DIAGNOSIS — F410 Panic disorder [episodic paroxysmal anxiety] without agoraphobia: Secondary | ICD-10-CM | POA: Diagnosis not present

## 2018-08-28 DIAGNOSIS — Z8249 Family history of ischemic heart disease and other diseases of the circulatory system: Secondary | ICD-10-CM | POA: Diagnosis not present

## 2018-08-28 DIAGNOSIS — F1310 Sedative, hypnotic or anxiolytic abuse, uncomplicated: Secondary | ICD-10-CM | POA: Diagnosis present

## 2018-08-28 DIAGNOSIS — F1010 Alcohol abuse, uncomplicated: Secondary | ICD-10-CM | POA: Diagnosis not present

## 2018-08-28 DIAGNOSIS — F1410 Cocaine abuse, uncomplicated: Secondary | ICD-10-CM | POA: Diagnosis present

## 2018-08-28 DIAGNOSIS — F419 Anxiety disorder, unspecified: Secondary | ICD-10-CM

## 2018-08-28 DIAGNOSIS — G4700 Insomnia, unspecified: Secondary | ICD-10-CM | POA: Diagnosis present

## 2018-08-28 DIAGNOSIS — F609 Personality disorder, unspecified: Secondary | ICD-10-CM | POA: Diagnosis present

## 2018-08-28 DIAGNOSIS — F4310 Post-traumatic stress disorder, unspecified: Secondary | ICD-10-CM | POA: Diagnosis present

## 2018-08-28 DIAGNOSIS — I252 Old myocardial infarction: Secondary | ICD-10-CM | POA: Diagnosis not present

## 2018-08-28 DIAGNOSIS — F332 Major depressive disorder, recurrent severe without psychotic features: Secondary | ICD-10-CM | POA: Diagnosis not present

## 2018-08-28 DIAGNOSIS — Z79899 Other long term (current) drug therapy: Secondary | ICD-10-CM | POA: Diagnosis not present

## 2018-08-28 DIAGNOSIS — Z818 Family history of other mental and behavioral disorders: Secondary | ICD-10-CM | POA: Diagnosis not present

## 2018-08-28 DIAGNOSIS — K219 Gastro-esophageal reflux disease without esophagitis: Secondary | ICD-10-CM | POA: Diagnosis not present

## 2018-08-28 DIAGNOSIS — F17210 Nicotine dependence, cigarettes, uncomplicated: Secondary | ICD-10-CM | POA: Diagnosis present

## 2018-08-28 DIAGNOSIS — F333 Major depressive disorder, recurrent, severe with psychotic symptoms: Secondary | ICD-10-CM | POA: Diagnosis not present

## 2018-08-28 LAB — COMPREHENSIVE METABOLIC PANEL
ALT: 360 U/L — ABNORMAL HIGH (ref 0–44)
AST: 217 U/L — ABNORMAL HIGH (ref 15–41)
Albumin: 3.5 g/dL (ref 3.5–5.0)
Alkaline Phosphatase: 70 U/L (ref 38–126)
Anion gap: 8 (ref 5–15)
BUN: 11 mg/dL (ref 6–20)
CO2: 25 mmol/L (ref 22–32)
Calcium: 8.6 mg/dL — ABNORMAL LOW (ref 8.9–10.3)
Chloride: 108 mmol/L (ref 98–111)
Creatinine, Ser: 0.73 mg/dL (ref 0.61–1.24)
GFR calc Af Amer: >60 mL/min (ref >60)
GFR calc non Af Amer: >60 mL/min (ref >60)
Glucose, Bld: 106 mg/dL — ABNORMAL HIGH (ref 70–99)
Potassium: 3.9 mmol/L (ref 3.5–5.1)
Sodium: 141 mmol/L (ref 135–145)
Total Bilirubin: 1.2 mg/dL (ref 0.3–1.2)
Total Protein: 6.3 g/dL — ABNORMAL LOW (ref 6.5–8.1)

## 2018-08-28 MED ORDER — HYDROXYZINE HCL 25 MG PO TABS: 25.0000 mg | ORAL_TABLET | Freq: Four times a day (QID) | ORAL | Status: AC | PRN

## 2018-08-28 MED ORDER — LORAZEPAM 1 MG PO TABS
1.0000 mg | ORAL_TABLET | Freq: Once | ORAL | Status: AC
  Administered 2018-08-28: 1 mg via ORAL
  Filled 2018-08-28: qty 1

## 2018-08-28 MED ORDER — SERTRALINE HCL 100 MG PO TABS
100.0000 mg | ORAL_TABLET | Freq: Every day | ORAL | Status: DC
  Administered 2018-08-28 – 2018-08-29 (×2): 100 mg via ORAL
  Filled 2018-08-28 (×5): qty 1

## 2018-08-28 MED ORDER — ACETAMINOPHEN 325 MG PO TABS: 650.0000 mg | ORAL_TABLET | Freq: Four times a day (QID) | ORAL | Status: DC | PRN

## 2018-08-28 MED ORDER — QUETIAPINE FUMARATE 50 MG PO TABS
50.0000 mg | ORAL_TABLET | Freq: Every day | ORAL | Status: DC
  Administered 2018-08-28 – 2018-08-31 (×4): 50 mg via ORAL
  Filled 2018-08-28 (×7): qty 1

## 2018-08-28 MED ORDER — LOPERAMIDE HCL 2 MG PO CAPS: 2.0000 mg | ORAL_CAPSULE | ORAL | Status: AC | PRN

## 2018-08-28 MED ORDER — BUSPIRONE HCL 10 MG PO TABS
10.0000 mg | ORAL_TABLET | Freq: Three times a day (TID) | ORAL | Status: DC
  Administered 2018-08-28 – 2018-09-01 (×12): 10 mg via ORAL
  Filled 2018-08-28 (×3): qty 1
  Filled 2018-08-28: qty 2
  Filled 2018-08-28 (×9): qty 1
  Filled 2018-08-28: qty 2
  Filled 2018-08-28 (×6): qty 1

## 2018-08-28 MED ORDER — QUETIAPINE FUMARATE 100 MG PO TABS
100.0000 mg | ORAL_TABLET | Freq: Every day | ORAL | Status: DC
  Filled 2018-08-28 (×2): qty 1

## 2018-08-28 MED ORDER — ONDANSETRON 4 MG PO TBDP
4.0000 mg | ORAL_TABLET | Freq: Four times a day (QID) | ORAL | Status: AC | PRN
  Administered 2018-08-29 – 2018-08-30 (×3): 4 mg via ORAL
  Filled 2018-08-28 (×3): qty 1

## 2018-08-28 MED ORDER — NICOTINE 21 MG/24HR TD PT24
21.0000 mg | MEDICATED_PATCH | Freq: Every day | TRANSDERMAL | Status: DC
  Administered 2018-08-28 – 2018-08-31 (×4): 21 mg via TRANSDERMAL
  Filled 2018-08-28 (×7): qty 1

## 2018-08-28 MED ORDER — ALUM & MAG HYDROXIDE-SIMETH 200-200-20 MG/5ML PO SUSP
30.0000 mL | ORAL | Status: DC | PRN
  Administered 2018-08-29 – 2018-08-31 (×3): 30 mL via ORAL
  Filled 2018-08-28 (×3): qty 30

## 2018-08-28 MED ORDER — LORAZEPAM 1 MG PO TABS
1.0000 mg | ORAL_TABLET | Freq: Four times a day (QID) | ORAL | Status: AC | PRN
  Administered 2018-08-28 – 2018-08-30 (×9): 1 mg via ORAL
  Filled 2018-08-28 (×9): qty 1

## 2018-08-28 MED ORDER — SERTRALINE HCL 50 MG PO TABS
50.0000 mg | ORAL_TABLET | Freq: Every day | ORAL | Status: DC
  Filled 2018-08-28 (×2): qty 1

## 2018-08-28 MED ORDER — MAGNESIUM HYDROXIDE 400 MG/5ML PO SUSP: 30.0000 mL | Freq: Every day | ORAL | Status: DC | PRN

## 2018-08-28 MED ORDER — GABAPENTIN 300 MG PO CAPS
300.0000 mg | ORAL_CAPSULE | Freq: Three times a day (TID) | ORAL | Status: DC
  Administered 2018-08-28 – 2018-08-31 (×10): 300 mg via ORAL
  Filled 2018-08-28 (×17): qty 1

## 2018-08-28 NOTE — Tx Team (Signed)
Initial Treatment Plan 08/28/2018 2:20 AM Scott [name] [id number]    PATIENT STRESSORS: Health problems Substance abuse Traumatic event   PATIENT STRENGTHS: Average or above average intelligence Communication skills General fund of knowledge Supportive family/friends   PATIENT IDENTIFIED PROBLEMS:   "Change my way of thinking."  "I need severe long term care. None of this 30 day stuff."                 DISCHARGE CRITERIA:  Improved stabilization in mood, thinking, and/or behavior Need for constant or close observation no longer present Reduction of life-threatening or endangering symptoms to within safe limits Withdrawal symptoms are absent or subacute and managed without 24-hour nursing intervention  PRELIMINARY DISCHARGE PLAN: Attend 12-step recovery group Outpatient therapy Return to previous living arrangement  PATIENT/FAMILY INVOLVEMENT: This treatment plan has been presented to and reviewed with the patient, Scott [name], and/or family member.  The patient and family have been given the opportunity to ask questions and make suggestions.  Lawrence Marseilles, RN 08/28/2018, 2:20 AM

## 2018-08-28 NOTE — Progress Notes (Signed)
Recreation Therapy Notes  Date: 1.20.20   Time: 0930 Location: [address] Dayroom  Group Topic: Stress Management  Goal Area(s) Addresses:  Patient will identify stress management techniques. Patient will identify benefit of using stress management post d/c.   Behavioral Response: Engaged  Intervention: Stress Management  Activity : Guided Imagery.  LRT introduced the stress management technique of guided imagery.  LRT read a script that let patients envision laying outside in a meadow at summer time.  Patients were to listen and follow along as script was read to engage in activity.  Education:  Stress Management, Discharge Planning.   Education Outcome: Acknowledges Education  Clinical Observations/Feedback: Pt attended and participated group.    Tashiba Timoney, LRT/CTRS         Azari Janssens A 08/28/2018 12:03 PM 
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
## 2018-08-28 NOTE — ED Notes (Signed)
Walked in to take Pt's IV out and Pt was sitting on the end of his bed with his left wrist bleeding. Pt stated "I cut my wrist with my broken spoon". Pt had multiple superficial cuts on his left wrist. Broken spoon was sitting on Pts bedside table along with the top of a lab tube. Pt stated he tried to use the lab tube but he could not break it. Pt then stated, while I was getting his IV out, "I should have cut my throat instead so it would have worked better". Pt placed in hallway so staff can have continuous sight on Pt.

## 2018-08-28 NOTE — Progress Notes (Addendum)
Patient ID: Scott [name], male   DOB: 05/19/1971, 48 y.o.   MRN: [id number]  Received a call from [place] [place] with Poison Control [phone]). Reviewed patient's current vitals and rechecked LFTs. [name] reports [employer] is closing out patient's case and states he is cleared.
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
## 2018-08-28 NOTE — ED Provider Notes (Signed)
  12:41 AM Called to patient's bedside as he apparent broke a plastic spoon and cut his left wrist.  On exam, has multiple superficial abrasions to volar left wrist without active bleeding or deep tissue, vessel, or tendon involvement.  Tetanus UTD-- reports given last year.  Wound does not require formal repair.  Bacitracin and dressing applied.  Still medically cleared for psychiatric admission.   Garlon Hatchet, PA-C 08/28/18 0210    Ward, Layla Maw, DO 08/28/18 [phone]

## 2018-08-28 NOTE — [facility] Group Notes (Signed)
LCSW Group Therapy Note   08/28/2018 1:15pm   Type of Therapy and Topic:  Group Therapy:  Overcoming Obstacles   Participation Level:  Active   Description of Group:    In this group patients will be encouraged to explore what they see as obstacles to their own wellness and recovery. They will be guided to discuss their thoughts, feelings, and behaviors related to these obstacles. The group will process together ways to cope with barriers, with attention given to specific choices patients can make. Each patient will be challenged to identify changes they are motivated to make in order to overcome their obstacles. This group will be process-oriented, with patients participating in exploration of their own experiences as well as giving and receiving support and challenge from other group members.   Therapeutic Goals: 1. Patient will identify personal and current obstacles as they relate to admission. 2. Patient will identify barriers that currently interfere with their wellness or overcoming obstacles.  3. Patient will identify feelings, thought process and behaviors related to these barriers. 4. Patient will identify two changes they are willing to make to overcome these obstacles:      Summary of Patient Progress   Scott [name] was attentive and engaged during today's processing group. He shared that he is upset with the MD because "I have a seizure disorder and have been drinking heavily. I need more detox medication." Scott [name] shared that he is hoping to get into a long term program but understands that there are not many options. "I'm not going to [facility] or back to [employer] is open to an [facility] referral. He continues to show progress in the group setting with limited insight at this time. Pt continues to talk about his multiple personalities and disdain for certain staff at the hospital.    Therapeutic Modalities:   Cognitive Behavioral Therapy Solution Focused  Therapy Motivational Interviewing Relapse Prevention Therapy  Rona Ravens, LCSW 08/28/2018 2:48 PM

## 2018-08-28 NOTE — Progress Notes (Signed)
Adult Psychoeducational Group Note  Date:  08/28/2018 Time:  6:42 PM  Group Topic/Focus:  Emotional Education:   The focus of this group is to discuss what feelings/emotions are, and how they are experienced.  Participation Level:  Active  Participation Quality:  Appropriate  Affect:  Appropriate  Cognitive:  Alert and Appropriate  Insight: Appropriate, Good and Improving  Engagement in Group:  Engaged and Improving  Modes of Intervention:  Discussion  Additional Comments:  Pt attended group and participated in group discussion.  Scott [name] R [name] [name] 08/28/2018, 6:42 PM

## 2018-08-28 NOTE — Progress Notes (Signed)
Pt attended spiritual care group on grief and loss facilitated by chaplain Chin Wachter   Group opened with brief discussion and psycho-social ed around grief and loss in relationships and in relation to self - identifying life patterns, circumstances, changes that cause losses. Established group norm of speaking from own life experience. Group goal of establishing open and affirming space for members to share loss and experience with grief, normalize grief experience and provide psycho social education and grief support.     

## 2018-08-28 NOTE — Progress Notes (Signed)
Patient ID: Scott [name], male   DOB: June 11, 1971, 48 y.o.   MRN: [id number]  Nursing Progress Note [id number]  Data: On initial approach, patient is seen up in line for medication laughing and joking with peers in no acute distress. 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 Patient complaining of withdrawal symptoms. Patient informed no medications ordered at this time and Ativan not due (last given 0456). Patient informed he would need to speak to the doctor about medication changes. Patient verbalized understanding and was later seen playing cards in the dayroom in no distress. Patient is observed visible in the milieu. Patient currently reports passive SI but contracts for safety. Patient denies HI. Patient endorses voices but does not appear to writer to be responding to internal stimuli.   Action: Patient is educated about and provided medication per provider's orders. Patient safety maintained with q15 min safety checks and frequent rounding. High fall risk precautions in place. Emotional support given. 1:1 interaction and active listening provided. Patient encouraged to attend meals, groups, and work on treatment plan and goals. Labs, vital signs and patient behavior monitored throughout shift.   Response: Patient remains safe on the unit at this time and agrees to come to staff with any issues/concerns. Patient is interacting with peers appropriately on the unit. Will continue to support and monitor.
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
## 2018-08-28 NOTE — Progress Notes (Signed)
Received a call from Danielle with [employer] [phone]) who states LFTs need following until they reflect a downward trend. Acknowledged patient has reported alcohol abuse and hx of hepatitis infection however states in order for them to close out their case, labs need to be checked. NP Allyson Sabal contacted, order received for AM lab and entered.
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
## 2018-08-28 NOTE — Tx Team (Signed)
Interdisciplinary Treatment and Diagnostic Plan Update  08/28/2018 Time of Session: 0830AM Scott [name] MRN: [id number]  Principal Diagnosis: Severe recurrent major depression without psychotic features Secondary Diagnoses: Active Problems:   Severe recurrent major depression without psychotic features (HCC)   Current Medications:  Current Facility-Administered Medications  Medication Dose Route Frequency Provider Last Rate Last Dose  . acetaminophen (TYLENOL) tablet 650 mg  650 mg Oral Q6H PRN Rozetta Nunnery, NP      . alum & mag hydroxide-simeth (MAALOX/MYLANTA) 200-200-20 MG/5ML suspension 30 mL  30 mL Oral Q4H PRN Lindon Romp A, NP      . hydrOXYzine (ATARAX/VISTARIL) tablet 25 mg  25 mg Oral Q6H PRN Lindon Romp A, NP      . loperamide (IMODIUM) capsule 2-4 mg  2-4 mg Oral PRN Lindon Romp A, NP      . LORazepam (ATIVAN) tablet 1 mg  1 mg Oral Q6H PRN Lindon Romp A, NP   1 mg at 08/28/18 0456  . magnesium hydroxide (MILK OF MAGNESIA) suspension 30 mL  30 mL Oral Daily PRN Lindon Romp A, NP      . nicotine (NICODERM CQ - dosed in mg/24 hours) patch 21 mg  21 mg Transdermal Daily Sharma Covert, MD   21 mg at 08/28/18 0810  . ondansetron (ZOFRAN-ODT) disintegrating tablet 4 mg  4 mg Oral Q6H PRN Rozetta Nunnery, NP       PTA Medications: Medications Prior to Admission  Medication Sig Dispense Refill Last Dose  . busPIRone (BUSPAR) 5 MG tablet Take 1 tablet (5 mg total) by mouth 2 (two) times daily. For mood control 60 tablet 1   . carbamazepine (TEGRETOL) 200 MG tablet Take 1 tablet (200 mg total) by mouth 2 (two) times daily. For mood control 60 tablet 1   . gabapentin (NEURONTIN) 100 MG capsule Take 1 capsule (100 mg total) by mouth 3 (three) times daily. 90 capsule 1   . sertraline (ZOLOFT) 50 MG tablet Take 1 tablet (50 mg total) by mouth daily. For mood control 30 tablet 1     Patient Stressors: Health problems Substance abuse Traumatic event  Patient Strengths:  Average or above average intelligence Communication skills General fund of knowledge Supportive family/friends  Treatment Modalities: Medication Management, Group therapy, Case management,  1 to 1 session with clinician, Psychoeducation, Recreational therapy.   Physician Treatment Plan for Primary Diagnosis: Severe recurrent major depression without psychotic features  Long Term Goal(s):     Short Term Goals:    Medication Management: Evaluate patient's response, side effects, and tolerance of medication regimen.  Therapeutic Interventions: 1 to 1 sessions, Unit Group sessions and Medication administration.  Evaluation of Outcomes: Not Met  Physician Treatment Plan for Secondary Diagnosis: Active Problems:   Severe recurrent major depression without psychotic features ([place])  Long Term Goal(s):     Short Term Goals:       Medication Management: Evaluate patient's response, side effects, and tolerance of medication regimen.  Therapeutic Interventions: 1 to 1 sessions, Unit Group sessions and Medication administration.  Evaluation of Outcomes: Not Met   RN Treatment Plan for Primary Diagnosis: Severe recurrent major depression without psychotic features  Long Term Goal(s): Knowledge of disease and therapeutic regimen to maintain health will improve  Short Term Goals: Ability to demonstrate self-control, Ability to participate in decision making will improve, Ability to verbalize feelings will improve and Ability to disclose and discuss suicidal ideas  Medication Management: RN will administer medications  as ordered by provider, will assess and evaluate patient's response and provide education to patient for prescribed medication. RN will report any adverse and/or side effects to prescribing provider.  Therapeutic Interventions: 1 on 1 counseling sessions, Psychoeducation, Medication administration, Evaluate responses to treatment, Monitor vital signs and CBGs as ordered,  Perform/monitor CIWA, COWS, AIMS and Fall Risk screenings as ordered, Perform wound care treatments as ordered.  Evaluation of Outcomes: Not Met   LCSW Treatment Plan for Primary Diagnosis: Severe recurrent major depression without psychotic features  Long Term Goal(s): Safe transition to appropriate next level of care at discharge, Engage patient in therapeutic group addressing interpersonal concerns.  Short Term Goals: Engage patient in aftercare planning with referrals and resources, Increase ability to appropriately verbalize feelings, Increase emotional regulation, Identify triggers associated with mental health/substance abuse issues and Increase skills for wellness and recovery  Therapeutic Interventions: Assess for all discharge needs, 1 to 1 time with Social worker, Explore available resources and support systems, Assess for adequacy in community support network, Educate family and significant other(s) on suicide prevention, Complete Psychosocial Assessment, Interpersonal group therapy.  Evaluation of Outcomes: Not Met   Progress in Treatment: Attending groups: No. Pt new to unit, continuing to assess Participating in groups: No. Taking medication as prescribed: Yes. Toleration medication: Yes. Family/Significant other contact made: No, will contact:  family member if pt consent to collateral contact Patient understands diagnosis: Yes. Discussing patient identified problems/goals with staff: Yes. Medical problems stabilized or resolved: Yes. Denies suicidal/homicidal ideation: No. Passive SI, able to contract for safety on the unit Issues/concerns per patient self-inventory: No. Other: n/a  New problem(s) identified: Yes, Describe:  pt reports ongoing chronic pain issues, rating pain level at 9/10  New Short Term/Long Term Goal(s): detox, medication management for mood stabilization; elimination of SI thoughts; development of comprehensive mental wellness/sobriety plan.     Patient Goals:  "Change my way of thinking and get into a long-term program"  Discharge Plan or Barriers: CSW assessing for appropriate referrals. Irene pamphlet, Mobile Crisis information, and AA/NA information provided to patient for additional community support and resources.    Reason for Continuation of Hospitalization: Depression Medication stabilization Suicidal ideation Withdrawal symptoms Other; describe chronic pain  Estimated Length of Stay: Thursday 08/31/2018  Attendees: Patient: Scott [name] 08/28/2018 9:35 AM  Physician: Dr. Mallie Darting MD 08/28/2018 9:35 AM  Nursing: Clarise Cruz RN; Estill Bamberg RN 08/28/2018 9:35 AM  RN Care Manager:x 08/28/2018 9:35 AM  Social Worker: Janice Norrie LCSW 08/28/2018 9:35 AM  Recreational Therapist: x 08/28/2018 9:35 AM  Other: Harriett Sine NP 08/28/2018 9:35 AM  Other:  08/28/2018 9:35 AM  Other: 08/28/2018 9:35 AM    Scribe for Treatment Team: Avelina Laine, LCSW 08/28/2018 9:35 AM
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
## 2018-08-29 LAB — HEPATIC FUNCTION PANEL
ALT: 378 U/L — ABNORMAL HIGH (ref 0–44)
AST: 233 U/L — ABNORMAL HIGH (ref 15–41)
Albumin: 3.6 g/dL (ref 3.5–5.0)
Alkaline Phosphatase: 84 U/L (ref 38–126)
Bilirubin, Direct: 0.2 mg/dL (ref 0.0–0.2)
Indirect Bilirubin: 0.7 mg/dL (ref 0.3–0.9)
Total Bilirubin: 0.9 mg/dL (ref 0.3–1.2)
Total Protein: 6.7 g/dL (ref 6.5–8.1)

## 2018-08-29 NOTE — Progress Notes (Signed)
Patient ID: Scott [name], male   DOB: 19-Sep-1970, 48 y.o.   MRN: [id number]  Nursing Progress Note [id number]  Data: On initial approach, patient is seen up in the milieu. Patient presents calm, pleasant and cooperative. Patient compliant with scheduled medications. Patient denies pain/physical complaints. Patient completed self-inventory sheet and rates depression, hopelessness, and anxiety 8,7,9 respectively. Patient rates their sleep and appetite as fair/fair respectively. Patient states goal for today is to "try to get it together". Patient is seen attending groups and visible in the milieu. Patient currently reports passive SI with no plan/intent and denies HI/AVH. Patient with somatic complaints of withdrawal symptoms, detox protocol in place.  Action: Patient is educated about and provided medication per provider's orders. Patient safety maintained with q15 min safety checks and frequent rounding. High fall risk precautions in place. Emotional support given. 1:1 interaction and active listening provided. Patient encouraged to attend meals, groups, and work on treatment plan and goals. Labs, vital signs and patient behavior monitored throughout shift.   Response: Patient remains safe on the unit at this time and agrees to come to staff with any issues/concerns. Patient is interacting with peers appropriately on the unit. Will continue to support and monitor.

## 2018-08-29 NOTE — Progress Notes (Signed)
Pt in room in bed at assessment.  Pt is known to this Clinical research associate and responds the same way as previous admits.  Pt is concentrated on making sure he receives Ativan as soon as possible.  Pt has bandage to writs from cutting attempt while in ed with a plastic knife.  Pt is high fall risk due to seizures from ETOH withdrawal.  No evidence noted of dissociative disorders.  Pt is anxious and talkative and relates [employer] experiences.  When pt does not think he is being observed, he will exhibit calm behavior and is seen laughing and joking with other pts.  Pt has no tremors until he approaches medical staff.  Later, pt does not exhibit tremors and is seen laughing.  Pt reports passive SI but contracts for safety  Pt does not appear to be responding to internal stimuli.   Pt given PRN medications. Pt education provided.  Emotional support given.   Pt remains safe on unit

## 2018-08-29 NOTE — [facility] Group Notes (Signed)
[facility] Group Therapy 08/29/2018 12:59 PM  Type of Therapy: Mental Health Association Presentation  Participation Level: Active  Participation Quality: Attentive  Affect: Appropriate  Cognitive: Oriented  Insight: Developing/Improving  Engagement in Therapy: Engaged  Modes of Intervention: Discussion, Education and Socialization   Summary of Progress/Problems: Mental Health Association (MHA) Speaker came to talk about his personal journey with mental health. The pt processed ways by which to relate to the speaker. MHA speaker provided handouts and educational information pertaining to groups and services offered by the MHA. Pt was engaged in speaker's presentation and was receptive to resources provided.   Shada Nienaber, MSW, LCSWA 08/29/2018 12:59 PM 

## 2018-08-29 NOTE — Progress Notes (Signed)
The patient shared in group that he spent his day trying to "come to terms" with his illness. He does feel that being around his peers has helped him to identify with others. His goal for tomorrow is to work on "positivity".

## 2018-08-29 NOTE — Plan of Care (Signed)
  Problem: Education: Goal: Knowledge of disease or condition will improve Outcome: Progressing Goal: Understanding of discharge needs will improve Outcome: Progressing   Problem: Health Behavior/Discharge Planning: Goal: Ability to identify changes in lifestyle to reduce recurrence of condition will improve Outcome: Progressing   Problem: Physical Regulation: Goal: Complications related to the disease process, condition or treatment will be avoided or minimized Outcome: Progressing   Problem: Education: Goal: Knowledge of [facility] Education information/materials will improve Outcome: Progressing   Problem: Coping: Goal: Ability to demonstrate self-control will improve Outcome: Progressing   Problem: Medication: Goal: Compliance with prescribed medication regimen will improve Outcome: Progressing
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
## 2018-08-30 LAB — HEPATIC FUNCTION PANEL
ALT: 452 U/L — ABNORMAL HIGH (ref 0–44)
AST: 261 U/L — ABNORMAL HIGH (ref 15–41)
Albumin: 3.8 g/dL (ref 3.5–5.0)
Alkaline Phosphatase: 83 U/L (ref 38–126)
Bilirubin, Direct: 0.2 mg/dL (ref 0.0–0.2)
Indirect Bilirubin: 0.5 mg/dL (ref 0.3–0.9)
Total Bilirubin: 0.7 mg/dL (ref 0.3–1.2)
Total Protein: 6.9 g/dL (ref 6.5–8.1)

## 2018-08-30 LAB — PROTIME-INR
INR: 1.01
Prothrombin Time: 13.2 seconds (ref 11.4–15.2)

## 2018-08-30 MED ORDER — CITALOPRAM HYDROBROMIDE 10 MG PO TABS
10.0000 mg | ORAL_TABLET | Freq: Every day | ORAL | Status: DC
  Administered 2018-08-30 – 2018-09-01 (×3): 10 mg via ORAL
  Filled 2018-08-30 (×6): qty 1

## 2018-08-30 MED ORDER — OXCARBAZEPINE 150 MG PO TABS
150.0000 mg | ORAL_TABLET | Freq: Two times a day (BID) | ORAL | Status: DC
  Administered 2018-08-30 – 2018-09-01 (×5): 150 mg via ORAL
  Filled 2018-08-30 (×9): qty 1

## 2018-08-30 NOTE — [facility] Suicide Risk Assessment (Signed)
[facility] INPATIENT:  Family/Significant Other Suicide Prevention Education  Suicide Prevention Education:  Contact Attempts: wife, Dreshaun Shriber [phone]) has been identified by the patient as the family member/significant other with whom the patient will be residing, and identified as the person(s) who will aid the patient in the event of a mental health crisis.  With written consent from the patient, two attempts were made to provide suicide prevention education, prior to and/or following the patient's discharge.  We were unsuccessful in providing suicide prevention education.  A suicide education pamphlet was given to the patient to share with family/significant other.  Date and time of first attempt: 08/30/2018 at 1:03pm  - Phone appears to be a home number, kept ringing, no option to leave a voicemail. Date and time of second attempt: TO BE ATTEMPTED AT A LATER TIME  [name] 08/30/2018, 1:05 PM

## 2018-08-30 NOTE — Progress Notes (Signed)
Nursing Progress Note 19:00-07:00    Pt blunted in affect and depressed in mood. Pt reported withdrawal symptoms of shakiness and indigestion. Pt reported his day today was better than yesterday. Pt pleasant and cooperative. PRN Maalox was given for bloating and indigestion along with Ativan for anxiety. Pt denies SI/HI/AVH and contracts for safety.

## 2018-08-30 NOTE — [facility] Suicide Risk Assessment (Signed)
[facility] INPATIENT:  Family/Significant Other Suicide Prevention Education  Suicide Prevention Education:  Contact Attempts: wife, Scott [name] [phone]) has been identified by the patient as the family member/significant other with whom the patient will be residing, and identified as the person(s) who will aid the patient in the event of a mental health crisis.  With written consent from the patient, two attempts were made to provide suicide prevention education, prior to and/or following the patient's discharge.  We were unsuccessful in providing suicide prevention education.  A suicide education pamphlet was given to the patient to share with family/significant other.  Date and time of first attempt: 08/30/2018 at 1:03pm  - Phone appears to be a home number, kept ringing, no option to leave a voicemail. Date and time of second attempt: 08/30/2018 at 2:50pm - No answer  [name] 08/30/2018, 2:51 PM

## 2018-08-30 NOTE — Progress Notes (Addendum)
[facility]  08/30/2018 10:58 AM Scott [name]  MRN:  [id number]   Subjective: Patient reports today "I feel like she it."  Patient then goes on to state that he feels that he is possibly having some withdrawal symptoms in his body just feels bad.  Patient does not describe any specific issues that are bothering him at this moment.  Patient does report multiple medications in the past that he cannot take anymore, such as Lamictal causing a rash, high doses of Seroquel causing tremors, Cymbalta causing issues with him as well.  Patient did state that Celexa and Trileptal have worked for him in the past to manage his depression and his agitation.  He reports that he is feeling agitated some today.  He denies any suicidal homicidal ideations and denies any hallucinations.  He does report moderate level of depression today and reports feeling anxious.  He request to have Klonopin given to him scheduled but patient is given a detailed explanation of why we would not give him Klonopin on a regular basis and he states understanding.  Objective: Patient's chart and findings reviewed and discussed with treatment team.  Patient presents in the day room and has been seen interacting with peers and staff appropriately.  Patient is pleasant, calm, and cooperative during the interview.  There have been no behavioral complaints about the patient thus far on the unit only that he has been asking him multiple staff members to get him Klonopin prescribed.  Principal Problem: Severe recurrent major depression without psychotic features (HCC) Diagnosis: Principal Problem:   Severe recurrent major depression without psychotic features (HCC)  Total Time spent with patient: 30 minutes  Past Psychiatric History: See H&P  Past Medical History:  Past Medical History:  Diagnosis Date  . Accidental heroin overdose (HCC)   . Anxiety   . Back pain   . Bipolar 1 disorder (HCC)   . Chronic back pain   . Depression    . Drug-seeking behavior   . GERD (gastroesophageal reflux disease)   . Heroin abuse (HCC)   . History of ETOH abuse   . Myocardial infarction [facility](HCC)    Pt says that the doctor determined it was panic attacks  . Opioid dependence (HCC)   . PTSD (post-traumatic stress disorder)   . Seizures (HCC)    alcohol induced- pt reports sober for 6 months    Past Surgical History:  Procedure Laterality Date  . ABDOMINAL SURGERY    . APPENDECTOMY    . BACK SURGERY     Family History:  Family History  Problem Relation Age of Onset  . Hypertension Mother   . CAD Father   . COPD Father   . Stroke Father   . Testicular cancer Brother    Family Psychiatric  History: See H&P Social History:  Social History   Substance and Sexual Activity  Alcohol Use Not Currently   Comment: Pt reports sober for 6 months     Social History   Substance and Sexual Activity  Drug Use Yes  . Types: Methamphetamines, Marijuana, IV, Cocaine   Comment: Pt says not sure why positive for amphetamines    Social History   Socioeconomic History  . Marital status: Married    Spouse name: Not on file  . Number of children: Not on file  . Years of education: Not on file  . Highest education level: Not on file  Occupational History  . Not on file  Social Needs  .  Financial resource strain: Not on file  . Food insecurity:    Worry: Not on file    Inability: Not on file  . Transportation needs:    Medical: Not on file    Non-medical: Not on file  Tobacco Use  . Smoking status: Current Every Day Smoker    Packs/day: 1.50    Types: Cigarettes  . Smokeless tobacco: Never Used  Substance and Sexual Activity  . Alcohol use: Not Currently    Comment: Pt reports sober for 6 months  . Drug use: Yes    Types: Methamphetamines, Marijuana, IV, Cocaine    Comment: Pt says not sure why positive for amphetamines  . Sexual activity: Yes    Birth control/protection: None    Comment: heroin  Lifestyle  .  Physical activity:    Days per week: Not on file    Minutes per session: Not on file  . Stress: Not on file  Relationships  . Social connections:    Talks on phone: Not on file    Gets together: Not on file    Attends religious service: Not on file    Active member of club or organization: Not on file    Attends meetings of clubs or organizations: Not on file    Relationship status: Not on file  Other Topics Concern  . Not on file  Social History Narrative  . Not on file   Additional Social History:                         Sleep: Good  Appetite:  Good  Current Medications: Current Facility-Administered Medications  Medication Dose Route Frequency Provider Last Rate Last Dose  . alum & mag hydroxide-simeth (MAALOX/MYLANTA) 200-200-20 MG/5ML suspension 30 mL  30 mL Oral Q4H PRN Nira Conn A, NP   30 mL at 08/29/18 1550  . busPIRone (BUSPAR) tablet 10 mg  10 mg Oral TID Antonieta Pert, MD   10 mg at 08/30/18 0815  . citalopram (CELEXA) tablet 10 mg  10 mg Oral Daily Money, Travis B, FNP      . gabapentin (NEURONTIN) capsule 300 mg  300 mg Oral TID Antonieta Pert, MD   300 mg at 08/30/18 0815  . hydrOXYzine (ATARAX/VISTARIL) tablet 25 mg  25 mg Oral Q6H PRN Nira Conn A, NP      . loperamide (IMODIUM) capsule 2-4 mg  2-4 mg Oral PRN Nira Conn A, NP      . LORazepam (ATIVAN) tablet 1 mg  1 mg Oral Q6H PRN Nira Conn A, NP   1 mg at 08/30/18 0815  . magnesium hydroxide (MILK OF MAGNESIA) suspension 30 mL  30 mL Oral Daily PRN Nira Conn A, NP      . nicotine (NICODERM CQ - dosed in mg/24 hours) patch 21 mg  21 mg Transdermal Daily Antonieta Pert, MD   21 mg at 08/30/18 0815  . ondansetron (ZOFRAN-ODT) disintegrating tablet 4 mg  4 mg Oral Q6H PRN Nira Conn A, NP   4 mg at 08/30/18 0018  . OXcarbazepine (TRILEPTAL) tablet 150 mg  150 mg Oral BID Money, Gerlene Burdock, FNP      . QUEtiapine (SEROQUEL) tablet 50 mg  50 mg Oral QHS Denisha Hoel, Rockey Situ, MD   50  mg at 08/30/18 0018    Lab Results:  Results for orders placed or performed during the hospital encounter of 08/28/18 (from the past 48 hour(s))  Hepatic function panel     Status: Abnormal   Collection Time: 08/29/18  6:25 AM  Result Value Ref Range   Total Protein 6.7 6.5 - 8.1 g/dL   Albumin 3.6 3.5 - 5.0 g/dL   AST 048 (H) 15 - 41 U/L   ALT 378 (H) 0 - 44 U/L   Alkaline Phosphatase 84 38 - 126 U/L   Total Bilirubin 0.9 0.3 - 1.2 mg/dL   Bilirubin, Direct 0.2 0.0 - 0.2 mg/dL   Indirect Bilirubin 0.7 0.3 - 0.9 mg/dL    Comment: Performed at [facility], [address], [place], [place] [address]  Protime-INR     Status: None   Collection Time: 08/30/18  6:10 AM  Result Value Ref Range   Prothrombin Time 13.2 11.4 - 15.2 seconds   INR 1.01     Comment: Performed at [facility], [address] [place], [place] [address]  Hepatic function panel     Status: Abnormal   Collection Time: 08/30/18  6:10 AM  Result Value Ref Range   Total Protein 6.9 6.5 - 8.1 g/dL   Albumin 3.8 3.5 - 5.0 g/dL   AST 888 (H) 15 - 41 U/L   ALT 452 (H) 0 - 44 U/L   Alkaline Phosphatase 83 38 - 126 U/L   Total Bilirubin 0.7 0.3 - 1.2 mg/dL   Bilirubin, Direct 0.2 0.0 - 0.2 mg/dL   Indirect Bilirubin 0.5 0.3 - 0.9 mg/dL    Comment: Performed at [facility], [address] [place], [place] [address]    Blood Alcohol level:  Lab Results  Component Value Date   ETH <10 08/27/2018   ETH <10 05/06/2018    Metabolic Disorder Labs: No results found for: HGBA1C, MPG No results found for: PROLACTIN No results found for: CHOL, TRIG, HDL, CHOLHDL, VLDL, LDLCALC  Physical Findings: AIMS: Facial and Oral Movements Muscles of Facial Expression: None, normal Lips and Perioral Area: None, normal Jaw: None, normal Tongue: None, normal,Extremity Movements Upper (arms, wrists, hands, fingers): None, normal Lower (legs, knees, ankles, toes): None,  normal, Trunk Movements Neck, shoulders, hips: None, normal, Overall Severity Severity of abnormal movements (highest score from questions above): None, normal Incapacitation due to abnormal movements: None, normal Patient's awareness of abnormal movements (rate only patient's report): No Awareness, Dental Status Current problems with teeth and/or dentures?: No Does patient usually wear dentures?: No  CIWA:  CIWA-Ar Total: 8 COWS:     Musculoskeletal: Strength & Muscle Tone: within normal limits Gait & Station: normal Patient leans: N/A  Psychiatric Specialty Exam: Physical Exam  Nursing note and vitals reviewed. Constitutional: He is oriented to person, place, and time. He appears well-developed and well-nourished.  Cardiovascular: Normal rate.  Respiratory: Effort normal.  Musculoskeletal: Normal range of motion.  Neurological: He is alert and oriented to person, place, and time.  Skin: Skin is warm.    Review of Systems  Constitutional: Negative.   HENT: Negative.   Eyes: Negative.   Respiratory: Negative.   Cardiovascular: Negative.   Gastrointestinal: Negative.   Genitourinary: Negative.   Musculoskeletal: Negative.   Skin: Negative.   Neurological: Negative.   Endo/Heme/Allergies: Negative.   Psychiatric/Behavioral: Positive for depression. The patient is nervous/anxious.     Blood pressure 106/76, pulse 83, temperature 97.9 F (36.6 C), temperature source Oral, resp. rate 18, height 5\' 10"  (1.778 m), weight 129.3 kg.Body mass index is 40.89 kg/m.  General Appearance: Casual  Eye Contact:  Good  Speech:  Clear and Coherent and Normal Rate  Volume:  Normal  Mood:  Anxious  Affect:  Congruent  Thought Process:  Coherent and Descriptions of Associations: Intact  Orientation:  Full (Time, Place, and Person)  Thought Content:  WDL  Suicidal Thoughts:  No  Homicidal Thoughts:  No  Memory:  Immediate;   Good Recent;   Good Remote;   Good  Judgement:  Fair   Insight:  Fair  Psychomotor Activity:  Normal  Concentration:  Concentration: Good and Attention Span: Good  Recall:  Good  Fund of Knowledge:  Good  Language:  Good  Akathisia:  No  Handed:  Right  AIMS (if indicated):     Assets:  Communication Skills Desire for Improvement Housing Physical Health Social Support Transportation  ADL's:  Intact  Cognition:  WNL  Sleep:  Number of Hours: 6   Problems addressed MDD severe recurrent  Treatment Plan Summary: Daily contact with patient to assess and evaluate symptoms and progress in treatment, Medication management and Plan is to: Continue Seroquel 50 mg p.o. nightly for mood stability and sleep Start Trileptal 150 mg p.o. twice daily for mood stability Continue CIWA protocol Continue Neurontin 300 mg p.o. 3 times daily for mood stability and withdrawal symptoms Start Celexa 10 mg p.o. daily for mood stability Continue BuSpar 10 mg p.o. 3 times daily for anxiety Encourage group therapy participation  Maryfrances Bunnellravis B Money, FNP 08/30/2018, 10:58 AM    ..Agree with NP Progress Note

## 2018-08-30 NOTE — Progress Notes (Signed)
Patient ID: Scott [name], male   DOB: 04-24-1971, 48 y.o.   MRN: [id number]  Nursing Progress Note [id number]  Data: On initial approach, patient is resting in bed. Patient presents flat this morning with complaints of withdrawal symptoms and poor sleep. Patient compliant with scheduled medications. Patient completed self-inventory sheet and rates depression, hopelessness, and anxiety 5,4,8 respectively. Patient rates their sleep and appetite as fair/fair respectively. Patient states goal for today is to "be positive". Patient is seen attending groups and visible in the milieu. Patient currently denies SI/HI/AVH.   Action: Patient is educated about and provided medication per provider's orders. Patient safety maintained with q15 min safety checks and frequent rounding. High fall risk precautions in place. Emotional support given. 1:1 interaction and active listening provided. Patient encouraged to attend meals, groups, and work on treatment plan and goals. Labs, vital signs and patient behavior monitored throughout shift.   Response: Patient remains safe on the unit at this time and agrees to come to staff with any issues/concerns. Patient is interacting with peers appropriately on the unit. Will continue to support and monitor.

## 2018-08-30 NOTE — Progress Notes (Signed)
Joyice Faster from [facility] contacted hospital to verify that pt is inpatient, as he has a court date today, 1/22 in [place]. Cion gave CSW verbal permission to fax hospital note to [place] at [phone] with hospital admission date. Pt provided with fax and confirmation sheet for his records.  Abir Eroh S. Alan Ripper, MSW, LCSW Clinical Social Worker 08/30/2018 10:38 AM

## 2018-08-30 NOTE — Progress Notes (Signed)
Recreation Therapy Notes  Date:  1.22.20 Time: 0930 Location: [address] Hall Dayroom  Group Topic: Stress Management  Goal Area(s) Addresses:  Patient will identify positive stress management techniques. Patient will identify benefits of using stress management post d/c.  Behavioral Response:  Engaged  Intervention:  Stress Management  Activity :  Meditation.  LRT introduced the stress management technique of meditation.  LRT played a meditation that dealt positive possibilities which walked patients through looking at each day as a new beginning.  Patients were to follow along as the meditation played to engage in the activity.  Education:  Stress Management, Discharge Planning.   Education Outcome: Acknowledges Education  Clinical Observations/Feedback: Pt attended and participated in group.    Caroll Rancher, LRT/CTRS         Caroll Rancher A 08/30/2018 11:36 AM

## 2018-08-30 NOTE — Progress Notes (Signed)
D    Pt is irritable and complains of moderate withdrawal symptoms   He has tremors and stomach upset    Pt said he stayed in his room because he doesn't want to be around people and forgot to come get his medications on time A   Verbal support given    Medications administered and effectiveness monitored    Q 15 min checks   Discussed withdawal symptoms and medications to help relieve the symptoms R   Pt remains safe at this time    Verbalized understanding

## 2018-08-31 MED ORDER — GABAPENTIN 600 MG PO TABS
600.0000 mg | ORAL_TABLET | Freq: Three times a day (TID) | ORAL | Status: DC
  Administered 2018-08-31 – 2018-09-01 (×2): 600 mg via ORAL
  Filled 2018-08-31 (×8): qty 1

## 2018-08-31 MED ORDER — GABAPENTIN 300 MG PO CAPS
300.0000 mg | ORAL_CAPSULE | Freq: Three times a day (TID) | ORAL | Status: DC
  Administered 2018-08-31: 300 mg via ORAL
  Filled 2018-08-31 (×6): qty 1

## 2018-08-31 NOTE — Progress Notes (Signed)
7a-7p Shift:  D:  Pt is pleasant, bright, and cooperative although he continues to complain of 9/10 generalized pain.  He is observed interacting with his peers within the milieu.  He does not show evidence of being vested in treatment at this time.     A:  Support, education, and encouragement provided as appropriate to situation.  Medications administered per MD order.  Level 3 checks continued for safety.   R:  Pt receptive to measures; Safety maintained.

## 2018-08-31 NOTE — Progress Notes (Signed)
CSW met with patient to discuss discharge plans, anticipating a possible discharge for 09/01/2018. Patient agreeable to [facility] referral and is aware that his [facility] referral has been received but he has not been accepted or declined at this time. CSW to provide patient with information to follow up with his [facility] referral.  Patient shares "I'm not sure [facility] would have been a great plan anyway, with my court dates. I need to take care of that." Patient demonstrates improving insight, pleasant, voiced appreciation to CSW. Agreeable to discharging soon and agreeable to following up with PHP referrals.  Stephanie Acre, LCSW-A Clinical Social Worker

## 2018-08-31 NOTE — [facility] Group Notes (Signed)
LCSW Group Therapy Note  08/31/2018 2:42 PM  Type of Therapy/Topic:  Group Therapy:  Balance in Life  Participation Level:  Active  Description of Group:     This group will address the concept of balance and how it feels and looks when one is unbalanced. Patients will be encouraged to process areas in their lives that are out of balance and identify reasons for remaining unbalanced. Facilitators will guide patients in utilizing problem-solving interventions to address and correct the stressor making their life unbalanced. Understanding and applying boundaries will be explored and addressed for obtaining and maintaining a balanced life. Patients will be encouraged to explore ways to assertively make their unbalanced needs known to significant others in their lives, using other group members and facilitator for support and feedback.  Therapeutic Goals:  1.     Patient will identify two or more emotions or situations they have that consume much of in their lives. 2.     Patient will identify signs/triggers that life has become out of balance: 3.     Patient will identify two ways to set boundaries in order to achieve balance in their lives: 4.     Patient will demonstrate ability to communicate their needs through discussion and/or role plays  Summary of Patient Progress: Loron attended the entire session. He was engaged with the topic and described a few areas in his life that were out of balance. He shared that he would take a step in the right direction by going to his doctor appointments.   Therapeutic Modalities:   Cognitive Behavioral Therapy Solution-Focused Therapy Assertiveness Training  Marian Sorrow MSW Intern Phone: [phone] Fax: [phone]

## 2018-08-31 NOTE — Progress Notes (Signed)
[facility] MD Progress Note  08/31/2018 10:23 AM Scott [name]  MRN:  [id number] Subjective:    Patient is well-known to the service he has been at multiple other hospitals for detox so forth is known to have a severe personality disorder.  He shows me the superficial scratches on his left wrist that he made with a fork in the emergency department and when asked him why he states "I will talk about it later" again is known to have manipulation and chronic personality disorder complicating his substance abuse. States he still has withdrawal but his vitals are stable and he has no tremors his pulse rate is 86. He has multiple court dates and felony charges he is expected to be discharged tomorrow his detox is largely complete he has no thoughts of harming self or others at the present time  Principal Problem: Severe recurrent major depression without psychotic features (HCC) Diagnosis: Principal Problem:   Severe recurrent major depression without psychotic features (HCC)  Total Time spent with patient: 20 minutes  Past Medical History:  Past Medical History:  Diagnosis Date  . Accidental heroin overdose (HCC)   . Anxiety   . Back pain   . Bipolar 1 disorder (HCC)   . Chronic back pain   . Depression   . Drug-seeking behavior   . GERD (gastroesophageal reflux disease)   . Heroin abuse (HCC)   . History of ETOH abuse   . Myocardial infarction [facility]HCC)    Pt says that the doctor determined it was panic attacks  . Opioid dependence (HCC)   . PTSD (post-traumatic stress disorder)   . Seizures (HCC)    alcohol induced- pt reports sober for 6 months    Past Surgical History:  Procedure Laterality Date  . ABDOMINAL SURGERY    . APPENDECTOMY    . BACK SURGERY     Family History:  Family History  Problem Relation Age of Onset  . Hypertension Mother   . CAD Father   . COPD Father   . Stroke Father   . Testicular cancer Brother    Family Psychiatric  History: ext Social History:  Social  History   Substance and Sexual Activity  Alcohol Use Not Currently   Comment: Pt reports sober for 6 months     Social History   Substance and Sexual Activity  Drug Use Yes  . Types: Methamphetamines, Marijuana, IV, Cocaine   Comment: Pt says not sure why positive for amphetamines    Social History   Socioeconomic History  . Marital status: Married    Spouse name: Not on file  . Number of children: Not on file  . Years of education: Not on file  . Highest education level: Not on file  Occupational History  . Not on file  Social Needs  . Financial resource strain: Not on file  . Food insecurity:    Worry: Not on file    Inability: Not on file  . Transportation needs:    Medical: Not on file    Non-medical: Not on file  Tobacco Use  . Smoking status: Current Every Day Smoker    Packs/day: 1.50    Types: Cigarettes  . Smokeless tobacco: Never Used  Substance and Sexual Activity  . Alcohol use: Not Currently    Comment: Pt reports sober for 6 months  . Drug use: Yes    Types: Methamphetamines, Marijuana, IV, Cocaine    Comment: Pt says not sure why positive for amphetamines  .  Sexual activity: Yes    Birth control/protection: None    Comment: heroin  Lifestyle  . Physical activity:    Days per week: Not on file    Minutes per session: Not on file  . Stress: Not on file  Relationships  . Social connections:    Talks on phone: Not on file    Gets together: Not on file    Attends religious service: Not on file    Active member of club or organization: Not on file    Attends meetings of clubs or organizations: Not on file    Relationship status: Not on file  Other Topics Concern  . Not on file  Social History Narrative  . Not on file   Additional Social History:                         Sleep: Good  Appetite:  Good  Current Medications: Current Facility-Administered Medications  Medication Dose Route Frequency Provider Last Rate Last Dose  .  alum & mag hydroxide-simeth (MAALOX/MYLANTA) 200-200-20 MG/5ML suspension 30 mL  30 mL Oral Q4H PRN Nira Conn A, NP   30 mL at 08/30/18 2106  . busPIRone (BUSPAR) tablet 10 mg  10 mg Oral TID Antonieta Pert, MD   10 mg at 08/31/18 0750  . citalopram (CELEXA) tablet 10 mg  10 mg Oral Daily Money, Gerlene Burdock, FNP   10 mg at 08/31/18 0751  . gabapentin (NEURONTIN) capsule 300 mg  300 mg Oral TID Antonieta Pert, MD   300 mg at 08/31/18 0751  . gabapentin (NEURONTIN) capsule 300 mg  300 mg Oral TID Malvin Johns, MD      . magnesium hydroxide (MILK OF MAGNESIA) suspension 30 mL  30 mL Oral Daily PRN Nira Conn A, NP      . nicotine (NICODERM CQ - dosed in mg/24 hours) patch 21 mg  21 mg Transdermal Daily Antonieta Pert, MD   21 mg at 08/31/18 0751  . OXcarbazepine (TRILEPTAL) tablet 150 mg  150 mg Oral BID Money, Gerlene Burdock, FNP   150 mg at 08/31/18 0751  . QUEtiapine (SEROQUEL) tablet 50 mg  50 mg Oral QHS Cobos, Rockey Situ, MD   50 mg at 08/30/18 2144    Lab Results:  Results for orders placed or performed during the hospital encounter of 08/28/18 (from the past 48 hour(s))  Protime-INR     Status: None   Collection Time: 08/30/18  6:10 AM  Result Value Ref Range   Prothrombin Time 13.2 11.4 - 15.2 seconds   INR 1.01     Comment: Performed at [facility], [address] [place], [place] [address]  Hepatic function panel     Status: Abnormal   Collection Time: 08/30/18  6:10 AM  Result Value Ref Range   Total Protein 6.9 6.5 - 8.1 g/dL   Albumin 3.8 3.5 - 5.0 g/dL   AST 035 (H) 15 - 41 U/L   ALT 452 (H) 0 - 44 U/L   Alkaline Phosphatase 83 38 - 126 U/L   Total Bilirubin 0.7 0.3 - 1.2 mg/dL   Bilirubin, Direct 0.2 0.0 - 0.2 mg/dL   Indirect Bilirubin 0.5 0.3 - 0.9 mg/dL    Comment: Performed at [facility], [address], [place], [place] [address]    Blood Alcohol level:  Lab Results  Component Value Date   ETH <10 08/27/2018  ETH <10 05/06/2018    Metabolic Disorder Labs: No results found for: HGBA1C, MPG No results found for: PROLACTIN No results found for: CHOL, TRIG, HDL, CHOLHDL, VLDL, LDLCALC  Physical Findings: AIMS: Facial and Oral Movements Muscles of Facial Expression: None, normal Lips and Perioral Area: None, normal Jaw: None, normal Tongue: None, normal,Extremity Movements Upper (arms, wrists, hands, fingers): None, normal Lower (legs, knees, ankles, toes): None, normal, Trunk Movements Neck, shoulders, hips: None, normal, Overall Severity Severity of abnormal movements (highest score from questions above): None, normal Incapacitation due to abnormal movements: None, normal Patient's awareness of abnormal movements (rate only patient's report): No Awareness, Dental Status Current problems with teeth and/or dentures?: No Does patient usually wear dentures?: No  CIWA:  CIWA-Ar Total: 2 COWS:     Musculoskeletal: Strength & Muscle Tone: within normal limits Gait & Station: normal Patient leans: N/A  Psychiatric Specialty Exam: Physical Exam  ROS  Blood pressure 123/73, pulse 86, temperature 98 F (36.7 C), temperature source Oral, resp. rate 20, height 5\' 10"  (1.778 m), weight 129.3 kg.Body mass index is 40.89 kg/m.  General Appearance: Casual  Eye Contact:  Fair  Speech:  Clear and Coherent  Volume:  Normal  Mood:  Euthymic  Affect:  Congruent  Thought Process:  Goal Directed  Orientation:  Full (Time, Place, and Person)  Thought Content:  Tangential  Suicidal Thoughts:  No  Homicidal Thoughts:  No  Memory:  Immediate;   Good  Judgement:  Fair  Insight:  Fair  Psychomotor Activity:  Normal  Concentration:  Concentration: Fair  Recall:  Fair  Fund of Knowledge:  Fair  Language:  Fair  Akathisia:  Negative  Handed:  Right  AIMS (if indicated):     Assets:  Physical Health Resilience  ADL's:  Intact  Cognition:  WNL  Sleep:  Number of Hours: 6.75     Treatment Plan  Summary: Daily contact with patient to assess and evaluate symptoms and progress in treatment, Medication management and Plan No change in medications, continue current precautions probable discharge tomorrow continue reality and cognitive based therapies  Malvin Johns, MD 08/31/2018, 10:23 AM

## 2018-08-31 NOTE — [facility] Suicide Risk Assessment (Signed)
[facility] INPATIENT:  Family/Significant Other Suicide Prevention Education  Suicide Prevention Education:  Education Completed; wife, Scott [name] [phone])  has been identified by the patient as the family member/significant other with whom the patient will be residing, and identified as the person(s) who will aid the patient in the event of a mental health crisis (suicidal ideations/suicide attempt).  With written consent from the patient, the family member/significant other has been provided the following suicide prevention education, prior to the and/or following the discharge of the patient.  The suicide prevention education provided includes the following:  Suicide risk factors  Suicide prevention and interventions  National Suicide Hotline telephone number  [facility] assessment telephone number  [facility] Emergency Assistance 911  [facility] and/or Residential Mobile Crisis Unit telephone number  Request made of family/significant other to:  Remove weapons (e.g., guns, rifles, knives), all items previously/currently identified as safety concern.    Remove drugs/medications (over-the-counter, prescriptions, illicit drugs), all items previously/currently identified as a safety concern.  The family member/significant other verbalizes understanding of the suicide prevention education information provided. The family member/significant other agrees to remove the items of safety concern listed above.  Wife reports she has spoken with patient several times each day since his [facility] admission. Wife is aware from patient that he may discharge soon, wife states patient "still sounds depressed and down on himself."  Wife voices safety concerns for patient's discharge, as patient has prior suicide attempts and a history of self injurious behaviors. Wife reports there are no guns in the home, but there are kitchen knives and medications. CSW suggested wife keep extra  medications with her our in her car when she goes to work, wife said she would consider it.  Wife reports patient does well for a couple of days after leaving the hospital but then falls into depressive spells where he often engages in self-destructive behaviors. Wife voiced understanding of SPE information, no additional questions for CSW or concerns at this time.    Scott [name] 08/31/2018, 12:44 PM

## 2018-09-01 MED ORDER — GABAPENTIN 600 MG PO TABS: 600.0000 mg | ORAL_TABLET | Freq: Three times a day (TID) | ORAL | 1 refills | Status: AC

## 2018-09-01 MED ORDER — BUSPIRONE HCL 10 MG PO TABS: 10.0000 mg | ORAL_TABLET | Freq: Three times a day (TID) | ORAL | 1 refills | Status: DC

## 2018-09-01 MED ORDER — CITALOPRAM HYDROBROMIDE 20 MG PO TABS: ORAL_TABLET | ORAL | 1 refills | Status: DC

## 2018-09-01 MED ORDER — OXCARBAZEPINE 150 MG PO TABS: 150.0000 mg | ORAL_TABLET | Freq: Two times a day (BID) | ORAL | 1 refills | Status: DC

## 2018-09-01 NOTE — Discharge Summary (Signed)
Physician Discharge Summary Note  Patient:  Scott [name] is an 48 y.o., male MRN:  [id number] DOB:  19-Sep-1970 Patient phone:  [phone] (home)  Patient address:   [name] [place] [id number],  Total Time spent with patient: 30 minutes  Date of Admission:  08/28/2018 Date of Discharge: 09/01/18  Reason for Admission:    Scott [name] is well-known to the psychiatry services at this institution and others he is 48 years of age has a history of recurrent admissions for detox, he presented emergency department on 1/20 complaining of suicidal thoughts even made superficial scratches on his left wrist with a blastic fork in the emergency department/reported relapse on alcohol cocaine and so forth his legal issues were discussed   Principal Problem: Severe recurrent major depression without psychotic features [facility](HCC) Discharge Diagnoses: Principal Problem:   Severe recurrent major depression without psychotic features [facility](HCC)  Past Medical History:  Past Medical History:  Diagnosis Date  . Accidental heroin overdose (HCC)   . Anxiety   . Back pain   . Bipolar 1 disorder (HCC)   . Chronic back pain   . Depression   . Drug-seeking behavior   . GERD (gastroesophageal reflux disease)   . Heroin abuse (HCC)   . History of ETOH abuse   . Myocardial infarction [facility](HCC)    Pt says that the doctor determined it was panic attacks  . Opioid dependence (HCC)   . PTSD (post-traumatic stress disorder)   . Seizures (HCC)    alcohol induced- pt reports sober for 6 months    Past Surgical History:  Procedure Laterality Date  . ABDOMINAL SURGERY    . APPENDECTOMY    . BACK SURGERY     Family History:  Family History  Problem Relation Age of Onset  . Hypertension Mother   . CAD Father   . COPD Father   . Stroke Father   . Testicular cancer Brother     Social History:  Social History   Substance and Sexual Activity  Alcohol Use Not Currently   Comment: Pt reports sober for 6  months     Social History   Substance and Sexual Activity  Drug Use Yes  . Types: Methamphetamines, Marijuana, IV, Cocaine   Comment: Pt says not sure why positive for amphetamines    Social History   Socioeconomic History  . Marital status: Married    Spouse name: Not on file  . Number of children: Not on file  . Years of education: Not on file  . Highest education level: Not on file  Occupational History  . Not on file  Social Needs  . Financial resource strain: Not on file  . Food insecurity:    Worry: Not on file    Inability: Not on file  . Transportation needs:    Medical: Not on file    Non-medical: Not on file  Tobacco Use  . Smoking status: Current Every Day Smoker    Packs/day: 1.50    Types: Cigarettes  . Smokeless tobacco: Never Used  Substance and Sexual Activity  . Alcohol use: Not Currently    Comment: Pt reports sober for 6 months  . Drug use: Yes    Types: Methamphetamines, Marijuana, IV, Cocaine    Comment: Pt says not sure why positive for amphetamines  . Sexual activity: Yes    Birth control/protection: None    Comment: heroin  Lifestyle  . Physical activity:    Days  per week: Not on file    Minutes per session: Not on file  . Stress: Not on file  Relationships  . Social connections:    Talks on phone: Not on file    Gets together: Not on file    Attends religious service: Not on file    Active member of club or organization: Not on file    Attends meetings of clubs or organizations: Not on file    Relationship status: Not on file  Other Topics Concern  . Not on file  Social History Narrative  . Not on file    Hospital Course:    Once here medications were adjusted he was monitored for withdrawal and dangerousness he displayed no dangerousness and no withdrawal symptoms of note, his blood pressure remained normal.  BuSpar was escalated Celexa was given instead of Zoloft and Neurontin was added at his request as was Trileptal. He  really had no depressive symptoms here in fact was jovial and conversant in rounds by the date of the 24th he was alert and oriented cooperative no thoughts of harming self or others contracting fully no cravings tremors or withdrawal and eager to get going.  He stated his legal issues were not a concern that his lawyer taking care of everything although there are numerous warrants apparently for him, but he was insistent upon discharge was going to rehab today  Physical Findings: AIMS: Facial and Oral Movements Muscles of Facial Expression: None, normal Lips and Perioral Area: None, normal Jaw: None, normal Tongue: None, normal,Extremity Movements Upper (arms, wrists, hands, fingers): None, normal Lower (legs, knees, ankles, toes): None, normal, Trunk Movements Neck, shoulders, hips: None, normal, Overall Severity Severity of abnormal movements (highest score from questions above): None, normal Incapacitation due to abnormal movements: None, normal Patient's awareness of abnormal movements (rate only patient's report): No Awareness, Dental Status Current problems with teeth and/or dentures?: No Does patient usually wear dentures?: No  CIWA:  CIWA-Ar Total: 0 COWS:     Musculoskeletal: Strength & Muscle Tone: within normal limits Gait & Station: normal Patient leans: N/A  Psychiatric Specialty Exam: Physical Exam  ROS  Blood pressure 120/76, pulse 79, temperature 98 F (36.7 C), temperature source Oral, resp. rate 20, height 5\' 10"  (1.778 m), weight 129.3 kg.Body mass index is 40.89 kg/m.  General Appearance: Disheveled  Eye Contact:  Good  Speech:  Clear and Coherent  Volume:  Normal  Mood:  Euthymic  Affect:  Congruent  Thought Process:  Coherent  Orientation:  Full (Time, Place, and Person)  Thought Content:  Tangential  Suicidal Thoughts:  No  Homicidal Thoughts:  No  Memory:  NA Immediate;   Good  Judgement:  Intact  Insight:  Fair  Psychomotor Activity:  nl   Concentration:  Concentration: Good  Recall:  Good  Fund of Knowledge:  Good  Language:  Good  Akathisia:  Negative  Handed:  Right  AIMS (if indicated):     Assets:  Physical Health  ADL's:  Intact  Cognition:  WNL  Sleep:  Number of Hours: 6.75     Have you used any form of tobacco in the last 30 days? (Cigarettes, Smokeless Tobacco, Cigars, and/or Pipes): Yes  Has this patient used any form of tobacco in the last 30 days? (Cigarettes, Smokeless Tobacco, Cigars, and/or Pipes) Yes, No  Blood Alcohol level:  Lab Results  Component Value Date   ETH <10 08/27/2018   ETH <10 05/06/2018    Metabolic Disorder  Labs:  No results found for: HGBA1C, MPG No results found for: PROLACTIN No results found for: CHOL, TRIG, HDL, CHOLHDL, VLDL, LDLCALC  See Psychiatric Specialty Exam and Suicide Risk Assessment completed by Attending Physician prior to discharge.  Discharge destination:  Home  Is patient on multiple antipsychotic therapies at discharge:  No   Has Patient had three or more failed trials of antipsychotic monotherapy by history:  No  Recommended Plan for Multiple Antipsychotic Therapies: NA   Allergies as of 09/01/2018      Reactions   Benadryl [diphenhydramine Hcl] Anaphylaxis, Other (See Comments)   Muscles lock up.   Celecoxib Diarrhea, Rash, Other (See Comments)   Nose bleeds, bloating, migraines   Diphenhydramine Anaphylaxis   Hard time breathing, convulsions,   Prednisone Other (See Comments)   Severe Anger   Risperidone And Related Other (See Comments)   Overly sedates   Nsaids Diarrhea, Rash, Other (See Comments)   Nose bleeds, bloating, migraines   Other Other (See Comments)   STEROIDS--caused anger,irritation   Rofecoxib Diarrhea, Rash, Other (See Comments)   Nose bleeds, bloating, migraines   Tolmetin Diarrhea, Rash, Other (See Comments)   Nose bleeds, bloating, migraines   Trazodone And Nefazodone Other (See Comments)   headaches   Ibuprofen  Rash   Latex Itching, Rash, Other (See Comments)   Skin cracks   Paliperidone Palmitate Er Other (See Comments)   Overly sedates      Medication List    STOP taking these medications   carbamazepine 200 MG tablet Commonly known as:  TEGRETOL   gabapentin 100 MG capsule Commonly known as:  NEURONTIN Replaced by:  gabapentin 600 MG tablet   sertraline 50 MG tablet Commonly known as:  ZOLOFT     TAKE these medications     Indication  busPIRone 10 MG tablet Commonly known as:  BUSPAR Take 1 tablet (10 mg total) by mouth 3 (three) times daily. What changed:    medication strength  how much to take  when to take this  additional instructions  Indication:  Major Depressive Disorder   citalopram 20 MG tablet Commonly known as:  CELEXA 1 a day  Indication:  Generalized Anxiety Disorder   gabapentin 600 MG tablet Commonly known as:  NEURONTIN Take 1 tablet (600 mg total) by mouth 3 (three) times daily. Replaces:  gabapentin 100 MG capsule  Indication:  Fibromyalgia Syndrome   OXcarbazepine 150 MG tablet Commonly known as:  TRILEPTAL Take 1 tablet (150 mg total) by mouth 2 (two) times daily.  Indication:  Alcohol Withdrawal Syndrome, Focal Epilepsy      Follow-up Information    [facility] Follow [facility].   Why:  Referral was made on 1/21. Please follow up to inqure about possible bed availability.  Contact information: [address] [place] [address] [phone]        [facility]. Go on 09/04/2018.   Why:  Your intake appointment for the Partial Hospitalization Program is Monday, 1/27 at 8:30a. Please bring your photo ID, proof of insurance, SSN, current medications and any discharge paperwork from this hospitalization.  Contact information: [address] [place] [facility] [place] [address] p: [phone] f: [phone]         SignedMalvin Johns, MD 09/01/2018, 8:16 AM

## 2018-09-01 NOTE — Tx Team (Signed)
Interdisciplinary Treatment and Diagnostic Plan Update  09/01/2018 Time of Session: 0830AM ESTIBEN Scott [name] MRN: [id number]  Principal Diagnosis: Severe recurrent major depression without psychotic features Secondary Diagnoses: Principal Problem:   Severe recurrent major depression without psychotic features (HCC)   Current Medications:  Current Facility-Administered Medications  Medication Dose Route Frequency Provider Last Rate Last Dose  . alum & mag hydroxide-simeth (MAALOX/MYLANTA) 200-200-20 MG/5ML suspension 30 mL  30 mL Oral Q4H PRN Nira Conn A, NP   30 mL at 08/31/18 1409  . busPIRone (BUSPAR) tablet 10 mg  10 mg Oral TID Antonieta Pert, MD   10 mg at 09/01/18 0756  . citalopram (CELEXA) tablet 10 mg  10 mg Oral Daily Money, Gerlene Burdock, FNP   10 mg at 09/01/18 0756  . gabapentin (NEURONTIN) tablet 600 mg  600 mg Oral TID Malvin Johns, MD   600 mg at 09/01/18 0755  . magnesium hydroxide (MILK OF MAGNESIA) suspension 30 mL  30 mL Oral Daily PRN Nira Conn A, NP      . nicotine (NICODERM CQ - dosed in mg/24 hours) patch 21 mg  21 mg Transdermal Daily Antonieta Pert, MD   21 mg at 08/31/18 0751  . OXcarbazepine (TRILEPTAL) tablet 150 mg  150 mg Oral BID Money, Gerlene Burdock, FNP   150 mg at 09/01/18 0755  . QUEtiapine (SEROQUEL) tablet 50 mg  50 mg Oral QHS Cobos, Rockey Situ, MD   50 mg at 08/31/18 2059   PTA Medications: Medications Prior to Admission  Medication Sig Dispense Refill Last Dose  . busPIRone (BUSPAR) 5 MG tablet Take 1 tablet (5 mg total) by mouth 2 (two) times daily. For mood control 60 tablet 1   . carbamazepine (TEGRETOL) 200 MG tablet Take 1 tablet (200 mg total) by mouth 2 (two) times daily. For mood control 60 tablet 1   . gabapentin (NEURONTIN) 100 MG capsule Take 1 capsule (100 mg total) by mouth 3 (three) times daily. 90 capsule 1   . sertraline (ZOLOFT) 50 MG tablet Take 1 tablet (50 mg total) by mouth daily. For mood control 30 tablet 1     Patient  Stressors: Health problems Substance abuse Traumatic event  Patient Strengths: Average or above average intelligence Communication skills General fund of knowledge Supportive family/friends  Treatment Modalities: Medication Management, Group therapy, Case management,  1 to 1 session with clinician, Psychoeducation, Recreational therapy.   Physician Treatment Plan for Primary Diagnosis: Severe recurrent major depression without psychotic features  Long Term Goal(s): Improvement in symptoms so as ready for discharge Improvement in symptoms so as ready for discharge   Short Term Goals: Ability to identify changes in lifestyle to reduce recurrence of condition will improve Ability to verbalize feelings will improve Ability to disclose and discuss suicidal ideas Ability to demonstrate self-control will improve Ability to identify and develop effective coping behaviors will improve Ability to maintain clinical measurements within normal limits will improve Ability to identify triggers associated with substance abuse/mental health issues will improve Ability to identify changes in lifestyle to reduce recurrence of condition will improve Ability to verbalize feelings will improve Ability to disclose and discuss suicidal ideas Ability to demonstrate self-control will improve Ability to identify and develop effective coping behaviors will improve Ability to maintain clinical measurements within normal limits will improve Ability to identify triggers associated with substance abuse/mental health issues will improve  Medication Management: Evaluate patient's response, side effects, and tolerance of medication regimen.  Therapeutic Interventions: 1  to 1 sessions, Unit Group sessions and Medication administration.  Evaluation of Outcomes: Adequate for Discharge  Physician Treatment Plan for Secondary Diagnosis: Principal Problem:   Severe recurrent major depression without psychotic  features (HCC)  Long Term Goal(s): Improvement in symptoms so as ready for discharge Improvement in symptoms so as ready for discharge   Short Term Goals: Ability to identify changes in lifestyle to reduce recurrence of condition will improve Ability to verbalize feelings will improve Ability to disclose and discuss suicidal ideas Ability to demonstrate self-control will improve Ability to identify and develop effective coping behaviors will improve Ability to maintain clinical measurements within normal limits will improve Ability to identify triggers associated with substance abuse/mental health issues will improve Ability to identify changes in lifestyle to reduce recurrence of condition will improve Ability to verbalize feelings will improve Ability to disclose and discuss suicidal ideas Ability to demonstrate self-control will improve Ability to identify and develop effective coping behaviors will improve Ability to maintain clinical measurements within normal limits will improve Ability to identify triggers associated with substance abuse/mental health issues will improve     Medication Management: Evaluate patient's response, side effects, and tolerance of medication regimen.  Therapeutic Interventions: 1 to 1 sessions, Unit Group sessions and Medication administration.  Evaluation of Outcomes: Adequate for Discharge   RN Treatment Plan for Primary Diagnosis: Severe recurrent major depression without psychotic features  Long Term Goal(s): Knowledge of disease and therapeutic regimen to maintain health will improve  Short Term Goals: Ability to demonstrate self-control, Ability to participate in decision making will improve, Ability to verbalize feelings will improve and Ability to disclose and discuss suicidal ideas  Medication Management: RN will administer medications as ordered by provider, will assess and evaluate patient's response and provide education to patient for  prescribed medication. RN will report any adverse and/or side effects to prescribing provider.  Therapeutic Interventions: 1 on 1 counseling sessions, Psychoeducation, Medication administration, Evaluate responses to treatment, Monitor vital signs and CBGs as ordered, Perform/monitor CIWA, COWS, AIMS and Fall Risk screenings as ordered, Perform wound care treatments as ordered.  Evaluation of Outcomes: Adequate for Discharge   LCSW Treatment Plan for Primary Diagnosis: Severe recurrent major depression without psychotic features  Long Term Goal(s): Safe transition to appropriate next level of care at discharge, Engage patient in therapeutic group addressing interpersonal concerns.  Short Term Goals: Engage patient in aftercare planning with referrals and resources, Increase ability to appropriately verbalize feelings, Increase emotional regulation, Identify triggers associated with mental health/substance abuse issues and Increase skills for wellness and recovery  Therapeutic Interventions: Assess for all discharge needs, 1 to 1 time with Social worker, Explore available resources and support systems, Assess for adequacy in community support network, Educate family and significant other(s) on suicide prevention, Complete Psychosocial Assessment, Interpersonal group therapy.  Evaluation of Outcomes: Adequate for Discharge   Progress in Treatment: Attending groups: Yes  Participating in groups: Yes Taking medication as prescribed: Yes. Toleration medication: Yes. Family/Significant other contact made: Yes, wife Patient understands diagnosis: Yes. Discussing patient identified problems/goals with staff: Yes. Medical problems stabilized or resolved: Yes. Denies suicidal/homicidal ideation: Yes Issues/concerns per patient self-inventory: No. Other: n/a  New problem(s) identified: Yes; pt has a warrant for his arrest, pt has agreed to turn himself in after discharge today 09/01/18  New  Short Term/Long Term Goal(s): detox, medication management for mood stabilization; development of comprehensive mental wellness/sobriety plan.    Patient Goals:  "Change my way of thinking and get  into a long-term program"  Discharge Plan or Barriers: [facility] referral made, Pt has PHP intake scheduled for Monday 09/04/18. MHAG pamphlet, Mobile Crisis information, and AA/NA information provided to patient for additional community support and resources.    Reason for Continuation of Hospitalization: none  Estimated Length of Stay: Friday 09/01/2018  Attendees: Patient: Scott Scott [name] 09/01/2018 8:46 AM  Physician: Dr. Jola Babinskilary MD 09/01/2018 8:46 AM  Nursing: Huntley DecSara RN; Patrice RN 09/01/2018 8:46 AM  RN Care Manager:x 09/01/2018 8:46 AM  Social Worker: Corrie MckusickHeather Yvonne Stopher LCSW 09/01/2018 8:46 AM  Recreational Therapist: x 09/01/2018 8:46 AM  Other: Marciano SequinJanet Sykes NP 09/01/2018 8:46 AM  Other:  09/01/2018 8:46 AM  Other: 09/01/2018 8:46 AM    Scribe for Treatment Team: Rona RavensHeather S Zykera Abella, LCSW 09/01/2018 8:46 AM

## 2018-09-01 NOTE — Progress Notes (Signed)
Pt denies SI/HI. Pt received both written and verbal discharge instructions. Pt verbalized understanding of discharge instructions. Pt agreed to f/u appt and med regimen. Pt received prescriptions, AVS, SRA, transitional record and a suicide prevention worksheet. Pt gathered belongings from room and locker. Pt returned a black backpack that was in his locker to Clinical research associate. Writer returned the lost backpack to security. Pt safely discharged to the lobby.

## 2018-09-01 NOTE — BHH Suicide Risk Assessment (Signed)
[facility] Discharge Suicide Risk Assessment   Principal Problem: Severe recurrent major depression without psychotic features [facility]) Discharge Diagnoses: Principal Problem:   Severe recurrent major depression without psychotic features (HCC)   Total Time spent with patient: 30 minutes Currently alert oriented cooperative intrusive no thoughts of harming self or others contracting fully No cravings tremors or withdrawal no psychosis minimizing legal issues on discussion Mental Status Per Nursing Assessment::   On Admission:  Suicidal ideation indicated by patient, Self-harm thoughts, Self-harm behaviors, Suicide plan, Plan includes specific time, place, or method, Intention to act on suicide plan, Belief that plan would result in death  Demographic Factors:  Male  Loss Factors: Legal issues  Historical Factors: Impulsivity  Risk Reduction Factors:   Sense of responsibility to family  Continued Clinical Symptoms:  Alcohol/Substance Abuse/Dependencies  Cognitive Features That Contribute To Risk:  None    Suicide Risk:  Mild:  Suicidal ideation of limited frequency, intensity, duration, and specificity.  There are no identifiable plans, no associated intent, mild dysphoria and related symptoms, good self-control (both objective and subjective assessment), few other risk factors, and identifiable protective factors, including available and accessible social support.  Follow-up Information    [facility] Follow up.   Why:  Referral was made on 1/21. Please follow up to inqure about possible bed availability.  Contact information: [address] [place] [address] [phone]        [facility]. Go on 09/04/2018.   Why:  Your intake appointment for the Partial Hospitalization Program is Monday, 1/27 at 8:30a. Please bring your photo ID, proof of insurance, SSN, current medications and any discharge paperwork from this  hospitalization.  Contact information: [address] [facility] [place] [address] p: [phone] f: [phone]          Plan Of Care/Follow-up recommendations:  Activity:  full  Tye Juarez, MD 09/01/2018, 8:13 AM

## 2018-09-01 NOTE — Progress Notes (Signed)
D: Patient observed in dayroom with peers watching a movie. Patient states, "I'm leaving tomorrow. I feel okay with it." Patient's affect animated, mood pleasant. Patient is cooperative however somewhat flirtatious with this Clinical research associate.  Denies pain, physical complaints.   A: Medicated per orders, no prns requested or required. Medication education provided. Level III obs in place for safety. Emotional support offered. Patient encouraged to complete Suicide Safety Plan before discharge. Encouraged to attend and participate in unit programming.   R: Patient verbalizes understanding of POC. Patient denies SI/HI/AVH and remains safe on level III obs. Will continue to monitor throughout the night.

## 2018-09-01 NOTE — Progress Notes (Addendum)
  [facility] Adult Case Management Discharge Plan :  Will you be returning to the same living situation after discharge:  Yes,  pt will return hom, however he is planning to go to police department to inquire about potential warrants At discharge, do you have transportation home?: Yes,  wife picking him up at 9:00 am Do you have the ability to pay for your medications: Yes,  [facility]  Release of information consent forms completed and submitted to medical records by CSW.  Patient to Follow up at: Follow-up Information    [facility] Follow up.   Why:  Referral was made on 1/21. Please follow up to inqure about possible bed availability.  Contact information: [address] [place] [address] [phone]        [facility]. Go on 09/04/2018.   Why:  Your intake appointment for the Partial Hospitalization Program is Monday, 1/27 at 8:30a. Please bring your photo ID, proof of insurance, SSN, current medications and any discharge paperwork from this hospitalization.  Contact information: [address] [place] [address] p: [phone] f: [phone]          Next level of care provider has access to [facility] Link:no  Safety Planning and Suicide Prevention discussed: Yes,  SPE completed with wife; SPI and mobile crisis pamphlet provided to pt  Have you used any form of tobacco in the last 30 days? (Cigarettes, Smokeless Tobacco, Cigars, and/or Pipes): Yes  Has patient been referred to the Quitline?: Patient refused referral  Patient has been referred for addiction treatment: Yes  Rona Ravens, LCSW 09/01/2018, 8:51 AM

## 2018-09-01 NOTE — Progress Notes (Signed)
Recreation Therapy Notes  Date:  1.24.20 Time: 0930 Location: 300 Hall Dayroom  Group Topic: Stress Management  Goal Area(s) Addresses:  Patient will identify positive stress management techniques. Patient will identify benefits of using stress management post d/c.  Intervention: Stress Management  Activity :  Meditation.  LRT played a meditation that dealt with having choice.  Patients were to listen to the meditation to follow along and engage.  Education:  Stress Management, Discharge Planning.   Education Outcome: Acknowledges Education  Clinical Observations/Feedback:  Pt did not attend group.     Caroll Rancher, LRT/CTRS         Lillia Abed, Janny Crute A 09/01/2018 11:09 AM

## 2018-10-07 DIAGNOSIS — M549 Dorsalgia, unspecified: Secondary | ICD-10-CM | POA: Diagnosis not present

## 2018-10-07 DIAGNOSIS — F17210 Nicotine dependence, cigarettes, uncomplicated: Secondary | ICD-10-CM | POA: Diagnosis not present

## 2018-10-07 DIAGNOSIS — M47812 Spondylosis without myelopathy or radiculopathy, cervical region: Secondary | ICD-10-CM | POA: Diagnosis not present

## 2018-10-07 DIAGNOSIS — R202 Paresthesia of skin: Secondary | ICD-10-CM | POA: Diagnosis not present

## 2018-10-07 DIAGNOSIS — G8929 Other chronic pain: Secondary | ICD-10-CM | POA: Diagnosis not present

## 2018-10-07 DIAGNOSIS — Z8619 Personal history of other infectious and parasitic diseases: Secondary | ICD-10-CM | POA: Diagnosis not present

## 2018-10-07 DIAGNOSIS — Z79899 Other long term (current) drug therapy: Secondary | ICD-10-CM | POA: Diagnosis not present

## 2018-10-07 DIAGNOSIS — R531 Weakness: Secondary | ICD-10-CM | POA: Diagnosis not present

## 2018-10-07 DIAGNOSIS — I10 Essential (primary) hypertension: Secondary | ICD-10-CM | POA: Diagnosis not present

## 2018-10-07 DIAGNOSIS — Z91040 Latex allergy status: Secondary | ICD-10-CM | POA: Diagnosis not present

## 2018-10-07 DIAGNOSIS — Z888 Allergy status to other drugs, medicaments and biological substances status: Secondary | ICD-10-CM | POA: Diagnosis not present

## 2018-10-07 DIAGNOSIS — J449 Chronic obstructive pulmonary disease, unspecified: Secondary | ICD-10-CM | POA: Diagnosis not present

## 2018-10-07 DIAGNOSIS — M542 Cervicalgia: Secondary | ICD-10-CM | POA: Diagnosis not present

## 2018-10-07 DIAGNOSIS — Z885 Allergy status to narcotic agent status: Secondary | ICD-10-CM | POA: Diagnosis not present

## 2018-10-14 DIAGNOSIS — M47812 Spondylosis without myelopathy or radiculopathy, cervical region: Secondary | ICD-10-CM | POA: Diagnosis not present

## 2018-10-14 DIAGNOSIS — M25512 Pain in left shoulder: Secondary | ICD-10-CM | POA: Diagnosis not present

## 2018-10-14 DIAGNOSIS — R202 Paresthesia of skin: Secondary | ICD-10-CM | POA: Diagnosis not present

## 2018-10-14 DIAGNOSIS — G8929 Other chronic pain: Secondary | ICD-10-CM | POA: Diagnosis not present

## 2018-10-14 DIAGNOSIS — I10 Essential (primary) hypertension: Secondary | ICD-10-CM | POA: Diagnosis not present

## 2018-10-14 DIAGNOSIS — Z79899 Other long term (current) drug therapy: Secondary | ICD-10-CM | POA: Diagnosis not present

## 2018-10-14 DIAGNOSIS — Z8669 Personal history of other diseases of the nervous system and sense organs: Secondary | ICD-10-CM | POA: Diagnosis not present

## 2018-10-14 DIAGNOSIS — F4310 Post-traumatic stress disorder, unspecified: Secondary | ICD-10-CM | POA: Diagnosis not present

## 2018-10-14 DIAGNOSIS — F17210 Nicotine dependence, cigarettes, uncomplicated: Secondary | ICD-10-CM | POA: Diagnosis not present

## 2018-10-14 DIAGNOSIS — Z886 Allergy status to analgesic agent status: Secondary | ICD-10-CM | POA: Diagnosis not present

## 2018-10-14 DIAGNOSIS — F1410 Cocaine abuse, uncomplicated: Secondary | ICD-10-CM | POA: Diagnosis not present

## 2018-10-14 DIAGNOSIS — G40909 Epilepsy, unspecified, not intractable, without status epilepticus: Secondary | ICD-10-CM | POA: Diagnosis not present

## 2018-10-14 DIAGNOSIS — S161XXA Strain of muscle, fascia and tendon at neck level, initial encounter: Secondary | ICD-10-CM | POA: Diagnosis not present

## 2018-10-14 DIAGNOSIS — J449 Chronic obstructive pulmonary disease, unspecified: Secondary | ICD-10-CM | POA: Diagnosis not present

## 2018-10-14 DIAGNOSIS — Z888 Allergy status to other drugs, medicaments and biological substances status: Secondary | ICD-10-CM | POA: Diagnosis not present

## 2018-10-14 DIAGNOSIS — M549 Dorsalgia, unspecified: Secondary | ICD-10-CM | POA: Diagnosis not present

## 2018-10-14 DIAGNOSIS — Z8619 Personal history of other infectious and parasitic diseases: Secondary | ICD-10-CM | POA: Diagnosis not present

## 2018-10-14 DIAGNOSIS — S161XXD Strain of muscle, fascia and tendon at neck level, subsequent encounter: Secondary | ICD-10-CM | POA: Diagnosis not present

## 2018-10-14 DIAGNOSIS — F1020 Alcohol dependence, uncomplicated: Secondary | ICD-10-CM | POA: Diagnosis not present

## 2018-10-14 DIAGNOSIS — Z91040 Latex allergy status: Secondary | ICD-10-CM | POA: Diagnosis not present

## 2018-10-14 DIAGNOSIS — M5412 Radiculopathy, cervical region: Secondary | ICD-10-CM | POA: Diagnosis not present

## 2018-10-14 DIAGNOSIS — F329 Major depressive disorder, single episode, unspecified: Secondary | ICD-10-CM | POA: Diagnosis not present

## 2018-10-17 DIAGNOSIS — R251 Tremor, unspecified: Secondary | ICD-10-CM | POA: Diagnosis not present

## 2018-10-17 DIAGNOSIS — F1020 Alcohol dependence, uncomplicated: Secondary | ICD-10-CM | POA: Diagnosis not present

## 2018-10-17 DIAGNOSIS — F10230 Alcohol dependence with withdrawal, uncomplicated: Secondary | ICD-10-CM | POA: Diagnosis not present

## 2018-10-17 DIAGNOSIS — Z915 Personal history of self-harm: Secondary | ICD-10-CM | POA: Diagnosis not present

## 2018-10-17 DIAGNOSIS — Z8659 Personal history of other mental and behavioral disorders: Secondary | ICD-10-CM | POA: Diagnosis not present

## 2018-10-17 DIAGNOSIS — Z8709 Personal history of other diseases of the respiratory system: Secondary | ICD-10-CM | POA: Diagnosis not present

## 2018-10-17 DIAGNOSIS — M542 Cervicalgia: Secondary | ICD-10-CM | POA: Diagnosis not present

## 2018-10-17 DIAGNOSIS — F1911 Other psychoactive substance abuse, in remission: Secondary | ICD-10-CM | POA: Diagnosis not present

## 2018-10-17 DIAGNOSIS — R451 Restlessness and agitation: Secondary | ICD-10-CM | POA: Diagnosis not present

## 2018-10-17 DIAGNOSIS — M549 Dorsalgia, unspecified: Secondary | ICD-10-CM | POA: Diagnosis not present

## 2018-10-19 MED ORDER — LORAZEPAM 1 MG PO TABS
2.00 | ORAL_TABLET | ORAL | Status: DC
Start: ? — End: 2018-10-19

## 2018-10-19 MED ORDER — ONDANSETRON 4 MG PO TBDP
4.00 | ORAL_TABLET | ORAL | Status: DC
Start: ? — End: 2018-10-19

## 2018-10-19 MED ORDER — GENERIC EXTERNAL MEDICATION
2.00 | Status: DC
Start: ? — End: 2018-10-19

## 2018-10-20 ENCOUNTER — Other Ambulatory Visit: Payer: Self-pay

## 2018-10-20 NOTE — Patient Outreach (Signed)
[facility]) Care Management  10/20/2018  WYNTON ELSTAD [birth year] [id number]   Referral Date: 10/19/2018 Referral Source: [facility] referral Referral Reason: 8 ED visits within 6 months   Outreach Attempt: no answer.  HIPAA compliant voice message left.   Plan: RN CM will attempt again within 4 business days and send letter.   [name], RN, MSN [facility] Care Management Care Management Coordinator Direct Line [phone] Toll Free: [phone]  Fax: [phone]

## 2018-10-21 DIAGNOSIS — F329 Major depressive disorder, single episode, unspecified: Secondary | ICD-10-CM | POA: Diagnosis not present

## 2018-10-21 DIAGNOSIS — G8929 Other chronic pain: Secondary | ICD-10-CM | POA: Diagnosis not present

## 2018-10-21 DIAGNOSIS — F4310 Post-traumatic stress disorder, unspecified: Secondary | ICD-10-CM | POA: Diagnosis not present

## 2018-10-21 DIAGNOSIS — R45851 Suicidal ideations: Secondary | ICD-10-CM | POA: Diagnosis not present

## 2018-10-21 DIAGNOSIS — F1094 Alcohol use, unspecified with alcohol-induced mood disorder: Secondary | ICD-10-CM | POA: Diagnosis not present

## 2018-10-21 DIAGNOSIS — F1210 Cannabis abuse, uncomplicated: Secondary | ICD-10-CM | POA: Diagnosis not present

## 2018-10-21 DIAGNOSIS — F17210 Nicotine dependence, cigarettes, uncomplicated: Secondary | ICD-10-CM | POA: Diagnosis not present

## 2018-10-21 DIAGNOSIS — R7989 Other specified abnormal findings of blood chemistry: Secondary | ICD-10-CM | POA: Diagnosis not present

## 2018-10-21 DIAGNOSIS — R9431 Abnormal electrocardiogram [ECG] [EKG]: Secondary | ICD-10-CM | POA: Diagnosis not present

## 2018-10-21 DIAGNOSIS — F1410 Cocaine abuse, uncomplicated: Secondary | ICD-10-CM | POA: Diagnosis not present

## 2018-10-22 DIAGNOSIS — F329 Major depressive disorder, single episode, unspecified: Secondary | ICD-10-CM | POA: Diagnosis not present

## 2018-10-22 DIAGNOSIS — F1210 Cannabis abuse, uncomplicated: Secondary | ICD-10-CM | POA: Diagnosis not present

## 2018-10-22 DIAGNOSIS — R45851 Suicidal ideations: Secondary | ICD-10-CM | POA: Diagnosis not present

## 2018-10-22 DIAGNOSIS — F1410 Cocaine abuse, uncomplicated: Secondary | ICD-10-CM | POA: Diagnosis not present

## 2018-10-23 ENCOUNTER — Other Ambulatory Visit: Payer: Self-pay

## 2018-10-23 DIAGNOSIS — F1410 Cocaine abuse, uncomplicated: Secondary | ICD-10-CM | POA: Diagnosis not present

## 2018-10-23 DIAGNOSIS — R9431 Abnormal electrocardiogram [ECG] [EKG]: Secondary | ICD-10-CM | POA: Diagnosis not present

## 2018-10-23 DIAGNOSIS — R45851 Suicidal ideations: Secondary | ICD-10-CM | POA: Diagnosis not present

## 2018-10-23 DIAGNOSIS — F329 Major depressive disorder, single episode, unspecified: Secondary | ICD-10-CM | POA: Diagnosis not present

## 2018-10-23 DIAGNOSIS — F1210 Cannabis abuse, uncomplicated: Secondary | ICD-10-CM | POA: Diagnosis not present

## 2018-10-23 NOTE — Patient Outreach (Signed)
[facility]) Care Management  10/23/2018  Scott [name] Jul 17, 1971 [id number]   Referral Date: 10/19/2018 Referral Source: [facility] referral Referral Reason: 8 ED visits within 6 months   Outreach Attempt: no answer.  HIPAA compliant voice message left.   Plan: RN CM will attempt again within 4 business days.  [name], RN, MSN [facility] Care Management Care Management Coordinator Direct Line [phone] Cell [phone] Toll Free: [phone]  Fax: [phone]

## 2018-10-24 ENCOUNTER — Other Ambulatory Visit: Payer: Self-pay

## 2018-10-24 DIAGNOSIS — F1410 Cocaine abuse, uncomplicated: Secondary | ICD-10-CM | POA: Diagnosis not present

## 2018-10-24 DIAGNOSIS — R45851 Suicidal ideations: Secondary | ICD-10-CM | POA: Diagnosis not present

## 2018-10-24 DIAGNOSIS — F329 Major depressive disorder, single episode, unspecified: Secondary | ICD-10-CM | POA: Diagnosis not present

## 2018-10-24 DIAGNOSIS — F1210 Cannabis abuse, uncomplicated: Secondary | ICD-10-CM | POA: Diagnosis not present

## 2018-10-24 NOTE — Patient Outreach (Signed)
[facility]) Care Management  10/24/2018  Scott [name] 07/05/1971 [id number]   Referral Date:10/19/2018 Referral Source:[facility] referral Referral Reason:8 ED visits within 6 months   Outreach Attempt:no answer. HIPAA compliant voice message left.   Plan:RN CM will wait return call.  If no return call will close case.    [name], RN, MSN [facility] Care Management Care Management Coordinator Direct Line [phone] Cell [phone] Toll Free: [phone]  Fax: [phone]

## 2018-10-25 DIAGNOSIS — F1220 Cannabis dependence, uncomplicated: Secondary | ICD-10-CM | POA: Diagnosis not present

## 2018-10-25 DIAGNOSIS — G8929 Other chronic pain: Secondary | ICD-10-CM | POA: Diagnosis not present

## 2018-10-25 DIAGNOSIS — R0789 Other chest pain: Secondary | ICD-10-CM | POA: Diagnosis not present

## 2018-10-25 DIAGNOSIS — Z915 Personal history of self-harm: Secondary | ICD-10-CM | POA: Diagnosis not present

## 2018-10-25 DIAGNOSIS — F329 Major depressive disorder, single episode, unspecified: Secondary | ICD-10-CM | POA: Diagnosis not present

## 2018-10-25 DIAGNOSIS — F10239 Alcohol dependence with withdrawal, unspecified: Secondary | ICD-10-CM | POA: Diagnosis not present

## 2018-10-25 DIAGNOSIS — F4310 Post-traumatic stress disorder, unspecified: Secondary | ICD-10-CM | POA: Diagnosis not present

## 2018-10-25 DIAGNOSIS — Z720 Tobacco use: Secondary | ICD-10-CM | POA: Diagnosis not present

## 2018-10-25 DIAGNOSIS — F1410 Cocaine abuse, uncomplicated: Secondary | ICD-10-CM | POA: Diagnosis not present

## 2018-10-25 DIAGNOSIS — I10 Essential (primary) hypertension: Secondary | ICD-10-CM | POA: Diagnosis not present

## 2018-10-25 DIAGNOSIS — F3163 Bipolar disorder, current episode mixed, severe, without psychotic features: Secondary | ICD-10-CM | POA: Diagnosis not present

## 2018-10-25 DIAGNOSIS — R200 Anesthesia of skin: Secondary | ICD-10-CM | POA: Diagnosis not present

## 2018-10-25 DIAGNOSIS — G629 Polyneuropathy, unspecified: Secondary | ICD-10-CM | POA: Diagnosis not present

## 2018-10-25 DIAGNOSIS — F1420 Cocaine dependence, uncomplicated: Secondary | ICD-10-CM | POA: Diagnosis not present

## 2018-10-25 DIAGNOSIS — M25512 Pain in left shoulder: Secondary | ICD-10-CM | POA: Diagnosis not present

## 2018-10-25 DIAGNOSIS — B182 Chronic viral hepatitis C: Secondary | ICD-10-CM | POA: Diagnosis not present

## 2018-10-25 DIAGNOSIS — J45909 Unspecified asthma, uncomplicated: Secondary | ICD-10-CM | POA: Diagnosis not present

## 2018-10-25 DIAGNOSIS — F1210 Cannabis abuse, uncomplicated: Secondary | ICD-10-CM | POA: Diagnosis not present

## 2018-10-25 DIAGNOSIS — R45851 Suicidal ideations: Secondary | ICD-10-CM | POA: Diagnosis not present

## 2018-10-26 DIAGNOSIS — G8929 Other chronic pain: Secondary | ICD-10-CM | POA: Diagnosis not present

## 2018-10-26 DIAGNOSIS — R0789 Other chest pain: Secondary | ICD-10-CM | POA: Diagnosis not present

## 2018-10-26 DIAGNOSIS — M25512 Pain in left shoulder: Secondary | ICD-10-CM | POA: Diagnosis not present

## 2018-10-26 DIAGNOSIS — R200 Anesthesia of skin: Secondary | ICD-10-CM | POA: Diagnosis not present

## 2018-11-03 ENCOUNTER — Other Ambulatory Visit: Payer: Self-pay

## 2018-11-03 NOTE — Patient Outreach (Signed)
[facility]) Care Management  11/03/2018  ALDOR CORBO 1970-10-10 [id number]   Multiple attempts to establish contact with patient without success. No response from letter mailed to patient.   Plan: RN CM will close case at this time.   [name], RN, MSN [facility] Care Management Care Management Coordinator Direct Line [phone] Cell [phone] Toll Free: [phone]  Fax: [phone]

## 2018-11-06 DIAGNOSIS — R45851 Suicidal ideations: Secondary | ICD-10-CM | POA: Diagnosis not present

## 2018-11-06 DIAGNOSIS — G4700 Insomnia, unspecified: Secondary | ICD-10-CM | POA: Diagnosis not present

## 2018-11-06 DIAGNOSIS — F329 Major depressive disorder, single episode, unspecified: Secondary | ICD-10-CM | POA: Diagnosis not present

## 2018-11-06 DIAGNOSIS — F4312 Post-traumatic stress disorder, chronic: Secondary | ICD-10-CM | POA: Diagnosis not present

## 2018-11-06 DIAGNOSIS — S60812A Abrasion of left wrist, initial encounter: Secondary | ICD-10-CM | POA: Diagnosis not present

## 2018-11-06 DIAGNOSIS — Z62810 Personal history of physical and sexual abuse in childhood: Secondary | ICD-10-CM | POA: Diagnosis not present

## 2018-11-06 DIAGNOSIS — Z915 Personal history of self-harm: Secondary | ICD-10-CM | POA: Diagnosis not present

## 2018-11-06 DIAGNOSIS — Y999 Unspecified external cause status: Secondary | ICD-10-CM | POA: Diagnosis not present

## 2018-11-06 DIAGNOSIS — F314 Bipolar disorder, current episode depressed, severe, without psychotic features: Secondary | ICD-10-CM | POA: Diagnosis not present

## 2018-11-06 DIAGNOSIS — T1491XA Suicide attempt, initial encounter: Secondary | ICD-10-CM | POA: Diagnosis not present

## 2018-11-06 DIAGNOSIS — S50812A Abrasion of left forearm, initial encounter: Secondary | ICD-10-CM | POA: Diagnosis not present

## 2018-11-06 DIAGNOSIS — Z9114 Patient's other noncompliance with medication regimen: Secondary | ICD-10-CM | POA: Diagnosis not present

## 2018-11-06 DIAGNOSIS — F1120 Opioid dependence, uncomplicated: Secondary | ICD-10-CM | POA: Diagnosis not present

## 2018-11-06 DIAGNOSIS — J449 Chronic obstructive pulmonary disease, unspecified: Secondary | ICD-10-CM | POA: Diagnosis not present

## 2018-11-06 DIAGNOSIS — X788XXA Intentional self-harm by other sharp object, initial encounter: Secondary | ICD-10-CM | POA: Diagnosis not present

## 2018-11-06 DIAGNOSIS — F1410 Cocaine abuse, uncomplicated: Secondary | ICD-10-CM | POA: Diagnosis not present

## 2018-11-06 DIAGNOSIS — F1020 Alcohol dependence, uncomplicated: Secondary | ICD-10-CM | POA: Diagnosis not present

## 2018-11-06 DIAGNOSIS — I10 Essential (primary) hypertension: Secondary | ICD-10-CM | POA: Diagnosis not present

## 2018-11-06 DIAGNOSIS — F17210 Nicotine dependence, cigarettes, uncomplicated: Secondary | ICD-10-CM | POA: Diagnosis not present

## 2018-11-30 DIAGNOSIS — Z79891 Long term (current) use of opiate analgesic: Secondary | ICD-10-CM | POA: Diagnosis not present

## 2018-11-30 DIAGNOSIS — F1910 Other psychoactive substance abuse, uncomplicated: Secondary | ICD-10-CM | POA: Diagnosis not present

## 2018-11-30 DIAGNOSIS — Z79899 Other long term (current) drug therapy: Secondary | ICD-10-CM | POA: Diagnosis not present

## 2018-11-30 DIAGNOSIS — F1120 Opioid dependence, uncomplicated: Secondary | ICD-10-CM | POA: Diagnosis not present

## 2018-11-30 DIAGNOSIS — F17210 Nicotine dependence, cigarettes, uncomplicated: Secondary | ICD-10-CM | POA: Diagnosis not present

## 2018-12-06 DIAGNOSIS — Z8619 Personal history of other infectious and parasitic diseases: Secondary | ICD-10-CM | POA: Diagnosis not present

## 2018-12-06 DIAGNOSIS — Z79891 Long term (current) use of opiate analgesic: Secondary | ICD-10-CM | POA: Diagnosis not present

## 2018-12-06 DIAGNOSIS — F1120 Opioid dependence, uncomplicated: Secondary | ICD-10-CM | POA: Diagnosis not present

## 2018-12-06 DIAGNOSIS — Z79899 Other long term (current) drug therapy: Secondary | ICD-10-CM | POA: Diagnosis not present

## 2019-03-15 ENCOUNTER — Encounter (HOSPITAL_COMMUNITY): Payer: Self-pay | Admitting: Emergency Medicine

## 2019-03-15 ENCOUNTER — Emergency Department (HOSPITAL_COMMUNITY): Payer: Medicare HMO

## 2019-03-15 ENCOUNTER — Other Ambulatory Visit: Payer: Self-pay

## 2019-03-15 ENCOUNTER — Inpatient Hospital Stay (HOSPITAL_COMMUNITY)
Admission: EM | Admit: 2019-03-15 | Discharge: 2019-03-19 | DRG: 603 | Disposition: A | Discharge status: Home / Self Care | Payer: Medicare HMO | Attending: Internal Medicine | Admitting: Internal Medicine

## 2019-03-15 DIAGNOSIS — L739 Follicular disorder, unspecified: Secondary | ICD-10-CM | POA: Diagnosis present

## 2019-03-15 DIAGNOSIS — M549 Dorsalgia, unspecified: Secondary | ICD-10-CM | POA: Diagnosis present

## 2019-03-15 DIAGNOSIS — B1920 Unspecified viral hepatitis C without hepatic coma: Secondary | ICD-10-CM | POA: Diagnosis present

## 2019-03-15 DIAGNOSIS — F1910 Other psychoactive substance abuse, uncomplicated: Secondary | ICD-10-CM | POA: Diagnosis present

## 2019-03-15 DIAGNOSIS — Z91040 Latex allergy status: Secondary | ICD-10-CM

## 2019-03-15 DIAGNOSIS — Z20828 Contact with and (suspected) exposure to other viral communicable diseases: Secondary | ICD-10-CM | POA: Diagnosis present

## 2019-03-15 DIAGNOSIS — G8929 Other chronic pain: Secondary | ICD-10-CM | POA: Diagnosis present

## 2019-03-15 DIAGNOSIS — F419 Anxiety disorder, unspecified: Secondary | ICD-10-CM | POA: Diagnosis present

## 2019-03-15 DIAGNOSIS — I252 Old myocardial infarction: Secondary | ICD-10-CM

## 2019-03-15 DIAGNOSIS — R748 Abnormal levels of other serum enzymes: Secondary | ICD-10-CM | POA: Diagnosis present

## 2019-03-15 DIAGNOSIS — Z886 Allergy status to analgesic agent status: Secondary | ICD-10-CM

## 2019-03-15 DIAGNOSIS — L03114 Cellulitis of left upper limb: Secondary | ICD-10-CM | POA: Diagnosis present

## 2019-03-15 DIAGNOSIS — Z79899 Other long term (current) drug therapy: Secondary | ICD-10-CM

## 2019-03-15 DIAGNOSIS — R7401 Elevation of levels of liver transaminase levels: Secondary | ICD-10-CM

## 2019-03-15 DIAGNOSIS — F332 Major depressive disorder, recurrent severe without psychotic features: Secondary | ICD-10-CM | POA: Diagnosis present

## 2019-03-15 DIAGNOSIS — K219 Gastro-esophageal reflux disease without esophagitis: Secondary | ICD-10-CM | POA: Diagnosis present

## 2019-03-15 DIAGNOSIS — F1120 Opioid dependence, uncomplicated: Secondary | ICD-10-CM | POA: Diagnosis present

## 2019-03-15 DIAGNOSIS — F17210 Nicotine dependence, cigarettes, uncomplicated: Secondary | ICD-10-CM | POA: Diagnosis present

## 2019-03-15 DIAGNOSIS — Z6841 Body Mass Index (BMI) 40.0 and over, adult: Secondary | ICD-10-CM

## 2019-03-15 DIAGNOSIS — R569 Unspecified convulsions: Secondary | ICD-10-CM | POA: Diagnosis present

## 2019-03-15 DIAGNOSIS — F17200 Nicotine dependence, unspecified, uncomplicated: Secondary | ICD-10-CM | POA: Diagnosis present

## 2019-03-15 DIAGNOSIS — F319 Bipolar disorder, unspecified: Secondary | ICD-10-CM | POA: Diagnosis present

## 2019-03-15 DIAGNOSIS — Z888 Allergy status to other drugs, medicaments and biological substances status: Secondary | ICD-10-CM

## 2019-03-15 LAB — ETHANOL: Alcohol, Ethyl (B): <10 mg/dL (ref <10)

## 2019-03-15 LAB — COMPREHENSIVE METABOLIC PANEL
ALT: 179 U/L — ABNORMAL HIGH (ref 0–44)
AST: 124 U/L — ABNORMAL HIGH (ref 15–41)
Albumin: 3.7 g/dL (ref 3.5–5.0)
Alkaline Phosphatase: 102 U/L (ref 38–126)
Anion gap: 9 (ref 5–15)
BUN: 7 mg/dL (ref 6–20)
CO2: 28 mmol/L (ref 22–32)
Calcium: 8.6 mg/dL — ABNORMAL LOW (ref 8.9–10.3)
Chloride: 99 mmol/L (ref 98–111)
Creatinine, Ser: 0.75 mg/dL (ref 0.61–1.24)
GFR calc Af Amer: >60 mL/min (ref >60)
GFR calc non Af Amer: >60 mL/min (ref >60)
Glucose, Bld: 105 mg/dL — ABNORMAL HIGH (ref 70–99)
Potassium: 3.9 mmol/L (ref 3.5–5.1)
Sodium: 136 mmol/L (ref 135–145)
Total Bilirubin: 0.8 mg/dL (ref 0.3–1.2)
Total Protein: 7.2 g/dL (ref 6.5–8.1)

## 2019-03-15 LAB — CBC WITH DIFFERENTIAL/PLATELET
Abs Immature Granulocytes: 0.02 10*3/uL (ref 0.00–0.07)
Basophils Absolute: 0.1 10*3/uL (ref 0.0–0.1)
Basophils Relative: 1 %
Eosinophils Absolute: 0.2 10*3/uL (ref 0.0–0.5)
Eosinophils Relative: 2 %
HCT: 48.9 % (ref 39.0–52.0)
Hemoglobin: 17.1 g/dL — ABNORMAL HIGH (ref 13.0–17.0)
Immature Granulocytes: 0 %
Lymphocytes Relative: 20 %
Lymphs Abs: 1.7 10*3/uL (ref 0.7–4.0)
MCH: 30.8 pg (ref 26.0–34.0)
MCHC: 35 g/dL (ref 30.0–36.0)
MCV: 87.9 fL (ref 80.0–100.0)
Monocytes Absolute: 0.8 10*3/uL (ref 0.1–1.0)
Monocytes Relative: 10 %
Neutro Abs: 5.7 10*3/uL (ref 1.7–7.7)
Neutrophils Relative %: 67 %
Platelets: 199 10*3/uL (ref 150–400)
RBC: 5.56 MIL/uL (ref 4.22–5.81)
RDW: 12.6 % (ref 11.5–15.5)
WBC: 8.5 10*3/uL (ref 4.0–10.5)
nRBC: 0 % (ref 0.0–0.2)

## 2019-03-15 LAB — URINALYSIS, ROUTINE W REFLEX MICROSCOPIC
Bacteria, UA: NONE SEEN
Bilirubin Urine: NEGATIVE
Glucose, UA: NEGATIVE mg/dL
Ketones, ur: NEGATIVE mg/dL
Leukocytes,Ua: NEGATIVE
Nitrite: NEGATIVE
Protein, ur: 30 mg/dL — AB
Specific Gravity, Urine: 1.017 (ref 1.005–1.030)
pH: 7 (ref 5.0–8.0)

## 2019-03-15 LAB — PROTIME-INR
INR: 1 (ref 0.8–1.2)
Prothrombin Time: 13.1 seconds (ref 11.4–15.2)

## 2019-03-15 LAB — LACTIC ACID, PLASMA
Lactic Acid, Venous: 1.9 mmol/L (ref 0.5–1.9)
Lactic Acid, Venous: 0.4 mmol/L — ABNORMAL LOW (ref 0.5–1.9)

## 2019-03-15 LAB — RAPID URINE DRUG SCREEN, HOSP PERFORMED
Amphetamines: POSITIVE — AB
Barbiturates: POSITIVE — AB
Benzodiazepines: NOT DETECTED
Cocaine: POSITIVE — AB
Opiates: NOT DETECTED
Tetrahydrocannabinol: NOT DETECTED

## 2019-03-15 LAB — SARS CORONAVIRUS 2 (TAT 6-24 HRS): SARS Coronavirus 2: NEGATIVE

## 2019-03-15 MED ORDER — SODIUM CHLORIDE 0.9 % IV SOLN
2.0000 g | Freq: Once | INTRAVENOUS | Status: AC
  Administered 2019-03-15: 2 g via INTRAVENOUS
  Filled 2019-03-15: qty 2

## 2019-03-15 MED ORDER — CEFAZOLIN SODIUM-DEXTROSE 2-4 GM/100ML-% IV SOLN
2.0000 g | Freq: Three times a day (TID) | INTRAVENOUS | Status: DC
  Administered 2019-03-15 – 2019-03-16 (×3): 2 g via INTRAVENOUS
  Filled 2019-03-15 (×5): qty 100

## 2019-03-15 MED ORDER — ACETAMINOPHEN 325 MG PO TABS
650.0000 mg | ORAL_TABLET | Freq: Four times a day (QID) | ORAL | Status: DC | PRN
  Administered 2019-03-15 – 2019-03-17 (×3): 650 mg via ORAL
  Filled 2019-03-15 (×3): qty 2

## 2019-03-15 MED ORDER — ENOXAPARIN SODIUM 60 MG/0.6ML ~~LOC~~ SOLN: 60.0000 mg | SUBCUTANEOUS | Status: DC

## 2019-03-15 MED ORDER — ENOXAPARIN SODIUM 80 MG/0.8ML ~~LOC~~ SOLN
70.0000 mg | SUBCUTANEOUS | Status: DC
  Administered 2019-03-15 – 2019-03-18 (×4): 70 mg via SUBCUTANEOUS
  Filled 2019-03-15 (×4): qty 0.8

## 2019-03-15 MED ORDER — BUPRENORPHINE HCL 8 MG SL SUBL
8.0000 mg | SUBLINGUAL_TABLET | Freq: Three times a day (TID) | SUBLINGUAL | Status: DC
  Administered 2019-03-15 – 2019-03-19 (×13): 8 mg via SUBLINGUAL
  Filled 2019-03-15 (×13): qty 1

## 2019-03-15 MED ORDER — ONDANSETRON HCL 4 MG PO TABS: 4.0000 mg | ORAL_TABLET | Freq: Four times a day (QID) | ORAL | Status: DC | PRN

## 2019-03-15 MED ORDER — ONDANSETRON HCL 4 MG/2ML IJ SOLN: 4.0000 mg | Freq: Four times a day (QID) | INTRAMUSCULAR | Status: DC | PRN

## 2019-03-15 MED ORDER — BUPROPION HCL ER (SR) 150 MG PO TB12
150.0000 mg | ORAL_TABLET | Freq: Two times a day (BID) | ORAL | Status: DC
  Administered 2019-03-15 – 2019-03-19 (×9): 150 mg via ORAL
  Filled 2019-03-15 (×9): qty 1

## 2019-03-15 MED ORDER — NICOTINE 14 MG/24HR TD PT24
14.0000 mg | MEDICATED_PATCH | Freq: Every day | TRANSDERMAL | Status: DC
  Administered 2019-03-15 – 2019-03-19 (×5): 14 mg via TRANSDERMAL
  Filled 2019-03-15 (×5): qty 1

## 2019-03-15 MED ORDER — GABAPENTIN 300 MG PO CAPS
600.0000 mg | ORAL_CAPSULE | Freq: Three times a day (TID) | ORAL | Status: DC
  Administered 2019-03-15 – 2019-03-19 (×13): 600 mg via ORAL
  Filled 2019-03-15 (×13): qty 2

## 2019-03-15 MED ORDER — METRONIDAZOLE IN NACL 5-0.79 MG/ML-% IV SOLN
500.0000 mg | Freq: Once | INTRAVENOUS | Status: AC
  Administered 2019-03-15: 500 mg via INTRAVENOUS
  Filled 2019-03-15: qty 100

## 2019-03-15 MED ORDER — VANCOMYCIN HCL IN DEXTROSE 1-5 GM/200ML-% IV SOLN
1000.0000 mg | Freq: Once | INTRAVENOUS | Status: DC
  Filled 2019-03-15: qty 200

## 2019-03-15 MED ORDER — VANCOMYCIN HCL 10 G IV SOLR
2500.0000 mg | Freq: Once | INTRAVENOUS | Status: AC
  Administered 2019-03-15: 2500 mg via INTRAVENOUS
  Filled 2019-03-15: qty 2500

## 2019-03-15 MED ORDER — ACETAMINOPHEN 650 MG RE SUPP: 650.0000 mg | Freq: Four times a day (QID) | RECTAL | Status: DC | PRN

## 2019-03-15 NOTE — Plan of Care (Signed)
  Problem: Education: Goal: Knowledge of General Education information will improve Description Including pain rating scale, medication(s)/side effects and non-pharmacologic comfort measures Outcome: Progressing   

## 2019-03-15 NOTE — Care Management (Signed)
This is a no charge note  Pending admission per Dr. Roxanne Mins  48 year old man with past medical history of PTSD, bipolar disorder, polysubstance abuse, alcohol abuse, prior IV drug user, CAD, myocardial infarction, depression, anxiety, who presents with left forearm pain since yesterday.  Also has fever and chills.  Bedside ultrasound did not show abscess.  CT scan showed cellulitis without abscess.  Patient received 1 dose of vancomycin, Flagyl and cefepime in ED.   Patient was found to have WBC 8.5, lactic acid 1.9, INR 1.0, electrolytes renal function okay, abnormal liver function (ALP 102, AST 124, ALT 179, total bilirubin 0.8), temperature 101.1, blood pressure 172/94, heart rate 74, oxygen saturation 99% on room air.  Chest x-ray negative.  I did not request bed at this moment since patient does not have leukocytosis. He has abnormal liver function.  Will need to rule out COVID-19 before deciding where to admit pt.     Ivor Costa, MD  [facility]   If 7PM-7AM, please contact night-coverage [email or web address] Password [email or web address] 03/15/2019, 6:40 AM

## 2019-03-15 NOTE — ED Notes (Signed)
Call [phone] visitor

## 2019-03-15 NOTE — Progress Notes (Signed)
Pt admitted into 6N13 from ED and oriented to room and dept.  Pt alert and oriented, showing me his left arm in the [facility] area which is red and hardened.  Pt also has multiple sites of small red scab lesions on his arms, mouth.

## 2019-03-15 NOTE — ED Notes (Signed)
ED TO INPATIENT HANDOFF REPORT  ED Nurse Name and Phone #: [email or web address] [phone]  Scott [name] Scott [name] L Ballin 48 y.o. male Room/Bed: 040C/040C  Code Status   Code Status: Full Code  Home/SNF/Other Home Patient oriented to: self, place, time and situation Is this baseline? Yes   Triage Complete: Triage complete  Chief Complaint sick  Triage Note Patient with area of left forearm/elbow with hotness, redness and pain.  Patient states that he does not do IV drug use any more.     Allergies Allergies  Allergen Reactions  . Benadryl [Diphenhydramine Hcl] Anaphylaxis and Other (See Comments)    Muscles lock up.  . Celecoxib Diarrhea, Rash and Other (See Comments)    Nose bleeds, bloating, migraines   . Diphenhydramine Anaphylaxis    Hard time breathing, convulsions,  . Prednisone Other (See Comments)    Severe Anger  . Risperidone And Related Other (See Comments)    Overly sedates  . Nsaids Diarrhea, Rash and Other (See Comments)    Nose bleeds, bloating, migraines  . Other Other (See Comments)    STEROIDS--caused anger,irritation  . Rofecoxib Diarrhea, Rash and Other (See Comments)    Nose bleeds, bloating, migraines  . Tolmetin Diarrhea, Rash and Other (See Comments)    Nose bleeds, bloating, migraines   . Trazodone And Nefazodone Other (See Comments)    headaches  . Ibuprofen Rash  . Latex Itching, Rash and Other (See Comments)    Skin cracks  . Paliperidone Palmitate Er Other (See Comments)    Overly sedates    Level of Care/Admitting Diagnosis ED Disposition    ED Disposition Condition Comment   Admit  [facility] [[id number]]  Level of Care: Med-Surg [16]  I expect the patient will be discharged within 24 hours: Yes  LOW acuity---Tx typically complete <24 hrs---ACUTE conditions typically can be evaluated <24 hours---LABS likely to return to acceptable levels <24 hours---IS near functional baseline---EXPECTED to return to current  living arrangement---NOT newly hypoxic: Meets criteria for 5C-Observation unit  Covid Evaluation: Asymptomatic Screening Protocol (No Symptoms)  Diagnosis: Cellulitis of arm, left [604540][[id number]]  Admitting Physician: [name], [name] [[id number]]  Attending Physician: [name], [name] [[id number]]  PT Class (Do Not Modify): Observation [104]  PT Acc Code (Do Not Modify): Observation [10022]       B Medical/Surgery History Past Medical History:  Diagnosis Date  . Accidental heroin overdose (HCC)   . Anxiety   . Back pain   . Bipolar 1 disorder (HCC)   . Chronic back pain   . Depression   . Drug-seeking behavior   . GERD (gastroesophageal reflux disease)   . Heroin abuse (HCC)   . History of ETOH abuse   . Myocardial infarction [facility](HCC)    Pt says that the doctor determined it was panic attacks  . Opioid dependence (HCC)   . PTSD (post-traumatic stress disorder)   . Seizures (HCC)    alcohol induced- pt reports sober for 6 months   Past Surgical History:  Procedure Laterality Date  . ABDOMINAL SURGERY    . APPENDECTOMY    . BACK SURGERY       A IV Location/Drains/Wounds Patient Lines/Drains/Airways Status   Active Line/Drains/Airways    Name:   Placement date:   Placement time:   Site:   Days:   Peripheral IV 03/15/19 Right Hand   03/15/19    0436    Hand   less than 1  Intake/Output Last 24 hours  Intake/Output Summary (Last 24 hours) at 03/15/2019 1106 Last data filed at 03/15/2019 0816 Gross per 24 hour  Intake 500 ml  Output -  Net 500 ml    Labs/Imaging Results for orders placed or performed during the hospital encounter of 03/15/19 (from the past 48 hour(Scott))  Urinalysis, Routine w reflex microscopic     Status: Abnormal   Collection Time: 03/15/19  3:05 AM  Result Value Ref Range   Color, Urine YELLOW YELLOW   APPearance CLOUDY (A) CLEAR   Specific Gravity, Urine 1.017 1.005 - 1.030   pH 7.0 5.0 - 8.0   Glucose, UA NEGATIVE NEGATIVE mg/dL   Hgb urine dipstick  MODERATE (A) NEGATIVE   Bilirubin Urine NEGATIVE NEGATIVE   Ketones, ur NEGATIVE NEGATIVE mg/dL   Protein, ur 30 (A) NEGATIVE mg/dL   Nitrite NEGATIVE NEGATIVE   Leukocytes,Ua NEGATIVE NEGATIVE   RBC / HPF 21-50 0 - 5 RBC/hpf   WBC, UA 0-5 0 - 5 WBC/hpf   Bacteria, UA NONE SEEN NONE SEEN   Mucus PRESENT    Amorphous Crystal PRESENT     Comment: Performed at [facility], [address] [place], [place] [address]  Culture, blood (Routine x 2)     Status: None (Preliminary result)   Collection Time: 03/15/19  3:25 AM   Specimen: BLOOD RIGHT HAND  Result Value Ref Range   Specimen Description BLOOD RIGHT HAND    Special Requests      BOTTLES DRAWN AEROBIC ONLY Blood Culture results may not be optimal due to an excessive volume of blood received in culture bottles Performed at [facility], [address] [place], [place] [address]    Culture PENDING    Report Status PENDING   Comprehensive metabolic panel     Status: Abnormal   Collection Time: 03/15/19  3:29 AM  Result Value Ref Range   Sodium 136 135 - 145 mmol/L   Potassium 3.9 3.5 - 5.1 mmol/L   Chloride 99 98 - 111 mmol/L   CO2 28 22 - 32 mmol/L   Glucose, Bld 105 (H) 70 - 99 mg/dL   BUN 7 6 - 20 mg/dL   Creatinine, Ser 0.980.75 0.61 - 1.24 mg/dL   Calcium 8.6 (L) 8.9 - 10.3 mg/dL   Total Protein 7.2 6.5 - 8.1 g/dL   Albumin 3.7 3.5 - 5.0 g/dL   AST 119124 (H) 15 - 41 U/L   ALT 179 (H) 0 - 44 U/L   Alkaline Phosphatase 102 38 - 126 U/L   Total Bilirubin 0.8 0.3 - 1.2 mg/dL   GFR calc non Af Amer >60 >60 mL/min   GFR calc Af Amer >60 >60 mL/min   Anion gap 9 5 - 15    Comment: Performed at [facility], [address] [place], [place] [address]  Lactic acid, plasma     Status: None   Collection Time: 03/15/19  3:29 AM  Result Value Ref Range   Lactic Acid, Venous 1.9 0.5 - 1.9 mmol/L    Comment: Performed at [facility], [address] [place], [place] [address]  CBC with Differential      Status: Abnormal   Collection Time: 03/15/19  3:29 AM  Result Value Ref Range   WBC 8.5 4.0 - 10.5 K/uL   RBC 5.56 4.22 - 5.81 MIL/uL   Hemoglobin 17.1 (H) 13.0 - 17.0 g/dL   HCT 30.848.9 65.739.0 -  52.0 %   MCV 87.9 80.0 - 100.0 fL   MCH 30.8 26.0 - 34.0 pg   MCHC 35.0 30.0 - 36.0 g/dL   RDW 16.112.6 09.611.5 - 04.515.5 %   Platelets 199 150 - 400 K/uL   nRBC 0.0 0.0 - 0.2 %   Neutrophils Relative % 67 %   Neutro Abs 5.7 1.7 - 7.7 K/uL   Lymphocytes Relative 20 %   Lymphs Abs 1.7 0.7 - 4.0 K/uL   Monocytes Relative 10 %   Monocytes Absolute 0.8 0.1 - 1.0 K/uL   Eosinophils Relative 2 %   Eosinophils Absolute 0.2 0.0 - 0.5 K/uL   Basophils Relative 1 %   Basophils Absolute 0.1 0.0 - 0.1 K/uL   Immature Granulocytes 0 %   Abs Immature Granulocytes 0.02 0.00 - 0.07 K/uL    Comment: Performed at [facility], [address] [place], [place] [address]  Protime-INR     Status: None   Collection Time: 03/15/19  3:29 AM  Result Value Ref Range   Prothrombin Time 13.1 11.4 - 15.2 seconds   INR 1.0 0.8 - 1.2    Comment: (NOTE) INR goal varies based on device and disease states. Performed at [facility], [address] [place], [place] [address]   Lactic acid, plasma     Status: Abnormal   Collection Time: 03/15/19  5:48 AM  Result Value Ref Range   Lactic Acid, Venous 0.4 (L) 0.5 - 1.9 mmol/L    Comment: Performed at [facility], [address] [place], [place] [address]  Ethanol     Status: None   Collection Time: 03/15/19  8:56 AM  Result Value Ref Range   Alcohol, Ethyl (B) <10 <10 mg/dL    Comment: (NOTE) Lowest detectable limit for serum alcohol is 10 mg/dL. For medical purposes only. Performed at [facility], [address] [place], [place] [address]    Ct Elbow Left Wo Contrast  Result Date: 03/15/2019 CLINICAL DATA:  Necrotizing fasciitis. EXAM: CT OF THE UPPER LEFT EXTREMITY WITHOUT CONTRAST TECHNIQUE: Multidetector CT imaging of the upper left  extremity was performed according to the standard protocol. COMPARISON:  None. FINDINGS: Bones/Joint/Cartilage Negative for bony erosion or joint effusion.  No fracture Ligaments Suboptimally assessed by CT. Muscles and Tendons Fat edema in the antecubital fossa.  No notable intramuscular edema. Soft tissues Subcutaneous reticulation and expansion in the ventral forearm without opaque foreign body, visible collection, or soft tissue gas. None of the subcutaneous veins appear focally hyperdense. IMPRESSION: Forearm cellulitis without soft tissue emphysema or visible abscess. Electronically Signed   By: Marnee SpringJonathon  Watts M.D.   On: 03/15/2019 05:51   Dg Chest Port 1 View  Result Date: 03/15/2019 CLINICAL DATA:  Fever EXAM: PORTABLE CHEST 1 VIEW COMPARISON:  12/16/2017 FINDINGS: Normal heart size and stable mediastinal contours. There is mild indentation of the left trachea at the thoracic inlet from thyroid nodule by 2015 chest CT. No acute infiltrate or edema. No effusion or pneumothorax. No acute osseous findings. IMPRESSION: No active disease. Electronically Signed   By: Marnee SpringJonathon  Watts M.D.   On: 03/15/2019 04:27    Pending Labs Unresulted Labs (From admission, onward)    Start     Ordered   03/16/19 0500  Basic metabolic panel  Tomorrow morning,   R     03/15/19 0824   03/16/19 0500  CBC  Tomorrow morning,   R  03/15/19 0824   03/15/19 1610  HIV antibody (Routine Testing)  Once,   STAT     03/15/19 0824   03/15/19 0802  Urine rapid drug screen (hosp performed)  Add-on,   AD     03/15/19 0802   03/15/19 0802  Urine culture  Add-on,   AD     03/15/19 0802   03/15/19 9604  SARS CORONAVIRUS 2 Nasal Swab Aptima Multi Swab  (Asymptomatic/Tier 2 Patients Labs)  ONCE - STAT,   STAT    Question Answer Comment  Is this test for diagnosis or screening Screening   Symptomatic for COVID-19 as defined by CDC No   Hospitalized for COVID-19 No   Admitted to ICU for COVID-19 No   Previously tested for  COVID-19 No   Resident in a congregate (group) care setting No   Employed in healthcare setting No      03/15/19 0638   03/15/19 0311  Culture, blood (Routine x 2)  BLOOD CULTURE X 2,   STAT     03/15/19 0312          Vitals/Pain Today'Scott Vitals   03/15/19 0945 03/15/19 1000 03/15/19 1010 03/15/19 1030  BP: 119/71 126/69  135/81  Pulse:      Resp:      Temp:      TempSrc:      SpO2:      Weight:      Height:      PainSc:   2      Isolation Precautions No active isolations  Medications Medications  buprenorphine (SUBUTEX) sublingual tablet 8 mg (8 mg Sublingual Given 03/15/19 1101)  buPROPion (WELLBUTRIN SR) 12 hr tablet 150 mg (150 mg Oral Given 03/15/19 1045)  gabapentin (NEURONTIN) capsule 600 mg (has no administration in time range)  enoxaparin (LOVENOX) injection 60 mg (has no administration in time range)  acetaminophen (TYLENOL) tablet 650 mg (has no administration in time range)    Or  acetaminophen (TYLENOL) suppository 650 mg (has no administration in time range)  ondansetron (ZOFRAN) tablet 4 mg (has no administration in time range)    Or  ondansetron (ZOFRAN) injection 4 mg (has no administration in time range)  ceFAZolin (ANCEF) IVPB 2g/100 mL premix (has no administration in time range)  nicotine (NICODERM CQ - dosed in mg/24 hours) patch 14 mg (has no administration in time range)  ceFEPIme (MAXIPIME) 2 g in sodium chloride 0.9 % 100 mL IVPB (0 g Intravenous Stopped 03/15/19 0510)  metroNIDAZOLE (FLAGYL) IVPB 500 mg (0 mg Intravenous Stopped 03/15/19 0545)  vancomycin (VANCOCIN) 2,500 mg in sodium chloride 0.9 % 500 mL IVPB (0 mg Intravenous Stopped 03/15/19 0816)    Mobility walks Low fall risk   Focused Assessments Neuro Assessment Handoff:  Swallow screen pass? Yes          Neuro Assessment:   Neuro Checks:      Last Documented NIHSS Modified Score:   Has TPA been given? No If patient is a Neuro Trauma and patient is going to OR before floor call  report to [place] nurse: [phone] or [phone]     R Recommendations: See Admitting Provider Note  Report given to:   Add

## 2019-03-15 NOTE — H&P (Signed)
History and Physical    Scott [name] ZOX:[id number] DOB: Mar 15, 1971 DOA: 03/15/2019  PCP:  Domenica Failoolidge, [facility] Consultants:  None Patient coming from:  Home - lives with wife and 3 sons; NOK:  Wife, [phone]  Chief Complaint: L forearm infection  HPI: Scott [name] is a 48 y.o. male with medical history significant of polysubstance abuse including IVDA; PTSD; and bipolar presenting with L forearm infection.  He has felt fatigued for a couple of days and having trouble urinating, like he had to push it out.  He was sitting at the computer and it felt like someone stuck a hot poker in his arm.  It swelled up. ?fever.  The arm problem was really noticeable yesterday and it happened quickly.  Strictly denies injecting drugs, reports clean since 7/19.  Also with sores on arms and legs and around mouth.  He reports occasional lapses with cocaine and ETOH but strictly denies IVDA.  He denies SI "for a long time."  He reports h/o "bad" Hep C and is planning to start treatment but has to have at least 3 months of abstaining from drugs and alcohol prior to qualifying.  He reports that his LFTs have been as high as 700s.   ED Course:  Carryover, per Dr. Clyde LundborgNiu:  48 year old man with past medical history of PTSD, bipolar disorder, polysubstance abuse, alcohol abuse, prior IV drug user, CAD, myocardial infarction, depression, anxiety, who presents with left forearm pain since yesterday. Also has fever and chills. Bedside ultrasound did not show abscess. CT scan showed cellulitis without abscess. Patient received 1 dose of vancomycin, Flagyl and cefepime in ED.  Patient was found to have WBC 8.5, lactic acid 1.9, INR 1.0, electrolytes renal function okay, abnormal liver function (ALP 102, AST 124, ALT 179, total bilirubin 0.8), temperature 101.1, blood pressure 172/94, heart rate 74, oxygen saturation 99% on room air. Chest x-ray negative. I did not request bed at this moment since patient  does not have leukocytosis. He has abnormal liver function. Will need to rule out COVID-19 before deciding where to admit pt.    Review of Systems: As per HPI; otherwise review of systems reviewed and negative.   Ambulatory Status:  Ambulates without assistance  Past Medical History:  Diagnosis Date  . Accidental heroin overdose (HCC)   . Anxiety   . Back pain   . Bipolar 1 disorder (HCC)   . Chronic back pain   . Depression   . Drug-seeking behavior   . GERD (gastroesophageal reflux disease)   . Heroin abuse (HCC)   . History of ETOH abuse   . Myocardial infarction [facility](HCC)    Pt says that the doctor determined it was panic attacks  . Opioid dependence (HCC)   . PTSD (post-traumatic stress disorder)   . Seizures (HCC)    alcohol induced- pt reports sober for 6 months    Past Surgical History:  Procedure Laterality Date  . ABDOMINAL SURGERY    . APPENDECTOMY    . BACK SURGERY      Social History   Socioeconomic History  . Marital status: Married    Spouse name: Not on file  . Number of children: Not on file  . Years of education: Not on file  . Highest education level: Not on file  Occupational History  . Occupation: disability  Social Needs  . Financial resource strain: Not on file  . Food insecurity    Worry: Not on file  Inability: Not on file  . Transportation needs    Medical: Not on file    Non-medical: Not on file  Tobacco Use  . Smoking status: Current Every Day Smoker    Packs/day: 1.50    Types: Cigarettes  . Smokeless tobacco: Never Used  . Tobacco comment: vaping, wenaed down to 5 mg; would like a patch  Substance and Sexual Activity  . Alcohol use: Not Currently    Comment: Pt reports sober for 6 months  . Drug use: Yes    Types: Methamphetamines, Marijuana, Cocaine    Comment: cocaine about about a month ago  . Sexual activity: Yes    Birth control/protection: None    Comment: heroin  Lifestyle  . Physical activity    Days per week:  Not on file    Minutes per session: Not on file  . Stress: Not on file  Relationships  . Social Herbalist on phone: Not on file    Gets together: Not on file    Attends religious service: Not on file    Active member of club or organization: Not on file    Attends meetings of clubs or organizations: Not on file    Relationship status: Not on file  . Intimate partner violence    Fear of current or ex partner: Not on file    Emotionally abused: Not on file    Physically abused: Not on file    Forced sexual activity: Not on file  Other Topics Concern  . Not on file  Social History Narrative  . Not on file    Allergies  Allergen Reactions  . Benadryl [Diphenhydramine Hcl] Anaphylaxis and Other (See Comments)    Muscles lock up.  . Celecoxib Diarrhea, Rash and Other (See Comments)    Nose bleeds, bloating, migraines   . Diphenhydramine Anaphylaxis    Hard time breathing, convulsions,  . Prednisone Other (See Comments)    Severe Anger  . Risperidone And Related Other (See Comments)    Overly sedates  . Nsaids Diarrhea, Rash and Other (See Comments)    Nose bleeds, bloating, migraines  . Other Other (See Comments)    STEROIDS--caused anger,irritation  . Rofecoxib Diarrhea, Rash and Other (See Comments)    Nose bleeds, bloating, migraines  . Tolmetin Diarrhea, Rash and Other (See Comments)    Nose bleeds, bloating, migraines   . Trazodone And Nefazodone Other (See Comments)    headaches  . Ibuprofen Rash  . Latex Itching, Rash and Other (See Comments)    Skin cracks  . Paliperidone Palmitate Er Other (See Comments)    Overly sedates    Family History  Problem Relation Age of Onset  . Hypertension Mother   . CAD Father   . COPD Father   . Stroke Father   . Testicular cancer Brother     Prior to Admission medications   Medication Sig Start Date End Date Taking? Authorizing Provider  Buprenorphine HCl-Naloxone HCl (SUBOXONE) 8-2 MG FILM Place 1 Film  under the tongue 3 (three) times daily.   Yes [provider]  buPROPion (WELLBUTRIN SR) 150 MG 12 hr tablet Take 150 mg by mouth 2 (two) times daily.   Yes [provider]  butalbital-acetaminophen-caffeine (FIORICET) 50-325-40 MG tablet Take 1 tablet by mouth 2 (two) times daily as needed for headache.   Yes [provider]  gabapentin (NEURONTIN) 600 MG tablet Take 1 tablet (600 mg total) by mouth 3 (three)  times daily. 09/01/18  Yes Malvin JohnsFarah, Brian, MD  phentermine (ADIPEX-P) 37.5 MG tablet Take 37.5 mg by mouth daily before breakfast.   Yes [provider]  busPIRone (BUSPAR) 10 MG tablet Take 1 tablet (10 mg total) by mouth 3 (three) times daily. Patient not taking: Reported on 03/15/2019 09/01/18   Malvin JohnsFarah, Brian, MD  citalopram (CELEXA) 20 MG tablet 1 a day Patient not taking: Reported on 03/15/2019 09/01/18   Malvin JohnsFarah, Brian, MD  OXcarbazepine (TRILEPTAL) 150 MG tablet Take 1 tablet (150 mg total) by mouth 2 (two) times daily. Patient not taking: Reported on 03/15/2019 09/01/18   Malvin JohnsFarah, Brian, MD    Physical Exam: Vitals:   03/15/19 0345 03/15/19 0400 03/15/19 0748 03/15/19 0749  BP: (!) 161/100 (!) 160/87 125/73   Pulse:   92 92  Resp: 19 16 11 13   Temp:      TempSrc:      SpO2:   96% 96%  Weight:  131.5 kg    Height:  6' (1.829 m)       . General:  Appears calm and comfortable and is NAD . Eyes:  PERRL, EOMI, normal lids, iris . ENT:  grossly normal hearing, lips & tongue, mmm; appropriate dentition . Neck:  no LAD, masses or thyromegaly . Cardiovascular:  RRR, no m/r/g. No LE edema.  Marland Kitchen. Respiratory:   CTA bilaterally with no wheezes/rales/rhonchi.  Normal respiratory effort. . Abdomen:  soft, NT, ND, NABS . Back:   normal alignment, no CVAT . Skin:  R forearm cellulitis starting in the antecubital fossa and extending to the mid-forearm with surrounding edema; there is some extension of the erythema to the lateral elbow region.  There is evidence of  prior incision without obvious track marks.      He also has scattered pustular lesions on his R arm and face        . Musculoskeletal:  grossly normal tone BUE/BLE, good ROM, no bony abnormality . Psychiatric:  grossly normal mood and affect, speech fluent and appropriate, AOx3 . Neurologic:  CN 2-12 grossly intact, moves all extremities in coordinated fashion    Radiological Exams on Admission: Ct Elbow Left Wo Contrast  Result Date: 03/15/2019 CLINICAL DATA:  Necrotizing fasciitis. EXAM: CT OF THE UPPER LEFT EXTREMITY WITHOUT CONTRAST TECHNIQUE: Multidetector CT imaging of the upper left extremity was performed according to the standard protocol. COMPARISON:  None. FINDINGS: Bones/Joint/Cartilage Negative for bony erosion or joint effusion.  No fracture Ligaments Suboptimally assessed by CT. Muscles and Tendons Fat edema in the antecubital fossa.  No notable intramuscular edema. Soft tissues Subcutaneous reticulation and expansion in the ventral forearm without opaque foreign body, visible collection, or soft tissue gas. None of the subcutaneous veins appear focally hyperdense. IMPRESSION: Forearm cellulitis without soft tissue emphysema or visible abscess. Electronically Signed   By: Marnee SpringJonathon  Watts M.D.   On: 03/15/2019 05:51   Dg Chest Port 1 View  Result Date: 03/15/2019 CLINICAL DATA:  Fever EXAM: PORTABLE CHEST 1 VIEW COMPARISON:  12/16/2017 FINDINGS: Normal heart size and stable mediastinal contours. There is mild indentation of the left trachea at the thoracic inlet from thyroid nodule by 2015 chest CT. No acute infiltrate or edema. No effusion or pneumothorax. No acute osseous findings. IMPRESSION: No active disease. Electronically Signed   By: Marnee SpringJonathon  Watts M.D.   On: 03/15/2019 04:27    EKG: Independently reviewed.  NSR with rate 71; nonspecific ST changes with no evidence of acute ischemia; NSCSLT   Labs  on Admission: I have personally reviewed the available labs and  imaging studies at the time of the admission.  Pertinent labs:   Glucose 105 AST 124/ALT 179 Lactate 1.9, 0.4 WBC 8.5 COVID pending Blood cultures pending UA: moderate Hgb, 30 protein  Assessment/Plan Principal Problem:   Cellulitis of arm, left Active Problems:   Elevated liver enzymes   MDD (major depressive disorder), recurrent episode, severe (HCC)   Folliculitis   Polysubstance abuse (HCC)   Tobacco dependence   Cellulitis, folliculitis -Patient with h/o IVDA, denies recent use -Currently with erythema to elbow and extending distally to mid-forearm -Careful inspection of the skin does not show skin breaks or obvious track marks -Normal WBC count -He was given Vanc, Cefepime, and Flgayl  in the ER -By the cellulitis order set, this would actually be considered mild non-purulent cellulitis and PO antibiotic treatment is reasonable -Will not give Clindamycin due to rapidly developing resistance for this antibiotic nationally -Will start IV Ancef for now -If improved tomorrow, he likely can be discharged; if not, he will likely need broadening of his antibiotics   Polysubstance abuse -Patient with long-standing issues with substances -He is currently on Suboxone, which has been continued -He reports use of cocaine and ETOH within the last month -UDS and ETOH levels are pending -Ongoing efforts at cessation should be encouraged -It may be beneficial to d/c medications which may be habit-forming, including Fioricet, Neurontin, and Phentermine  Elevated LFTs -LFTs on 04/22/18 were 165/103 and so generally appear to be stable today -LFTs on 04/14/18 were 204/160; he was recommended to f/u with GI Dr. Elpidio Anis -During psychiatric admission in 7/19, AST was 315, ALT 346, Hep C Ab positive >11.0 -It is not clear that he has actually followed up about this issue -Will need outpatient f/u  MDD -Continue home Wellbutrin -Denies SI -He was last hospitalized for SI in  3/20  Morbid obesity -BMI 39 -Hold phentermine -Suggest bariatric medicine and/or surgery referral as an outpatient  Tobacco dependence -Encourage cessation.   -This was discussed with the patient and should be reviewed on an ongoing basis.   -Patch ordered at patient request.   Note: This patient has been tested and is pending for the novel coronavirus COVID-19.  DVT prophylaxis:  Lovenox  Code Status:  Full - confirmed with patient Family Communication: None present Disposition Plan:  Home once clinically improved Consults called: None  Admission status: It is my clinical opinion that referral for OBSERVATION is reasonable and necessary in this patient based on the above information provided. The aforementioned taken together are felt to place the patient at high risk for further clinical deterioration. However it is anticipated that the patient may be medically stable for discharge from the hospital within 24 to 48 hours.    Jonah Blue MD [facility]   How to contact the [facility] Attending or Consulting provider 7A - 7P or covering provider during after hours 7P -7A, for this patient?  1. Check the care team in [facility] and look for a) attending/consulting [facility] provider listed and b) the [facility] team listed 2. Log into [email or web address] and use [place]'s universal password to access. If you do not have the password, please contact the hospital operator. 3. Locate the [facility] provider you are looking for under [facility] and page to a number that you can be directly reached. 4. If you still have difficulty reaching the provider, please page the [facility] (Director on Call) for the Hospitalists listed on amion  for assistance.   03/15/2019, 8:30 AM

## 2019-03-15 NOTE — ED Triage Notes (Signed)
Patient with area of left forearm/elbow with hotness, redness and pain.  Patient states that he does not do IV drug use any more.

## 2019-03-15 NOTE — Progress Notes (Signed)
Verified with Dr. Lorin Mercy that she was OK with the SARS test being the level 2 test vs. The rapid test.  Results are not back yet.  Lab called and they stated his test was a level 2 and these results would take longer.

## 2019-03-15 NOTE — ED Provider Notes (Signed)
[place] EMERGENCY DEPARTMENT Provider Note   CSN: [id number] Arrival date & time: 03/15/19  0256    History   Chief Complaint Chief Complaint  Patient presents with  . Abscess    HPI Scott [name] is a 48 y.o. male.   The history is provided by the patient.  He has history of bipolar disorder, prior IV drug abuse and comes in because of an abscess on his left forearm.  He states he has had he is several times before and has required incision and drainage.  This just started within the past 12 hours and has progressed rapidly.  He is complaining of pain which he rates at 8/10 and is worse with movement or palpation.  The area is just distal to his elbow.  He has had subjective fever as well as chills and sweats.  Past Medical History:  Diagnosis Date  . Accidental heroin overdose ([place])   . Anxiety   . Back pain   . Bipolar 1 disorder ([place])   . Chronic back pain   . Depression   . Drug-seeking behavior   . GERD (gastroesophageal reflux disease)   . Heroin abuse ([place])   . History of ETOH abuse   . Myocardial infarction [facility])    Pt says that the doctor determined it was panic attacks  . Opioid dependence ([place])   . PTSD (post-traumatic stress disorder)   . Seizures ([place])    alcohol induced- pt reports sober for 6 months    Patient Active Problem List   Diagnosis Date Noted  . Severe recurrent major depression without psychotic features ([place]) 08/28/2018  . MDD (major depressive disorder), recurrent episode, severe ([place]) 05/06/2018  . MDD (major depressive disorder) 05/06/2018  . Elevated liver enzymes 03/13/2018  . MDD (major depressive disorder), recurrent severe, without psychosis ([place]) 03/06/2018  . MDD (major depressive disorder), severe ([place]) 12/16/2017  . Schizoaffective disorder ([place]) 06/07/2017  . Alcohol dependence with alcohol-induced mood disorder ([place]) 07/24/2015  . Alcohol withdrawal ([place]) 07/24/2015  . Cocaine use disorder, mild,  abuse ([place]) 07/24/2015  . Substance induced mood disorder ([place]) 07/22/2015  . Drug-seeking behavior 02/16/2015  . PTSD (post-traumatic stress disorder) 12/26/2014  . Opioid type dependence, continuous ([place]) 12/26/2014  . Major depressive disorder, recurrent episode, moderate ([place])   . Chest pain at rest 10/22/2013  . Chronic pain syndrome 10/22/2013  . Seizure disorder ([place]) 10/22/2013  . Chest pain 10/21/2013    Past Surgical History:  Procedure Laterality Date  . ABDOMINAL SURGERY    . APPENDECTOMY    . BACK SURGERY          Home Medications    Prior to Admission medications   Medication Sig Start Date End Date Taking? Authorizing Provider  busPIRone (BUSPAR) 10 MG tablet Take 1 tablet (10 mg total) by mouth 3 (three) times daily. 09/01/18   Johnn Hai, MD  citalopram (CELEXA) 20 MG tablet 1 a day 09/01/18   Johnn Hai, MD  gabapentin (NEURONTIN) 600 MG tablet Take 1 tablet (600 mg total) by mouth 3 (three) times daily. 09/01/18   Johnn Hai, MD  OXcarbazepine (TRILEPTAL) 150 MG tablet Take 1 tablet (150 mg total) by mouth 2 (two) times daily. 09/01/18   Johnn Hai, MD    Family History Family History  Problem Relation Age of Onset  . Hypertension Mother   . CAD Father   . COPD Father   . Stroke Father   . Testicular cancer Brother  Social History Social History   Tobacco Use  . Smoking status: Current Every Day Smoker    Packs/day: 1.50    Types: Cigarettes  . Smokeless tobacco: Never Used  Substance Use Topics  . Alcohol use: Not Currently    Comment: Pt reports sober for 6 months  . Drug use: Yes    Types: Methamphetamines, Marijuana, IV, Cocaine    Comment: Pt says not sure why positive for amphetamines     Allergies   Benadryl [diphenhydramine hcl], Celecoxib, Diphenhydramine, Prednisone, Risperidone and related, Nsaids, Other, Rofecoxib, Tolmetin, Trazodone and nefazodone, Ibuprofen, Latex, and Paliperidone palmitate er   Review of  Systems Review of Systems  All other systems reviewed and are negative.    Physical Exam Updated Vital Signs BP (!) 167/96 (BP Location: Right Arm)   Pulse 77   Temp 97.6 F (36.4 C) (Oral)   Resp 16   SpO2 100%   Physical Exam Vitals signs and nursing note reviewed.    Morbidly obese 48 year old male, resting comfortably and in no acute distress. Vital signs are significant for elevated blood pressure, temperature, respiratory rate. Oxygen saturation is 99%, which is normal. Head is normocephalic and atraumatic. PERRLA, EOMI. Oropharynx is clear. Neck is nontender and supple without adenopathy or JVD. Back is nontender and there is no CVA tenderness. Lungs are clear without rales, wheezes, or rhonchi. Chest is nontender. Heart has regular rate and rhythm without murmur. Abdomen is soft, flat, nontender without masses or hepatosplenomegaly and peristalsis is normoactive. Extremities: Slight swelling of the proximal left forearm with marked tenderness and a slight area of induration on the flexor surface toward the radial side.  No definite fluctuance.  There is slight warmth but no erythema.  Left forearm circumference is 1 cm greater than right forearm circumference. Skin is warm and dry without rash. Neurologic: Mental status is normal, cranial nerves are intact, there are no motor or sensory deficits.  ED Treatments / Results  Labs (all labs ordered are listed, but only abnormal results are displayed) Labs Reviewed  COMPREHENSIVE METABOLIC PANEL - Abnormal; Notable for the following components:      Result Value   Glucose, Bld 105 (*)    Calcium 8.6 (*)    AST 124 (*)    ALT 179 (*)    All other components within normal limits  CBC WITH DIFFERENTIAL/PLATELET - Abnormal; Notable for the following components:   Hemoglobin 17.1 (*)    All other components within normal limits  URINALYSIS, ROUTINE W REFLEX MICROSCOPIC - Abnormal; Notable for the following components:    APPearance CLOUDY (*)    Hgb urine dipstick MODERATE (*)    Protein, ur 30 (*)    All other components within normal limits  CULTURE, BLOOD (ROUTINE X 2)  CULTURE, BLOOD (ROUTINE X 2)  URINE CULTURE  SARS CORONAVIRUS 2  LACTIC ACID, PLASMA  PROTIME-INR  LACTIC ACID, PLASMA    EKG EKG Interpretation  Date/Time:  Thursday March 15 2019 04:11:33 EDT Ventricular Rate:  71 PR Interval:    QRS Duration: 110 QT Interval:  396 QTC Calculation: 431 R Axis:   141 Text Interpretation:  Sinus rhythm Right axis deviation Low voltage, extremity and precordial leads Poor R wave progression When compared with ECG of 08/27/2018, No significant change was found Confirmed by Dione BoozeGlick, Tashina Credit ([id number]) on 03/15/2019 5:49:18 AM   Radiology Ct Elbow Left Wo Contrast  Result Date: 03/15/2019 CLINICAL DATA:  Necrotizing fasciitis. EXAM: CT OF THE  UPPER LEFT EXTREMITY WITHOUT CONTRAST TECHNIQUE: Multidetector CT imaging of the upper left extremity was performed according to the standard protocol. COMPARISON:  None. FINDINGS: Bones/Joint/Cartilage Negative for bony erosion or joint effusion.  No fracture Ligaments Suboptimally assessed by CT. Muscles and Tendons Fat edema in the antecubital fossa.  No notable intramuscular edema. Soft tissues Subcutaneous reticulation and expansion in the ventral forearm without opaque foreign body, visible collection, or soft tissue gas. None of the subcutaneous veins appear focally hyperdense. IMPRESSION: Forearm cellulitis without soft tissue emphysema or visible abscess. Electronically Signed   By: Marnee SpringJonathon  Watts M.D.   On: 03/15/2019 05:51   Dg Chest Port 1 View  Result Date: 03/15/2019 CLINICAL DATA:  Fever EXAM: PORTABLE CHEST 1 VIEW COMPARISON:  12/16/2017 FINDINGS: Normal heart size and stable mediastinal contours. There is mild indentation of the left trachea at the thoracic inlet from thyroid nodule by 2015 chest CT. No acute infiltrate or edema. No effusion or  pneumothorax. No acute osseous findings. IMPRESSION: No active disease. Electronically Signed   By: Marnee SpringJonathon  Watts M.D.   On: 03/15/2019 04:27    Procedures Ultrasound ED Soft Tissue  Date/Time: 03/15/2019 4:03 AM Performed by: Dione BoozeGlick, Jahking Lesser, MD Authorized by: Dione BoozeGlick, Kemari Mares, MD   Procedure details:    Indications: evaluate for cellulitis     Transverse view:  Visualized   Longitudinal view:  Visualized   Images: archived   Location:    Location: upper extremity     Side:  Left Findings:     no abscess present    cellulitis present   Medications Ordered in ED Medications  vancomycin (VANCOCIN) 2,500 mg in sodium chloride 0.9 % 500 mL IVPB (2,500 mg Intravenous New Bag/Given 03/15/19 0517)  ceFEPIme (MAXIPIME) 2 g in sodium chloride 0.9 % 100 mL IVPB (0 g Intravenous Stopped 03/15/19 0510)  metroNIDAZOLE (FLAGYL) IVPB 500 mg (0 mg Intravenous Stopped 03/15/19 0545)     Initial Impression / Assessment and Plan / ED Course  I have reviewed the triage vital signs and the nursing notes.  Pertinent labs & imaging results that were available during my care of the patient were reviewed by me and considered in my medical decision making (see chart for details).  Infection in the left forearm without clear evidence of abscess on physical exam.  Limited bedside ultrasound was done and no abscess cavity was identified.  I am concerned about the rapidity onset.  Blood cultures are obtained and he will be sent for CT scan to evaluate for possible necrotizing fasciitis.  He is started on empiric antibiotics.  Old records are reviewed confirming to prior ED visits for abscess incision and drainage.  CT scan shows evidence of cellulitis but no evidence of abscess or necrotizing fasciitis.  Labs show normal WBC, mild elevation of transaminases which is not significantly changed from baseline.  Urine is significant for microscopic hematuria which has been present in the past.  Chest x-ray shows no acute  process.  Case is discussed with Dr.Niu of [facility], who agrees to admit the patient.  Final Clinical Impressions(s) / ED Diagnoses   Final diagnoses:  Cellulitis of left forearm  Elevated transaminase level    ED Discharge Orders    None       Dione BoozeGlick, Izzac Rockett, MD 03/15/19 [phone]

## 2019-03-15 NOTE — ED Notes (Signed)
Lunch tray ordered 

## 2019-03-15 NOTE — ED Notes (Signed)
Admitting provider at bedside.

## 2019-03-16 DIAGNOSIS — Z91040 Latex allergy status: Secondary | ICD-10-CM | POA: Diagnosis not present

## 2019-03-16 DIAGNOSIS — I252 Old myocardial infarction: Secondary | ICD-10-CM | POA: Diagnosis not present

## 2019-03-16 DIAGNOSIS — K219 Gastro-esophageal reflux disease without esophagitis: Secondary | ICD-10-CM | POA: Diagnosis present

## 2019-03-16 DIAGNOSIS — Z886 Allergy status to analgesic agent status: Secondary | ICD-10-CM | POA: Diagnosis not present

## 2019-03-16 DIAGNOSIS — F1910 Other psychoactive substance abuse, uncomplicated: Secondary | ICD-10-CM | POA: Diagnosis not present

## 2019-03-16 DIAGNOSIS — Z6841 Body Mass Index (BMI) 40.0 and over, adult: Secondary | ICD-10-CM | POA: Diagnosis not present

## 2019-03-16 DIAGNOSIS — G8929 Other chronic pain: Secondary | ICD-10-CM | POA: Diagnosis present

## 2019-03-16 DIAGNOSIS — Z79899 Other long term (current) drug therapy: Secondary | ICD-10-CM | POA: Diagnosis not present

## 2019-03-16 DIAGNOSIS — Z888 Allergy status to other drugs, medicaments and biological substances status: Secondary | ICD-10-CM | POA: Diagnosis not present

## 2019-03-16 DIAGNOSIS — L03114 Cellulitis of left upper limb: Secondary | ICD-10-CM | POA: Diagnosis present

## 2019-03-16 DIAGNOSIS — F332 Major depressive disorder, recurrent severe without psychotic features: Secondary | ICD-10-CM | POA: Diagnosis not present

## 2019-03-16 DIAGNOSIS — M549 Dorsalgia, unspecified: Secondary | ICD-10-CM | POA: Diagnosis present

## 2019-03-16 DIAGNOSIS — R569 Unspecified convulsions: Secondary | ICD-10-CM | POA: Diagnosis present

## 2019-03-16 DIAGNOSIS — B1920 Unspecified viral hepatitis C without hepatic coma: Secondary | ICD-10-CM | POA: Diagnosis present

## 2019-03-16 DIAGNOSIS — F319 Bipolar disorder, unspecified: Secondary | ICD-10-CM | POA: Diagnosis present

## 2019-03-16 DIAGNOSIS — Z20828 Contact with and (suspected) exposure to other viral communicable diseases: Secondary | ICD-10-CM | POA: Diagnosis present

## 2019-03-16 DIAGNOSIS — F17200 Nicotine dependence, unspecified, uncomplicated: Secondary | ICD-10-CM | POA: Diagnosis not present

## 2019-03-16 DIAGNOSIS — F17210 Nicotine dependence, cigarettes, uncomplicated: Secondary | ICD-10-CM | POA: Diagnosis present

## 2019-03-16 DIAGNOSIS — L739 Follicular disorder, unspecified: Secondary | ICD-10-CM | POA: Diagnosis present

## 2019-03-16 DIAGNOSIS — F1120 Opioid dependence, uncomplicated: Secondary | ICD-10-CM | POA: Diagnosis present

## 2019-03-16 DIAGNOSIS — R748 Abnormal levels of other serum enzymes: Secondary | ICD-10-CM | POA: Diagnosis not present

## 2019-03-16 DIAGNOSIS — F419 Anxiety disorder, unspecified: Secondary | ICD-10-CM | POA: Diagnosis present

## 2019-03-16 LAB — CBC
HCT: 45 % (ref 39.0–52.0)
Hemoglobin: 15.6 g/dL (ref 13.0–17.0)
MCH: 31 pg (ref 26.0–34.0)
MCHC: 34.7 g/dL (ref 30.0–36.0)
MCV: 89.3 fL (ref 80.0–100.0)
Platelets: 168 10*3/uL (ref 150–400)
RBC: 5.04 MIL/uL (ref 4.22–5.81)
RDW: 13.2 % (ref 11.5–15.5)
WBC: 9.4 10*3/uL (ref 4.0–10.5)
nRBC: 0 % (ref 0.0–0.2)

## 2019-03-16 LAB — BASIC METABOLIC PANEL
Anion gap: 10 (ref 5–15)
BUN: 11 mg/dL (ref 6–20)
CO2: 25 mmol/L (ref 22–32)
Calcium: 8.3 mg/dL — ABNORMAL LOW (ref 8.9–10.3)
Chloride: 99 mmol/L (ref 98–111)
Creatinine, Ser: 0.78 mg/dL (ref 0.61–1.24)
GFR calc Af Amer: >60 mL/min (ref >60)
GFR calc non Af Amer: >60 mL/min (ref >60)
Glucose, Bld: 110 mg/dL — ABNORMAL HIGH (ref 70–99)
Potassium: 4 mmol/L (ref 3.5–5.1)
Sodium: 134 mmol/L — ABNORMAL LOW (ref 135–145)

## 2019-03-16 LAB — URINE CULTURE

## 2019-03-16 LAB — HIV ANTIBODY (ROUTINE TESTING W REFLEX): HIV Screen 4th Generation wRfx: NONREACTIVE

## 2019-03-16 MED ORDER — SODIUM CHLORIDE 0.9% FLUSH: 10.0000 mL | INTRAVENOUS | Status: DC | PRN

## 2019-03-16 MED ORDER — VANCOMYCIN HCL 10 G IV SOLR
1500.0000 mg | Freq: Two times a day (BID) | INTRAVENOUS | Status: DC
  Administered 2019-03-16 – 2019-03-18 (×6): 1500 mg via INTRAVENOUS
  Filled 2019-03-16 (×8): qty 1500

## 2019-03-16 MED ORDER — PIPERACILLIN-TAZOBACTAM 3.375 G IVPB
3.3750 g | Freq: Three times a day (TID) | INTRAVENOUS | Status: DC
  Administered 2019-03-16 – 2019-03-19 (×9): 3.375 g via INTRAVENOUS
  Filled 2019-03-16 (×9): qty 50

## 2019-03-16 MED ORDER — PHENTERMINE HCL 37.5 MG PO TABS: 37.5000 mg | ORAL_TABLET | Freq: Every day | ORAL | Status: DC

## 2019-03-16 NOTE — Progress Notes (Signed)
PROGRESS NOTE  Jennette Kettleelson L Mcbee WUJ:[id number] DOB: 06-16-1971 DOA: 03/15/2019 PCP: System, Pcp Not In  HPI/Recap of past 24 hours: HPI from Dr Ernestene MentionYates Candon L Furr is a 48 y.o. male with medical history significant of polysubstance abuse including IVDA; PTSD; and bipolar presenting with L forearm infection. He was sitting at the computer and it felt like someone stuck a hot poker in his arm.  It swelled up. ?fever.  The arm problem was really noticeable yesterday and it happened quickly.  Strictly denies injecting drugs, reports clean since 7/19.  Also with sores on arms and legs and around mouth. He reports occasional lapses with cocaine and ETOH but strictly denies IVDA.  He denies SI "for a long time."  He reports h/o "bad" Hep C and is planning to start treatment but has to have at least 3 months of abstaining from drugs and alcohol prior to qualifying.  Patient admitted for further management   Today, patient reports worsening of the left forearm swelling, worsening erythema and pain.  Patient reports overall feeling unwell, denies any nausea/vomiting, further fever/chills, chest pain, shortness of breath, abdominal pain, diarrhea.  Assessment/Plan: Principal Problem:   Cellulitis of arm, left Active Problems:   Elevated liver enzymes   MDD (major depressive disorder), recurrent episode, severe (HCC)   Folliculitis   Polysubstance abuse (HCC)   Tobacco dependence  Left forearm cellulitis Worsening swelling, erythema and pain noted today Had a temp of 101.1 on 8/6, currently afebrile with no leukocytosis BC x2 pending Lactic acid is 0.4 CT forearm showed cellulitis without soft tissue emphysema or visible abscess Due to worsening cellulitis, will switch patient back to Vanco and Zosyn and monitor closely for de-escalation If worsening, may consult ID Monitor closely  Elevated LFTs Stable Reports history of hep C Plan to start treatment once 3 months sober  Polysubstance  abuse UDS positive for cocaine, barbiturates, amphetamines Reports no cocaine use for a while, takes Fioricet and phentermine Continue Suboxone Encouraged patient to continue to remain sober  Tobacco abuse Advised to quit Nicotine patch offered  Depression Denies any suicidal/homicidal ideation Continue Wellbutrin  Morbid obesity BMI 42.04 Continue phentermine       Malnutrition Type:      Malnutrition Characteristics:      Nutrition Interventions:       Estimated body mass index is 42.04 kg/m as calculated from the following:   Height as of this encounter: 6' (1.829 m).   Weight as of this encounter: 140.6 kg.     Code Status: Full  Family Communication: None at bedside  Disposition Plan: Likely home   Consultants:  None  Procedures:  None  Antimicrobials:  Vancomycin  Zosyn  DVT prophylaxis: Lovenox   Objective: Vitals:   03/15/19 1155 03/15/19 1556 03/15/19 1941 03/16/19 0629  BP: (!) 155/87 136/88 133/84 (!) 158/82  Pulse: 85 88 77 65  Resp: 18 18 17 18   Temp: 99.5 F (37.5 C) 98 F (36.7 C) 98.2 F (36.8 C) 98.5 F (36.9 C)  TempSrc: Axillary Oral Oral Oral  SpO2: 96% 97% 97% 97%  Weight: (!) 140.6 kg     Height: 6' (1.829 m)       Intake/Output Summary (Last 24 hours) at 03/16/2019 1223 Last data filed at 03/16/2019 0928 Gross per 24 hour  Intake 1077 ml  Output -  Net 1077 ml   Filed Weights   03/15/19 0400 03/15/19 1155  Weight: 131.5 kg (!) 140.6 kg  Exam:  General: NAD   Cardiovascular: S1, S2 present  Respiratory: CTAB  Abdomen: Soft, nontender, nondistended, bowel sounds present  Musculoskeletal: No bilateral pedal edema noted  Skin:  Right forearm cellulitis, with edema, erythema extending to the elbow region, no track marks noted.  Patient also has scattered pustular lesions on his right arm and face  Psychiatry: Normal mood     Data Reviewed: CBC: Recent Labs  Lab 03/15/19 0329  03/16/19 0418  WBC 8.5 9.4  NEUTROABS 5.7  --   HGB 17.1* 15.6  HCT 48.9 45.0  MCV 87.9 89.3  PLT 199 168   Basic Metabolic Panel: Recent Labs  Lab 03/15/19 0329 03/16/19 0418  NA 136 134*  K 3.9 4.0  CL 99 99  CO2 28 25  GLUCOSE 105* 110*  BUN 7 11  CREATININE 0.75 0.78  CALCIUM 8.6* 8.3*   GFR: Estimated Creatinine Clearance: 166 mL/min (by C-G formula based on SCr of 0.78 mg/dL). Liver Function Tests: Recent Labs  Lab 03/15/19 0329  AST 124*  ALT 179*  ALKPHOS 102  BILITOT 0.8  PROT 7.2  ALBUMIN 3.7   No results for input(s): LIPASE, AMYLASE in the last 168 hours. No results for input(s): AMMONIA in the last 168 hours. Coagulation Profile: Recent Labs  Lab 03/15/19 0329  INR 1.0   Cardiac Enzymes: No results for input(s): CKTOTAL, CKMB, CKMBINDEX, TROPONINI in the last 168 hours. BNP (last 3 results) No results for input(s): PROBNP in the last 8760 hours. HbA1C: No results for input(s): HGBA1C in the last 72 hours. CBG: No results for input(s): GLUCAP in the last 168 hours. Lipid Profile: No results for input(s): CHOL, HDL, LDLCALC, TRIG, CHOLHDL, LDLDIRECT in the last 72 hours. Thyroid Function Tests: No results for input(s): TSH, T4TOTAL, FREET4, T3FREE, THYROIDAB in the last 72 hours. Anemia Panel: No results for input(s): VITAMINB12, FOLATE, FERRITIN, TIBC, IRON, RETICCTPCT in the last 72 hours. Urine analysis:    Component Value Date/Time   COLORURINE YELLOW 03/15/2019 0305   APPEARANCEUR CLOUDY (A) 03/15/2019 0305   LABSPEC 1.017 03/15/2019 0305   PHURINE 7.0 03/15/2019 0305   GLUCOSEU NEGATIVE 03/15/2019 0305   HGBUR MODERATE (A) 03/15/2019 0305   BILIRUBINUR NEGATIVE 03/15/2019 0305   KETONESUR NEGATIVE 03/15/2019 0305   PROTEINUR 30 (A) 03/15/2019 0305   UROBILINOGEN 0.2 10/22/2013 1004   NITRITE NEGATIVE 03/15/2019 0305   LEUKOCYTESUR NEGATIVE 03/15/2019 0305   Sepsis Labs: @LABRCNTIP (procalcitonin:4,lacticidven:4)  ) Recent  Results (from the past 240 hour(s))  Urine culture     Status: Abnormal   Collection Time: 03/15/19  3:05 AM   Specimen: In/Out Cath Urine  Result Value Ref Range Status   Specimen Description IN/OUT CATH URINE  Final   Special Requests   Final    NONE Performed at [facility], [address] [place], [place] [address]    Culture MULTIPLE SPECIES PRESENT, SUGGEST RECOLLECTION (A)  Final   Report Status 03/16/2019 FINAL  Final  Culture, blood (Routine x 2)     Status: None (Preliminary result)   Collection Time: 03/15/19  3:20 AM   Specimen: BLOOD RIGHT ARM  Result Value Ref Range Status   Specimen Description BLOOD RIGHT ARM  Final   Special Requests   Final    BOTTLES DRAWN AEROBIC AND ANAEROBIC Blood Culture results may not be optimal due to an excessive volume of blood received in culture bottles   Culture   Final    NO GROWTH 1  DAY Performed at [facility], [place] [address] [place], [place] [address]    Report Status PENDING  Incomplete  Culture, blood (Routine x 2)     Status: None (Preliminary result)   Collection Time: 03/15/19  3:25 AM   Specimen: BLOOD RIGHT HAND  Result Value Ref Range Status   Specimen Description BLOOD RIGHT HAND  Final   Special Requests   Final    BOTTLES DRAWN AEROBIC ONLY Blood Culture results may not be optimal due to an excessive volume of blood received in culture bottles   Culture   Final    NO GROWTH 1 DAY Performed at [facility], [place] [address] [place], [place] [address]    Report Status PENDING  Incomplete  SARS CORONAVIRUS 2 Nasal Swab Aptima Multi Swab     Status: None   Collection Time: 03/15/19  6:39 AM   Specimen: Aptima Multi Swab; Nasal Swab  Result Value Ref Range Status   SARS Coronavirus 2 NEGATIVE NEGATIVE Final    Comment: (NOTE) SARS-CoV-2 target nucleic acids are NOT DETECTED. The SARS-CoV-2 RNA is generally detectable in upper and lower respiratory specimens during the acute phase of infection.  Negative results do not preclude SARS-CoV-2 infection, do not rule out co-infections with other pathogens, and should not be used as the sole basis for treatment or other patient management decisions. Negative results must be combined with clinical observations, patient history, and epidemiological information. The expected result is Negative. Fact Sheet for Patients: SugarRoll.be Fact Sheet for Healthcare Providers: https://www.woods-mathews.com/ This test is not yet approved or cleared by the [place] FDA and  has been authorized for detection and/or diagnosis of SARS-CoV-2 by FDA under an Emergency Use Authorization (EUA). This EUA will remain  in effect (meaning this test can be used) for the duration of the COVID-19 declaration under Section 56 4(b)(1) of the Act, 21 U.S.C. section 360bbb-3(b)(1), unless the authorization is terminated or revoked sooner. Performed at [facility], [place] [address], [place], [place] [address]       Studies: No results found.  Scheduled Meds: . buprenorphine  8 mg Sublingual TID  . buPROPion  150 mg Oral BID  . enoxaparin (LOVENOX) injection  70 mg Subcutaneous Q24H  . gabapentin  600 mg Oral TID  . nicotine  14 mg Transdermal Daily    Continuous Infusions: . piperacillin-tazobactam (ZOSYN)  IV    . vancomycin       LOS: 0 days     Alma Friendly, MD [facility]  If 7PM-7AM, please contact night-coverage [email or web address] 03/16/2019, 12:23 PM

## 2019-03-16 NOTE — Care Management Obs Status (Signed)
[place] NOTIFICATION   Patient Details  Name: TRISTAIN DAILY MRN: [id number] Date of Birth: Nov 05, 1970   Medicare Observation Status Notification Given:  Yes    Marilu Favre, RN 03/16/2019, 10:57 AM

## 2019-03-16 NOTE — Progress Notes (Signed)
Pharmacy Antibiotic Note  Scott [name] is a 48 y.o. male admitted on 03/15/2019 with cellulitis.  Pharmacy has been consulted for vancomycin and zosyn dosing.  Cefazolin was stopped.  Plan: Zosyn 3.375g IV q8h (4 hour infusion).  Vancomycin 1500 mg IV q 12 hrs.  Calc AUC ~ 394, on the lower side but adequate for treating cellulitis. Will follow cultures, renal function and clinical course.  Height: 6' (182.9 cm) Weight: (!) 310 lb (140.6 kg) IBW/kg (Calculated) : 77.6  Temp (24hrs), Avg:98.6 F (37 C), Min:98 F (36.7 C), Max:99.5 F (37.5 C)  Recent Labs  Lab 03/15/19 0329 03/15/19 0548 03/16/19 0418  WBC 8.5  --  9.4  CREATININE 0.75  --  0.78  LATICACIDVEN 1.9 0.4*  --     Estimated Creatinine Clearance: 166 mL/min (by C-G formula based on SCr of 0.78 mg/dL).    Allergies  Allergen Reactions  . Benadryl [Diphenhydramine Hcl] Anaphylaxis and Other (See Comments)    Muscles lock up.  . Celecoxib Diarrhea, Rash and Other (See Comments)    Nose bleeds, bloating, migraines   . Diphenhydramine Anaphylaxis    Hard time breathing, convulsions,  . Prednisone Other (See Comments)    Severe Anger  . Risperidone And Related Other (See Comments)    Overly sedates  . Nsaids Diarrhea, Rash and Other (See Comments)    Nose bleeds, bloating, migraines  . Other Other (See Comments)    STEROIDS--caused anger,irritation  . Rofecoxib Diarrhea, Rash and Other (See Comments)    Nose bleeds, bloating, migraines  . Tolmetin Diarrhea, Rash and Other (See Comments)    Nose bleeds, bloating, migraines   . Trazodone And Nefazodone Other (See Comments)    headaches  . Ibuprofen Rash  . Latex Itching, Rash and Other (See Comments)    Skin cracks  . Paliperidone Palmitate Er Other (See Comments)    Overly sedates    Antimicrobials this admission: Ancef 8/6 > 8/7 Vancomycin 8/7 > Zosyn 8/7 >  Dose adjustments this admission:  Microbiology results: 8/6 BCx x 2 > ngtd 8/6 UCx:  multiple species 8/6 COVID - neg  Thank you for allowing pharmacy to be a part of this patient's care.  Marguerite Olea, [facility] Clinical Pharmacist Phone [phone]  03/16/2019 11:13 AM
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
## 2019-03-16 NOTE — Plan of Care (Signed)
?  Problem: Clinical Measurements: ?Goal: Ability to maintain clinical measurements within normal limits will improve ?Outcome: Progressing ?Goal: Will remain free from infection ?Outcome: Progressing ?Goal: Diagnostic test results will improve ?Outcome: Progressing ?  ?

## 2019-03-17 LAB — BASIC METABOLIC PANEL
Anion gap: 9 (ref 5–15)
BUN: 7 mg/dL (ref 6–20)
CO2: 27 mmol/L (ref 22–32)
Calcium: 8.2 mg/dL — ABNORMAL LOW (ref 8.9–10.3)
Chloride: 103 mmol/L (ref 98–111)
Creatinine, Ser: 0.78 mg/dL (ref 0.61–1.24)
GFR calc Af Amer: >60 mL/min (ref >60)
GFR calc non Af Amer: >60 mL/min (ref >60)
Glucose, Bld: 106 mg/dL — ABNORMAL HIGH (ref 70–99)
Potassium: 3.8 mmol/L (ref 3.5–5.1)
Sodium: 139 mmol/L (ref 135–145)

## 2019-03-17 LAB — CBC WITH DIFFERENTIAL/PLATELET
Abs Immature Granulocytes: 0.06 10*3/uL (ref 0.00–0.07)
Basophils Absolute: 0 10*3/uL (ref 0.0–0.1)
Basophils Relative: 0 %
Eosinophils Absolute: 0.3 10*3/uL (ref 0.0–0.5)
Eosinophils Relative: 3 %
HCT: 39.9 % (ref 39.0–52.0)
Hemoglobin: 13.6 g/dL (ref 13.0–17.0)
Immature Granulocytes: 1 %
Lymphocytes Relative: 29 %
Lymphs Abs: 2.4 10*3/uL (ref 0.7–4.0)
MCH: 30.6 pg (ref 26.0–34.0)
MCHC: 34.1 g/dL (ref 30.0–36.0)
MCV: 89.9 fL (ref 80.0–100.0)
Monocytes Absolute: 1.1 10*3/uL — ABNORMAL HIGH (ref 0.1–1.0)
Monocytes Relative: 14 %
Neutro Abs: 4.4 10*3/uL (ref 1.7–7.7)
Neutrophils Relative %: 53 %
Platelets: 166 10*3/uL (ref 150–400)
RBC: 4.44 MIL/uL (ref 4.22–5.81)
RDW: 13 % (ref 11.5–15.5)
WBC: 8.2 10*3/uL (ref 4.0–10.5)
nRBC: 0 % (ref 0.0–0.2)

## 2019-03-17 MED ORDER — POLYETHYLENE GLYCOL 3350 17 G PO PACK
17.0000 g | PACK | Freq: Every day | ORAL | Status: DC
  Administered 2019-03-17 – 2019-03-19 (×3): 17 g via ORAL
  Filled 2019-03-17 (×3): qty 1

## 2019-03-17 NOTE — Progress Notes (Signed)
PROGRESS NOTE  Scott [name] ZOX:[id number] DOB: Nov 13, 1970 DOA: 03/15/2019 PCP: System, Pcp Not In  HPI/Recap of past 24 hours: HPI from Dr Ernestene MentionYates Scott [name] is a 48 y.o. male with medical history significant of polysubstance abuse including IVDA; PTSD; and bipolar presenting with L forearm infection. He was sitting at the computer and it felt like someone stuck a hot poker in his arm.  It swelled up. ?fever.  The arm problem was really noticeable yesterday and it happened quickly.  Strictly denies injecting drugs, reports clean since 7/19.  Also with sores on arms and legs and around mouth. He reports occasional lapses with cocaine and ETOH but strictly denies IVDA.  He denies SI "for a long time."  He reports h/o "bad" Hep C and is planning to start treatment but has to have at least 3 months of abstaining from drugs and alcohol prior to qualifying.  Patient admitted for further management    Today, very slight improvement of the left forearm cellulitis, still with significant tenderness, erythema and mild swelling.  Patient denies any chest pain, shortness of breath, fever/chills.  Assessment/Plan: Principal Problem:   Cellulitis of arm, left Active Problems:   Elevated liver enzymes   MDD (major depressive disorder), recurrent episode, severe (HCC)   Folliculitis   Polysubstance abuse (HCC)   Tobacco dependence  Left forearm cellulitis Very slight improvement noted Had a temp of 101.1 on 8/6, currently afebrile with no leukocytosis BC x2 NGTD Lactic acid is 0.4 CT forearm showed cellulitis without soft tissue emphysema or visible abscess Continue Vanco and Zosyn and monitor closely for de-escalation If worsening, may consult ID Monitor closely  Elevated LFTs Stable Reports history of hep C Plan to start treatment once 3 months sober  Polysubstance abuse UDS positive for cocaine, barbiturates, amphetamines Reports no cocaine use for a while, takes Fioricet and  phentermine Continue Suboxone Encouraged patient to continue to remain sober  Tobacco abuse Advised to quit Nicotine patch offered  Depression Denies any suicidal/homicidal ideation Continue Wellbutrin  Morbid obesity BMI 42.04 Continue phentermine       Malnutrition Type:      Malnutrition Characteristics:      Nutrition Interventions:       Estimated body mass index is 42.04 kg/m as calculated from the following:   Height as of this encounter: 6' (1.829 m).   Weight as of this encounter: 140.6 kg.     Code Status: Full  Family Communication: None at bedside  Disposition Plan: Likely home   Consultants:  None  Procedures:  None  Antimicrobials:  Vancomycin  Zosyn  DVT prophylaxis: Lovenox   Objective: Vitals:   03/16/19 0629 03/16/19 1500 03/16/19 2054 03/17/19 0439  BP: (!) 158/82 (!) 151/84 (!) 154/71 (!) 159/75  Pulse: 65 83 76 65  Resp: 18 18 16 16   Temp: 98.5 F (36.9 C) 98.8 F (37.1 C) 98.8 F (37.1 C) 98.7 F (37.1 C)  TempSrc: Oral Oral Oral Oral  SpO2: 97% 96% 97% 96%  Weight:      Height:        Intake/Output Summary (Last 24 hours) at 03/17/2019 1108 Last data filed at 03/16/2019 1521 Gross per 24 hour  Intake 410.36 ml  Output -  Net 410.36 ml   Filed Weights   03/15/19 0400 03/15/19 1155  Weight: 131.5 kg (!) 140.6 kg    Exam:  General: NAD   Cardiovascular: S1, S2 present  Respiratory: CTAB  Abdomen: Soft, nontender,  nondistended, bowel sounds present  Musculoskeletal: No bilateral pedal edema noted  Skin:  Left forearm cellulitis, with edema, erythema extending to the elbow region.  No track marks noted.  Also noted, scattered pustular lesions on his right arm and face  Psychiatry: Normal mood     Data Reviewed: CBC: Recent Labs  Lab 03/15/19 0329 03/16/19 0418 03/17/19 0412  WBC 8.5 9.4 8.2  NEUTROABS 5.7  --  4.4  HGB 17.1* 15.6 13.6  HCT 48.9 45.0 39.9  MCV 87.9 89.3 89.9  PLT  199 168 458   Basic Metabolic Panel: Recent Labs  Lab 03/15/19 0329 03/16/19 0418 03/17/19 0412  NA 136 134* 139  K 3.9 4.0 3.8  CL 99 99 103  CO2 28 25 27   GLUCOSE 105* 110* 106*  BUN 7 11 7   CREATININE 0.75 0.78 0.78  CALCIUM 8.6* 8.3* 8.2*   GFR: Estimated Creatinine Clearance: 166 mL/min (by C-G formula based on SCr of 0.78 mg/dL). Liver Function Tests: Recent Labs  Lab 03/15/19 0329  AST 124*  ALT 179*  ALKPHOS 102  BILITOT 0.8  PROT 7.2  ALBUMIN 3.7   No results for input(s): LIPASE, AMYLASE in the last 168 hours. No results for input(s): AMMONIA in the last 168 hours. Coagulation Profile: Recent Labs  Lab 03/15/19 0329  INR 1.0   Cardiac Enzymes: No results for input(s): CKTOTAL, CKMB, CKMBINDEX, TROPONINI in the last 168 hours. BNP (last 3 results) No results for input(s): PROBNP in the last 8760 hours. HbA1C: No results for input(s): HGBA1C in the last 72 hours. CBG: No results for input(s): GLUCAP in the last 168 hours. Lipid Profile: No results for input(s): CHOL, HDL, LDLCALC, TRIG, CHOLHDL, LDLDIRECT in the last 72 hours. Thyroid Function Tests: No results for input(s): TSH, T4TOTAL, FREET4, T3FREE, THYROIDAB in the last 72 hours. Anemia Panel: No results for input(s): VITAMINB12, FOLATE, FERRITIN, TIBC, IRON, RETICCTPCT in the last 72 hours. Urine analysis:    Component Value Date/Time   COLORURINE YELLOW 03/15/2019 0305   APPEARANCEUR CLOUDY (A) 03/15/2019 0305   LABSPEC 1.017 03/15/2019 0305   PHURINE 7.0 03/15/2019 0305   GLUCOSEU NEGATIVE 03/15/2019 0305   HGBUR MODERATE (A) 03/15/2019 0305   BILIRUBINUR NEGATIVE 03/15/2019 0305   KETONESUR NEGATIVE 03/15/2019 0305   PROTEINUR 30 (A) 03/15/2019 0305   UROBILINOGEN 0.2 10/22/2013 1004   NITRITE NEGATIVE 03/15/2019 0305   LEUKOCYTESUR NEGATIVE 03/15/2019 0305   Sepsis Labs: @LABRCNTIP (procalcitonin:4,lacticidven:4)  ) Recent Results (from the past 240 hour(s))  Urine culture      Status: Abnormal   Collection Time: 03/15/19  3:05 AM   Specimen: In/Out Cath Urine  Result Value Ref Range Status   Specimen Description IN/OUT CATH URINE  Final   Special Requests   Final    NONE Performed at [facility], [address], [place], [place] [address]    Culture MULTIPLE SPECIES PRESENT, SUGGEST RECOLLECTION (A)  Final   Report Status 03/16/2019 FINAL  Final  Culture, blood (Routine x 2)     Status: None (Preliminary result)   Collection Time: 03/15/19  3:20 AM   Specimen: BLOOD RIGHT ARM  Result Value Ref Range Status   Specimen Description BLOOD RIGHT ARM  Final   Special Requests   Final    BOTTLES DRAWN AEROBIC AND ANAEROBIC Blood Culture results may not be optimal due to an excessive volume of blood received in culture bottles   Culture   Final    NO GROWTH  2 DAYS Performed at [facility], [address] [place], [place] [address]    Report Status PENDING  Incomplete  Culture, blood (Routine x 2)     Status: None (Preliminary result)   Collection Time: 03/15/19  3:25 AM   Specimen: BLOOD RIGHT HAND  Result Value Ref Range Status   Specimen Description BLOOD RIGHT HAND  Final   Special Requests   Final    BOTTLES DRAWN AEROBIC ONLY Blood Culture results may not be optimal due to an excessive volume of blood received in culture bottles   Culture   Final    NO GROWTH 2 DAYS Performed at [facility], [address] [place], [place] [address]    Report Status PENDING  Incomplete  SARS CORONAVIRUS 2 Nasal Swab Aptima Multi Swab     Status: None   Collection Time: 03/15/19  6:39 AM   Specimen: Aptima Multi Swab; Nasal Swab  Result Value Ref Range Status   SARS Coronavirus 2 NEGATIVE NEGATIVE Final    Comment: (NOTE) SARS-CoV-2 target nucleic acids are NOT DETECTED. The SARS-CoV-2 RNA is generally detectable in upper and lower respiratory specimens during the acute phase of infection. Negative results do not preclude SARS-CoV-2 infection,  do not rule out co-infections with other pathogens, and should not be used as the sole basis for treatment or other patient management decisions. Negative results must be combined with clinical observations, patient history, and epidemiological information. The expected result is Negative. Fact Sheet for Patients: HairSlick.nohttps://www.fda.gov/media/138098/download Fact Sheet for Healthcare Providers: quierodirigir.comhttps://www.fda.gov/media/138095/download This test is not yet approved or cleared by the [place] FDA and  has been authorized for detection and/or diagnosis of SARS-CoV-2 by FDA under an Emergency Use Authorization (EUA). This EUA will remain  in effect (meaning this test can be used) for the duration of the COVID-19 declaration under Section 56 4(b)(1) of the Act, 21 U.S.C. section 360bbb-3(b)(1), unless the authorization is terminated or revoked sooner. Performed at [facility], [address] [place], [place] [address]       Studies: No results found.  Scheduled Meds: . buprenorphine  8 mg Sublingual TID  . buPROPion  150 mg Oral BID  . enoxaparin (LOVENOX) injection  70 mg Subcutaneous Q24H  . gabapentin  600 mg Oral TID  . nicotine  14 mg Transdermal Daily  . polyethylene glycol  17 g Oral Daily    Continuous Infusions: . piperacillin-tazobactam (ZOSYN)  IV 3.375 g (03/17/19 0615)  . vancomycin 1,500 mg (03/16/19 2349)     LOS: 1 day     Briant CedarNkeiruka J , MD [facility]  If 7PM-7AM, please contact night-coverage [email or web address] 03/17/2019, 11:08 AM

## 2019-03-18 LAB — CBC WITH DIFFERENTIAL/PLATELET
Abs Immature Granulocytes: 0.03 10*3/uL (ref 0.00–0.07)
Basophils Absolute: 0 10*3/uL (ref 0.0–0.1)
Basophils Relative: 1 %
Eosinophils Absolute: 0.3 10*3/uL (ref 0.0–0.5)
Eosinophils Relative: 4 %
HCT: 40.5 % (ref 39.0–52.0)
Hemoglobin: 13.8 g/dL (ref 13.0–17.0)
Immature Granulocytes: 0 %
Lymphocytes Relative: 27 %
Lymphs Abs: 2.1 10*3/uL (ref 0.7–4.0)
MCH: 30.8 pg (ref 26.0–34.0)
MCHC: 34.1 g/dL (ref 30.0–36.0)
MCV: 90.4 fL (ref 80.0–100.0)
Monocytes Absolute: 0.7 10*3/uL (ref 0.1–1.0)
Monocytes Relative: 9 %
Neutro Abs: 4.4 10*3/uL (ref 1.7–7.7)
Neutrophils Relative %: 59 %
Platelets: 203 10*3/uL (ref 150–400)
RBC: 4.48 MIL/uL (ref 4.22–5.81)
RDW: 13.1 % (ref 11.5–15.5)
WBC: 7.5 10*3/uL (ref 4.0–10.5)
nRBC: 0 % (ref 0.0–0.2)

## 2019-03-18 LAB — BASIC METABOLIC PANEL
Anion gap: 11 (ref 5–15)
BUN: 7 mg/dL (ref 6–20)
CO2: 25 mmol/L (ref 22–32)
Calcium: 8.4 mg/dL — ABNORMAL LOW (ref 8.9–10.3)
Chloride: 105 mmol/L (ref 98–111)
Creatinine, Ser: 0.8 mg/dL (ref 0.61–1.24)
GFR calc Af Amer: >60 mL/min (ref >60)
GFR calc non Af Amer: >60 mL/min (ref >60)
Glucose, Bld: 134 mg/dL — ABNORMAL HIGH (ref 70–99)
Potassium: 3.6 mmol/L (ref 3.5–5.1)
Sodium: 141 mmol/L (ref 135–145)

## 2019-03-18 MED ORDER — HYDRALAZINE HCL 20 MG/ML IJ SOLN
10.0000 mg | Freq: Four times a day (QID) | INTRAMUSCULAR | Status: DC | PRN
  Administered 2019-03-18: 10 mg via INTRAVENOUS
  Filled 2019-03-18: qty 1

## 2019-03-18 NOTE — Progress Notes (Signed)
PROGRESS NOTE  Scott [name] OJJ:[id number] DOB: 1970-10-24 DOA: 03/15/2019 PCP: System, Pcp Not In  HPI/Recap of past 24 hours: HPI from Dr Jerline Pain is a 48 y.o. male with medical history significant of polysubstance abuse including IVDA; PTSD; and bipolar presenting with L forearm infection. He was sitting at the computer and it felt like someone stuck a hot poker in his arm.  It swelled up. ?fever.  The arm problem was really noticeable yesterday and it happened quickly.  Strictly denies injecting drugs, reports clean since 7/19.  Also with sores on arms and legs and around mouth. He reports occasional lapses with cocaine and ETOH but strictly denies IVDA.  He denies SI "for a long time."  He reports h/o "bad" Hep C and is planning to start treatment but has to have at least 3 months of abstaining from drugs and alcohol prior to qualifying.  Patient admitted for further management    Today, patient reports some slight improvement of the left forearm cellulitis, still with some tenderness, erythema, and some swelling.  Patient denies any other symptoms.  Assessment/Plan: Principal Problem:   Cellulitis of arm, left Active Problems:   Elevated liver enzymes   MDD (major depressive disorder), recurrent episode, severe (HCC)   Folliculitis   Polysubstance abuse (HCC)   Tobacco dependence  Left forearm cellulitis Slight improvement noted Had a temp of 101.1 on 8/6, currently afebrile with no leukocytosis BC x2 NGTD Lactic acid is 0.4 CT forearm showed cellulitis without soft tissue emphysema or visible abscess Continue Vanco and Zosyn and monitor closely for de-escalation If no significant improvement in the next day or so, may consult ID Monitor closely  Elevated LFTs Stable Reports history of hep C Plan to start treatment once 3 months sober  Polysubstance abuse UDS positive for cocaine, barbiturates, amphetamines Reports no cocaine use for a while, takes Fioricet  and phentermine Continue Suboxone Encouraged patient to continue to remain sober  Tobacco abuse Advised to quit Nicotine patch offered  Depression Denies any suicidal/homicidal ideation Continue Wellbutrin  Morbid obesity BMI 42.04 Continue phentermine       Malnutrition Type:      Malnutrition Characteristics:      Nutrition Interventions:       Estimated body mass index is 42.04 kg/m as calculated from the following:   Height as of this encounter: 6' (1.829 m).   Weight as of this encounter: 140.6 kg.     Code Status: Full  Family Communication: None at bedside  Disposition Plan: Likely home   Consultants:  None  Procedures:  None  Antimicrobials:  Vancomycin  Zosyn  DVT prophylaxis: Lovenox   Objective: Vitals:   03/17/19 2054 03/17/19 2130 03/17/19 2244 03/18/19 0430  BP: (!) 186/88 (!) 178/85 (!) 168/86 (!) 152/79  Pulse: 74  70 63  Resp: 18   18  Temp: 98.4 F (36.9 C)   98.4 F (36.9 C)  TempSrc: Oral   Oral  SpO2: 95%   95%  Weight:      Height:        Intake/Output Summary (Last 24 hours) at 03/18/2019 1509 Last data filed at 03/18/2019 0844 Gross per 24 hour  Intake 1080 ml  Output -  Net 1080 ml   Filed Weights   03/15/19 0400 03/15/19 1155  Weight: 131.5 kg (!) 140.6 kg    Exam:  General: NAD   Cardiovascular: S1, S2 present  Respiratory: CTAB  Abdomen: Soft, nontender, nondistended,  bowel sounds present  Musculoskeletal: No bilateral pedal edema noted  Skin:  Left forearm cellulitis, with some erythema, tenderness, edema.  Psychiatry: Normal mood     Data Reviewed: CBC: Recent Labs  Lab 03/15/19 0329 03/16/19 0418 03/17/19 0412 03/18/19 0356  WBC 8.5 9.4 8.2 7.5  NEUTROABS 5.7  --  4.4 4.4  HGB 17.1* 15.6 13.6 13.8  HCT 48.9 45.0 39.9 40.5  MCV 87.9 89.3 89.9 90.4  PLT 199 168 166 203   Basic Metabolic Panel: Recent Labs  Lab 03/15/19 0329 03/16/19 0418 03/17/19 0412  03/18/19 0356  NA 136 134* 139 141  K 3.9 4.0 3.8 3.6  CL 99 99 103 105  CO2 28 25 27 25   GLUCOSE 105* 110* 106* 134*  BUN 7 11 7 7   CREATININE 0.75 0.78 0.78 0.80  CALCIUM 8.6* 8.3* 8.2* 8.4*   GFR: Estimated Creatinine Clearance: 166 mL/min (by C-G formula based on SCr of 0.8 mg/dL). Liver Function Tests: Recent Labs  Lab 03/15/19 0329  AST 124*  ALT 179*  ALKPHOS 102  BILITOT 0.8  PROT 7.2  ALBUMIN 3.7   No results for input(s): LIPASE, AMYLASE in the last 168 hours. No results for input(s): AMMONIA in the last 168 hours. Coagulation Profile: Recent Labs  Lab 03/15/19 0329  INR 1.0   Cardiac Enzymes: No results for input(s): CKTOTAL, CKMB, CKMBINDEX, TROPONINI in the last 168 hours. BNP (last 3 results) No results for input(s): PROBNP in the last 8760 hours. HbA1C: No results for input(s): HGBA1C in the last 72 hours. CBG: No results for input(s): GLUCAP in the last 168 hours. Lipid Profile: No results for input(s): CHOL, HDL, LDLCALC, TRIG, CHOLHDL, LDLDIRECT in the last 72 hours. Thyroid Function Tests: No results for input(s): TSH, T4TOTAL, FREET4, T3FREE, THYROIDAB in the last 72 hours. Anemia Panel: No results for input(s): VITAMINB12, FOLATE, FERRITIN, TIBC, IRON, RETICCTPCT in the last 72 hours. Urine analysis:    Component Value Date/Time   COLORURINE YELLOW 03/15/2019 0305   APPEARANCEUR CLOUDY (A) 03/15/2019 0305   LABSPEC 1.017 03/15/2019 0305   PHURINE 7.0 03/15/2019 0305   GLUCOSEU NEGATIVE 03/15/2019 0305   HGBUR MODERATE (A) 03/15/2019 0305   BILIRUBINUR NEGATIVE 03/15/2019 0305   KETONESUR NEGATIVE 03/15/2019 0305   PROTEINUR 30 (A) 03/15/2019 0305   UROBILINOGEN 0.2 10/22/2013 1004   NITRITE NEGATIVE 03/15/2019 0305   LEUKOCYTESUR NEGATIVE 03/15/2019 0305   Sepsis Labs: @LABRCNTIP (procalcitonin:4,lacticidven:4)  ) Recent Results (from the past 240 hour(s))  Urine culture     Status: Abnormal   Collection Time: 03/15/19  3:05 AM    Specimen: In/Out Cath Urine  Result Value Ref Range Status   Specimen Description IN/OUT CATH URINE  Final   Special Requests   Final    NONE Performed at [facility], [address] [place], [place] [address]    Culture MULTIPLE SPECIES PRESENT, SUGGEST RECOLLECTION (A)  Final   Report Status 03/16/2019 FINAL  Final  Culture, blood (Routine x 2)     Status: None (Preliminary result)   Collection Time: 03/15/19  3:20 AM   Specimen: BLOOD RIGHT ARM  Result Value Ref Range Status   Specimen Description BLOOD RIGHT ARM  Final   Special Requests   Final    BOTTLES DRAWN AEROBIC AND ANAEROBIC Blood Culture results may not be optimal due to an excessive volume of blood received in culture bottles   Culture   Final    NO GROWTH 3 DAYS Performed  at [facility], [address] [place], [place] [address]    Report Status PENDING  Incomplete  Culture, blood (Routine x 2)     Status: None (Preliminary result)   Collection Time: 03/15/19  3:25 AM   Specimen: BLOOD RIGHT HAND  Result Value Ref Range Status   Specimen Description BLOOD RIGHT HAND  Final   Special Requests   Final    BOTTLES DRAWN AEROBIC ONLY Blood Culture results may not be optimal due to an excessive volume of blood received in culture bottles   Culture   Final    NO GROWTH 3 DAYS Performed at [facility], [address] [place], [place] [address]    Report Status PENDING  Incomplete  SARS CORONAVIRUS 2 Nasal Swab Aptima Multi Swab     Status: None   Collection Time: 03/15/19  6:39 AM   Specimen: Aptima Multi Swab; Nasal Swab  Result Value Ref Range Status   SARS Coronavirus 2 NEGATIVE NEGATIVE Final    Comment: (NOTE) SARS-CoV-2 target nucleic acids are NOT DETECTED. The SARS-CoV-2 RNA is generally detectable in upper and lower respiratory specimens during the acute phase of infection. Negative results do not preclude SARS-CoV-2 infection, do not rule out co-infections with other pathogens,  and should not be used as the sole basis for treatment or other patient management decisions. Negative results must be combined with clinical observations, patient history, and epidemiological information. The expected result is Negative. Fact Sheet for Patients: HairSlick.nohttps://www.fda.gov/media/138098/download Fact Sheet for Healthcare Providers: quierodirigir.comhttps://www.fda.gov/media/138095/download This test is not yet approved or cleared by the [place] FDA and  has been authorized for detection and/or diagnosis of SARS-CoV-2 by FDA under an Emergency Use Authorization (EUA). This EUA will remain  in effect (meaning this test can be used) for the duration of the COVID-19 declaration under Section 56 4(b)(1) of the Act, 21 U.S.C. section 360bbb-3(b)(1), unless the authorization is terminated or revoked sooner. Performed at [facility], [address] [place], [place] [address]       Studies: No results found.  Scheduled Meds: . buprenorphine  8 mg Sublingual TID  . buPROPion  150 mg Oral BID  . enoxaparin (LOVENOX) injection  70 mg Subcutaneous Q24H  . gabapentin  600 mg Oral TID  . nicotine  14 mg Transdermal Daily  . polyethylene glycol  17 g Oral Daily    Continuous Infusions: . piperacillin-tazobactam (ZOSYN)  IV 3.375 g (03/18/19 1317)  . vancomycin 1,500 mg (03/18/19 1048)     LOS: 2 days     Briant CedarNkeiruka J Anastaisa Wooding, MD [facility]  If 7PM-7AM, please contact night-coverage [email or web address] 03/18/2019, 3:09 PM

## 2019-03-19 DIAGNOSIS — L739 Follicular disorder, unspecified: Secondary | ICD-10-CM

## 2019-03-19 LAB — BASIC METABOLIC PANEL
Anion gap: 10 (ref 5–15)
BUN: 6 mg/dL (ref 6–20)
CO2: 25 mmol/L (ref 22–32)
Calcium: 8.1 mg/dL — ABNORMAL LOW (ref 8.9–10.3)
Chloride: 103 mmol/L (ref 98–111)
Creatinine, Ser: 0.81 mg/dL (ref 0.61–1.24)
GFR calc Af Amer: >60 mL/min (ref >60)
GFR calc non Af Amer: >60 mL/min (ref >60)
Glucose, Bld: 131 mg/dL — ABNORMAL HIGH (ref 70–99)
Potassium: 3.5 mmol/L (ref 3.5–5.1)
Sodium: 138 mmol/L (ref 135–145)

## 2019-03-19 LAB — CBC WITH DIFFERENTIAL/PLATELET
Abs Immature Granulocytes: 0.04 10*3/uL (ref 0.00–0.07)
Basophils Absolute: 0.1 10*3/uL (ref 0.0–0.1)
Basophils Relative: 1 %
Eosinophils Absolute: 0.3 10*3/uL (ref 0.0–0.5)
Eosinophils Relative: 4 %
HCT: 38.8 % — ABNORMAL LOW (ref 39.0–52.0)
Hemoglobin: 13.2 g/dL (ref 13.0–17.0)
Immature Granulocytes: 1 %
Lymphocytes Relative: 32 %
Lymphs Abs: 2 10*3/uL (ref 0.7–4.0)
MCH: 30.6 pg (ref 26.0–34.0)
MCHC: 34 g/dL (ref 30.0–36.0)
MCV: 90 fL (ref 80.0–100.0)
Monocytes Absolute: 0.7 10*3/uL (ref 0.1–1.0)
Monocytes Relative: 11 %
Neutro Abs: 3.3 10*3/uL (ref 1.7–7.7)
Neutrophils Relative %: 51 %
Platelets: 212 10*3/uL (ref 150–400)
RBC: 4.31 MIL/uL (ref 4.22–5.81)
RDW: 12.8 % (ref 11.5–15.5)
WBC: 6.4 10*3/uL (ref 4.0–10.5)
nRBC: 0 % (ref 0.0–0.2)

## 2019-03-19 MED ORDER — DOXYCYCLINE MONOHYDRATE 100 MG PO TABS: 100.0000 mg | ORAL_TABLET | Freq: Two times a day (BID) | ORAL | 0 refills | Status: AC

## 2019-03-19 NOTE — Progress Notes (Signed)
Patient discharged to home. Verbalized understanding of all discharge instructions including cellulitis care, discharge medications and follow up if worsening symptoms occur.

## 2019-03-19 NOTE — Discharge Summary (Signed)
Discharge Summary  Scott [name] MLY:[id number] DOB: 20-Jul-1971  PCP: System, Pcp Not In  Admit date: 03/15/2019 Discharge date: 03/19/2019  Time spent: 40 mins  Recommendations for Outpatient Follow-up:  1. PCP in 1 week- patient stated he has a PCP  Discharge Diagnoses:  Active Hospital Problems   Diagnosis Date Noted  . Cellulitis of arm, left 03/15/2019  . Folliculitis 81/27/5170  . Polysubstance abuse ([place]) 03/15/2019  . Tobacco dependence 03/15/2019  . MDD (major depressive disorder), recurrent episode, severe ([place]) 05/06/2018  . Elevated liver enzymes 03/13/2018    Resolved Hospital Problems  No resolved problems to display.    Discharge Condition: Stable  Diet recommendation: Heart healthy  Vitals:   03/18/19 2300 03/19/19 0549  BP: (!) 169/89 (!) 159/82  Pulse: 75 68  Resp:  17  Temp:  97.8 F (36.6 C)  SpO2:  97%    History of present illness:  Scott [name] a 48 y.o.malewith medical history significant ofpolysubstance abuse including IVDA; PTSD; and bipolar presenting with L forearm infection.He was sitting at the computer and it felt like someone stuck a hot poker in his arm. It swelled up. ?fever. The arm problem was really noticeable yesterday and it happened quickly. Strictly denies injecting drugs, reports clean since 7/19. Also with sores on arms and legs and aroundmouth. He reports occasional lapses with cocaine and ETOH but strictly denies IVDA. He denies SI "for a long time." He reports h/o "bad" Hep C and is planning to start treatment but has to have at least 3 months of abstaining from drugs and alcohol prior to qualifying.  Patient admitted for further management.    Today, patient denies any new complaints.  Noted some improvement of his left forearm, still somewhat tender and some mild erythema.  Patient stable to be discharged, with close follow-up with his PCP.  Also discussed patient's blood pressure with him, noted some high  BP, patient assured me he will follow-up with his PCP.  Hospital Course:  Principal Problem:   Cellulitis of arm, left Active Problems:   Elevated liver enzymes   MDD (major depressive disorder), recurrent episode, severe ([facility])   Folliculitis   Polysubstance abuse ([place])   Tobacco dependence   Left forearm cellulitis Some improvement noted Had a temp of 101.1 on 8/6, currently afebrile with no leukocytosis BC x2 NGTD Lactic acid is 0.4 CT forearm showed cellulitis without soft tissue emphysema or visible abscess S/P IV Vanco and Zosyn, will switch to p.o. doxycycline for MRSA coverage for a total of 10 days of antibiotics due to extensive cellulitis Follow-up with PCP  Elevated LFTs Stable Reports history of hep C Plan to start treatment once 3 months sober  Polysubstance abuse UDS positive for cocaine, barbiturates, amphetamines Reports no cocaine use for a while, takes Fioricet and phentermine Continue Suboxone Encouraged patient to continue to remain sober  Tobacco abuse Advised to quit Nicotine patch offered  Depression Denies any suicidal/homicidal ideation Continue Wellbutrin  Morbid obesity BMI 42.04 Continue phentermine           Malnutrition Type:      Malnutrition Characteristics:      Nutrition Interventions:      Estimated body mass index is 42.04 kg/m as calculated from the following:   Height as of this encounter: 6' (1.829 m).   Weight as of this encounter: 140.6 kg.    Procedures:  None  Consultations:  None  Discharge Exam: BP (!) 159/82 (BP Location: Left Arm)  Pulse 68   Temp 97.8 F (36.6 C) (Oral)   Resp 17   Ht 6' (1.829 m)   Wt (!) 140.6 kg   SpO2 97%   BMI 42.04 kg/m   General: NAD Cardiovascular: S1, S2 present Respiratory: CTA B  Discharge Instructions You were cared for by a hospitalist during your hospital stay. If you have any questions about your discharge medications or the care you  received while you were in the hospital after you are discharged, you can call the unit and asked to speak with the hospitalist on call if the hospitalist that took care of you is not available. Once you are discharged, your primary care physician will handle any further medical issues. Please note that NO REFILLS for any discharge medications will be authorized once you are discharged, as it is imperative that you return to your primary care physician (or establish a relationship with a primary care physician if you do not have one) for your aftercare needs so that they can reassess your need for medications and monitor your lab values.   Allergies as of 03/19/2019      Reactions   Benadryl [diphenhydramine Hcl] Anaphylaxis, Other (See Comments)   Muscles lock up.   Celecoxib Diarrhea, Rash, Other (See Comments)   Nose bleeds, bloating, migraines   Diphenhydramine Anaphylaxis   Hard time breathing, convulsions,   Prednisone Other (See Comments)   Severe Anger   Risperidone And Related Other (See Comments)   Overly sedates   Nsaids Diarrhea, Rash, Other (See Comments)   Nose bleeds, bloating, migraines   Other Other (See Comments)   STEROIDS--caused anger,irritation   Rofecoxib Diarrhea, Rash, Other (See Comments)   Nose bleeds, bloating, migraines   Tolmetin Diarrhea, Rash, Other (See Comments)   Nose bleeds, bloating, migraines   Trazodone And Nefazodone Other (See Comments)   headaches   Ibuprofen Rash   Latex Itching, Rash, Other (See Comments)   Skin cracks   Paliperidone Palmitate Er Other (See Comments)   Overly sedates      Medication List    TAKE these medications   buPROPion 150 MG 12 hr tablet Commonly known as: WELLBUTRIN SR Take 150 mg by mouth 2 (two) times daily.   butalbital-acetaminophen-caffeine 50-325-40 MG tablet Commonly known as: FIORICET Take 1 tablet by mouth 2 (two) times daily as needed for headache.   doxycycline 100 MG tablet Commonly known as:  ADOXA Take 1 tablet (100 mg total) by mouth 2 (two) times daily for 7 days.   gabapentin 600 MG tablet Commonly known as: NEURONTIN Take 1 tablet (600 mg total) by mouth 3 (three) times daily.   phentermine 37.5 MG tablet Commonly known as: ADIPEX-P Take 37.5 mg by mouth daily before breakfast.   Suboxone 8-2 MG Film Generic drug: Buprenorphine HCl-Naloxone HCl Place 1 Film under the tongue 3 (three) times daily.      Allergies  Allergen Reactions  . Benadryl [Diphenhydramine Hcl] Anaphylaxis and Other (See Comments)    Muscles lock up.  . Celecoxib Diarrhea, Rash and Other (See Comments)    Nose bleeds, bloating, migraines   . Diphenhydramine Anaphylaxis    Hard time breathing, convulsions,  . Prednisone Other (See Comments)    Severe Anger  . Risperidone And Related Other (See Comments)    Overly sedates  . Nsaids Diarrhea, Rash and Other (See Comments)    Nose bleeds, bloating, migraines  . Other Other (See Comments)    STEROIDS--caused anger,irritation  . Rofecoxib  Diarrhea, Rash and Other (See Comments)    Nose bleeds, bloating, migraines  . Tolmetin Diarrhea, Rash and Other (See Comments)    Nose bleeds, bloating, migraines   . Trazodone And Nefazodone Other (See Comments)    headaches  . Ibuprofen Rash  . Latex Itching, Rash and Other (See Comments)    Skin cracks  . Paliperidone Palmitate Er Other (See Comments)    Overly sedates      The results of significant diagnostics from this hospitalization (including imaging, microbiology, ancillary and laboratory) are listed below for reference.    Significant Diagnostic Studies: Ct Elbow Left Wo Contrast  Result Date: 03/15/2019 CLINICAL DATA:  Necrotizing fasciitis. EXAM: CT OF THE UPPER LEFT EXTREMITY WITHOUT CONTRAST TECHNIQUE: Multidetector CT imaging of the upper left extremity was performed according to the standard protocol. COMPARISON:  None. FINDINGS: Bones/Joint/Cartilage Negative for bony erosion or  joint effusion.  No fracture Ligaments Suboptimally assessed by CT. Muscles and Tendons Fat edema in the antecubital fossa.  No notable intramuscular edema. Soft tissues Subcutaneous reticulation and expansion in the ventral forearm without opaque foreign body, visible collection, or soft tissue gas. None of the subcutaneous veins appear focally hyperdense. IMPRESSION: Forearm cellulitis without soft tissue emphysema or visible abscess. Electronically Signed   By: Marnee SpringJonathon  Watts M.D.   On: 03/15/2019 05:51   Dg Chest Port 1 View  Result Date: 03/15/2019 CLINICAL DATA:  Fever EXAM: PORTABLE CHEST 1 VIEW COMPARISON:  12/16/2017 FINDINGS: Normal heart size and stable mediastinal contours. There is mild indentation of the left trachea at the thoracic inlet from thyroid nodule by 2015 chest CT. No acute infiltrate or edema. No effusion or pneumothorax. No acute osseous findings. IMPRESSION: No active disease. Electronically Signed   By: Marnee SpringJonathon  Watts M.D.   On: 03/15/2019 04:27    Microbiology: Recent Results (from the past 240 hour(s))  Urine culture     Status: Abnormal   Collection Time: 03/15/19  3:05 AM   Specimen: In/Out Cath Urine  Result Value Ref Range Status   Specimen Description IN/OUT CATH URINE  Final   Special Requests   Final    NONE Performed at [facility], [address] [place], [place] [address]    Culture MULTIPLE SPECIES PRESENT, SUGGEST RECOLLECTION (A)  Final   Report Status 03/16/2019 FINAL  Final  Culture, blood (Routine x 2)     Status: None (Preliminary result)   Collection Time: 03/15/19  3:20 AM   Specimen: BLOOD RIGHT ARM  Result Value Ref Range Status   Specimen Description BLOOD RIGHT ARM  Final   Special Requests   Final    BOTTLES DRAWN AEROBIC AND ANAEROBIC Blood Culture results may not be optimal due to an excessive volume of blood received in culture bottles   Culture   Final    NO GROWTH 3 DAYS Performed at [facility], [address] [place], [place] [address]    Report Status PENDING  Incomplete  Culture, blood (Routine x 2)     Status: None (Preliminary result)   Collection Time: 03/15/19  3:25 AM   Specimen: BLOOD RIGHT HAND  Result Value Ref Range Status   Specimen Description BLOOD RIGHT HAND  Final   Special Requests   Final    BOTTLES DRAWN AEROBIC ONLY Blood Culture results may not be optimal due to an excessive volume of blood received in culture bottles   Culture   Final    NO GROWTH  3 DAYS Performed at [facility], [address] [place], [place] [address]    Report Status PENDING  Incomplete  SARS CORONAVIRUS 2 Nasal Swab Aptima Multi Swab     Status: None   Collection Time: 03/15/19  6:39 AM   Specimen: Aptima Multi Swab; Nasal Swab  Result Value Ref Range Status   SARS Coronavirus 2 NEGATIVE NEGATIVE Final    Comment: (NOTE) SARS-CoV-2 target nucleic acids are NOT DETECTED. The SARS-CoV-2 RNA is generally detectable in upper and lower respiratory specimens during the acute phase of infection. Negative results do not preclude SARS-CoV-2 infection, do not rule out co-infections with other pathogens, and should not be used as the sole basis for treatment or other patient management decisions. Negative results must be combined with clinical observations, patient history, and epidemiological information. The expected result is Negative. Fact Sheet for Patients: HairSlick.nohttps://www.fda.gov/media/138098/download Fact Sheet for Healthcare Providers: quierodirigir.comhttps://www.fda.gov/media/138095/download This test is not yet approved or cleared by the [place] FDA and  has been authorized for detection and/or diagnosis of SARS-CoV-2 by FDA under an Emergency Use Authorization (EUA). This EUA will remain  in effect (meaning this test can be used) for the duration of the COVID-19 declaration under Section 56 4(b)(1) of the Act, 21 U.S.C. section 360bbb-3(b)(1), unless the authorization is terminated or revoked  sooner. Performed at [facility], [address] [place], [place] [address]      Labs: Basic Metabolic Panel: Recent Labs  Lab 03/15/19 0329 03/16/19 0418 03/17/19 0412 03/18/19 0356 03/19/19 0325  NA 136 134* 139 141 138  K 3.9 4.0 3.8 3.6 3.5  CL 99 99 103 105 103  CO2 28 25 27 25 25   GLUCOSE 105* 110* 106* 134* 131*  BUN 7 11 7 7 6   CREATININE 0.75 0.78 0.78 0.80 0.81  CALCIUM 8.6* 8.3* 8.2* 8.4* 8.1*   Liver Function Tests: Recent Labs  Lab 03/15/19 0329  AST 124*  ALT 179*  ALKPHOS 102  BILITOT 0.8  PROT 7.2  ALBUMIN 3.7   No results for input(s): LIPASE, AMYLASE in the last 168 hours. No results for input(s): AMMONIA in the last 168 hours. CBC: Recent Labs  Lab 03/15/19 0329 03/16/19 0418 03/17/19 0412 03/18/19 0356 03/19/19 0325  WBC 8.5 9.4 8.2 7.5 6.4  NEUTROABS 5.7  --  4.4 4.4 3.3  HGB 17.1* 15.6 13.6 13.8 13.2  HCT 48.9 45.0 39.9 40.5 38.8*  MCV 87.9 89.3 89.9 90.4 90.0  PLT 199 168 166 203 212   Cardiac Enzymes: No results for input(s): CKTOTAL, CKMB, CKMBINDEX, TROPONINI in the last 168 hours. BNP: BNP (last 3 results) No results for input(s): BNP in the last 8760 hours.  ProBNP (last 3 results) No results for input(s): PROBNP in the last 8760 hours.  CBG: No results for input(s): GLUCAP in the last 168 hours.     Signed:  Briant CedarNkeiruka J Ezenduka, MD [facility] 03/19/2019, 9:41 AM

## 2019-03-20 LAB — CULTURE, BLOOD (ROUTINE X 2)
Culture: NO GROWTH
Culture: NO GROWTH

## 2020-08-23 ENCOUNTER — Encounter (HOSPITAL_COMMUNITY): Payer: Self-pay | Admitting: Emergency Medicine

## 2020-08-23 ENCOUNTER — Emergency Department (HOSPITAL_COMMUNITY)
Admission: EM | Admit: 2020-08-23 | Discharge: 2020-08-23 | Disposition: A | Discharge status: Home / Self Care | Payer: Medicare Other | Attending: Emergency Medicine | Admitting: Emergency Medicine

## 2020-08-23 ENCOUNTER — Other Ambulatory Visit: Payer: Self-pay

## 2020-08-23 DIAGNOSIS — M79645 Pain in left finger(s): Secondary | ICD-10-CM | POA: Insufficient documentation

## 2020-08-23 DIAGNOSIS — M542 Cervicalgia: Secondary | ICD-10-CM | POA: Diagnosis present

## 2020-08-23 DIAGNOSIS — Z5321 Procedure and treatment not carried out due to patient leaving prior to being seen by health care provider: Secondary | ICD-10-CM | POA: Diagnosis not present

## 2020-08-23 NOTE — ED Triage Notes (Signed)
Pt reports neck pain x 1 month that radiates to L finger tips.  Sling in place.  States his Dr. Rhina Brackett him to ED for ? Pinched nerve.

## 2020-08-23 NOTE — ED Notes (Signed)
Pt called for vitals x3 with no response.  

## 2020-09-23 ENCOUNTER — Other Ambulatory Visit: Payer: Self-pay | Admitting: Physician Assistant

## 2020-09-23 DIAGNOSIS — M5412 Radiculopathy, cervical region: Secondary | ICD-10-CM

## 2020-10-11 ENCOUNTER — Other Ambulatory Visit: Payer: Medicare Other

## 2021-01-06 ENCOUNTER — Emergency Department (HOSPITAL_COMMUNITY): Payer: Medicare Other

## 2021-01-06 ENCOUNTER — Other Ambulatory Visit: Payer: Self-pay

## 2021-01-06 ENCOUNTER — Encounter (HOSPITAL_COMMUNITY): Payer: Self-pay

## 2021-01-06 ENCOUNTER — Emergency Department (HOSPITAL_COMMUNITY)
Admission: EM | Admit: 2021-01-06 | Discharge: 2021-01-07 | Disposition: A | Discharge status: Home / Self Care | Payer: Medicare Other | Attending: Emergency Medicine | Admitting: Emergency Medicine

## 2021-01-06 DIAGNOSIS — R7989 Other specified abnormal findings of blood chemistry: Secondary | ICD-10-CM

## 2021-01-06 DIAGNOSIS — S79922A Unspecified injury of left thigh, initial encounter: Secondary | ICD-10-CM | POA: Diagnosis present

## 2021-01-06 DIAGNOSIS — S76212A Strain of adductor muscle, fascia and tendon of left thigh, initial encounter: Secondary | ICD-10-CM | POA: Insufficient documentation

## 2021-01-06 DIAGNOSIS — R7401 Elevation of levels of liver transaminase levels: Secondary | ICD-10-CM | POA: Diagnosis not present

## 2021-01-06 DIAGNOSIS — X501XXA Overexertion from prolonged static or awkward postures, initial encounter: Secondary | ICD-10-CM | POA: Insufficient documentation

## 2021-01-06 DIAGNOSIS — F17210 Nicotine dependence, cigarettes, uncomplicated: Secondary | ICD-10-CM | POA: Insufficient documentation

## 2021-01-06 DIAGNOSIS — Z91040 Latex allergy status: Secondary | ICD-10-CM | POA: Diagnosis not present

## 2021-01-06 DIAGNOSIS — R311 Benign essential microscopic hematuria: Secondary | ICD-10-CM | POA: Diagnosis not present

## 2021-01-06 LAB — COMPREHENSIVE METABOLIC PANEL
ALT: 164 U/L — ABNORMAL HIGH (ref 0–44)
AST: 199 U/L — ABNORMAL HIGH (ref 15–41)
Albumin: 3.3 g/dL — ABNORMAL LOW (ref 3.5–5.0)
Alkaline Phosphatase: 83 U/L (ref 38–126)
Anion gap: 8 (ref 5–15)
BUN: 14 mg/dL (ref 6–20)
CO2: 26 mmol/L (ref 22–32)
Calcium: 8.7 mg/dL — ABNORMAL LOW (ref 8.9–10.3)
Chloride: 105 mmol/L (ref 98–111)
Creatinine, Ser: 0.81 mg/dL (ref 0.61–1.24)
GFR, Estimated: >60 mL/min (ref >60)
Glucose, Bld: 110 mg/dL — ABNORMAL HIGH (ref 70–99)
Potassium: 3.8 mmol/L (ref 3.5–5.1)
Sodium: 139 mmol/L (ref 135–145)
Total Bilirubin: 0.8 mg/dL (ref 0.3–1.2)
Total Protein: 6.5 g/dL (ref 6.5–8.1)

## 2021-01-06 LAB — URINALYSIS, ROUTINE W REFLEX MICROSCOPIC
Bilirubin Urine: NEGATIVE
Glucose, UA: NEGATIVE mg/dL
Ketones, ur: NEGATIVE mg/dL
Leukocytes,Ua: NEGATIVE
Nitrite: NEGATIVE
Protein, ur: 100 mg/dL — AB
RBC / HPF: >50 RBC/hpf — ABNORMAL HIGH (ref 0–5)
Specific Gravity, Urine: 1.024 (ref 1.005–1.030)
pH: 6 (ref 5.0–8.0)

## 2021-01-06 LAB — CBC WITH DIFFERENTIAL/PLATELET
Abs Immature Granulocytes: 0.01 10*3/uL (ref 0.00–0.07)
Basophils Absolute: 0.1 10*3/uL (ref 0.0–0.1)
Basophils Relative: 1 %
Eosinophils Absolute: 0.3 10*3/uL (ref 0.0–0.5)
Eosinophils Relative: 4 %
HCT: 43.4 % (ref 39.0–52.0)
Hemoglobin: 15.3 g/dL (ref 13.0–17.0)
Immature Granulocytes: 0 %
Lymphocytes Relative: 35 %
Lymphs Abs: 2.3 10*3/uL (ref 0.7–4.0)
MCH: 31.9 pg (ref 26.0–34.0)
MCHC: 35.3 g/dL (ref 30.0–36.0)
MCV: 90.6 fL (ref 80.0–100.0)
Monocytes Absolute: 0.6 10*3/uL (ref 0.1–1.0)
Monocytes Relative: 9 %
Neutro Abs: 3.4 10*3/uL (ref 1.7–7.7)
Neutrophils Relative %: 51 %
Platelets: 159 10*3/uL (ref 150–400)
RBC: 4.79 MIL/uL (ref 4.22–5.81)
RDW: 13 % (ref 11.5–15.5)
WBC: 6.6 10*3/uL (ref 4.0–10.5)
nRBC: 0 % (ref 0.0–0.2)

## 2021-01-06 LAB — LIPASE, BLOOD: Lipase: 27 U/L (ref 11–51)

## 2021-01-06 MED ORDER — ONDANSETRON 4 MG PO TBDP
4.0000 mg | ORAL_TABLET | Freq: Once | ORAL | Status: AC
  Administered 2021-01-06: 4 mg via ORAL
  Filled 2021-01-06: qty 1

## 2021-01-06 MED ORDER — OXYCODONE-ACETAMINOPHEN 5-325 MG PO TABS
1.0000 | ORAL_TABLET | Freq: Once | ORAL | Status: DC
  Filled 2021-01-06: qty 1

## 2021-01-06 NOTE — ED Provider Notes (Signed)
Emergency Medicine Provider Triage Evaluation Note  Scott [name] , a 50 y.o. male  was evaluated in triage.  Pt complains of LLQ/testicular pain for the past 1-2 days. Also complains of nausea and vomiting. Seen  By PCP and was sent here to the ED for further eval. No fevers or chills. .  Review of Systems  Positive: + LLQ and testicular pain left side, nausea, vomiting Negative: - diarrhea, fevers  Physical Exam  BP 135/75 (BP Location: Left Arm)   Pulse 70   Temp 98.7 F (37.1 C) (Oral)   Resp 18   Ht 5\' 11"  (1.803 m)   Wt (!) 152 kg   SpO2 94%   BMI 46.72 kg/m  Gen:   Awake, no distress   Resp:  Normal effort  MSK:   Moves extremities without difficulty  Other:  + LLQ abdominal TTP and left testicular pain  Medical Decision Making  Medically screening exam initiated at 9:07 PM.  Appropriate orders placed.  was informed that the remainder of the evaluation will be completed by another provider, this initial triage assessment does not replace that evaluation, and the importance of remaining in the ED until their evaluation is complete.     [name], PA-C 01/06/21 2110    2111, MD 01/06/21 2312

## 2021-01-06 NOTE — ED Triage Notes (Signed)
Patient reports L sided groin pain into his testicles x 2 days, went to his PCP who sent him here for concern of inguinal hernia

## 2021-01-07 MED ORDER — DICLOFENAC SODIUM 1 % EX GEL: 4.0000 g | Freq: Four times a day (QID) | CUTANEOUS | 0 refills | Status: AC

## 2021-01-07 NOTE — ED Notes (Addendum)
Pt states that both his legs are swollen. RN notified.

## 2021-01-07 NOTE — ED Provider Notes (Signed)
[name]   CSN: [id number] Arrival date & time: 01/06/21  2029     History Chief Complaint  Patient presents with  . Hernia    Scott [name] is a 50 y.o. male.  50yo M with a chief complaints of left-sided groin pain.  Going on for couple days.  Started when he was making breakfast for his children and he slipped on the floor and almost did a split and felt a pop in his groin.  Pain with ambulation and palpation feels like it is in his low abdomen and radiates down to the left testicle.  Saw his family doctor today who suggested he come here to have a CT scan to rule out a hernia.  The history is provided by the patient.  Injury This is a new problem. The current episode started 2 days ago. The problem occurs constantly. The problem has not changed since onset.Associated symptoms include abdominal pain. Pertinent negatives include no chest pain, no headaches and no shortness of breath. The symptoms are aggravated by bending, twisting and walking. Nothing relieves the symptoms. He has tried nothing for the symptoms. The treatment provided no relief.       Past Medical History:  Diagnosis Date  . Accidental heroin overdose (HCC)   . Anxiety   . Back pain   . Bipolar 1 disorder (HCC)   . Chronic back pain   . Depression   . Drug-seeking behavior   . GERD (gastroesophageal reflux disease)   . Heroin abuse (HCC)   . History of ETOH abuse   . Myocardial infarction [facility](HCC)    Pt says that the doctor determined it was panic attacks  . Opioid dependence (HCC)   . PTSD (post-traumatic stress disorder)   . Seizures (HCC)    alcohol induced- pt reports sober for 6 months    Patient Active Problem List   Diagnosis Date Noted  . Cellulitis of arm, left 03/15/2019  . Folliculitis 03/15/2019  . Polysubstance abuse (HCC) 03/15/2019  . Tobacco dependence 03/15/2019  . Severe recurrent major depression without psychotic features  (HCC) 08/28/2018  . MDD (major depressive disorder), recurrent episode, severe (HCC) 05/06/2018  . MDD (major depressive disorder) 05/06/2018  . Elevated liver enzymes 03/13/2018  . MDD (major depressive disorder), recurrent severe, without psychosis (HCC) 03/06/2018  . MDD (major depressive disorder), severe (HCC) 12/16/2017  . Schizoaffective disorder (HCC) 06/07/2017  . Alcohol dependence with alcohol-induced mood disorder (HCC) 07/24/2015  . Alcohol withdrawal (HCC) 07/24/2015  . Cocaine use disorder, mild, abuse (HCC) 07/24/2015  . Substance induced mood disorder (HCC) 07/22/2015  . Drug-seeking behavior 02/16/2015  . PTSD (post-traumatic stress disorder) 12/26/2014  . Opioid type dependence, continuous (HCC) 12/26/2014  . Major depressive disorder, recurrent episode, moderate (HCC)   . Chest pain at rest 10/22/2013  . Chronic pain syndrome 10/22/2013  . Seizure disorder (HCC) 10/22/2013  . Chest pain 10/21/2013    Past Surgical History:  Procedure Laterality Date  . ABDOMINAL SURGERY    . APPENDECTOMY    . BACK SURGERY         Family History  Problem Relation Age of Onset  . Hypertension Mother   . CAD Father   . COPD Father   . Stroke Father   . Testicular cancer Brother     Social History   Tobacco Use  . Smoking status: Current Every Day Smoker    Packs/day: 1.50    Types: Cigarettes  .  Smokeless tobacco: Never Used  . Tobacco comment: vaping, wenaed down to 5 mg; would like a patch  Vaping Use  . Vaping Use: Never used  Substance Use Topics  . Alcohol use: Not Currently    Comment: Pt reports sober for 6 months  . Drug use: Yes    Types: Methamphetamines, Marijuana, Cocaine    Comment: cocaine about about a month ago    Home Medications Prior to Admission medications   Medication Sig Start Date End Date Taking? Authorizing Provider  Buprenorphine HCl-Naloxone HCl (SUBOXONE) 8-2 MG FILM Place 1 Film under the tongue 3 (three) times daily.     [provider]  buPROPion (WELLBUTRIN SR) 150 MG 12 hr tablet Take 150 mg by mouth 2 (two) times daily.    [provider]  butalbital-acetaminophen-caffeine (FIORICET) 50-325-40 MG tablet Take 1 tablet by mouth 2 (two) times daily as needed for headache.    [provider]  gabapentin (NEURONTIN) 600 MG tablet Take 1 tablet (600 mg total) by mouth 3 (three) times daily. 09/01/18   Malvin Johns, MD  phentermine (ADIPEX-P) 37.5 MG tablet Take 37.5 mg by mouth daily before breakfast.    [provider]    Allergies    Benadryl [diphenhydramine hcl], Celecoxib, Diphenhydramine, Prednisone, Risperidone and related, Nsaids, Other, Rofecoxib, Tolmetin, Trazodone and nefazodone, Ibuprofen, Latex, and Paliperidone palmitate er  Review of Systems   Review of Systems  Constitutional: Negative for chills and fever.  HENT: Negative for congestion and facial swelling.   Eyes: Negative for discharge and visual disturbance.  Respiratory: Negative for shortness of breath.   Cardiovascular: Negative for chest pain and palpitations.  Gastrointestinal: Positive for abdominal pain. Negative for diarrhea and vomiting.  Genitourinary: Positive for testicular pain.  Musculoskeletal: Negative for arthralgias and myalgias.  Skin: Negative for color change and rash.  Neurological: Negative for tremors, syncope and headaches.  Psychiatric/Behavioral: Negative for confusion and dysphoric mood.    Physical Exam Updated Vital Signs BP 126/76 (BP Location: Right Arm)   Pulse 60   Temp 98.7 F (37.1 C) (Oral)   Resp 20   Ht 5\' 11"  (1.803 m)   Wt (!) 152 kg   SpO2 97%   BMI 46.72 kg/m   Physical Exam Vitals and nursing note reviewed.  Constitutional:      Appearance: He is well-developed.  HENT:     Head: Normocephalic and atraumatic.  Eyes:     Pupils: Pupils are equal, round, and reactive to light.  Neck:     Vascular: No JVD.  Cardiovascular:     Rate and  Rhythm: Normal rate and regular rhythm.     Heart sounds: No murmur heard. No friction rub. No gallop.   Pulmonary:     Effort: No respiratory distress.     Breath sounds: No wheezing.  Abdominal:     General: There is no distension.     Tenderness: There is no guarding or rebound.  Genitourinary:    Comments: Cremasteric reflex intact bilaterally.  Normal lie of both testicles.  Mild diffuse tenderness but worse with palpation of the attachment of the adductor muscle to the pelvis. Musculoskeletal:        General: Normal range of motion.     Cervical back: Normal range of motion and neck supple.     Comments: Pain with AB and adduction and of the left lower extremity.  Pulse motor and sensation intact.  There is pain at the attachment of the  adductor to the pubic symphysis which is the area of his most discomfort.  There is no palpable mass.  Skin:    Coloration: Skin is not pale.     Findings: No rash.  Neurological:     Mental Status: He is alert and oriented to person, place, and time.  Psychiatric:        Behavior: Behavior normal.     ED Results / Procedures / Treatments   Labs (all labs ordered are listed, but only abnormal results are displayed) Labs Reviewed  COMPREHENSIVE METABOLIC PANEL - Abnormal; Notable for the following components:      Result Value   Glucose, Bld 110 (*)    Calcium 8.7 (*)    Albumin 3.3 (*)    AST 199 (*)    ALT 164 (*)    All other components within normal limits  URINALYSIS, ROUTINE W REFLEX MICROSCOPIC - Abnormal; Notable for the following components:   APPearance HAZY (*)    Hgb urine dipstick LARGE (*)    Protein, ur 100 (*)    RBC / HPF >50 (*)    Bacteria, UA RARE (*)    All other components within normal limits  LIPASE, BLOOD  CBC WITH DIFFERENTIAL/PLATELET    EKG None  Radiology CT Abdomen Pelvis Wo Contrast  Result Date: 01/06/2021 CLINICAL DATA:  50 year old male with left lower quadrant abdominal pain. Concern for  hernia. EXAM: CT ABDOMEN AND PELVIS WITHOUT CONTRAST TECHNIQUE: Multidetector CT imaging of the abdomen and pelvis was performed following the standard protocol without IV contrast. COMPARISON:  CT abdomen pelvis dated 03/29/2010. FINDINGS: Evaluation of this exam is limited in the absence of intravenous contrast. Lower chest: The visualized lung bases are clear. Intra-abdominal free air or free fluid. Hepatobiliary: Diffuse fatty liver. No intrahepatic biliary ductal dilatation. The gallbladder is unremarkable. Pancreas: Unremarkable. No pancreatic ductal dilatation or surrounding inflammatory changes. Spleen: The spleen is enlarged measuring up to 17 cm in length. Adrenals/Urinary Tract: The adrenal glands unremarkable. There is a 3 mm nonobstructing left renal upper pole calculus. No hydronephrosis. The right kidney is unremarkable. The visualized ureters and urinary bladder are unremarkable. Stomach/Bowel: There is sigmoid diverticulosis without active inflammatory changes. There is moderate stool throughout the colon. There is no bowel obstruction or active inflammation. Appendectomy. Vascular/Lymphatic: Mild aortoiliac atheromatous crash that mild atherosclerotic calcification of the abdominal aorta. The IVC is unremarkable. No portal venous gas. There is no adenopathy. Reproductive: The prostate and seminal vesicles are grossly unremarkable. No pelvic mass Other: None Musculoskeletal: Multilevel degenerative changes with disc desiccation and vacuum phenomena. No acute osseous pathology. IMPRESSION: 1. No acute intra-abdominal or pelvic pathology.  No hernia. 2. Sigmoid diverticulosis. No bowel obstruction. 3. Fatty liver. 4. Splenomegaly. 5. A 3 mm nonobstructing left renal upper pole calculus. No hydronephrosis. 6. Aortic Atherosclerosis (ICD10-I70.0). Electronically Signed   By: Elgie Collard M.D.   On: 01/06/2021 21:46    Procedures Procedures   Medications Ordered in ED Medications   oxyCODONE-acetaminophen (PERCOCET/ROXICET) 5-325 MG per tablet 1 tablet (1 tablet Oral Not Given 01/06/21 2123)  ondansetron (ZOFRAN-ODT) disintegrating tablet 4 mg (4 mg Oral Given 01/06/21 2123)    ED Course  I have reviewed the triage vital signs and the nursing notes.  Pertinent labs & imaging results that were available during my care of the patient were reviewed by me and considered in my medical decision making (see chart for details).    MDM Rules/Calculators/A&P  50 yo M with a chief complaint of left-sided groin pain after slipping and almost doing a split.  Seen by his family doctor and there was some concern for hernia and so sent here for CT scan.  CT scan is negative for acute intra-abdominal pathology.  Had blood work obtained that is consistent with his blood work from prior with mild LFT elevation.  His UA did have blood in it.  He had blood in a year ago as well.  We will give him urology follow-up.  Treat as a groin strain as that seems to be the most likely diagnosis.  PCP follow-up.  3:58 AM:  I have discussed the diagnosis/risks/treatment options with the patient and believe the pt to be eligible for discharge home to follow-up with PCP. We also discussed returning to the ED immediately if new or worsening sx occur. We discussed the sx which are most concerning (e.g., sudden worsening pain, fever, inability to tolerate by mouth) that necessitate immediate return. Medications administered to the patient during their visit and any new prescriptions provided to the patient are listed below.  Medications given during this visit Medications  oxyCODONE-acetaminophen (PERCOCET/ROXICET) 5-325 MG per tablet 1 tablet (1 tablet Oral Not Given 01/06/21 2123)  ondansetron (ZOFRAN-ODT) disintegrating tablet 4 mg (4 mg Oral Given 01/06/21 2123)     The patient appears reasonably screen and/or stabilized for discharge and I doubt any other medical condition or  other [facility] requiring further screening, evaluation, or treatment in the ED at this time prior to discharge.   Final Clinical Impression(s) / ED Diagnoses Final diagnoses:  Strain of groin, left, initial encounter  LFT elevation  Benign essential microscopic hematuria    Rx / DC Orders ED Discharge Orders    None       Melene Plan, DO 01/07/21 3220

## 2021-01-07 NOTE — Discharge Instructions (Signed)
Take tylenol 1000mg (2 extra strength) four times a day. Use the gel as prescribed.  Follow up with your family doc.   You have blood in your urine.  This is something the urologist sometimes will work up further.  I have provided their office info.

## 2021-07-12 ENCOUNTER — Encounter (HOSPITAL_COMMUNITY): Payer: Self-pay | Admitting: Emergency Medicine

## 2021-07-12 ENCOUNTER — Emergency Department (HOSPITAL_COMMUNITY)
Admission: EM | Admit: 2021-07-12 | Discharge: 2021-07-12 | Disposition: A | Discharge status: Home / Self Care | Attending: Emergency Medicine | Admitting: Emergency Medicine

## 2021-07-12 ENCOUNTER — Emergency Department (HOSPITAL_COMMUNITY)

## 2021-07-12 ENCOUNTER — Other Ambulatory Visit: Payer: Self-pay

## 2021-07-12 DIAGNOSIS — R072 Precordial pain: Secondary | ICD-10-CM | POA: Insufficient documentation

## 2021-07-12 DIAGNOSIS — Z91040 Latex allergy status: Secondary | ICD-10-CM | POA: Diagnosis not present

## 2021-07-12 DIAGNOSIS — R079 Chest pain, unspecified: Secondary | ICD-10-CM

## 2021-07-12 DIAGNOSIS — K219 Gastro-esophageal reflux disease without esophagitis: Secondary | ICD-10-CM | POA: Insufficient documentation

## 2021-07-12 DIAGNOSIS — R001 Bradycardia, unspecified: Secondary | ICD-10-CM | POA: Insufficient documentation

## 2021-07-12 DIAGNOSIS — R110 Nausea: Secondary | ICD-10-CM | POA: Diagnosis not present

## 2021-07-12 DIAGNOSIS — I10 Essential (primary) hypertension: Secondary | ICD-10-CM | POA: Diagnosis not present

## 2021-07-12 DIAGNOSIS — R42 Dizziness and giddiness: Secondary | ICD-10-CM | POA: Insufficient documentation

## 2021-07-12 DIAGNOSIS — F17210 Nicotine dependence, cigarettes, uncomplicated: Secondary | ICD-10-CM | POA: Insufficient documentation

## 2021-07-12 LAB — BASIC METABOLIC PANEL
Anion gap: 9 (ref 5–15)
BUN: 7 mg/dL (ref 6–20)
CO2: 25 mmol/L (ref 22–32)
Calcium: 8.7 mg/dL — ABNORMAL LOW (ref 8.9–10.3)
Chloride: 103 mmol/L (ref 98–111)
Creatinine, Ser: 0.65 mg/dL (ref 0.61–1.24)
GFR, Estimated: >60 mL/min (ref >60)
Glucose, Bld: 100 mg/dL — ABNORMAL HIGH (ref 70–99)
Potassium: 3.8 mmol/L (ref 3.5–5.1)
Sodium: 137 mmol/L (ref 135–145)

## 2021-07-12 LAB — CBC
HCT: 44.1 % (ref 39.0–52.0)
Hemoglobin: 15.7 g/dL (ref 13.0–17.0)
MCH: 32.2 pg (ref 26.0–34.0)
MCHC: 35.6 g/dL (ref 30.0–36.0)
MCV: 90.6 fL (ref 80.0–100.0)
Platelets: 171 10*3/uL (ref 150–400)
RBC: 4.87 MIL/uL (ref 4.22–5.81)
RDW: 12.8 % (ref 11.5–15.5)
WBC: 8.4 10*3/uL (ref 4.0–10.5)
nRBC: 0 % (ref 0.0–0.2)

## 2021-07-12 LAB — TROPONIN I (HIGH SENSITIVITY)
Troponin I (High Sensitivity): 4 ng/L (ref <18)
Troponin I (High Sensitivity): 5 ng/L (ref <18)

## 2021-07-12 NOTE — ED Triage Notes (Signed)
Pt arrived via EMS in custody from jail where pt serves his sentence on weekends only .  MI in the past.  Today about 1500 began having pain like "someone punched him in the chest"  Some relief with 2 nitro en route.  Diaphoretic in triage.  Pt takes Lasix and has noticed LE swelling today.  Pt does not take potassium supplement.  Pt is recovering addict and declines any narcotic med for tx.

## 2021-07-12 NOTE — ED Provider Notes (Signed)
Emergency Medicine Provider Triage Evaluation Note  Scott [name] , a 50 y.o. male  was evaluated in triage.  Patient states that he started having chest pain that started about 2 to 3 hours ago.  It started in the left center of his chest.  It has been constant since then.  He states that it feels like someone is punching him in the chest.  He had associated diaphoresis and all body tingling.  He denies any radiation of the chest pain.  He does have some associated nausea and increased work of breathing.  Denies any vomiting.  He has a history of an NSTEMI but has never had a stent placed.  Review of Systems  Positive: See above Negative:   Physical Exam  BP 139/82   Pulse (!) 50   Temp 98.3 F (36.8 C) (Oral)   Resp 15   SpO2 94%  Gen:   Awake, no distress   Resp:  Normal effort  MSK:   Moves extremities without difficulty  Other:    Medical Decision Making  Medically screening exam initiated at 4:48 PM.  Appropriate orders placed.  [name] was informed that the remainder of the evaluation will be completed by another provider, this initial triage assessment does not replace that evaluation, and the importance of remaining in the ED until their evaluation is complete.     Therese Sarah 07/12/21 1649    Gerhard Munch, MD 07/12/21 [phone]

## 2021-07-12 NOTE — ED Provider Notes (Addendum)
[facility] EMERGENCY DEPARTMENT Provider Note   CSN: [id number] Arrival date & time: 07/12/21  1621     History Chief Complaint  Patient presents with   Chest Pain    [name] is a 50 y.o. male.  The history is provided by the patient and medical records.  Chest Pain Pain location:  Substernal area Pain quality: pressure   Pain radiates to:  Does not radiate Pain severity:  Moderate Onset quality:  Gradual Duration:  2 hours Timing:  Constant Progression:  Resolved Chronicity:  New Context: at rest   Relieved by:  Nothing Worsened by:  Nothing Associated symptoms: dizziness and nausea   Associated symptoms: no abdominal pain, no back pain, no cough, no fever, no palpitations, no shortness of breath and no vomiting    HPI: A 50 year old patient with a history of hypertension, hypercholesterolemia and obesity presents for evaluation of chest pain. Initial onset of pain was approximately 3-6 hours ago. The patient's chest pain is described as heaviness/pressure/tightness and is not worse with exertion. The patient complains of nausea. The patient's chest pain is middle- or left-sided, is not well-localized, is not sharp and does not radiate to the arms/jaw/neck. The patient denies diaphoresis. The patient has no history of stroke, has no history of peripheral artery disease, has not smoked in the past 90 days, denies any history of treated diabetes and has no relevant family history of coronary artery disease (first degree relative at less than age 30).   Past Medical History:  Diagnosis Date   Accidental heroin overdose (HCC)    Anxiety    Back pain    Bipolar 1 disorder (HCC)    Chronic back pain    Depression    Drug-seeking behavior    GERD (gastroesophageal reflux disease)    Heroin abuse (HCC)    History of ETOH abuse    Myocardial infarction (HCC)    Pt says that the doctor determined it was panic attacks   Opioid dependence (HCC)    PTSD  (post-traumatic stress disorder)    Seizures (HCC)    alcohol induced- pt reports sober for 6 months    Patient Active Problem List   Diagnosis Date Noted   Cellulitis of arm, left 03/15/2019   Folliculitis 03/15/2019   Polysubstance abuse (HCC) 03/15/2019   Tobacco dependence 03/15/2019   Severe recurrent major depression without psychotic features (HCC) 08/28/2018   MDD (major depressive disorder), recurrent episode, severe (HCC) 05/06/2018   MDD (major depressive disorder) 05/06/2018   Elevated liver enzymes 03/13/2018   MDD (major depressive disorder), recurrent severe, without psychosis (HCC) 03/06/2018   MDD (major depressive disorder), severe (HCC) 12/16/2017   Schizoaffective disorder (HCC) 06/07/2017   Alcohol dependence with alcohol-induced mood disorder (HCC) 07/24/2015   Alcohol withdrawal (HCC) 07/24/2015   Cocaine use disorder, mild, abuse (HCC) 07/24/2015   Substance induced mood disorder (HCC) 07/22/2015   Drug-seeking behavior 02/16/2015   PTSD (post-traumatic stress disorder) 12/26/2014   Opioid type dependence, continuous (HCC) 12/26/2014   Major depressive disorder, recurrent episode, moderate (HCC)    Chest pain at rest 10/22/2013   Chronic pain syndrome 10/22/2013   Seizure disorder (HCC) 10/22/2013   Chest pain 10/21/2013    Past Surgical History:  Procedure Laterality Date   ABDOMINAL SURGERY     APPENDECTOMY     BACK SURGERY         Family History  Problem Relation Age of Onset   Hypertension Mother  CAD Father    COPD Father    Stroke Father    Testicular cancer Brother     Social History   Tobacco Use   Smoking status: Every Day    Packs/day: 1.50    Types: Cigarettes   Smokeless tobacco: Never   Tobacco comments:    vaping, wenaed down to 5 mg; would like a patch  Vaping Use   Vaping Use: Never used  Substance Use Topics   Alcohol use: Not Currently    Comment: Pt reports sober for 6 months   Drug use: Yes    Types:  Methamphetamines, Marijuana, Cocaine    Comment: cocaine about about a month ago    Home Medications Prior to Admission medications   Medication Sig Start Date End Date Taking? Authorizing Provider  Buprenorphine HCl-Naloxone HCl (SUBOXONE) 8-2 MG FILM Place 1 Film under the tongue 3 (three) times daily.    [provider]  buPROPion (WELLBUTRIN SR) 150 MG 12 hr tablet Take 150 mg by mouth 2 (two) times daily.    [provider]  butalbital-acetaminophen-caffeine (FIORICET) 50-325-40 MG tablet Take 1 tablet by mouth 2 (two) times daily as needed for headache.    [provider]  diclofenac Sodium (VOLTAREN) 1 % GEL Apply 4 g topically 4 (four) times daily. 01/07/21   Deno Etienne, DO  gabapentin (NEURONTIN) 600 MG tablet Take 1 tablet (600 mg total) by mouth 3 (three) times daily. 09/01/18   Johnn Hai, MD  phentermine (ADIPEX-P) 37.5 MG tablet Take 37.5 mg by mouth daily before breakfast.    [provider]    Allergies    Benadryl [diphenhydramine hcl], Celecoxib, Diphenhydramine, Prednisone, Risperidone and related, Nsaids, Other, Rofecoxib, Tolmetin, Trazodone and nefazodone, Ibuprofen, Latex, and Paliperidone palmitate er  Review of Systems   Review of Systems  Constitutional:  Negative for chills and fever.  HENT:  Negative for ear pain and sore throat.   Eyes:  Negative for pain and visual disturbance.  Respiratory:  Negative for cough and shortness of breath.   Cardiovascular:  Positive for chest pain. Negative for palpitations.  Gastrointestinal:  Positive for nausea. Negative for abdominal pain and vomiting.  Genitourinary:  Negative for dysuria and hematuria.  Musculoskeletal:  Negative for arthralgias and back pain.  Skin:  Negative for color change and rash.  Neurological:  Positive for dizziness. Negative for seizures and syncope.  All other systems reviewed and are negative.  Physical Exam Updated Vital Signs BP (!) 169/84   Pulse (!)  44   Temp 98.3 F (36.8 C) (Oral)   Resp 15   SpO2 100%   Physical Exam Vitals and nursing note reviewed.  Constitutional:      General: He is not in acute distress.    Appearance: Normal appearance. He is well-developed. He is not ill-appearing.  HENT:     Head: Normocephalic and atraumatic.     Right Ear: External ear normal.     Left Ear: External ear normal.     Nose: Nose normal. No congestion.     Mouth/Throat:     Mouth: Mucous membranes are moist.     Pharynx: Oropharynx is clear. No posterior oropharyngeal erythema.  Eyes:     Extraocular Movements: Extraocular movements intact.     Conjunctiva/sclera: Conjunctivae normal.     Pupils: Pupils are equal, round, and reactive to light.  Cardiovascular:     Rate and Rhythm: Normal rate and regular rhythm.     Pulses:  Normal pulses.          Radial pulses are 2+ on the right side and 2+ on the left side.     Heart sounds: No murmur heard. Pulmonary:     Effort: Pulmonary effort is normal. No respiratory distress.     Breath sounds: Normal breath sounds. No wheezing, rhonchi or rales.  Abdominal:     General: Abdomen is flat. Bowel sounds are normal.     Palpations: Abdomen is soft.     Tenderness: There is no abdominal tenderness. There is no guarding or rebound.  Musculoskeletal:        General: No swelling, tenderness or deformity. Normal range of motion.     Cervical back: Normal range of motion and neck supple. No rigidity.  Skin:    General: Skin is warm and dry.     Capillary Refill: Capillary refill takes less than 2 seconds.     Findings: No rash.  Neurological:     General: No focal deficit present.     Mental Status: He is alert and oriented to person, place, and time.     Cranial Nerves: No cranial nerve deficit.     Motor: No weakness.  Psychiatric:        Mood and Affect: Mood normal.    ED Results / Procedures / Treatments   Labs (all labs ordered are listed, but only abnormal results are  displayed) Labs Reviewed  BASIC METABOLIC PANEL - Abnormal; Notable for the following components:      Result Value   Glucose, Bld 100 (*)    Calcium 8.7 (*)    All other components within normal limits  CBC  TROPONIN I (HIGH SENSITIVITY)  TROPONIN I (HIGH SENSITIVITY)    EKG EKG Interpretation  Date/Time:  Sunday July 12 2021 16:26:17 EST Ventricular Rate:  44 PR Interval:  178 QRS Duration: 100 QT Interval:  504 QTC Calculation: 430 R Axis:   104 Text Interpretation: Marked sinus bradycardia Rightward axis Low voltage QRS Abnormal ECG No significant change since last tracing Confirmed by Yao, David H ([id number]) on 07/12/2021 9:46:18 PM  Radiology DG Chest 2 View  Result Date: 07/12/2021 CLINICAL DATA:  Chest pain EXAM: CHEST - 2 VIEW COMPARISON:  03/15/2019 FINDINGS: Lungs are clear.  No pleural effusion or pneumothorax. The heart is normal in size. Visualized osseous structures are within normal limits. IMPRESSION: Normal chest radiographs. Electronically Signed   By: Sriyesh  Krishnan M.D.   On: 07/12/2021 20:44    Procedures Procedures   Medications Ordered in ED Medications - No data to display  ED Course  I have reviewed the triage vital signs and the nursing notes.  Pertinent labs & imaging results that were available during my care of the patient were reviewed by me and considered in my medical decision making (see chart for details).    MDM Rules/Calculators/A&P HEAR Score: 4                        50  year old male with an episode of self-limiting chest pain as above.  He is afebrile and hemodynamically stable.  Reports resolution of chest pain.  His EKG showed sinus bradycardia with no acute ischemic changes.  No signs of heart block. Patient w/o syncope. His hear score is 4, moderate risk.  Tropes normal x2.  Chest x-ray showed no acute cardiopulmonary process.  White count normal.  Low concern for infectious process.  Creatinine at baseline.  Electrolytes  reassuring.  He has no tearing pain to back, no severe hypertension, no pulse deficits on exam.  Low concern for aortic pathology.  He is not tachycardic or tachypneic.  No historical features concerning for PE.  Low concern for VTE.  Chest x-ray showed no interstitial edema.  No significant edema on exam.  Low concern for CHF exacerbation..  Work-up discussed with patient.  Low concern for ACS at this time.  Appropriate for discharge home with close follow-up with cardiology in the next week for updated cardiac testing.  He is agreeable with plan.  All questions answered.  Strict return precautions provided.  Final Clinical Impression(s) / ED Diagnoses Final diagnoses:  Nonspecific chest pain    Rx / DC Orders ED Discharge Orders     None        Idamae Lusher, MD 07/12/21 2153    Drenda Freeze, MD 07/12/21 2251

## 2021-07-12 NOTE — Discharge Instructions (Signed)
Please follow-up with your PCP in 2 to 3 days for reassessment and to discuss your recent ED visit.  He may benefit from a referral to a cardiologist for a stress test and an echo.  Return to the ED with any worsening chest pain, difficulty breathing, passing out.

## 2021-08-31 DIAGNOSIS — Z79899 Other long term (current) drug therapy: Secondary | ICD-10-CM | POA: Diagnosis not present

## 2021-08-31 DIAGNOSIS — F332 Major depressive disorder, recurrent severe without psychotic features: Secondary | ICD-10-CM | POA: Diagnosis not present

## 2021-08-31 DIAGNOSIS — G40909 Epilepsy, unspecified, not intractable, without status epilepticus: Secondary | ICD-10-CM | POA: Diagnosis not present

## 2021-08-31 DIAGNOSIS — B182 Chronic viral hepatitis C: Secondary | ICD-10-CM | POA: Diagnosis not present

## 2021-09-06 DIAGNOSIS — I10 Essential (primary) hypertension: Secondary | ICD-10-CM | POA: Diagnosis not present

## 2021-09-06 DIAGNOSIS — E039 Hypothyroidism, unspecified: Secondary | ICD-10-CM | POA: Diagnosis not present

## 2021-09-06 DIAGNOSIS — E7800 Pure hypercholesterolemia, unspecified: Secondary | ICD-10-CM | POA: Diagnosis not present

## 2021-09-12 DIAGNOSIS — M5416 Radiculopathy, lumbar region: Secondary | ICD-10-CM | POA: Diagnosis not present

## 2021-09-12 DIAGNOSIS — M5412 Radiculopathy, cervical region: Secondary | ICD-10-CM | POA: Diagnosis not present

## 2021-09-12 DIAGNOSIS — I10 Essential (primary) hypertension: Secondary | ICD-10-CM | POA: Diagnosis not present

## 2021-09-12 DIAGNOSIS — M542 Cervicalgia: Secondary | ICD-10-CM | POA: Diagnosis not present

## 2021-10-11 DIAGNOSIS — R768 Other specified abnormal immunological findings in serum: Secondary | ICD-10-CM | POA: Diagnosis not present

## 2021-10-20 ENCOUNTER — Observation Stay (HOSPITAL_COMMUNITY)
Admission: EM | Admit: 2021-10-20 | Discharge: 2021-10-21 | Discharge status: Against Medical Advice | Payer: Medicare Other | Attending: Emergency Medicine | Admitting: Emergency Medicine

## 2021-10-20 ENCOUNTER — Other Ambulatory Visit: Payer: Self-pay

## 2021-10-20 ENCOUNTER — Emergency Department (HOSPITAL_COMMUNITY): Payer: Medicare Other

## 2021-10-20 ENCOUNTER — Encounter (HOSPITAL_COMMUNITY): Payer: Self-pay

## 2021-10-20 DIAGNOSIS — R55 Syncope and collapse: Principal | ICD-10-CM | POA: Insufficient documentation

## 2021-10-20 DIAGNOSIS — Z91040 Latex allergy status: Secondary | ICD-10-CM | POA: Diagnosis not present

## 2021-10-20 DIAGNOSIS — F1120 Opioid dependence, uncomplicated: Secondary | ICD-10-CM | POA: Diagnosis present

## 2021-10-20 DIAGNOSIS — F1121 Opioid dependence, in remission: Secondary | ICD-10-CM | POA: Diagnosis not present

## 2021-10-20 DIAGNOSIS — G40909 Epilepsy, unspecified, not intractable, without status epilepticus: Secondary | ICD-10-CM

## 2021-10-20 DIAGNOSIS — Z20822 Contact with and (suspected) exposure to covid-19: Secondary | ICD-10-CM | POA: Diagnosis not present

## 2021-10-20 DIAGNOSIS — Z79899 Other long term (current) drug therapy: Secondary | ICD-10-CM | POA: Insufficient documentation

## 2021-10-20 DIAGNOSIS — F17200 Nicotine dependence, unspecified, uncomplicated: Secondary | ICD-10-CM | POA: Insufficient documentation

## 2021-10-20 DIAGNOSIS — F411 Generalized anxiety disorder: Secondary | ICD-10-CM | POA: Insufficient documentation

## 2021-10-20 DIAGNOSIS — R569 Unspecified convulsions: Secondary | ICD-10-CM | POA: Diagnosis not present

## 2021-10-20 DIAGNOSIS — F329 Major depressive disorder, single episode, unspecified: Secondary | ICD-10-CM | POA: Diagnosis present

## 2021-10-20 DIAGNOSIS — R9431 Abnormal electrocardiogram [ECG] [EKG]: Secondary | ICD-10-CM | POA: Diagnosis not present

## 2021-10-20 LAB — CBC WITH DIFFERENTIAL/PLATELET
Abs Immature Granulocytes: 0.05 10*3/uL (ref 0.00–0.07)
Basophils Absolute: 0.1 10*3/uL (ref 0.0–0.1)
Basophils Relative: 1 %
Eosinophils Absolute: 0.1 10*3/uL (ref 0.0–0.5)
Eosinophils Relative: 1 %
HCT: 44.4 % (ref 39.0–52.0)
Hemoglobin: 16 g/dL (ref 13.0–17.0)
Immature Granulocytes: 1 %
Lymphocytes Relative: 36 %
Lymphs Abs: 3.5 10*3/uL (ref 0.7–4.0)
MCH: 31.8 pg (ref 26.0–34.0)
MCHC: 36 g/dL (ref 30.0–36.0)
MCV: 88.3 fL (ref 80.0–100.0)
Monocytes Absolute: 0.7 10*3/uL (ref 0.1–1.0)
Monocytes Relative: 7 %
Neutro Abs: 5.4 10*3/uL (ref 1.7–7.7)
Neutrophils Relative %: 54 %
Platelets: 231 10*3/uL (ref 150–400)
RBC: 5.03 MIL/uL (ref 4.22–5.81)
RDW: 13 % (ref 11.5–15.5)
WBC: 9.8 10*3/uL (ref 4.0–10.5)
nRBC: 0 % (ref 0.0–0.2)

## 2021-10-20 LAB — CK: Total CK: 157 U/L (ref 49–397)

## 2021-10-20 LAB — TROPONIN I (HIGH SENSITIVITY)
Troponin I (High Sensitivity): 3 ng/L (ref <18)
Troponin I (High Sensitivity): 3 ng/L (ref <18)

## 2021-10-20 LAB — RESP PANEL BY RT-PCR (FLU A&B, COVID) ARPGX2
Influenza A by PCR: NEGATIVE
Influenza B by PCR: NEGATIVE
SARS Coronavirus 2 by RT PCR: NEGATIVE

## 2021-10-20 LAB — COMPREHENSIVE METABOLIC PANEL
ALT: 22 U/L (ref 0–44)
AST: 29 U/L (ref 15–41)
Albumin: 3.6 g/dL (ref 3.5–5.0)
Alkaline Phosphatase: 59 U/L (ref 38–126)
Anion gap: 7 (ref 5–15)
BUN: 16 mg/dL (ref 6–20)
CO2: 28 mmol/L (ref 22–32)
Calcium: 9.1 mg/dL (ref 8.9–10.3)
Chloride: 103 mmol/L (ref 98–111)
Creatinine, Ser: 1.13 mg/dL (ref 0.61–1.24)
GFR, Estimated: >60 mL/min (ref >60)
Glucose, Bld: 90 mg/dL (ref 70–99)
Potassium: 4.6 mmol/L (ref 3.5–5.1)
Sodium: 138 mmol/L (ref 135–145)
Total Bilirubin: 1.1 mg/dL (ref 0.3–1.2)
Total Protein: 6.9 g/dL (ref 6.5–8.1)

## 2021-10-20 LAB — RAPID URINE DRUG SCREEN, HOSP PERFORMED
Amphetamines: NOT DETECTED
Barbiturates: POSITIVE — AB
Benzodiazepines: NOT DETECTED
Cocaine: NOT DETECTED
Opiates: NOT DETECTED
Tetrahydrocannabinol: POSITIVE — AB

## 2021-10-20 LAB — URINALYSIS, ROUTINE W REFLEX MICROSCOPIC
Bilirubin Urine: NEGATIVE
Glucose, UA: NEGATIVE mg/dL
Ketones, ur: NEGATIVE mg/dL
Leukocytes,Ua: NEGATIVE
Nitrite: NEGATIVE
Protein, ur: 100 mg/dL — AB
RBC / HPF: >50 RBC/hpf — ABNORMAL HIGH (ref 0–5)
Specific Gravity, Urine: 1.015 (ref 1.005–1.030)
pH: 5 (ref 5.0–8.0)

## 2021-10-20 LAB — SALICYLATE LEVEL: Salicylate Lvl: <7 mg/dL — ABNORMAL LOW (ref 7.0–30.0)

## 2021-10-20 LAB — ETHANOL: Alcohol, Ethyl (B): <10 mg/dL (ref <10)

## 2021-10-20 LAB — MAGNESIUM: Magnesium: 1.9 mg/dL (ref 1.7–2.4)

## 2021-10-20 LAB — ACETAMINOPHEN LEVEL: Acetaminophen (Tylenol), Serum: <10 ug/mL — ABNORMAL LOW (ref 10–30)

## 2021-10-20 MED ORDER — ACETAMINOPHEN 650 MG RE SUPP
650.0000 mg | Freq: Four times a day (QID) | RECTAL | Status: DC | PRN
Start: 2021-10-20 — End: 2021-10-22

## 2021-10-20 MED ORDER — LORAZEPAM 2 MG/ML IJ SOLN
1.0000 mg | INTRAMUSCULAR | Status: DC | PRN
  Administered 2021-10-20: 1 mg via INTRAVENOUS
  Filled 2021-10-20: qty 1

## 2021-10-20 MED ORDER — LACTATED RINGERS IV BOLUS
1000.0000 mL | Freq: Once | INTRAVENOUS | Status: AC
  Administered 2021-10-20: 1000 mL via INTRAVENOUS

## 2021-10-20 MED ORDER — LORAZEPAM 2 MG/ML IJ SOLN
1.0000 mg | Freq: Once | INTRAMUSCULAR | Status: DC
  Filled 2021-10-20: qty 1

## 2021-10-20 MED ORDER — ACETAMINOPHEN 325 MG PO TABS: 650.0000 mg | ORAL_TABLET | Freq: Four times a day (QID) | ORAL | Status: DC | PRN

## 2021-10-20 NOTE — ED Notes (Addendum)
Pt has to leave due to childcare issues. Jaquita Folds MD made aware ?

## 2021-10-20 NOTE — ED Provider Notes (Signed)
?[name] EMERGENCY DEPART[facility] ?Provider Note ? ? ?CSN: [id number] ?Arrival date & time: 10/20/21  1256 ? ?  ? ?History ? ?Chief Complaint  ?Patient presents with  ? involuntary movements   ? generalized pain  ? ? ?[name] is a 51 y.o. male. ? ?HPI ?51 year old male with a history of drug abuse including alcohol, opiates, cocaine who states he is in remission as well as having a history of cirrhosis, chronic back pain, and prior seizures from alcohol withdrawal presents with shaking and passing out.  Patient and significant other indicate that he has been having passing out episodes multiple times a day.  They estimate 10 episodes.  Most recently was in triage.  Basically he will feel a humming sensation in his head and then very quickly pass out.  Feels like it gets dark.  Family states he is unresponsive for couple seconds and then when he wakes up he is confused and has slurred speech for several minutes.  After around 11:30 AM he was having uncontrolled shaking throughout his whole body though he was awake at this time.  That finally went away when he was given Ativan in triage.  He denies recent illness.  He has chronic headaches that are unchanged.  He has chronic right leg weakness from prior back complications but no new weakness.  He had some chest pain earlier while in triage though he states that went away and he does not think that was significant. Family notes he's had some progressive speech difficulty for 6+ months.  ? ?Home Medications ?Prior to Admission medications   ?Medication Sig Start Date End Date Taking? Authorizing Provider  ?Buprenorphine HCl-Naloxone HCl (SUBOXONE) 8-2 MG FILM Place 1 Film under the tongue 3 (three) times daily.    [provider]  ?buPROPion (WELLBUTRIN SR) 150 MG 12 hr tablet Take 150 mg by mouth 2 (two) times daily.    [provider]  ?butalbital-acetaminophen-caffeine (FIORICET) 50-325-40 MG tablet Take 1 tablet by mouth 2  (two) times daily as needed for headache.    [provider]  ?diclofenac Sodium (VOLTAREN) 1 % GEL Apply 4 g topically 4 (four) times daily. 01/07/21   Melene Plan, DO  ?gabapentin (NEURONTIN) 600 MG tablet Take 1 tablet (600 mg total) by mouth 3 (three) times daily. 09/01/18   Malvin Johns, MD  ?phentermine (ADIPEX-P) 37.5 MG tablet Take 37.5 mg by mouth daily before breakfast.    [provider]  ?   ? ?Allergies    ?Benadryl [diphenhydramine hcl], Celecoxib, Diphenhydramine, Prednisone, Risperidone and related, Nsaids, Other, Rofecoxib, Tolmetin, Trazodone and nefazodone, Ibuprofen, Latex, and Paliperidone palmitate er   ? ?Review of Systems   ?Review of Systems  ?Constitutional:  Negative for fever.  ?Respiratory:  Negative for shortness of breath.   ?Cardiovascular:  Positive for chest pain.  ?Gastrointestinal:  Negative for abdominal pain.  ?Neurological:  Positive for syncope and headaches. Negative for numbness.  ? ?Physical Exam ?Updated Vital Signs ?BP (!) 151/95   Pulse (!) 50   Temp 97.8 ?F (36.6 ?C) (Oral)   Resp 13   SpO2 98%  ?Physical Exam ?Vitals and nursing note reviewed.  ?Constitutional:   ?   General: He is not in acute distress. ?   Appearance: He is well-developed. He is obese. He is not ill-appearing or diaphoretic.  ?HENT:  ?   Head: Normocephalic and atraumatic.  ?   Mouth/Throat:  ?   Comments: No tongue injury ?Eyes:  ?  Extraocular Movements: Extraocular movements intact.  ?   Pupils: Pupils are equal, round, and reactive to light.  ?Cardiovascular:  ?   Rate and Rhythm: Normal rate and regular rhythm.  ?   Heart sounds: Normal heart sounds.  ?Pulmonary:  ?   Effort: Pulmonary effort is normal.  ?   Breath sounds: Normal breath sounds.  ?Abdominal:  ?   Palpations: Abdomen is soft.  ?   Tenderness: There is no abdominal tenderness.  ?Skin: ?   General: Skin is warm and dry.  ?Neurological:  ?   Mental Status: He is alert and oriented to person, place, and time.  ?    Comments: CN 3-12 grossly intact. 5/5 strength in both upper extremities. Mildly weak in RLE compared to left, partially due to pain it causes. Normal finger to nose. No tremors.  ? ? ?ED Results / Procedures / Treatments   ?Labs ?(all labs ordered are listed, but only abnormal results are displayed) ?Labs Reviewed  ?URINALYSIS, ROUTINE W REFLEX MICROSCOPIC - Abnormal; Notable for the following components:  ?    Result Value  ? APPearance HAZY (*)   ? Hgb urine dipstick LARGE (*)   ? Protein, ur 100 (*)   ? RBC / HPF >50 (*)   ? Bacteria, UA RARE (*)   ? All other components within normal limits  ?RAPID URINE DRUG SCREEN, HOSP PERFORMED - Abnormal; Notable for the following components:  ? Tetrahydrocannabinol POSITIVE (*)   ? Barbiturates POSITIVE (*)   ? All other components within normal limits  ?ACETAMINOPHEN LEVEL - Abnormal; Notable for the following components:  ? Acetaminophen (Tylenol), Serum <10 (*)   ? All other components within normal limits  ?SALICYLATE LEVEL - Abnormal; Notable for the following components:  ? Salicylate Lvl <7.0 (*)   ? All other components within normal limits  ?RESP PANEL BY RT-PCR (FLU A&B, COVID) ARPGX2  ?COMPREHENSIVE METABOLIC PANEL  ?CBC WITH DIFFERENTIAL/PLATELET  ?MAGNESIUM  ?CK  ?ETHANOL  ?PROLACTIN  ?MAGNESIUM  ?COMPREHENSIVE METABOLIC PANEL  ?CBC WITH DIFFERENTIAL/PLATELET  ?TROPONIN I (HIGH SENSITIVITY)  ?TROPONIN I (HIGH SENSITIVITY)  ? ? ?EKG ?EKG Interpretation ? ?Date/Time:  Tuesday October 20 2021 15:48:35 EDT ?Ventricular Rate:  51 ?PR Interval:  185 ?QRS Duration: 124 ?QT Interval:  451 ?QTC Calculation: 416 ?R Axis:   83 ?Text Interpretation: Sinus rhythm Nonspecific intraventricular conduction delay Probable anteroseptal infarct, old Confirmed by Pricilla LovelessGoldston, Raileigh Sabater [phone]([id number]) on 10/20/2021 3:57:42 PM ? ?Radiology ?CT Head Wo Contrast ? ?Result Date: 10/20/2021 ?CLINICAL DATA:  New onset seizures.  No trauma. EXAM: CT HEAD WITHOUT CONTRAST TECHNIQUE: Contiguous axial images  were obtained from the base of the skull through the vertex without intravenous contrast. RADIATION DOSE REDUCTION: This exam was performed according to the departmental dose-optimization program which includes automated exposure control, adjustment of the mA and/or kV according to patient size and/or use of iterative reconstruction technique. COMPARISON:  01/26/2014 FINDINGS: Brain: No evidence of acute infarction, hemorrhage, hydrocephalus, extra-axial collection or mass lesion/mass effect. Vascular: No hyperdense vessel or unexpected calcification. Skull: Calvarium appears intact. Sinuses/Orbits: Retention cysts in the maxillary antra. No acute air-fluid levels in the paranasal sinuses. Mastoid air cells are clear. Other: None. IMPRESSION: No acute intracranial abnormalities. Electronically Signed   By: Burman NievesWilliam  Stevens M.D.   On: 10/20/2021 19:08   ? ?Procedures ?Procedures  ? ? ?Medications Ordered in ED ?Medications  ?LORazepam (ATIVAN) injection 1 mg (2 mg Intramuscular Incomplete 10/20/21 1329)  ?acetaminophen (TYLENOL) tablet 650  mg (has no administration in time range)  ?  Or  ?acetaminophen (TYLENOL) suppository 650 mg (has no administration in time range)  ?LORazepam (ATIVAN) injection 1 mg (has no administration in time range)  ?lactated ringers bolus 1,000 mL (0 mLs Intravenous Stopped 10/20/21 1931)  ? ? ?ED Course/ Medical Decision Making/ A&P ?  ?                        ?Medical Decision Making ?Amount and/or Complexity of Data Reviewed ?Labs: ordered. ?Radiology: ordered. ? ?Risk ?Decision regarding hospitalization. ? ? ?Patient was given Ativan prior to me seeing him.  Thus I have not seen any tremors or seizure-like activity.  His presentation is atypical though with the confusion he is having afterwards I am a little worried that he is having seizure-like symptoms.  Discussed with neurology after our work-up, Dr. Derry Lory, who has consulted and evaluated patient and recommends admission for  continuous EEG.  Discussed with Dr. Arlean Hopping for admission. ? ?I personally interpreted the labs which show no significant electrolyte disturbance.  ECG is unremarkable on my interpretation.  CT head images vi

## 2021-10-20 NOTE — ED Provider Triage Note (Signed)
Emergency Medicine Provider Triage Evaluation Note ? ?[name] , a 51 y.o. male  was evaluated in triage.  Pt complains of involuntary movements.  He states that he the past few hours has been having shaking and body pain.  He denies any history of similar.  He denies any drug use or medication changes. ?He states he feels generally unsettled. ? ?He feels like the shaking is more severe on the right side than on the left. ? ? ?Physical Exam  ?BP (!) 153/110 (BP Location: Left Arm)   Pulse 63   Temp 98.4 ?F (36.9 ?C) (Oral)   Resp (!) 26   SpO2 100%  ?Gen:   Awake,  ?Resp:  Normal effort  ?MSK:   Patient with diffuse body wide tremors bilaterally, has pain when attempting to lift legs or arms but is able to.  ?Other:  Patient is awake and alert.  He is conscious and able to speak normally.  Pupils are slightly constricted however are symmetric bilaterally. ? ?Medical Decision Making  ?Medically screening exam initiated at 1:27 PM.  Appropriate orders placed.  [name] was informed that the remainder of the evaluation will be completed by another provider, this initial triage assessment does not replace that evaluation, and the importance of remaining in the ED until their evaluation is complete. ? ?Patient with general body wide tremors.  He is awake and alert with normal speech.  He does report pain when attempting to follow movement commands. ?We will treat with Ativan.  He denies drug use. ?He does report chest pain, at this point EKG wound not be helpful as it would be pure artifact and not provide meaningful information due to his underlying tremor.  EKG is ordered with instructions to wait until his tremor subsides. ? ?Patient discussed with Dr. Jeraldine Loots.  ? ? ? ?  ?Cristina Gong, [place] ?10/20/21 1335 ? ?

## 2021-10-20 NOTE — Consult Note (Addendum)
NEUROLOGY CONSULTATION NOTE  ? ?Date of service: October 20, 2021 ?Patient Name: Scott [name] ?MRN:  [id number] ?DOB:  03/26/1971 ?Reason for consult: "several episodes of unresponsiveness and jerks today" ?Requesting Provider: Pricilla LovelessGoldston, Scott, MD ?_ _ _   _ __   _ __ _ _  __ __   _ __   __ _ ? ?History of Present Illness  ?Scott [name] is a [age] y.o. male with PMH significant for anxiety, GERD, prior EtOh use(clean for 3 years), prior history of polysubstance use(clean for 3 years), prior alcohol withdrawal seizures, who presents today for several episodes of loss of consciousness. ? ?Reports that these episodes have been going on for about 2+ years. No clear triggers. Out of nowhere, he will have brief whooshing sound in the top of his head, followed by loos of consicousness for a split second and then he is confused for 10 mins. Sometimes, he has twitching of his eyelids and following the episode, his speech is slurred. They were happening about once every 3-4 months when they initially started and so he did not think too much of it but are happening about 10 times a day now. Felt he had these back to back today so he came to the ED. ? ?Reports history of significant head injury when he was in the [employer] requiring several stitches to his scalp, had significant history of alcohol intake and was drinking half a gallon + of liquor until he quit about 3 years ago. Also reports that he had a couple alcohol withdrawal seizures. ? ?He denies any fever, reports some sorethroat over the last few days, endorses urinary dribbling with some pain. No symptoms of gastroenteritis. ? ?He takes wellbutrin daily. Wife sets up all of his meds including his suboxone and gabapentin. ? ?He also reports having occasional sharp stabbing pain in the back of his head that only last 20-30 secs. They are disabling. These have been occurring since 1990s. He also reports having migraine headaches with nausea + vomiting for which he takes  fioricet as neeed. ? ?CTH w/o contrast with no acute intracranial abnormalities. Vitals normal. Chemistry with no electrolyte derangement. UA not concerning for a UTI. UDS positive for marijuana and barbiturates but he is on Fiorecet. He reports taking CBD gummies and attributes positive marijuana to it. ? ?  ?ROS  ? ?Constitutional Denies weight loss, fever and chills.   ?HEENT Denies changes in vision and hearing.   ?Respiratory Denies SOB and cough.   ?CV Denies palpitations and CP   ?GI Denies abdominal pain, nausea, vomiting and diarrhea.   ?GU +  dysuria but no urinary frequency.   ?MSK Denies myalgia and joint pain.   ?Skin Denies rash and pruritus.   ?Neurological +  headache but no syncope.   ?Psychiatric Denies recent changes in mood. Denies anxiety and depression.   ? ?Past History  ? ?Past Medical History:  ?Diagnosis Date  ? Accidental heroin overdose (HCC)   ? Anxiety   ? Back pain   ? Bipolar 1 disorder (HCC)   ? Chronic back pain   ? Depression   ? Drug-seeking behavior   ? GERD (gastroesophageal reflux disease)   ? Heroin abuse (HCC)   ? History of ETOH abuse   ? Myocardial infarction [facility](HCC)   ? Pt says that the doctor determined it was panic attacks  ? Opioid dependence (HCC)   ? PTSD (post-traumatic stress disorder)   ? Seizures (HCC)   ?  alcohol induced- pt reports sober for 6 months  ? ?Past Surgical History:  ?Procedure Laterality Date  ? ABDOMINAL SURGERY    ? APPENDECTOMY    ? BACK SURGERY    ? ?Family History  ?Problem Relation Age of Onset  ? Hypertension Mother   ? CAD Father   ? COPD Father   ? Stroke Father   ? Testicular cancer Brother   ? ?Social History  ? ?Socioeconomic History  ? Marital status: Married  ?  Spouse name: Not on file  ? Number of children: Not on file  ? Years of education: Not on file  ? Highest education level: Not on file  ?Occupational History  ? Occupation: disability  ?Tobacco Use  ? Smoking status: Every Day  ?  Packs/day: 1.50  ?  Types: Cigarettes  ? Smokeless  tobacco: Never  ? Tobacco comments:  ?  vaping, wenaed down to 5 mg; would like a patch  ?Vaping Use  ? Vaping Use: Never used  ?Substance and Sexual Activity  ? Alcohol use: Not Currently  ?  Comment: Pt reports sober for 6 months  ? Drug use: Yes  ?  Types: Methamphetamines, Marijuana, Cocaine  ?  Comment: cocaine about about a month ago  ? Sexual activity: Yes  ?  Birth control/protection: None  ?  Comment: heroin  ?Other Topics Concern  ? Not on file  ?Social History Narrative  ? Not on file  ? ?Social Determinants of Health  ? ?Financial Resource Strain: Not on file  ?Food Insecurity: Not on file  ?Transportation Needs: Not on file  ?Physical Activity: Not on file  ?Stress: Not on file  ?Social Connections: Not on file  ? ?Allergies  ?Allergen Reactions  ? Benadryl [Diphenhydramine Hcl] Anaphylaxis and Other (See Comments)  ?  Muscles lock up.  ? Celecoxib Diarrhea, Rash and Other (See Comments)  ?  Nose bleeds, bloating, migraines ?  ? Diphenhydramine Anaphylaxis  ?  Hard time breathing, convulsions,  ? Prednisone Other (See Comments)  ?  Severe Anger  ? Risperidone And Related Other (See Comments)  ?  Overly sedates  ? Nsaids Diarrhea, Rash and Other (See Comments)  ?  Nose bleeds, bloating, migraines  ? Other Other (See Comments)  ?  STEROIDS--caused anger,irritation  ? Rofecoxib Diarrhea, Rash and Other (See Comments)  ?  Nose bleeds, bloating, migraines  ? Tolmetin Diarrhea, Rash and Other (See Comments)  ?  Nose bleeds, bloating, migraines ?  ? Trazodone And Nefazodone Other (See Comments)  ?  headaches  ? Ibuprofen Rash  ? Latex Itching, Rash and Other (See Comments)  ?  Skin cracks  ? Paliperidone Palmitate Er Other (See Comments)  ?  Overly sedates  ? ? ?Medications  ?(Not in a hospital admission) ?  ? ?Vitals  ? ?Vitals:  ? 10/20/21 1730 10/20/21 1745 10/20/21 1800 10/20/21 1930  ?BP: (!) 131/92 127/84 127/85 (!) 141/96  ?Pulse: (!) 47 (!) 48 (!) 46 (!) 47  ?Resp: (!) 22 12 12 14   ?Temp:       ?TempSrc:      ?SpO2: 93% 95% 94% 96%  ?  ? ?There is no height or weight on file to calculate BMI. ? ?Physical Exam  ? ?General: Laying comfortably in bed; in no acute distress.  ?HENT: Normal oropharynx and mucosa. Normal external appearance of ears and nose.  ?Neck: Supple, no pain or tenderness  ?CV: No JVD. No peripheral edema.  ?Pulmonary: Symmetric  Chest rise. Normal respiratory effort.  ?Abdomen: Soft to touch, non-tender.  ?Ext: No cyanosis, edema, or deformity  ?Skin: No rash. Normal palpation of skin.   ?Musculoskeletal: Normal digits and nails by inspection. No clubbing.  ? ?Neurologic Examination  ?Mental status/Cognition: Alert, oriented to self, place, month and year, good attention.  ?Speech/language: Fluent, comprehension intact, object naming intact, repetition intact.  ?Cranial nerves:  ? CN II Pupils equal and reactive to light, no VF deficits   ? CN III,IV,VI EOM intact, no gaze preference or deviation, no nystagmus   ? CN V normal sensation in V1, V2, and V3 segments bilaterally   ? CN VII no asymmetry, no nasolabial fold flattening   ? CN VIII normal hearing to speech   ? CN IX & X normal palatal elevation, no uvular deviation   ? CN XI 5/5 head turn and 5/5 shoulder shrug bilaterally   ? CN XII midline tongue protrusion   ? ?Motor:  ?Muscle bulk: normal, tone normal, pronator drift none tremor none ?Mvmt Root Nerve  Muscle Right Left Comments  ?SA C5/6 Ax Deltoid 5 5   ?EF C5/6 Mc Biceps 5 5   ?EE C6/7/8 Rad Triceps 5 5   ?WF C6/7 Med FCR     ?WE C7/8 PIN ECU     ?F Ab C8/T1 U ADM/FDI 5 5   ?HF L1/2/3 Fem Illopsoas 4+ 5 Giveaway weakness in R Hip flexion and R knee extension probably due to pain.  ?KE L2/3/4 Fem Quad 4+ 5   ?DF L4/5 D Peron Tib Ant 5 5   ?PF S1/2 Tibial Grc/Sol 5 5   ? ?Reflexes: ? Right Left Comments  ?Pectoralis     ? Biceps (C5/6) 2 2   ?Brachioradialis (C5/6) 2 2   ? Triceps (C6/7) 2 2   ? Patellar (L3/4) 2 2   ? Achilles (S1) 1 1   ? Hoffman     ? Plantar     ?Jaw jerk    ? ?Sensation: ? Light touch Decreased in a band like distribution on the outer aspect of his R leg extending from his hip down to his R foot. This is chronic.  ? Pin prick   ? Temperature   ? Vibration   ?Propr

## 2021-10-20 NOTE — ED Notes (Signed)
Pt reported having increased tremors and then loss of consciousness about 10 minutes ago. Pt remembers watching something on his phone and then having increased tremors in both arms and then abruptly waking up. Pt axo x4 and continues to have mild tremors. PRN ativan given  ?

## 2021-10-20 NOTE — ED Triage Notes (Signed)
Pt. Stated, I was just sitting down and all of sudden I started having jerking pain all over and I cant control it. I passed out and come back. I did this about 10 times.  ?

## 2021-10-21 ENCOUNTER — Observation Stay (HOSPITAL_COMMUNITY): Payer: Medicare Other

## 2021-10-21 ENCOUNTER — Encounter (HOSPITAL_COMMUNITY): Payer: Self-pay | Admitting: Internal Medicine

## 2021-10-21 DIAGNOSIS — F411 Generalized anxiety disorder: Secondary | ICD-10-CM | POA: Diagnosis not present

## 2021-10-21 DIAGNOSIS — F320 Major depressive disorder, single episode, mild: Secondary | ICD-10-CM

## 2021-10-21 DIAGNOSIS — F1120 Opioid dependence, uncomplicated: Secondary | ICD-10-CM

## 2021-10-21 DIAGNOSIS — R4182 Altered mental status, unspecified: Secondary | ICD-10-CM

## 2021-10-21 DIAGNOSIS — G40909 Epilepsy, unspecified, not intractable, without status epilepticus: Secondary | ICD-10-CM

## 2021-10-21 DIAGNOSIS — R55 Syncope and collapse: Secondary | ICD-10-CM | POA: Diagnosis not present

## 2021-10-21 DIAGNOSIS — R569 Unspecified convulsions: Secondary | ICD-10-CM | POA: Diagnosis not present

## 2021-10-21 MED ORDER — BUPRENORPHINE HCL-NALOXONE HCL 8-2 MG SL SUBL
1.0000 | SUBLINGUAL_TABLET | Freq: Every day | SUBLINGUAL | Status: DC
  Administered 2021-10-21: 1 via SUBLINGUAL
  Filled 2021-10-21: qty 1

## 2021-10-21 MED ORDER — NICOTINE 14 MG/24HR TD PT24
14.0000 mg | MEDICATED_PATCH | Freq: Every day | TRANSDERMAL | Status: DC
  Administered 2021-10-21: 14 mg via TRANSDERMAL
  Filled 2021-10-21: qty 1

## 2021-10-21 MED ORDER — LIDOCAINE 5 % EX PTCH
1.0000 | MEDICATED_PATCH | Freq: Every day | CUTANEOUS | Status: DC
  Administered 2021-10-21: 1 via TRANSDERMAL
  Filled 2021-10-21: qty 1

## 2021-10-21 NOTE — Progress Notes (Addendum)
Subjective: had 2 episodes so far.  ? ?ROS: negative except above ? ?Examination ? ?Vital signs in last 24 hours: ?Temp:  [97.4 ?F (36.3 ?C)-97.8 ?F (36.6 ?C)] 97.7 ?F (36.5 ?C) (03/15 1200) ?Pulse Rate:  [46-62] 56 (03/15 1200) ?Resp:  [12-26] 16 (03/15 1200) ?BP: (114-151)/(73-97) 132/85 (03/15 1200) ?SpO2:  [92 %-98 %] 94 % (03/15 1200) ? ?General: lying in bed, NAD ?Neuro: Aox3, no aphasia. Moving all extremities symmetrically in bed. ? ?Basic Metabolic Panel: ?Recent Labs  ?Lab 10/20/21 ?1336  ?NA 138  ?K 4.6  ?CL 103  ?CO2 28  ?GLUCOSE 90  ?BUN 16  ?CREATININE 1.13  ?CALCIUM 9.1  ?MG 1.9  ? ? ?CBC: ?Recent Labs  ?Lab 10/20/21 ?1336  ?WBC 9.8  ?NEUTROABS 5.4  ?HGB 16.0  ?HCT 44.4  ?MCV 88.3  ?PLT 231  ? ? ? ?Coagulation Studies: ?No results for input(s): LABPROT, INR in the last 72 hours. ? ?Imaging ? ?CT head without contrast 10/20/2021: No acute intracranial abnormalities ? ?ASSESSMENT AND PLAN: 51 year old male with transient episodes of alteration of awareness. ? ?Transient alteration of awareness ?-No ictal-interictal activity overnight ? ?Recommendations ?- Continue video EEG monitoring ?-Hold off on starting any antiepileptics unless we see any abnormality on EEG ?-Continue seizure precautions ?-As needed IV Ativan 2 mg for clinical seizure-like activity ?-Management of rest of comorbidities per primary team ? ?ADDENDUM ?-Patient requesting to leave tonight as he needs childcare. Unfortunately, we have not completed testing yet. ?- patient understands this and till wishes to leave. ?- Patient states episodes happen more when he is looking at screen and reports increased stress due to 3 year old kid, on parole and health issues.  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 ?I have spent a total of  36 minutes with the patient reviewing hospital notes,  test results, labs and examining the patient as well as establishing an assessment and plan that was discussed personally with the patient and wife at bedside.  > 50% of time was spent in direct patient care. ?  ? ?Lindie Spruce ?Epilepsy ?[facility] ?For questions after 5pm please refer to AMION to reach the Neurologist on call ? ?

## 2021-10-21 NOTE — Care Management Obs Status (Signed)
MEDICARE OBSERVATION STATUS NOTIFICATION ? ? ?Patient Details  ?Name: Scott [name] ?MRN: [id number] ?Date of Birth: [birth year] ? ? ?Medicare Observation Status Notification Given:  Yes ? ? ? ?Cyndi Bender, RN ?10/21/2021, 4:23 PM ?

## 2021-10-21 NOTE — H&P (Addendum)
History and Physical    PLEASE NOTE THAT DRAGON DICTATION SOFTWARE WAS USED IN THE CONSTRUCTION OF THIS NOTE.   MATIS DEMICK ZOX:[id number] DOB: [birth year] DOA: 10/20/2021  PCP: Chiquita Loth, PA  Patient coming from: home   I have personally briefly reviewed patient's old medical records in [facility]  Chief Complaint: Syncope  HPI: Scott [name] is a 51 y.o. male with medical history significant for generalized anxiety disorder, chronic pain syndrome on Suboxone, chronic tobacco abuse, who is admitted to [facility] on 10/20/2021 for further evaluation of syncopal episodes after presenting from home to [facility] ED for evaluation of such.   Over the last 1 to 2 days, the patient has experienced at least 6-7 episodes of loss of consciousness without preceding dizziness, palpitations, diaphoresis, or impending sensation of loss of consciousness.  Majority of these episodes have been witnessed by the patient's wife, who conveys that the duration of patient's unconsciousness is around 5 to 20 seconds, without any episodes of significantly longer duration.  As the patient regains consciousness, she reports that the patient appears confused with some slurring of speech for approximately 1 to 2 minutes, before complete resolution of slurring of speech concomitant with resolution of confusion, with return to baseline mental status.  These episodes are not associated with any tongue biting, nor any loss of bowel/bladder function.  These episodes occur while at rest, and without any associated positional changes.  No reported associated tonic-clonic activity.  Patient denies any associated, preceding, or ensuing chest discomfort, shortness of breath, diaphoresis, nausea, vomiting.  No recent preceding trauma.  Denies any subjective fevers, chills, rigors, or generalized myalgias.  No recent headache, neck stiffness, rash, cough, abdominal pain, diarrhea, or dysuria.  He conveys a history of  seizures, but states that these prior seizures were always related to alcohol withdrawal.  He acknowledges a history of alcohol abuse as well as recreational drug use, including use of cocaine as well as heroin.  However, he reports that he has not consumed any alcohol nor used any recreational drugs for several months now, conveying his concern regarding these potential seizure episodes in the absence of any alcohol withdrawal or provocation by recreational drugs or their inherent withdrawal.   Outpatient medications notable for Wellbutrin.  Additionally, in the setting of generalized anxiety disorder, the patient also takes atenolol.  He has a history of chronic pain syndrome, for which she is on Suboxone as an outpatient.  Denies any associated acute focal weakness, acute focal numbness, paresthesias, facial droop, slurred speech, expressive aphasia, acute change in vision, dysphagia, vertigo.  Denies any history of underlying coronary artery disease, nor any known history of arrhythmia, including accessory pathways.  No recent calf tenderness or new lower extremity erythema.  Denies any personal history of DVT or PE.     ED Course:  Vital signs in the ED were notable for the following: Afebrile; heart rate 53-56; systolic blood pressure noted to be in the 120s to 140s; respiratory rate 14-22, oxygen saturation 95 to 100% on room air.  Labs were notable for the following: CMP notable for the following: Sodium 138, creatinine 1.13, liver enzymes within normal limits.  Serum magnesium level 1.9.  High-sensitivity troponin I x2 values were both found to be 3.  CPK 157.  CBC notable for white blood cell count 9800.  Acetaminophen/serum salicylate levels nonelevated.  Serum ethanol level less than 10.  Urinary drug screen was positive for THC as well as barbiturates.  COVID-19/influenza PCR negative.  Imaging and additional notable ED work-up: EKG shows sinus rhythm with heart rate 51, with nonspecific  intraventricular conduction delay and QTc 416, while demonstrating nonspecific T wave inversion in leads III as well as V3, and no evidence of ST changes, including no evidence of ST elevation.  Noncontrast CT head showed no evidence of acute intracranial process, including no evidence of intracranial hemorrhage.  Dr.Khaliqdina of Neurology has been consulted, and is initiating continuous EEG monitoring to evaluate for any underlying contributory seizures.   While in the ED, the following were administered: LR bolus x1 L, Ativan 1 mg IV x1  Subsequently, the patient was admitted for overnight observation for further evaluation management of presenting syncopal episodes.      Review of Systems: As per HPI otherwise 10 point review of systems negative.   Past Medical History:  Diagnosis Date   Accidental heroin overdose (HCC)    Anxiety    Back pain    Bipolar 1 disorder (HCC)    Chronic back pain    Depression    Drug-seeking behavior    GERD (gastroesophageal reflux disease)    Heroin abuse (HCC)    History of ETOH abuse    Myocardial infarction (HCC)    Pt says that the doctor determined it was panic attacks   Opioid dependence (HCC)    PTSD (post-traumatic stress disorder)    Seizures (HCC)    alcohol induced- pt reports sober for 6 months    Past Surgical History:  Procedure Laterality Date   ABDOMINAL SURGERY     APPENDECTOMY     BACK SURGERY      Social History:  reports that he has been smoking cigarettes. He has been smoking an average of 1.5 packs per day. He has never used smokeless tobacco. He reports that he does not currently use alcohol. He reports current drug use. Drugs: Methamphetamines, Marijuana, and Cocaine.   Allergies  Allergen Reactions   Benadryl [Diphenhydramine Hcl] Anaphylaxis and Other (See Comments)    Muscles lock up.   Celecoxib Diarrhea, Rash and Other (See Comments)    Nose bleeds, bloating, migraines    Diphenhydramine Anaphylaxis     Hard time breathing, convulsions,   Prednisone Other (See Comments)    Severe Anger   Risperidone And Related Other (See Comments)    Overly sedates   Nsaids Diarrhea, Rash and Other (See Comments)    Nose bleeds, bloating, migraines   Other Other (See Comments)    STEROIDS--caused anger,irritation   Rofecoxib Diarrhea, Rash and Other (See Comments)    Nose bleeds, bloating, migraines   Tolmetin Diarrhea, Rash and Other (See Comments)    Nose bleeds, bloating, migraines    Trazodone And Nefazodone Other (See Comments)    headaches   Ibuprofen Rash   Latex Itching, Rash and Other (See Comments)    Skin cracks   Paliperidone Palmitate Er Other (See Comments)    Overly sedates    Family History  Problem Relation Age of Onset   Hypertension Mother    CAD Father    COPD Father    Stroke Father    Testicular cancer Brother     Family history reviewed and not pertinent    Prior to Admission medications   Medication Sig Start Date End Date Taking? Authorizing Provider  atenolol (TENORMIN) 50 MG tablet Take 50 mg by mouth daily. 10/11/21  Yes [provider]  Buprenorphine HCl-Naloxone HCl 8-2 MG FILM Place 1  Film under the tongue 3 (three) times daily.   Yes [provider]  buPROPion (WELLBUTRIN XL) 300 MG 24 hr tablet Take 300 mg by mouth 2 (two) times daily. 10/11/21  Yes [provider]  furosemide (LASIX) 20 MG tablet Take 20-40 mg by mouth daily as needed for fluid. 09/21/21  Yes [provider]  gabapentin (NEURONTIN) 400 MG capsule Take 400 mg by mouth 3 (three) times daily. 10/11/21  Yes [provider]  phentermine (ADIPEX-P) 37.5 MG tablet Take 37.5 mg by mouth daily before breakfast.   Yes [provider]  Vitamin D, Ergocalciferol, (DRISDOL) 1.25 MG (50000 UNIT) CAPS capsule Take 50,000 Units by mouth once a week. 06/10/21  Yes [provider]  zolpidem (AMBIEN) 5 MG tablet Take 5 mg by mouth at bedtime as  needed for sleep. 10/11/21  Yes [provider]  buPROPion (WELLBUTRIN SR) 150 MG 12 hr tablet Take 150 mg by mouth 2 (two) times daily. Patient not taking: Reported on 10/20/2021    [provider]  butalbital-acetaminophen-caffeine (FIORICET) 50-325-40 MG tablet Take 1 tablet by mouth 2 (two) times daily as needed for headache. Patient not taking: Reported on 10/20/2021    [provider]  diclofenac Sodium (VOLTAREN) 1 % GEL Apply 4 g topically 4 (four) times daily. Patient not taking: Reported on 10/20/2021 01/07/21   Melene Plan, DO  gabapentin (NEURONTIN) 600 MG tablet Take 1 tablet (600 mg total) by mouth 3 (three) times daily. Patient not taking: Reported on 10/20/2021 09/01/18   Malvin Johns, MD  lisinopril (ZESTRIL) 10 MG tablet Take 10 mg by mouth daily. Patient not taking: Reported on 10/20/2021 09/12/21   [provider]     Objective    Physical Exam: Vitals:   10/20/21 2045 10/20/21 2115 10/20/21 2145 10/21/21 0620  BP: (!) 138/97 122/83 (!) 151/95 131/73  Pulse: (!) 53 (!) 50 (!) 50 (!) 52  Resp: 15 16 13 14   Temp:    (!) 97.5 F (36.4 C)  TempSrc:    Oral  SpO2: 97% 94% 98% 94%    General: appears to be stated age; alert, oriented Skin: warm, dry, no rash Head:  AT/[place] Mouth:  Oral mucosa membranes appear moist, normal dentition Neck: supple; trachea midline Heart:  RRR; did not appreciate any M/R/G Lungs: CTAB, did not appreciate any wheezes, rales, or rhonchi Abdomen: + BS; soft, ND, NT Vascular: 2+ pedal pulses b/l; 2+ radial pulses b/l Extremities: no peripheral edema, no muscle wasting Neuro: Neuro: 5/5 strength of the proximal and distal flexors and extensors of the upper and lower extremities bilaterally; sensation intact in upper and lower extremities b/l; cranial nerves II through XII grossly intact; no pronator drift; no evidence suggestive of slurred speech, dysarthria, or facial droop; Normal muscle tone. No  tremors.    Labs on Admission: I have personally reviewed following labs and imaging studies  CBC: Recent Labs  Lab 10/20/21 1336  WBC 9.8  NEUTROABS 5.4  HGB 16.0  HCT 44.4  MCV 88.3  PLT 231   Basic Metabolic Panel: Recent Labs  Lab 10/20/21 1336  NA 138  K 4.6  CL 103  CO2 28  GLUCOSE 90  BUN 16  CREATININE 1.13  CALCIUM 9.1  MG 1.9   GFR: CrCl cannot be calculated (Unknown ideal weight.). Liver Function Tests: Recent Labs  Lab 10/20/21 1336  AST 29  ALT 22  ALKPHOS 59  BILITOT 1.1  PROT 6.9  ALBUMIN 3.6  No results for input(s): LIPASE, AMYLASE in the last 168 hours. No results for input(s): AMMONIA in the last 168 hours. Coagulation Profile: No results for input(s): INR, PROTIME in the last 168 hours. Cardiac Enzymes: Recent Labs  Lab 10/20/21 1336  CKTOTAL 157   BNP (last 3 results) No results for input(s): PROBNP in the last 8760 hours. HbA1C: No results for input(s): HGBA1C in the last 72 hours. CBG: No results for input(s): GLUCAP in the last 168 hours. Lipid Profile: No results for input(s): CHOL, HDL, LDLCALC, TRIG, CHOLHDL, LDLDIRECT in the last 72 hours. Thyroid Function Tests: No results for input(s): TSH, T4TOTAL, FREET4, T3FREE, THYROIDAB in the last 72 hours. Anemia Panel: No results for input(s): VITAMINB12, FOLATE, FERRITIN, TIBC, IRON, RETICCTPCT in the last 72 hours. Urine analysis:    Component Value Date/Time   COLORURINE YELLOW 10/20/2021 1327   APPEARANCEUR HAZY (A) 10/20/2021 1327   LABSPEC 1.015 10/20/2021 1327   PHURINE 5.0 10/20/2021 1327   GLUCOSEU NEGATIVE 10/20/2021 1327   HGBUR LARGE (A) 10/20/2021 1327   BILIRUBINUR NEGATIVE 10/20/2021 1327   KETONESUR NEGATIVE 10/20/2021 1327   PROTEINUR 100 (A) 10/20/2021 1327   UROBILINOGEN 0.2 10/22/2013 1004   NITRITE NEGATIVE 10/20/2021 1327   LEUKOCYTESUR NEGATIVE 10/20/2021 1327    Radiological Exams on Admission: CT Head Wo Contrast  Result Date:  10/20/2021 CLINICAL DATA:  New onset seizures.  No trauma. EXAM: CT HEAD WITHOUT CONTRAST TECHNIQUE: Contiguous axial images were obtained from the base of the skull through the vertex without intravenous contrast. RADIATION DOSE REDUCTION: This exam was performed according to the departmental dose-optimization program which includes automated exposure control, adjustment of the mA and/or kV according to patient size and/or use of iterative reconstruction technique. COMPARISON:  01/26/2014 FINDINGS: Brain: No evidence of acute infarction, hemorrhage, hydrocephalus, extra-axial collection or mass lesion/mass effect. Vascular: No hyperdense vessel or unexpected calcification. Skull: Calvarium appears intact. Sinuses/Orbits: Retention cysts in the maxillary antra. No acute air-fluid levels in the paranasal sinuses. Mastoid air cells are clear. Other: None. IMPRESSION: No acute intracranial abnormalities. Electronically Signed   By: Burman Nieves M.D.   On: 10/20/2021 19:08     EKG: Independently reviewed, with result as described above.    Assessment/Plan   Principal Problem:   Syncope Active Problems:   Tobacco dependence   GAD (generalized anxiety disorder)     #) Syncope: 6-7 episodes of loss of consciousness without associated prodrome, occurring over the last 1 to 2 days, with potential postictal state lasting 1 to 2 minutes after regaining consciousness, but in the absence of any tongue biting, loss of bowel bladder function nor any evidence of tonic-clonic activity.  Differential includes the possibility of seizures, and neurology has been formally consulted, with recommendation for overnight observation, including continuous EEG monitoring which has been initiated.  In terms of potential factors contributing to seizures, if this is in fact the source of his syncope, include outpatient Wellbutrin, which is known to lower seizure threshold.  While the patient reports a history of alcohol and  recreational drug abuse, including that of stimulants as well as opioids, he reports that he has been completely clean and abstinent from these recreational drugs for several months.  Additionally, the long-acting nature of his outpatient Suboxone, decreases likelihood of opioid withdrawal induced seizures.   In terms of nonseizure differential, the absence of associated prodrome, decreases likelihood for vasovagal process, and no evidence of positional association to increase suspicion for orthostatic hypotension.  Differential includes  arrhythmia, including accessory pathways, which the patient has no known history.  We will closely monitor on telemetry overnight, and correlate concomitant telemetry findings should the patient experience any additional episodes of syncope over that timeframe.  No evidence of acute focal neurologic deficits, and CT head shows no evidence of acute intracranial process, including no evidence of intracranial hemorrhage.  No evidence of underlying infection, including no clinical evidence to suggest meningitis.  No significant/contributory electrolyte abnormalities.   Plan: Neurology consulted, with initiation of continuous EEG monitoring.  Prn IV Ativan.  N.p.o. for now.  Every 4 hours neurochecks.  Add on prolactin level.  Seizure precautions ordered.  Repeat CMP, CBC, serum magnesium level in the morning.  Close monitoring on telemetry, as above.       #) Generalized anxiety disorder: Documented history of such, on Wellbutrin as well as atenolol as an outpatient.  Plan: In the setting of potential seizures contributory to presenting syncope, hold home Wellbutrin for now.  Additionally, in the setting of borderline bradycardia and syncope, will hold home atenolol for now.  Monitor continuous telemetry.      #) Chronic tobacco abuse: Patient reports a long history of cigarette smoking, noting that he is more recently transition to vaping and requests nicotine patch  for use during his hospitalization.  Plan: Counseled patient on importance of reduction in nicotine use.  Nicotine patch ordered, per patient's request.      DVT prophylaxis: SCD's   Code Status: Full code Disposition Plan: Per Rounding Team Consults called: Dr. Derry Lory of neuro consulted, as further detailed above;  Admission status: Observation    PLEASE NOTE THAT DRAGON DICTATION SOFTWARE WAS USED IN THE CONSTRUCTION OF THIS NOTE.   Chaney Born Barett Whidbee DO [facility]  From 7PM - 7AM   10/21/2021, 6:43 AM

## 2021-10-21 NOTE — Hospital Course (Signed)
Patient is a 51 year old male with history of anxiety, depression, GERD, prior EtOH, clean for 3 years, history of prior polysubstance abuse, clean for 3 years, prior alcohol withdrawal seizures presented with several episodes of loss of consciousness.  Per patient these episodes have been going on for more than 2 years with no clear triggers.  He described it as brief whooshing sound in the top of his head followed by loss of consciousness for split-second and then is confused for 10 minutes.  Sometimes he has twitching of his eyelids and slurred speech.  Happens once every 3 to 4 months when initially started.  Over the last 1 to 2 days, patient had experienced at least 6-7 episodes without preceding any cardiac symptoms like dizziness palpitations diaphoresis, chest pain. ?No focal neurological deficits. ?Neurology consulted ?

## 2021-10-21 NOTE — Assessment & Plan Note (Signed)
Continue Suboxone 

## 2021-10-21 NOTE — Assessment & Plan Note (Addendum)
-   Continue to hold Wellbutrin, atenolol. ?-Appears to be at baseline, no acute issues. ?

## 2021-10-21 NOTE — Procedures (Addendum)
Patient Name: Scott [name]  ?MRN: [id number]  ?Epilepsy Attending: [name]  ?Referring Physician/Provider: Erick Blinks, MD  ?Duration: 10/21/2021 0043 to 10/21/2021 1652 ? ?Patient history: 51 y.o. male with PMH significant for anxiety, GERD, prior EtOh use(clean for 3 years), prior history of polysubstance use(clean for 3 years), prior alcohol withdrawal seizures, who presents today for several episodes of loss of consciousness. He is having 10+ episodes a day and they all seem identical to each other. He has a weird feeling in his head --> loses consciousness for a brief second --> confused + slurred speech for 10 mins and then back to his baseline.  EEG to evaluate for seizure. ? ?Level of alertness: Awake, asleep ? ?AEDs during EEG study: None ? ?Technical aspects: This EEG study was done with scalp electrodes positioned according to the 10-20 International system of electrode placement. Electrical activity was acquired at a sampling rate of 500Hz  and reviewed with a high frequency filter of 70Hz  and a low frequency filter of 1Hz . EEG data were recorded continuously and digitally stored.  ? ?Description: The posterior dominant rhythm consists of 9 Hz activity of moderate voltage (25-35 uV) seen predominantly in posterior head regions, symmetric and reactive to eye opening and eye closing. Sleep was characterized by vertex waves, sleep spindles (12 to 14 Hz), maximal frontocentral region.  There is an excessive amount of 15 to 18 Hz beta activity distributed symmetrically and diffusely. Hyperventilation and photic stimulation were not performed.    ? ?ABNORMALITY ?- Excessive beta, generalized ? ?IMPRESSION: ?This study is within normal limits. The excessive beta activity seen in the background is most likely due to the effect of benzodiazepine and is a benign EEG pattern. No seizures or epileptiform discharges were seen throughout the recording. ? ?  ? ?

## 2021-10-21 NOTE — Progress Notes (Signed)
HOSPITAL MEDICINE OVERNIGHT EVENT NOTE   ? ?Patient complaining of chronic left shoulder pain, typically uses topical analgesics in the home setting. ? ?Lidoderm patch ordered. ? ?Scott Elk  MD ?[facility]  ? ? ? ? ? ? ? ? ? ? ?

## 2021-10-21 NOTE — Progress Notes (Signed)
Discontinued cEEG study.  Notified [facility] monitoring.  No skin breakdown observed. 

## 2021-10-21 NOTE — Assessment & Plan Note (Signed)
-   Prior history of alcohol withdrawal seizures, presented with several episodes of loss of consciousness. ?-Neurology consulted, overnight EEG showed no seizure or epileptiform discharges  ?-Neurology following, will follow recommendations ?-Ativan as needed.  Hold off on Wellbutrin. ?

## 2021-10-21 NOTE — Progress Notes (Signed)
LTM maint complete - no skin breakdown [facility] monitored, Event button test confirmed by [facility]. ? ?

## 2021-10-21 NOTE — Assessment & Plan Note (Signed)
-   Currently stable, Wellbutrin, atenolol on hold ?

## 2021-10-21 NOTE — Progress Notes (Signed)
? ? ? [facility] ?                                                                            ? ? ?Noach Pribble, is a 51 y.o. male, DOB - 22-Jan-1971, [id number] ?Admit date - 10/20/2021    ?Outpatient Primary MD for the patient is Alan Ripper, [place] ? ?LOS - 0  days ? ? ? ?Brief summary  ? ?Patient is a 51 year old male with history of anxiety, depression, GERD, prior EtOH, clean for 3 years, history of prior polysubstance abuse, clean for 3 years, prior alcohol withdrawal seizures presented with several episodes of loss of consciousness.  Per patient these episodes have been going on for more than 2 years with Scott clear triggers.  He described it as brief whooshing sound in the top of his head followed by loss of consciousness for split-second and then is confused for 10 minutes.  Sometimes he has twitching of his eyelids and slurred speech.  Happens once every 3 to 4 months when initially started.  Over the last 1 to 2 days, patient had experienced at least 6-7 episodes without preceding any cardiac symptoms like dizziness palpitations diaphoresis, chest pain. ?Scott focal neurological deficits. ?Neurology consulted ? ? ?Assessment & Plan  ? ? ?Assessment and Plan: ?* Syncope ?-Patient had reported multiple episodes of loss of consciousness,(described as whooshing sound in his head followed by loss of consciousness for split-second and then confusion for about 10 minutes.  Sometimes has twitching of his eyelids and slurred speech)  Has prior history of head injury and longstanding alcohol history, now quit drinking. ?-CT head negative, troponins negative CK1 57, EKG with rate 51, sinus bradycardia, nonspecific EKG changes.  Hold atenolol. ?- will follow neurology commendations as not clear syncopal episodes ? ?Seizure disorder ([place]) ?- Prior history of alcohol withdrawal seizures, presented with several episodes of loss of consciousness. ?-Neurology consulted, overnight EEG showed Scott seizure or  epileptiform discharges  ?-Neurology following, will follow recommendations ?-Ativan as needed.  Hold off on Wellbutrin. ? ?Opioid type dependence, continuous ([place]) ?- Continue Suboxone ? ?GAD (generalized anxiety disorder) ?- Continue to hold Wellbutrin, atenolol. ?-Appears to be at baseline, Scott acute issues. ? ?Tobacco dependence ?-Continue nicotine patch ?  ?  ? ?MDD (major depressive disorder) ?- Currently stable, Wellbutrin, atenolol on hold ? ?Obesity ?Estimated body mass index is 46.72 kg/m? as calculated from the following: ?  Height as of 01/06/21: 5\' 11"  (1.803 m). ?  Weight as of 01/06/21: 152 kg. ? ?Code Status: Full CODE STATUS ?DVT Prophylaxis:  SCDs Start: 10/20/21 2242 ? ? ?Level of Care: Level of care: Telemetry Medical ?Family Communication: Updated patient's wife at the bedside ? ?Disposition Plan:     Remains inpatient appropriate: Work-up in progress ? ?Procedures:  ?EEG ? ?Consultants:   ?Neurology ? ?Antimicrobials:  ?None ? ?Medications ? ? buprenorphine-naloxone  1 tablet Sublingual Daily  ? lidocaine  1 patch Transdermal Daily  ? LORazepam  1 mg Intramuscular Once  ? nicotine  14 mg Transdermal Daily  ? ? ? ?Subjective:  ? ?Pinches Zarrella was seen and examined today.  Undergoing continuous EEG at the time of my examination.  Wife  at the bedside.  Scott repeat syncope or seizure episode overnight.  Patient denies dizziness, chest pain, shortness of breath, abdominal pain, N/V.  Scott new FND's. ? ?Objective:  ? ?Vitals:  ? 10/20/21 2145 10/21/21 0620 10/21/21 0730 10/21/21 0850  ?BP: (!) 151/95 131/73 127/75 121/78  ?Pulse: (!) 50 (!) 52 62 (!) 55  ?Resp: 13 14 16 14   ?Temp:  (!) 97.5 ?F (36.4 ?C) 97.8 ?F (36.6 ?C) (!) 97.4 ?F (36.3 ?C)  ?TempSrc:  Oral Oral Oral  ?SpO2: 98% 94% 92% 92%  ? ? ?Intake/Output Summary (Last 24 hours) at 10/21/2021 1125 ?Last data filed at 10/21/2021 0851 ?Gross per 24 hour  ?Intake 1000 ml  ?Output --  ?Net 1000 ml  ? ?Filed Weights  ? ? ? ?Exam ?General: Alert and oriented  x 3, NAD ?Cardiovascular: S1 S2 auscultated, RRR ?Respiratory: Clear to auscultation bilaterally, Scott wheezing ?Gastrointestinal: Soft, nontender, nondistended, + bowel sounds ?Ext: Scott pedal edema bilaterally ?Neuro: Scott new FND's ?Psych: Normal affect and demeanor, alert and oriented x3  ? ? ?Data Reviewed:  I have personally reviewed following labs  ? ? ?CBC ?Lab Results  ?Component Value Date  ? WBC 9.8 10/20/2021  ? RBC 5.03 10/20/2021  ? HGB 16.0 10/20/2021  ? HCT 44.4 10/20/2021  ? MCV 88.3 10/20/2021  ? MCH 31.8 10/20/2021  ? PLT 231 10/20/2021  ? MCHC 36.0 10/20/2021  ? RDW 13.0 10/20/2021  ? LYMPHSABS 3.5 10/20/2021  ? MONOABS 0.7 10/20/2021  ? EOSABS 0.1 10/20/2021  ? BASOSABS 0.1 10/20/2021  ? ? ? ?Last metabolic panel ?Lab Results  ?Component Value Date  ? NA 138 10/20/2021  ? K 4.6 10/20/2021  ? CL 103 10/20/2021  ? CO2 28 10/20/2021  ? BUN 16 10/20/2021  ? CREATININE 1.13 10/20/2021  ? GLUCOSE 90 10/20/2021  ? GFRNONAA >60 10/20/2021  ? GFRAA >60 03/19/2019  ? CALCIUM 9.1 10/20/2021  ? PHOS 4.0 02/13/2010  ? PROT 6.9 10/20/2021  ? ALBUMIN 3.6 10/20/2021  ? BILITOT 1.1 10/20/2021  ? ALKPHOS 59 10/20/2021  ? AST 29 10/20/2021  ? ALT 22 10/20/2021  ? ANIONGAP 7 10/20/2021  ? ? ? ?Radiology Studies: I have personally reviewed the imaging studies  ?CT Head Wo Contrast ? ?Result Date: 10/20/2021 ?IMPRESSION: Scott acute intracranial abnormalities. Electronically Signed   By: Lucienne Capers M.D.   On: 10/20/2021 19:08  ? ?Overnight EEG with video ? ?Result Date: 10/21/2021 ?IMPRESSION: This study is within normal limits. The excessive beta activity seen in the background is most likely due to the effect of benzodiazepine and is a benign EEG pattern. Scott seizures or epileptiform discharges were seen throughout the recording. Priyanka Barbra Sarks   ? ? ? ? ?Estill Cotta M.D. ?[facility] ?10/21/2021, 11:25 AM ? ?Available via Epic secure chat 7am-7pm ?After 7 pm, please refer to night coverage provider listed on  amion. ? ?  ?

## 2021-10-21 NOTE — Progress Notes (Signed)
?  Transition of Care (TOC) Screening Note ? ? ?Patient Details  ?Name: Scott [name] ?Date of Birth: 1971/04/14 ? ? ?Transition of Care (TOC) CM/SW Contact:    ?Harriet Masson, RN ?Phone Number: ?10/21/2021, 8:36 AM ? ? ? ?Transition of Care Department [facility]) has reviewed patient and no TOC needs have been identified at this time. We will continue to monitor patient advancement through interdisciplinary progression rounds. If new patient transition needs arise, please place a TOC consult. ? ? ?

## 2021-10-21 NOTE — Assessment & Plan Note (Addendum)
Continue nicotine patch  °

## 2021-10-21 NOTE — Progress Notes (Signed)
LTM EEG hooked up and running - no initial skin breakdown. 

## 2021-10-21 NOTE — Assessment & Plan Note (Addendum)
-  Patient had reported multiple episodes of loss of consciousness,(described as whooshing sound in his head followed by loss of consciousness for split-second and then confusion for about 10 minutes.  Sometimes has twitching of his eyelids and slurred speech)  Has prior history of head injury and longstanding alcohol history, now quit drinking. ?-CT head negative, troponins negative CK1 57, EKG with rate 51, sinus bradycardia, nonspecific EKG changes.  Hold atenolol. ?- will follow neurology commendations as not clear syncopal episodes ?

## 2021-10-21 NOTE — Plan of Care (Signed)

## 2021-10-26 NOTE — Discharge Summary (Signed)
?AMA NOTE  ? ? ?Patient ID: ?Scott [name] ?MRN: [id number] ?DOB/AGE: May 15, 1971 51 y.o. ? ?Admit date: 10/20/2021 ?Discharge date: 10/26/2021  ? ?PLEASE NOTE THAT PATIENT LEFT AGAINST MEDICAL ADVICE. Risks of syncope, seizures were explained in detail to the patient and he/she verbally understood the risks of leaving AMA.  ? ?Primary Care Physician:  Chiquita Loth, PA ? ?Discharge Diagnoses:   ? ? Syncope vs seizures ? Tobacco dependence ? GAD (generalized anxiety disorder) ? MDD (major depressive disorder) ? Opioid type dependence, continuous (HCC) ? ? ?Consults:  neurology ? ? ?Recommendations for Outpatient Follow-up: patient left AMA ? ? ?TESTS THAT NEED FOLLOW-UP ? ? ? ? ? ? ?Allergies: ?  ?Allergies  ?Allergen Reactions  ? Benadryl [Diphenhydramine Hcl] Anaphylaxis and Other (See Comments)  ?  Muscles lock up.  ? Celecoxib Diarrhea, Rash and Other (See Comments)  ?  Nose bleeds, bloating, migraines ?  ? Diphenhydramine Anaphylaxis  ?  Hard time breathing, convulsions,  ? Prednisone Other (See Comments)  ?  Severe Anger  ? Risperidone And Related Other (See Comments)  ?  Overly sedates  ? Nsaids Diarrhea, Rash and Other (See Comments)  ?  Nose bleeds, bloating, migraines  ? Other Other (See Comments)  ?  STEROIDS--caused anger,irritation  ? Rofecoxib Diarrhea, Rash and Other (See Comments)  ?  Nose bleeds, bloating, migraines  ? Tolmetin Diarrhea, Rash and Other (See Comments)  ?  Nose bleeds, bloating, migraines ?  ? Trazodone And Nefazodone Other (See Comments)  ?  headaches  ? Ibuprofen Rash  ? Latex Itching, Rash and Other (See Comments)  ?  Skin cracks  ? Paliperidone Palmitate Er Other (See Comments)  ?  Overly sedates  ? ? ? ?Discharge Medications: ?Please note that patient left AMA (against medical advice) ? ? ?Brief H and P: ?For complete details please refer to admission H and P, but in brief Patient is a 51 year old male with history of anxiety, depression, GERD, prior EtOH, clean for 3 years,  history of prior polysubstance abuse, clean for 3 years, prior alcohol withdrawal seizures presented with several episodes of loss of consciousness.  Per patient these episodes have been going on for more than 2 years with no clear triggers.  He described it as brief whooshing sound in the top of his head followed by loss of consciousness for split-second and then is confused for 10 minutes.  Sometimes he has twitching of his eyelids and slurred speech.  Happens once every 3 to 4 months when initially started.  Over the last 1 to 2 days, patient had experienced at least 6-7 episodes without preceding any cardiac symptoms like dizziness palpitations diaphoresis, chest pain. ?No focal neurological deficits. ?Neurology consulted ?  ? ?Hospital Course:  ?Syncope ?-Patient had reported multiple episodes of loss of consciousness,(described as whooshing sound in his head followed by loss of consciousness for split-second and then confusion for about 10 minutes.  Sometimes has twitching of his eyelids and slurred speech)  Has prior history of head injury and longstanding alcohol history, now quit drinking. ?-CT head negative, troponins negative CK1 57, EKG with rate 51, sinus bradycardia, nonspecific EKG changes.  Hold atenolol. ?- will follow neurology commendations as not clear syncopal episodes ?  ? ? ?Seizure disorder (HCC) vs pseudoseizures  ?- Prior history of alcohol withdrawal seizures, presented with several episodes of loss of consciousness. ?-Neurology consulted, overnight EEG showed no seizure or epileptiform discharges  ?-Ativan as needed.  Hold off on  Wellbutrin. ?- patient was still receiving continuous EEG when he decided to leave AMA (stated that he did not have childcare for the evening) ?  ?Opioid type dependence, continuous (HCC) ?- Continue Suboxone ?  ?GAD (generalized anxiety disorder) ?- Continue to hold Wellbutrin, atenolol. ?-Appears to be at baseline, no acute issues. ?  ?Tobacco  dependence ?-Continue nicotine patch ?  ?  ?  ?MDD (major depressive disorder) ?- Currently stable, Wellbutrin, atenolol on hold ?  ?Obesity ?Estimated body mass index is 46.72 kg/m? as calculated from the following: ?  Height as of 01/06/21: 5\' 11"  (1.803 m). ?  Weight as of 01/06/21: 152 kg. ? ? ?Day of Discharge ?BP 140/73 (BP Location: Right Arm)   Pulse 67   Temp 97.7 ?F (36.5 ?C) (Oral)   Resp 19   SpO2 94%  ? ? ? ? ?The results of significant diagnostics from this hospitalization (including imaging, microbiology, ancillary and laboratory) are listed below for reference.   ? ?LAB RESULTS: ?Basic Metabolic Panel: ?Recent Labs  ?Lab 10/20/21 ?1336  ?NA 138  ?K 4.6  ?CL 103  ?CO2 28  ?GLUCOSE 90  ?BUN 16  ?CREATININE 1.13  ?CALCIUM 9.1  ?MG 1.9  ? ?Liver Function Tests: ?Recent Labs  ?Lab 10/20/21 ?1336  ?AST 29  ?ALT 22  ?ALKPHOS 59  ?BILITOT 1.1  ?PROT 6.9  ?ALBUMIN 3.6  ? ?No results for input(s): LIPASE, AMYLASE in the last 168 hours. ?No results for input(s): AMMONIA in the last 168 hours. ?CBC: ?Recent Labs  ?Lab 10/20/21 ?1336  ?WBC 9.8  ?NEUTROABS 5.4  ?HGB 16.0  ?HCT 44.4  ?MCV 88.3  ?PLT 231  ? ?Cardiac Enzymes: ?Recent Labs  ?Lab 10/20/21 ?1336  ?CKTOTAL 157  ? ?BNP: ?Invalid input(s): POCBNP ?CBG: ?No results for input(s): GLUCAP in the last 168 hours. ? ?Significant Diagnostic Studies:  ?CT Head Wo Contrast ? ?Result Date: 10/20/2021 ?CLINICAL DATA:  New onset seizures.  No trauma. EXAM: CT HEAD WITHOUT CONTRAST TECHNIQUE: Contiguous axial images were obtained from the base of the skull through the vertex without intravenous contrast. RADIATION DOSE REDUCTION: This exam was performed according to the departmental dose-optimization program which includes automated exposure control, adjustment of the mA and/or kV according to patient size and/or use of iterative reconstruction technique. COMPARISON:  01/26/2014 FINDINGS: Brain: No evidence of acute infarction, hemorrhage, hydrocephalus, extra-axial  collection or mass lesion/mass effect. Vascular: No hyperdense vessel or unexpected calcification. Skull: Calvarium appears intact. Sinuses/Orbits: Retention cysts in the maxillary antra. No acute air-fluid levels in the paranasal sinuses. Mastoid air cells are clear. Other: None. IMPRESSION: No acute intracranial abnormalities. Electronically Signed   By: 01/28/2014 M.D.   On: 10/20/2021 19:08  ? ?Overnight EEG with video ? ?Result Date: 10/21/2021 ?10/23/2021, MD     10/21/2021  6:32 PM Patient Name: Scott [name] MRN: Scott [name] Epilepsy Attending: [id number] Referring Physician/Provider: [name], MD Duration: 10/21/2021 0043 to 10/21/2021 1652 Patient history: 51 y.o. male with PMH significant for anxiety, GERD, prior EtOh use(clean for 3 years), prior history of polysubstance use(clean for 3 years), prior alcohol withdrawal seizures, who presents today for several episodes of loss of consciousness. He is having 10+ episodes a day and they all seem identical to each other. He has a weird feeling in his head --> loses consciousness for a brief second --> confused + slurred speech for 10 mins and then back to his baseline.  EEG to evaluate for seizure. Level  of alertness: Awake, asleep AEDs during EEG study: None Technical aspects: This EEG study was done with scalp electrodes positioned according to the 10-20 International system of electrode placement. Electrical activity was acquired at a sampling rate of  and reviewed with a high frequency filter of  and a low frequency filter of . EEG data were recorded continuously and digitally stored. Description: The posterior dominant rhythm consists of 9 Hz activity of moderate voltage (25-35 uV) seen predominantly in posterior head regions, symmetric and reactive to eye opening and eye closing. Sleep was characterized by vertex waves, sleep spindles (12 to 14 Hz), maximal frontocentral region.  There is an excessive amount of 15 to  18 Hz beta activity distributed symmetrically and diffusely. Hyperventilation and photic stimulation were not performed.   ABNORMALITY - Excessive beta, generalized IMPRESSION: This study is within normal limits. The excessi

## 2022-03-25 DIAGNOSIS — Z8619 Personal history of other infectious and parasitic diseases: Secondary | ICD-10-CM | POA: Diagnosis not present

## 2022-03-25 DIAGNOSIS — Z1211 Encounter for screening for malignant neoplasm of colon: Secondary | ICD-10-CM | POA: Diagnosis not present

## 2022-03-25 DIAGNOSIS — K7460 Unspecified cirrhosis of liver: Secondary | ICD-10-CM | POA: Diagnosis not present

## 2022-03-25 DIAGNOSIS — B182 Chronic viral hepatitis C: Secondary | ICD-10-CM | POA: Diagnosis not present

## 2022-04-02 DIAGNOSIS — B181 Chronic viral hepatitis B without delta-agent: Secondary | ICD-10-CM | POA: Diagnosis not present

## 2022-04-06 DIAGNOSIS — Z1211 Encounter for screening for malignant neoplasm of colon: Secondary | ICD-10-CM | POA: Diagnosis not present

## 2022-04-06 DIAGNOSIS — K635 Polyp of colon: Secondary | ICD-10-CM | POA: Diagnosis not present

## 2022-04-08 DIAGNOSIS — R5383 Other fatigue: Secondary | ICD-10-CM | POA: Diagnosis not present

## 2022-04-08 DIAGNOSIS — F1120 Opioid dependence, uncomplicated: Secondary | ICD-10-CM | POA: Diagnosis not present

## 2022-04-08 DIAGNOSIS — E559 Vitamin D deficiency, unspecified: Secondary | ICD-10-CM | POA: Diagnosis not present

## 2022-04-08 DIAGNOSIS — M5412 Radiculopathy, cervical region: Secondary | ICD-10-CM | POA: Diagnosis not present

## 2022-04-08 DIAGNOSIS — Z79899 Other long term (current) drug therapy: Secondary | ICD-10-CM | POA: Diagnosis not present

## 2022-04-08 DIAGNOSIS — D539 Nutritional anemia, unspecified: Secondary | ICD-10-CM | POA: Diagnosis not present

## 2022-04-08 DIAGNOSIS — Z79891 Long term (current) use of opiate analgesic: Secondary | ICD-10-CM | POA: Diagnosis not present

## 2022-04-15 DIAGNOSIS — H5203 Hypermetropia, bilateral: Secondary | ICD-10-CM | POA: Diagnosis not present

## 2022-04-18 DIAGNOSIS — K635 Polyp of colon: Secondary | ICD-10-CM | POA: Diagnosis not present

## 2022-05-05 DIAGNOSIS — Z79899 Other long term (current) drug therapy: Secondary | ICD-10-CM | POA: Diagnosis not present

## 2022-05-21 ENCOUNTER — Emergency Department (HOSPITAL_COMMUNITY)
Admission: EM | Admit: 2022-05-21 | Discharge: 2022-05-22 | Discharge status: Against Medical Advice | Payer: Medicare Other | Attending: Student | Admitting: Student

## 2022-05-21 ENCOUNTER — Emergency Department (HOSPITAL_COMMUNITY): Payer: Medicare Other

## 2022-05-21 ENCOUNTER — Encounter (HOSPITAL_COMMUNITY): Payer: Self-pay

## 2022-05-21 ENCOUNTER — Other Ambulatory Visit: Payer: Self-pay

## 2022-05-21 DIAGNOSIS — R531 Weakness: Secondary | ICD-10-CM | POA: Diagnosis not present

## 2022-05-21 DIAGNOSIS — M79602 Pain in left arm: Secondary | ICD-10-CM | POA: Insufficient documentation

## 2022-05-21 DIAGNOSIS — R202 Paresthesia of skin: Secondary | ICD-10-CM | POA: Insufficient documentation

## 2022-05-21 DIAGNOSIS — Z5321 Procedure and treatment not carried out due to patient leaving prior to being seen by health care provider: Secondary | ICD-10-CM | POA: Diagnosis not present

## 2022-05-21 LAB — CBC WITH DIFFERENTIAL/PLATELET
Abs Immature Granulocytes: 0.03 10*3/uL (ref 0.00–0.07)
Basophils Absolute: 0.1 10*3/uL (ref 0.0–0.1)
Basophils Relative: 1 %
Eosinophils Absolute: 0.3 10*3/uL (ref 0.0–0.5)
Eosinophils Relative: 3 %
HCT: 40.7 % (ref 39.0–52.0)
Hemoglobin: 14.5 g/dL (ref 13.0–17.0)
Immature Granulocytes: 0 %
Lymphocytes Relative: 34 %
Lymphs Abs: 2.9 10*3/uL (ref 0.7–4.0)
MCH: 30.9 pg (ref 26.0–34.0)
MCHC: 35.6 g/dL (ref 30.0–36.0)
MCV: 86.6 fL (ref 80.0–100.0)
Monocytes Absolute: 0.7 10*3/uL (ref 0.1–1.0)
Monocytes Relative: 8 %
Neutro Abs: 4.7 10*3/uL (ref 1.7–7.7)
Neutrophils Relative %: 54 %
Platelets: 209 10*3/uL (ref 150–400)
RBC: 4.7 MIL/uL (ref 4.22–5.81)
RDW: 12.9 % (ref 11.5–15.5)
WBC: 8.6 10*3/uL (ref 4.0–10.5)
nRBC: 0 % (ref 0.0–0.2)

## 2022-05-21 LAB — BASIC METABOLIC PANEL
Anion gap: 10 (ref 5–15)
BUN: 15 mg/dL (ref 6–20)
CO2: 24 mmol/L (ref 22–32)
Calcium: 8.9 mg/dL (ref 8.9–10.3)
Chloride: 103 mmol/L (ref 98–111)
Creatinine, Ser: 1 mg/dL (ref 0.61–1.24)
GFR, Estimated: >60 mL/min (ref >60)
Glucose, Bld: 90 mg/dL (ref 70–99)
Potassium: 3.7 mmol/L (ref 3.5–5.1)
Sodium: 137 mmol/L (ref 135–145)

## 2022-05-21 LAB — PROTIME-INR
INR: 1.2 (ref 0.8–1.2)
Prothrombin Time: 14.6 seconds (ref 11.4–15.2)

## 2022-05-21 NOTE — ED Provider Triage Note (Signed)
Emergency Medicine Provider Triage Evaluation Note  Scott [name] , a 51 y.o. male  was evaluated in triage.  Pt complains of paresthesias and weakness on the left side, states she has been having chronic left lower leg paresthesias is from all of his back surgeries, the nose saddle paresthesias no urinary bowel incontinence he has no recent trauma.  He also states having left arm paresthesias states is going on for last 6 months but is got worse today, states she feels like a shocklike sensation moving down his left arm entire move his neck, states he has a slight headache and will occasionally have change in vision no recent trauma has no other complaints.  Review of Systems  Positive: Left arm and leg paresthesias Negative: Pain, shortness of breath  Physical Exam  BP (!) 141/90 (BP Location: Left Arm)   Pulse (!) 59   Temp 99 F (37.2 C) (Oral)   Resp 16   Ht 6' (1.829 m)   Wt (!) 140.2 kg   SpO2 92%   BMI 41.91 kg/m  Gen:   Awake, no distress   Resp:  Normal effort  MSK:   Moves extremities without difficulty  Other:  Cranial nerves II through XII grossly intact no difficulty with word finding fine two-step commands no unilateral weakness present.  Medical Decision Making  Medically screening exam initiated at 7:20 PM.  Appropriate orders placed.  Scott [name] was informed that the remainder of the evaluation will be completed by another provider, this initial triage assessment does not replace that evaluation, and the importance of remaining in the ED until their evaluation is complete.  Lab work imaging ordered will need further work-up.     Marcello Fennel, PA-C 05/21/22 1921

## 2022-05-21 NOTE — ED Triage Notes (Signed)
Pt reports left arm pain, described as "shocking." He reports numbness to his left hand but this has been ongoing for a month. The arm pain is worse when he moves his neck. His PCP sent him here to have a MRI.

## 2022-05-22 NOTE — ED Notes (Signed)
Pt stated that he had a family emergency and he had to leave. Pt seen leaving the ED.

## 2022-06-08 ENCOUNTER — Telehealth: Payer: Self-pay

## 2022-06-08 NOTE — Telephone Encounter (Signed)
        Patient  visited [facility] [place] on 05/22/2022  for arm pain.   Telephone encounter attempt : 1st   A HIPAA compliant voice message was left requesting a return call.  Instructed patient to call back at [phone].   [place] Resource Care Guide   [email or web address]  ?? [phone]   Website: [email or web address]  [email or web address]

## 2022-06-09 ENCOUNTER — Telehealth: Payer: Self-pay

## 2022-06-09 NOTE — Telephone Encounter (Signed)
        Patient  visited [facility] [place] on 05/22/2022  for arm pain.   Telephone encounter attempt :  2nd  A HIPAA compliant voice message was left requesting a return call.  Instructed patient to call back at [phone].   [place] Resource Care Guide   [email or web address]  ?? [phone]   Website: [email or web address]  [email or web address]

## 2022-06-10 ENCOUNTER — Telehealth: Payer: Self-pay

## 2022-06-10 NOTE — Telephone Encounter (Signed)
        Patient  visited [facility] [place] on 05/22/2022  for arm pain.   Telephone encounter attempt :  3rd  A HIPAA compliant voice message was left requesting a return call.  Instructed patient to call back at [phone].   [place] Resource Care Guide   [email or web address]  ?? [phone]   Website: [email or web address]

## 2022-08-16 ENCOUNTER — Emergency Department (HOSPITAL_BASED_OUTPATIENT_CLINIC_OR_DEPARTMENT_OTHER): Payer: Medicare HMO

## 2022-08-16 ENCOUNTER — Emergency Department (HOSPITAL_BASED_OUTPATIENT_CLINIC_OR_DEPARTMENT_OTHER): Payer: Medicare HMO | Admitting: Radiology

## 2022-08-16 ENCOUNTER — Emergency Department (HOSPITAL_BASED_OUTPATIENT_CLINIC_OR_DEPARTMENT_OTHER)
Admission: EM | Admit: 2022-08-16 | Discharge: 2022-08-17 | Disposition: A | Discharge status: Home / Self Care | Payer: Medicare HMO | Attending: Emergency Medicine | Admitting: Emergency Medicine

## 2022-08-16 ENCOUNTER — Other Ambulatory Visit: Payer: Self-pay

## 2022-08-16 ENCOUNTER — Encounter (HOSPITAL_BASED_OUTPATIENT_CLINIC_OR_DEPARTMENT_OTHER): Payer: Self-pay | Admitting: Emergency Medicine

## 2022-08-16 DIAGNOSIS — Z91040 Latex allergy status: Secondary | ICD-10-CM | POA: Diagnosis not present

## 2022-08-16 DIAGNOSIS — R079 Chest pain, unspecified: Secondary | ICD-10-CM | POA: Insufficient documentation

## 2022-08-16 DIAGNOSIS — R1011 Right upper quadrant pain: Secondary | ICD-10-CM | POA: Diagnosis present

## 2022-08-16 DIAGNOSIS — R5383 Other fatigue: Secondary | ICD-10-CM | POA: Diagnosis not present

## 2022-08-16 DIAGNOSIS — R600 Localized edema: Secondary | ICD-10-CM | POA: Diagnosis not present

## 2022-08-16 DIAGNOSIS — R0609 Other forms of dyspnea: Secondary | ICD-10-CM | POA: Diagnosis not present

## 2022-08-16 LAB — URINALYSIS, ROUTINE W REFLEX MICROSCOPIC
Bilirubin Urine: NEGATIVE
Glucose, UA: NEGATIVE mg/dL
Ketones, ur: NEGATIVE mg/dL
Leukocytes,Ua: NEGATIVE
Nitrite: NEGATIVE
Protein, ur: 30 mg/dL — AB
RBC / HPF: >50 RBC/hpf — ABNORMAL HIGH (ref 0–5)
Specific Gravity, Urine: 1.012 (ref 1.005–1.030)
pH: 6.5 (ref 5.0–8.0)

## 2022-08-16 LAB — COMPREHENSIVE METABOLIC PANEL
ALT: 13 U/L (ref 0–44)
AST: 16 U/L (ref 15–41)
Albumin: 4.1 g/dL (ref 3.5–5.0)
Alkaline Phosphatase: 53 U/L (ref 38–126)
Anion gap: 8 (ref 5–15)
BUN: 15 mg/dL (ref 6–20)
CO2: 29 mmol/L (ref 22–32)
Calcium: 9.6 mg/dL (ref 8.9–10.3)
Chloride: 102 mmol/L (ref 98–111)
Creatinine, Ser: 1.04 mg/dL (ref 0.61–1.24)
GFR, Estimated: >60 mL/min (ref >60)
Glucose, Bld: 97 mg/dL (ref 70–99)
Potassium: 4.1 mmol/L (ref 3.5–5.1)
Sodium: 139 mmol/L (ref 135–145)
Total Bilirubin: 0.3 mg/dL (ref 0.3–1.2)
Total Protein: 7.1 g/dL (ref 6.5–8.1)

## 2022-08-16 LAB — CBC
HCT: 40.6 % (ref 39.0–52.0)
Hemoglobin: 14.1 g/dL (ref 13.0–17.0)
MCH: 30.3 pg (ref 26.0–34.0)
MCHC: 34.7 g/dL (ref 30.0–36.0)
MCV: 87.1 fL (ref 80.0–100.0)
Platelets: 226 10*3/uL (ref 150–400)
RBC: 4.66 MIL/uL (ref 4.22–5.81)
RDW: 13.7 % (ref 11.5–15.5)
WBC: 9.6 10*3/uL (ref 4.0–10.5)
nRBC: 0 % (ref 0.0–0.2)

## 2022-08-16 LAB — LIPASE, BLOOD: Lipase: 37 U/L (ref 11–51)

## 2022-08-16 LAB — TROPONIN I (HIGH SENSITIVITY): Troponin I (High Sensitivity): 2 ng/L (ref <18)

## 2022-08-16 MED ORDER — METOCLOPRAMIDE HCL 5 MG/ML IJ SOLN: 10.0000 mg | Freq: Once | INTRAMUSCULAR | Status: DC

## 2022-08-16 MED ORDER — HYDROMORPHONE HCL 1 MG/ML IJ SOLN
1.0000 mg | Freq: Once | INTRAMUSCULAR | Status: AC
  Administered 2022-08-16: 1 mg via INTRAVENOUS
  Filled 2022-08-16: qty 1

## 2022-08-16 MED ORDER — IOHEXOL 300 MG/ML  SOLN
100.0000 mL | Freq: Once | INTRAMUSCULAR | Status: AC | PRN
  Administered 2022-08-16: 100 mL via INTRAVENOUS

## 2022-08-16 MED ORDER — ONDANSETRON HCL 4 MG/2ML IJ SOLN
4.0000 mg | Freq: Once | INTRAMUSCULAR | Status: AC
  Administered 2022-08-16: 4 mg via INTRAVENOUS
  Filled 2022-08-16: qty 2

## 2022-08-16 NOTE — ED Triage Notes (Signed)
Pt arrives to ED with c/o right upper abdominal pain that started x5 days. He notes weakness, DOE, mild SOB. He notes unintentional weight loss.

## 2022-08-16 NOTE — ED Provider Notes (Signed)
[facility] EMERGENCY DEPT Provider Note   CSN: [id number] Arrival date & time: 08/16/22  1843     History  Chief Complaint  Patient presents with   Abdominal Pain    Scott [name] is a 52 y.o. male with history significant for alcoholic cirrhosis, hepatitis C, polysubstance abuse presents to the ED complaining of right upper abdominal pain that started approximately 5 days ago.  He also reports that he has felt extremely fatigued, has dyspnea on exertion, and associated chest pain.  He also notes a 75 pound unintentional weight loss over the last 6 months.  He reports that he would occasionally have "twinges" of pain in the right upper quadrant but it became consistent over the last several days.  Denies fever, chills, dysuria, flank pain, vomiting, diarrhea.       Home Medications Prior to Admission medications   Medication Sig Start Date End Date Taking? Authorizing Provider  atenolol (TENORMIN) 50 MG tablet Take 50 mg by mouth daily. 10/11/21   [provider]  Buprenorphine HCl-Naloxone HCl 8-2 MG FILM Place 1 Film under the tongue 3 (three) times daily.    [provider]  buPROPion (WELLBUTRIN SR) 150 MG 12 hr tablet Take 150 mg by mouth 2 (two) times daily. Patient not taking: Reported on 10/20/2021    [provider]  buPROPion (WELLBUTRIN XL) 300 MG 24 hr tablet Take 300 mg by mouth 2 (two) times daily. 10/11/21   [provider]  butalbital-acetaminophen-caffeine (FIORICET) 50-325-40 MG tablet Take 1 tablet by mouth 2 (two) times daily as needed for headache. Patient not taking: Reported on 10/20/2021    [provider]  diclofenac Sodium (VOLTAREN) 1 % GEL Apply 4 g topically 4 (four) times daily. Patient not taking: Reported on 10/20/2021 01/07/21   Melene Plan, DO  furosemide (LASIX) 20 MG tablet Take 20-40 mg by mouth daily as needed for fluid. 09/21/21   [provider]  gabapentin (NEURONTIN) 400 MG capsule  Take 400 mg by mouth 3 (three) times daily. 10/11/21   [provider]  gabapentin (NEURONTIN) 600 MG tablet Take 1 tablet (600 mg total) by mouth 3 (three) times daily. Patient not taking: Reported on 10/20/2021 09/01/18   Malvin Johns, MD  lisinopril (ZESTRIL) 10 MG tablet Take 10 mg by mouth daily. Patient not taking: Reported on 10/20/2021 09/12/21   [provider]  phentermine (ADIPEX-P) 37.5 MG tablet Take 37.5 mg by mouth daily before breakfast.    [provider]  Vitamin D, Ergocalciferol, (DRISDOL) 1.25 MG (50000 UNIT) CAPS capsule Take 50,000 Units by mouth once a week. 06/10/21   [provider]  zolpidem (AMBIEN) 5 MG tablet Take 5 mg by mouth at bedtime as needed for sleep. 10/11/21   [provider]      Allergies    Benadryl [diphenhydramine hcl], Celecoxib, Diphenhydramine, Prednisone, Risperidone and related, Nsaids, Other, Rofecoxib, Tolmetin, Trazodone and nefazodone, Ibuprofen, Latex, and Paliperidone palmitate er    Review of Systems   Review of Systems  Constitutional:  Positive for unexpected weight change. Negative for chills and fever.  Respiratory:  Positive for shortness of breath. Negative for cough and chest tightness.   Cardiovascular:  Positive for chest pain.  Gastrointestinal:  Positive for abdominal pain and nausea. Negative for diarrhea and vomiting.  Genitourinary:  Negative for decreased urine volume, difficulty urinating and dysuria.  Musculoskeletal:  Negative for back pain.    Physical Exam Updated Vital Signs BP 101/66 (BP  Location: Left Arm)   Pulse (!) 55   Temp 97.8 F (36.6 C) (Oral)   Resp 17   Ht 5\' 11"  (1.803 m)   Wt 134.7 kg   SpO2 95%   BMI 41.42 kg/m  Physical Exam Vitals and nursing note reviewed.  Constitutional:      General: He is not in acute distress.    Appearance: He is not ill-appearing.  HENT:     Mouth/Throat:     Mouth: Mucous membranes are moist.     Pharynx: Oropharynx is  clear.  Cardiovascular:     Rate and Rhythm: Normal rate and regular rhythm.     Pulses: Normal pulses.     Heart sounds: Normal heart sounds.     Comments: Patient has chronic bilateral lower extremity edema that he takes diuretics for Pulmonary:     Effort: Pulmonary effort is normal. No respiratory distress.     Breath sounds: Normal breath sounds and air entry.  Abdominal:     General: Abdomen is flat. Bowel sounds are normal. There is no distension.     Palpations: Abdomen is soft.     Tenderness: There is abdominal tenderness in the right upper quadrant and epigastric area. Positive signs include Murphy's sign.     Comments: Patient was extremely tender to palpation of the right upper quadrant and epigastric region with a positive Murphy sign  Musculoskeletal:     Right lower leg: Edema present.     Left lower leg: Edema present.  Skin:    General: Skin is warm and dry.     Capillary Refill: Capillary refill takes less than 2 seconds.  Neurological:     Mental Status: He is alert. Mental status is at baseline.  Psychiatric:        Mood and Affect: Mood normal.        Behavior: Behavior normal.     ED Results / Procedures / Treatments   Labs (all labs ordered are listed, but only abnormal results are displayed) Labs Reviewed  URINALYSIS, ROUTINE W REFLEX MICROSCOPIC - Abnormal; Notable for the following components:      Result Value   Hgb urine dipstick LARGE (*)    Protein, ur 30 (*)    RBC / HPF >50 (*)    Bacteria, UA RARE (*)    All other components within normal limits  URINE CULTURE  LIPASE, BLOOD  COMPREHENSIVE METABOLIC PANEL  CBC  TROPONIN I (HIGH SENSITIVITY)  TROPONIN I (HIGH SENSITIVITY)    EKG EKG Interpretation  Date/Time:  Monday August 16 2022 18:55:04 EST Ventricular Rate:  67 PR Interval:  172 QRS Duration: 104 QT Interval:  410 QTC Calculation: 433 R Axis:   -1 Text Interpretation: Normal sinus rhythm Low voltage QRS Cannot rule out  Anterior infarct , age undetermined Abnormal ECG When compared with ECG of 20-Oct-2021 15:48, PREVIOUS ECG IS PRESENT No significant change since last tracing Confirmed by Fredia Sorrow [phone]) on 08/16/2022 7:16:20 PM  Radiology CT ABDOMEN PELVIS W CONTRAST  Result Date: 08/17/2022 CLINICAL DATA:  Abdominal pain, acute, nonlocalized. Right upper quadrant abdominal pain. EXAM: CT ABDOMEN AND PELVIS WITH CONTRAST TECHNIQUE: Multidetector CT imaging of the abdomen and pelvis was performed using the standard protocol following bolus administration of intravenous contrast. RADIATION DOSE REDUCTION: This exam was performed according to the departmental dose-optimization program which includes automated exposure control, adjustment of the mA and/or kV according to patient size and/or use of iterative reconstruction technique. CONTRAST:  OMNIPAQUE IOHEXOL 300 MG/ML  SOLN COMPARISON:  None Available. FINDINGS: Lower chest: No acute abnormality. Hepatobiliary: No focal liver abnormality is seen. No gallstones, gallbladder wall thickening, or biliary dilatation. Pancreas: Unremarkable Spleen: Unremarkable Adrenals/Urinary Tract: The adrenal glands are unremarkable. The kidneys are normal in size and position. 2 mm nonobstructing calculus within the upper pole the left kidney. No hydronephrosis. No ureteral calculi. The bladder is unremarkable. Stomach/Bowel: Stomach is within normal limits. Appendix absent. No evidence of bowel wall thickening, distention, or inflammatory changes. Vascular/Lymphatic: No significant vascular findings are present. No enlarged abdominal or pelvic lymph nodes. Reproductive: Prostate is unremarkable. Other: No abdominal wall hernia or abnormality. No abdominopelvic ascites. Musculoskeletal: No acute bone abnormality. No lytic or blastic bone lesion. IMPRESSION: 1. No acute intra-abdominal pathology identified. No definite radiographic explanation for the patient's reported symptoms. 2.  Minimal left nonobstructing nephrolithiasis. No urolithiasis. No hydronephrosis. Electronically Signed   By: Helyn Numbers M.D.   On: 08/17/2022 00:19   US Abdomen Limited RUQ (LIVER/GB)  Result Date: 08/16/2022 CLINICAL DATA:  Right upper quadrant abdominal pain EXAM: ULTRASOUND ABDOMEN LIMITED RIGHT UPPER QUADRANT COMPARISON:  None Available. FINDINGS: Gallbladder: The gallbladder is not distended, no intraluminal stones or sludge is identified, and there is no gallbladder wall thickening or pericholecystic fluid seen. The sonographic Eulah Pont sign is reportedly positive. Common bile duct: Diameter: 4 mm in proximal diameter Liver: Hepatic parenchymal echogenicity is diffusely increased and there is poor acoustic through transmission in keeping with moderate hepatic steatosis. No focal intrahepatic masses are seen and there is no intrahepatic biliary ductal dilation. Portal vein is patent on color Doppler imaging with normal direction of blood flow towards the liver. Other: None. IMPRESSION: 1. Moderate hepatic steatosis. 2. Positive sonographic Murphy sign, equivocal given the otherwise unremarkable appearance of the gallbladder. Electronically Signed   By: Helyn Numbers M.D.   On: 08/16/2022 21:31   DG Chest 2 View  Result Date: 08/16/2022 CLINICAL DATA:  Chest pain and dyspnea EXAM: CHEST - 2 VIEW COMPARISON:  Chest x-ray 07/12/2021 FINDINGS: The heart size and mediastinal contours are within normal limits. Both lungs are clear. The visualized skeletal structures are unremarkable. IMPRESSION: No active cardiopulmonary disease. Electronically Signed   By: Darliss Cheney M.D.   On: 08/16/2022 21:24    Procedures Procedures    Medications Ordered in ED Medications  ondansetron [facility]) injection 4 mg (4 mg Intravenous Given 08/16/22 2059)  HYDROmorphone (DILAUDID) injection 1 mg (1 mg Intravenous Given 08/16/22 2059)  iohexol (OMNIPAQUE) 300 MG/ML solution 100 mL (100 mLs Intravenous Contrast Given  08/16/22 2358)    ED Course/ Medical Decision Making/ A&P                           Medical Decision Making Amount and/or Complexity of Data Reviewed Labs: ordered. Radiology: ordered.  Risk Prescription drug management.   This patient presents to the ED with chief complaint(s) of right upper quadrant pain, nausea, dyspnea on exertion with associated chest pain, and unintentional weight loss with pertinent past medical history of alcoholic cirrhosis, hepatitis C, polysubstance abuse.The complaint involves an extensive differential diagnosis and also carries with it a high risk of complications and morbidity.    The differential diagnosis includes cholelithiasis, cholecystitis, hepatitis, pancreatitis, gastroenteritis, gastritis, neoplasm  The initial plan is to obtain baseline labs and ultrasound of right upper quadrant  Additional history obtained: Additional history obtained from  none Records reviewed  none, unable  to see any prior notes related to history of liver problems  Initial Assessment:   Exam significant for tender right upper quadrant epigastric region with a positive Murphy sign.  Lungs are clear to auscultation bilaterally.  Heart rate is normal with regular rhythm.  Skin is warm and dry and nondiaphoretic.  Bowel sounds are normal.  Abdomen is nondistended.  He does have chronic lower extremity edema that is unchanged.  Independent ECG/labs interpretation:  The following labs were independently interpreted:  Urinalysis significant for large amount of hemoglobin, proteinuria, and rare bacteria.  Will culture.  Patient is not currently having any urinary symptoms. Metabolic panel demonstrates no evidence of electrolyte disturbances.  LFTs are within normal range.  Kidney function is also within normal range. Troponin is negative. Lipase is negative, no evidence of pancreatitis. ECG demonstrates sinus rhythm without evidence of ischemia or infarction.  Independent  visualization and interpretation of imaging: I independently visualized the following imaging with scope of interpretation limited to determining acute life threatening conditions related to emergency care: Ultrasound of right upper quadrant, which revealed positive Murphy sign on exam, otherwise there is no evidence of gallstones or gallbladder thickening.  No evidence of ductal dilatation.  Treatment and Reassessment: Will treat patient's pain and nausea while in ED.  Based on nonspecific findings of ultrasound aside from a positive Murphy sign, will order a CT abdomen pelvis for further evaluation.  CT revealed no acute intra-abdominal pathology to explain his symptoms.  Will recommend patient follow-up with primary care provider or gastroenterology for his symptoms.  Patient symptoms may be related to reflux disease or stomach ulcer that would require an endoscopic examination.  Disposition:   The patient has been appropriately medically screened and/or stabilized in the ED. I have low suspicion for any other emergent medical condition which would require further screening, evaluation or treatment in the ED or require inpatient management. At time of discharge the patient is hemodynamically stable and in no acute distress. I have discussed work-up results and diagnosis with patient and answered all questions. Patient is agreeable with discharge plan. We discussed strict return precautions for returning to the emergency department and they verbalized understanding.  Advised patient to follow-up closely with gastroenterology regarding his abdominal pain.           Final Clinical Impression(s) / ED Diagnoses Final diagnoses:  Right upper quadrant abdominal pain    Rx / DC Orders ED Discharge Orders     None         Lenard Simmer, PA 08/17/22 0031    Vanetta Mulders, MD 08/20/22 2037

## 2022-08-16 NOTE — ED Notes (Signed)
Patient returned from xray and US.

## 2022-08-17 ENCOUNTER — Emergency Department (HOSPITAL_BASED_OUTPATIENT_CLINIC_OR_DEPARTMENT_OTHER): Payer: Medicare HMO

## 2022-08-17 LAB — TROPONIN I (HIGH SENSITIVITY): Troponin I (High Sensitivity): <2 ng/L (ref <18)

## 2022-08-17 NOTE — Discharge Instructions (Signed)
Thank you for allowing me to be part of your care today.  Your workup here was very reassuring for no emergent conditions that would require treatment or hospitalization at this time.  I do recommend that you call your gastroenterologist and schedule an appointment within to further investigate your symptoms.  Return to the ER if you develop any worsening of your symptoms or have any new concerns.

## 2022-08-18 LAB — URINE CULTURE: Culture: NO GROWTH

## 2022-08-26 ENCOUNTER — Telehealth: Payer: Self-pay

## 2022-08-26 NOTE — Telephone Encounter (Signed)
        Patient  visited [facility] on 1/9    Telephone encounter attempt :  1st  A HIPAA compliant voice message was left requesting a return call.  Instructed patient to call back       [place] [place] [facility]  [phone] [address], [place], [place] [address] Phone: [phone] Email: [email or web address]

## 2022-08-27 ENCOUNTER — Telehealth: Payer: Self-pay

## 2022-08-27 NOTE — Telephone Encounter (Signed)
        Patient  visited [facility] on 1/9     Telephone encounter attempt :  2nd Unable to leave a message     [place], [place] [facility]  [phone] [address], [place], [place] [address] Phone: [phone] Email: [email or web address]

## 2024-03-10 ENCOUNTER — Other Ambulatory Visit: Payer: Self-pay

## 2024-03-10 ENCOUNTER — Emergency Department

## 2024-03-10 ENCOUNTER — Emergency Department
Admission: EM | Admit: 2024-03-10 | Discharge: 2024-03-10 | Disposition: A | Discharge status: Home / Self Care | Attending: Emergency Medicine | Admitting: Emergency Medicine

## 2024-03-10 DIAGNOSIS — I509 Heart failure, unspecified: Secondary | ICD-10-CM | POA: Insufficient documentation

## 2024-03-10 DIAGNOSIS — M25551 Pain in right hip: Secondary | ICD-10-CM | POA: Insufficient documentation

## 2024-03-10 DIAGNOSIS — R319 Hematuria, unspecified: Secondary | ICD-10-CM | POA: Insufficient documentation

## 2024-03-10 HISTORY — DX: Unspecified viral hepatitis C without hepatic coma: B19.20

## 2024-03-10 HISTORY — DX: Essential (primary) hypertension: I10

## 2024-03-10 HISTORY — DX: Heart failure, unspecified: I50.9

## 2024-03-10 HISTORY — DX: Other specified abnormal findings of blood chemistry: R79.89

## 2024-03-10 LAB — URINALYSIS, ROUTINE W REFLEX MICROSCOPIC
Bilirubin Urine: NEGATIVE
Glucose, UA: NEGATIVE mg/dL
Ketones, ur: NEGATIVE mg/dL
Leukocytes,Ua: NEGATIVE
Nitrite: NEGATIVE
Protein, ur: >=300 mg/dL — AB
Specific Gravity, Urine: 1.024 (ref 1.005–1.030)
Squamous Epithelial / HPF: 0 /HPF (ref 0–5)
pH: 5 (ref 5.0–8.0)

## 2024-03-10 LAB — CBC
HCT: 38.9 % — ABNORMAL LOW (ref 39.0–52.0)
Hemoglobin: 13.8 g/dL (ref 13.0–17.0)
MCH: 30.8 pg (ref 26.0–34.0)
MCHC: 35.5 g/dL (ref 30.0–36.0)
MCV: 86.8 fL (ref 80.0–100.0)
Platelets: 168 K/uL (ref 150–400)
RBC: 4.48 MIL/uL (ref 4.22–5.81)
RDW: 12.7 % (ref 11.5–15.5)
WBC: 8 K/uL (ref 4.0–10.5)
nRBC: 0 % (ref 0.0–0.2)

## 2024-03-10 LAB — BASIC METABOLIC PANEL WITH GFR
Anion gap: 8 (ref 5–15)
BUN: 21 mg/dL — ABNORMAL HIGH (ref 6–20)
CO2: 25 mmol/L (ref 22–32)
Calcium: 8.4 mg/dL — ABNORMAL LOW (ref 8.9–10.3)
Chloride: 107 mmol/L (ref 98–111)
Creatinine, Ser: 0.95 mg/dL (ref 0.61–1.24)
GFR, Estimated: >60 mL/min (ref >60)
Glucose, Bld: 102 mg/dL — ABNORMAL HIGH (ref 70–99)
Potassium: 3.8 mmol/L (ref 3.5–5.1)
Sodium: 140 mmol/L (ref 135–145)

## 2024-03-10 MED ORDER — MELOXICAM 15 MG PO TABS: 15.0000 mg | ORAL_TABLET | Freq: Every day | ORAL | 2 refills | Status: AC

## 2024-03-10 MED ORDER — IOHEXOL 300 MG/ML  SOLN
100.0000 mL | Freq: Once | INTRAMUSCULAR | Status: AC | PRN
  Administered 2024-03-10: 100 mL via INTRAVENOUS

## 2024-03-10 NOTE — Discharge Instructions (Addendum)
 Your CT scan today was okay. Please follow up with urology for further evaluation of the blood in urine.

## 2024-03-10 NOTE — ED Provider Notes (Signed)
 [facility] Provider Note   Event Date/Time   First MD Initiated Contact with Patient 03/10/24 1309     (approximate) History  Hematuria  HPI Scott [name] is a 53 y.o. male with a past medical history of polysubstance abuse in remission, chronic low back pain, depression/anxiety, CHF, hepatitis C who presents complaining of hematuria with change in color as well as intermittent difficulty to initiate a stream.  Patient also complains of new right sided anterior hip and thigh pain has been present over the last year after feeling a pop over this area while wrestling with his son.  Patient states he is now having severe pain over the hip and top of the thigh whenever he externally rotates at the right hip.  Patient denies any high back/CVA pain that started with this hematuria.  Patient states that he was told by his primary care physician to follow-up with urology regarding his hematuria however he was unable to get an appointment sooner than 3 months out.  Patient states that he presented today due to the intermittent difficulty to start a urinary stream. ROS: Patient currently denies any vision changes, tinnitus, difficulty speaking, facial droop, sore throat, chest pain, shortness of breath, abdominal pain, nausea/vomiting/diarrhea, or weakness/numbness/paresthesias in any extremity   Physical Exam  Triage Vital Signs: ED Triage Vitals  Encounter Vitals Group     BP 03/10/24 1220 (!) 145/90     Girls Systolic BP Percentile --      Girls Diastolic BP Percentile --      Boys Systolic BP Percentile --      Boys Diastolic BP Percentile --      Pulse Rate 03/10/24 1220 (!) 57     Resp 03/10/24 1220 16     Temp 03/10/24 1220 98.5 F (36.9 C)     Temp Source 03/10/24 1220 Oral     SpO2 03/10/24 1220 92 %     Weight 03/10/24 1225 262 lb (118.8 kg)     Height 03/10/24 1225 5' 11 (1.803 m)     Head Circumference --      Peak Flow --      Pain Score 03/10/24 1221 7      Pain Loc --      Pain Education --      Exclude from Growth Chart --    Most recent vital signs: Vitals:   03/10/24 1220  BP: (!) 145/90  Pulse: (!) 57  Resp: 16  Temp: 98.5 F (36.9 C)  SpO2: 92%   General: Awake, oriented x4. CV:  Good peripheral perfusion. Resp:  Normal effort. Abd:  No distention. Other:  Middle-aged obese Caucasian male resting comfortably in no acute distress ED Results / Procedures / Treatments  Labs (all labs ordered are listed, but only abnormal results are displayed) Labs Reviewed  URINALYSIS, ROUTINE W REFLEX MICROSCOPIC - Abnormal; Notable for the following components:      Result Value   Color, Urine YELLOW (*)    APPearance CLEAR (*)    Hgb urine dipstick LARGE (*)    Protein, ur >=300 (*)    Bacteria, UA RARE (*)    All other components within normal limits  BASIC METABOLIC PANEL WITH GFR - Abnormal; Notable for the following components:   Glucose, Bld 102 (*)    BUN 21 (*)    Calcium  8.4 (*)    All other components within normal limits  CBC - Abnormal; Notable for the following components:  HCT 38.9 (*)    All other components within normal limits   RADIOLOGY ED MD interpretation: CT abdomen/pelvis pending - All radiology independently interpreted and agree with radiology assessment Official radiology report(s): CT ABDOMEN PELVIS W CONTRAST Result Date: 03/10/2024 CLINICAL DATA:  Bilateral lower back pain/flank pain 1 month with hematuria. Suspect renal stone. EXAM: CT ABDOMEN AND PELVIS WITH CONTRAST TECHNIQUE: Multidetector CT imaging of the abdomen and pelvis was performed using the standard protocol following bolus administration of intravenous contrast. RADIATION DOSE REDUCTION: This exam was performed according to the departmental dose-optimization program which includes automated exposure control, adjustment of the mA and/or kV according to patient size and/or use of iterative reconstruction technique. CONTRAST:   OMNIPAQUE  IOHEXOL  300 MG/ML  SOLN COMPARISON:  08/17/2022 . FINDINGS: Lower chest: Heart is normal size.  Visualized lung bases are clear. Hepatobiliary: Liver, gallbladder and biliary tree are normal. Pancreas: Mild fatty atrophy of the pancreas which is otherwise unremarkable. Spleen: Normal. Adrenals/Urinary Tract: Adrenal glands are normal. Kidneys are normal in size without hydronephrosis or nephrolithiasis. Ureters and bladder are normal. Stomach/Bowel: Stomach and small bowel are normal. Previous appendectomy. Colon is normal. Vascular/Lymphatic: Abdominal aorta is normal in caliber with minimal calcified plaque distally. Remaining vascular structures are unremarkable. No adenopathy. Reproductive: Normal. Other: No free fluid or focal inflammatory change. Musculoskeletal: Minimal spondylosis of the lumbar spine with mild multilevel disc disease. IMPRESSION: 1. No acute findings in the abdomen/pelvis. No renal/ureteral stones or obstruction. 2. Aortic atherosclerosis. Aortic Atherosclerosis (ICD10-I70.0). Electronically Signed   By: Toribio Agreste M.D.   On: 03/10/2024 17:05   PROCEDURES: Critical Care performed: No Procedures MEDICATIONS ORDERED IN ED: Medications  iohexol  (OMNIPAQUE ) 300 MG/ML solution 100 mL (100 mLs Intravenous Contrast Given 03/10/24 1649)   IMPRESSION / MDM / ASSESSMENT AND PLAN / ED COURSE  I reviewed the triage vital signs and the nursing notes.                             The patient is on the cardiac monitor to evaluate for evidence of arrhythmia and/or significant heart rate changes. Patient's presentation is most consistent with acute presentation with potential threat to life or bodily function. Pt is a 53 year old male who presents for gross hematuria Workup: UA Findings: UA without evidence of infection. VSS and patient in NAD, afebrile.  Patient's history, exam, and studies ordered are not consistent with kidney stone, urethral or intra-abdominal trauma, drug  reaction, coagulopathy, prostatitis or other serious bacterial infection. Hematuria ML 2/2 BPH without evidence of infection.  Disposition: Care of this patient will be signed out to the oncoming physician at the end of my shift.  All pertinent patient information conveyed and all questions answered.  All further care and disposition decisions will be made by the oncoming physician.   FINAL CLINICAL IMPRESSION(S) / ED DIAGNOSES   Final diagnoses:  Hematuria, unspecified type  Right hip pain   Rx / DC Orders   ED Discharge Orders          Ordered    meloxicam  (MOBIC ) 15 MG tablet  Daily        03/10/24 1413           Note:  This document was prepared using Dragon voice recognition software and may include unintentional dictation errors.   Jossie Artist POUR, MD 03/11/24 [phone]

## 2024-03-10 NOTE — ED Notes (Addendum)
 Pt to CT

## 2024-03-10 NOTE — ED Notes (Signed)
 This RN and Rosina attempted x2 to place Iv

## 2024-03-10 NOTE — ED Triage Notes (Signed)
 Pt to ED for hematuria since 1 month or less. Sent by PCP for urology consult. Pt also having pain to bilateral lower back pain since about 1 month ago. Also feels pressure and throbbing down R leg. Not in groin. No leg swelling.
